# Patient Record
Sex: Female | Born: 1955 | Race: White | Hispanic: No | Smoking: Never smoker
Health system: Southern US, Community
[De-identification: ages and names within clinical notes are randomized; demographics above are authoritative.]

## PROBLEM LIST (undated history)

## (undated) DIAGNOSIS — N814 Uterovaginal prolapse, unspecified: Secondary | ICD-10-CM

## (undated) DIAGNOSIS — G4730 Sleep apnea, unspecified: Secondary | ICD-10-CM

## (undated) DIAGNOSIS — K219 Gastro-esophageal reflux disease without esophagitis: Secondary | ICD-10-CM

## (undated) DIAGNOSIS — E119 Type 2 diabetes mellitus without complications: Secondary | ICD-10-CM

## (undated) DIAGNOSIS — F411 Generalized anxiety disorder: Secondary | ICD-10-CM

## (undated) DIAGNOSIS — G5603 Carpal tunnel syndrome, bilateral upper limbs: Secondary | ICD-10-CM

## (undated) DIAGNOSIS — E785 Hyperlipidemia, unspecified: Secondary | ICD-10-CM

## (undated) DIAGNOSIS — I10 Essential (primary) hypertension: Secondary | ICD-10-CM

## (undated) DIAGNOSIS — Z8719 Personal history of other diseases of the digestive system: Secondary | ICD-10-CM

## (undated) DIAGNOSIS — M19072 Primary osteoarthritis, left ankle and foot: Secondary | ICD-10-CM

## (undated) DIAGNOSIS — D649 Anemia, unspecified: Secondary | ICD-10-CM

## (undated) HISTORY — PX: ANKLE FUSION: SHX881

## (undated) HISTORY — DX: Anemia, unspecified: D64.9

## (undated) HISTORY — DX: Morbid (severe) obesity due to excess calories: E66.01

## (undated) HISTORY — PX: FOOT SURGERY: SHX648

## (undated) HISTORY — DX: Carpal tunnel syndrome, bilateral upper limbs: G56.03

## (undated) HISTORY — DX: Primary osteoarthritis, left ankle and foot: M19.072

## (undated) HISTORY — DX: Essential (primary) hypertension: I10

## (undated) HISTORY — DX: Generalized anxiety disorder: F41.1

## (undated) HISTORY — DX: Hyperlipidemia, unspecified: E78.5

## (undated) HISTORY — PX: EYE SURGERY: SHX253

## (undated) HISTORY — DX: Uterovaginal prolapse, unspecified: N81.4

## (undated) HISTORY — DX: Gastro-esophageal reflux disease without esophagitis: K21.9

---

## 1997-03-09 ENCOUNTER — Ambulatory Visit (HOSPITAL_COMMUNITY): Admission: RE | Admit: 1997-03-09 | Discharge: 1997-03-09 | Payer: Self-pay | Admitting: Obstetrics and Gynecology

## 1998-03-03 ENCOUNTER — Ambulatory Visit (HOSPITAL_COMMUNITY): Admission: RE | Admit: 1998-03-03 | Discharge: 1998-03-03 | Payer: Self-pay | Admitting: Obstetrics and Gynecology

## 1998-03-03 ENCOUNTER — Encounter: Payer: Self-pay | Admitting: Obstetrics and Gynecology

## 1998-03-29 ENCOUNTER — Other Ambulatory Visit: Admission: RE | Admit: 1998-03-29 | Discharge: 1998-03-29 | Payer: Self-pay | Admitting: Obstetrics and Gynecology

## 1999-03-08 ENCOUNTER — Encounter: Payer: Self-pay | Admitting: Obstetrics and Gynecology

## 1999-03-08 ENCOUNTER — Ambulatory Visit (HOSPITAL_COMMUNITY): Admission: RE | Admit: 1999-03-08 | Discharge: 1999-03-08 | Payer: Self-pay | Admitting: Obstetrics and Gynecology

## 1999-05-23 ENCOUNTER — Other Ambulatory Visit: Admission: RE | Admit: 1999-05-23 | Discharge: 1999-05-23 | Payer: Self-pay | Admitting: *Deleted

## 1999-06-14 ENCOUNTER — Other Ambulatory Visit: Admission: RE | Admit: 1999-06-14 | Discharge: 1999-06-14 | Payer: Self-pay | Admitting: *Deleted

## 1999-06-15 ENCOUNTER — Other Ambulatory Visit: Admission: RE | Admit: 1999-06-15 | Discharge: 1999-06-15 | Payer: Self-pay | Admitting: *Deleted

## 1999-06-15 ENCOUNTER — Encounter (INDEPENDENT_AMBULATORY_CARE_PROVIDER_SITE_OTHER): Payer: Self-pay

## 1999-12-30 LAB — HM COLONOSCOPY: HM Colonoscopy: ABNORMAL

## 2000-01-06 ENCOUNTER — Encounter (INDEPENDENT_AMBULATORY_CARE_PROVIDER_SITE_OTHER): Payer: Self-pay | Admitting: Specialist

## 2000-01-06 ENCOUNTER — Other Ambulatory Visit: Admission: RE | Admit: 2000-01-06 | Discharge: 2000-01-06 | Payer: Self-pay | Admitting: Internal Medicine

## 2000-03-14 ENCOUNTER — Encounter: Payer: Self-pay | Admitting: Obstetrics and Gynecology

## 2000-03-14 ENCOUNTER — Ambulatory Visit (HOSPITAL_COMMUNITY): Admission: RE | Admit: 2000-03-14 | Discharge: 2000-03-14 | Payer: Self-pay | Admitting: Obstetrics and Gynecology

## 2000-05-10 ENCOUNTER — Other Ambulatory Visit: Admission: RE | Admit: 2000-05-10 | Discharge: 2000-05-10 | Payer: Self-pay | Admitting: Obstetrics and Gynecology

## 2001-04-03 ENCOUNTER — Ambulatory Visit (HOSPITAL_COMMUNITY): Admission: RE | Admit: 2001-04-03 | Discharge: 2001-04-03 | Payer: Self-pay | Admitting: Obstetrics and Gynecology

## 2001-04-03 ENCOUNTER — Encounter: Payer: Self-pay | Admitting: Obstetrics and Gynecology

## 2002-04-15 ENCOUNTER — Encounter: Payer: Self-pay | Admitting: Obstetrics and Gynecology

## 2002-04-15 ENCOUNTER — Encounter: Admission: RE | Admit: 2002-04-15 | Discharge: 2002-04-15 | Payer: Self-pay | Admitting: Obstetrics and Gynecology

## 2003-04-28 ENCOUNTER — Ambulatory Visit (HOSPITAL_COMMUNITY): Admission: RE | Admit: 2003-04-28 | Discharge: 2003-04-28 | Payer: Self-pay | Admitting: Obstetrics and Gynecology

## 2004-02-08 ENCOUNTER — Ambulatory Visit: Payer: Self-pay | Admitting: Internal Medicine

## 2004-02-12 ENCOUNTER — Ambulatory Visit: Payer: Self-pay | Admitting: Internal Medicine

## 2004-04-12 ENCOUNTER — Ambulatory Visit: Payer: Self-pay | Admitting: Internal Medicine

## 2004-05-13 ENCOUNTER — Ambulatory Visit (HOSPITAL_COMMUNITY): Admission: RE | Admit: 2004-05-13 | Discharge: 2004-05-13 | Payer: Self-pay | Admitting: Obstetrics and Gynecology

## 2005-05-05 ENCOUNTER — Ambulatory Visit: Payer: Self-pay | Admitting: Internal Medicine

## 2006-03-08 ENCOUNTER — Ambulatory Visit: Payer: Self-pay | Admitting: Internal Medicine

## 2006-03-08 LAB — CONVERTED CEMR LAB
ALT: 18 units/L (ref 0–40)
AST: 19 units/L (ref 0–37)
Albumin: 3.1 g/dL — ABNORMAL LOW (ref 3.5–5.2)
Alkaline Phosphatase: 73 units/L (ref 39–117)
BUN: 10 mg/dL (ref 6–23)
Basophils Absolute: 0 10*3/uL (ref 0.0–0.1)
Basophils Relative: 0.5 % (ref 0.0–1.0)
Bilirubin Urine: NEGATIVE
Bilirubin, Direct: 0.2 mg/dL (ref 0.0–0.3)
CO2: 28 meq/L (ref 19–32)
Calcium: 8.9 mg/dL (ref 8.4–10.5)
Chloride: 110 meq/L (ref 96–112)
Cholesterol: 148 mg/dL (ref 0–200)
Creatinine, Ser: 0.7 mg/dL (ref 0.4–1.2)
Crystals: NEGATIVE
Eosinophils Absolute: 0.1 10*3/uL (ref 0.0–0.6)
Eosinophils Relative: 2.1 % (ref 0.0–5.0)
Ferritin: 7.3 ng/mL — ABNORMAL LOW (ref 10.0–291.0)
GFR calc Af Amer: 114 mL/min
GFR calc non Af Amer: 94 mL/min
Glucose, Bld: 102 mg/dL — ABNORMAL HIGH (ref 70–99)
HCT: 34.2 % — ABNORMAL LOW (ref 36.0–46.0)
HDL: 42.2 mg/dL (ref >39.0)
Hemoglobin: 11.2 g/dL — ABNORMAL LOW (ref 12.0–15.0)
Iron: 24 ug/dL — ABNORMAL LOW (ref 42–145)
Ketones, ur: NEGATIVE mg/dL
LDL Cholesterol: 88 mg/dL (ref 0–99)
Leukocytes, UA: NEGATIVE
Lymphocytes Relative: 21.7 % (ref 12.0–46.0)
MCHC: 32.7 g/dL (ref 30.0–36.0)
MCV: 72.1 fL — ABNORMAL LOW (ref 78.0–100.0)
Monocytes Absolute: 0.5 10*3/uL (ref 0.2–0.7)
Monocytes Relative: 7.8 % (ref 3.0–11.0)
Mucus, UA: NEGATIVE
Neutro Abs: 4.9 10*3/uL (ref 1.4–7.7)
Neutrophils Relative %: 67.9 % (ref 43.0–77.0)
Nitrite: NEGATIVE
Platelets: 382 10*3/uL (ref 150–400)
Potassium: 4.3 meq/L (ref 3.5–5.1)
RBC: 4.74 M/uL (ref 3.87–5.11)
RDW: 17.3 % — ABNORMAL HIGH (ref 11.5–14.6)
Saturation Ratios: 6.5 % — ABNORMAL LOW (ref 20.0–50.0)
Sodium: 141 meq/L (ref 135–145)
Specific Gravity, Urine: 1.015 (ref 1.000–1.03)
TSH: 2.74 microintl units/mL (ref 0.35–5.50)
Total Bilirubin: 0.8 mg/dL (ref 0.3–1.2)
Total CHOL/HDL Ratio: 3.5
Total Protein: 6.2 g/dL (ref 6.0–8.3)
Transferrin: 263 mg/dL (ref >=212.0)
Triglycerides: 87 mg/dL (ref 0–149)
Urine Glucose: NEGATIVE mg/dL
Urobilinogen, UA: 0.2 (ref 0.0–1.0)
VLDL: 17 mg/dL (ref 0–40)
WBC: 7 10*3/uL (ref 4.5–10.5)
pH: 7.5 (ref 5.0–8.0)

## 2006-03-12 ENCOUNTER — Ambulatory Visit: Payer: Self-pay | Admitting: Internal Medicine

## 2006-04-04 ENCOUNTER — Ambulatory Visit: Payer: Self-pay | Admitting: Internal Medicine

## 2006-04-11 ENCOUNTER — Encounter (INDEPENDENT_AMBULATORY_CARE_PROVIDER_SITE_OTHER): Payer: Self-pay | Admitting: Specialist

## 2006-04-11 ENCOUNTER — Ambulatory Visit: Payer: Self-pay | Admitting: Internal Medicine

## 2006-11-13 DIAGNOSIS — K219 Gastro-esophageal reflux disease without esophagitis: Secondary | ICD-10-CM | POA: Insufficient documentation

## 2006-11-13 DIAGNOSIS — I10 Essential (primary) hypertension: Secondary | ICD-10-CM | POA: Insufficient documentation

## 2006-11-13 DIAGNOSIS — D509 Iron deficiency anemia, unspecified: Secondary | ICD-10-CM | POA: Insufficient documentation

## 2006-11-13 HISTORY — DX: Essential (primary) hypertension: I10

## 2007-05-01 ENCOUNTER — Telehealth: Payer: Self-pay | Admitting: Internal Medicine

## 2007-05-14 ENCOUNTER — Ambulatory Visit: Payer: Self-pay | Admitting: Internal Medicine

## 2007-05-14 DIAGNOSIS — J069 Acute upper respiratory infection, unspecified: Secondary | ICD-10-CM | POA: Insufficient documentation

## 2007-10-30 ENCOUNTER — Ambulatory Visit: Payer: Self-pay | Admitting: Internal Medicine

## 2007-10-30 DIAGNOSIS — R5383 Other fatigue: Secondary | ICD-10-CM

## 2007-10-30 DIAGNOSIS — R5381 Other malaise: Secondary | ICD-10-CM | POA: Insufficient documentation

## 2007-10-30 LAB — CONVERTED CEMR LAB
ALT: 12 units/L (ref 0–35)
AST: 14 units/L (ref 0–37)
Albumin: 3.6 g/dL (ref 3.5–5.2)
Alkaline Phosphatase: 67 units/L (ref 39–117)
BUN: 19 mg/dL (ref 6–23)
Basophils Absolute: 0 10*3/uL (ref 0.0–0.1)
Basophils Relative: 0.2 % (ref 0.0–3.0)
Bilirubin Urine: NEGATIVE
Bilirubin, Direct: 0.2 mg/dL (ref 0.0–0.3)
CO2: 29 meq/L (ref 19–32)
Calcium: 9 mg/dL (ref 8.4–10.5)
Chloride: 109 meq/L (ref 96–112)
Creatinine, Ser: 0.8 mg/dL (ref 0.4–1.2)
Crystals: NEGATIVE
Eosinophils Absolute: 0.2 10*3/uL (ref 0.0–0.7)
Eosinophils Relative: 2.2 % (ref 0.0–5.0)
GFR calc Af Amer: 97 mL/min
GFR calc non Af Amer: 80 mL/min
Glucose, Bld: 98 mg/dL (ref 70–99)
HCT: 45.3 % (ref 36.0–46.0)
Hemoglobin, Urine: NEGATIVE
Hemoglobin: 15.2 g/dL — ABNORMAL HIGH (ref 12.0–15.0)
Ketones, ur: NEGATIVE mg/dL
Lymphocytes Relative: 29.3 % (ref 12.0–46.0)
MCHC: 33.6 g/dL (ref 30.0–36.0)
MCV: 91.6 fL (ref 78.0–100.0)
Monocytes Absolute: 0.5 10*3/uL (ref 0.1–1.0)
Monocytes Relative: 7.1 % (ref 3.0–12.0)
Mucus, UA: NEGATIVE
Neutro Abs: 4.7 10*3/uL (ref 1.4–7.7)
Neutrophils Relative %: 61.2 % (ref 43.0–77.0)
Nitrite: NEGATIVE
Platelets: 291 10*3/uL (ref 150–400)
Potassium: 3.8 meq/L (ref 3.5–5.1)
RBC / HPF: NONE SEEN
RBC: 4.95 M/uL (ref 3.87–5.11)
RDW: 13.3 % (ref 11.5–14.6)
Sodium: 144 meq/L (ref 135–145)
Specific Gravity, Urine: 1.02 (ref 1.000–1.03)
TSH: 2.14 microintl units/mL (ref 0.35–5.50)
Total Bilirubin: 1.1 mg/dL (ref 0.3–1.2)
Total Protein, Urine: NEGATIVE mg/dL
Total Protein: 6.7 g/dL (ref 6.0–8.3)
Urine Glucose: NEGATIVE mg/dL
Urobilinogen, UA: 1 (ref 0.0–1.0)
WBC: 7.7 10*3/uL (ref 4.5–10.5)
pH: 6.5 (ref 5.0–8.0)

## 2007-11-04 ENCOUNTER — Telehealth (INDEPENDENT_AMBULATORY_CARE_PROVIDER_SITE_OTHER): Payer: Self-pay | Admitting: *Deleted

## 2009-07-22 ENCOUNTER — Ambulatory Visit: Payer: Self-pay | Admitting: Internal Medicine

## 2009-07-22 LAB — CONVERTED CEMR LAB
BUN: 17 mg/dL (ref 6–23)
Calcium: 8.9 mg/dL (ref 8.4–10.5)
Creatinine, Ser: 0.9 mg/dL (ref 0.4–1.2)
GFR calc non Af Amer: 68.46 mL/min (ref >60)
Glucose, Bld: 101 mg/dL — ABNORMAL HIGH (ref 70–99)

## 2010-02-24 NOTE — Assessment & Plan Note (Signed)
Summary: ROV-MED REFILL-LAST APPT W/DR JWJ:2009-STC   Vital Signs:  Patient profile:   55 year old female Height:      66 inches Weight:      348.25 pounds BMI:     56.41 O2 Sat:      97 % on Room air Temp:     98.2 degrees F oral Pulse rate:   88 / minute BP sitting:   148 / 92  (left arm) Cuff size:   large  Vitals Entered By: Zella Ball Ewing CMA Duncan Dull) (July 22, 2009 2:34 PM)  O2 Flow:  Room air  CC: ROV, Medication refills/RE   Primary Care Provider:  Oliver Barre  CC:  ROV and Medication refills/RE.  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  Wt still elevated, hard to lose.  Denies OSA symptoms.  Pt denies CP, sob, doe, wheezing, orthopnea, pnd, worsening LE edema, palps, dizziness or syncope  Pt denies new neuro symptoms such as headache, facial or extremity weakness    Out of meds for several days.  BP at home usualy < 140/90  Problems Prior to Update: 1)  Fatigue  (ICD-780.79) 2)  Uri  (ICD-465.9) 3)  Hypertension  (ICD-401.9) 4)  Anemia-iron Deficiency  (ICD-280.9) 5)  Gerd  (ICD-530.81) 6)  Morbid Obesity  (ICD-278.01)  Medications Prior to Update: 1)  Benazepril Hcl 20 Mg  Tabs (Benazepril Hcl) .... Take 1 Tablet By Mouth Once A Day 2)  Omeprazole 20 Mg Tbec (Omeprazole) .[name].. 1 By Mouth Once Daily 3)  Klor-Con M10 10 Meq Cr-Tabs (Potassium Chloride Crys Cr) .[name].. 1 By Mouth Once Daily 4)  Furosemide 40 Mg  Tabs (Furosemide) .... Once A Week As Needed 5)  Adult Aspirin Ec Low Strength 81 Mg Tbec (Aspirin) .[name].. 1 By Mouth Once Daily  Current Medications (verified): 1)  Benazepril Hcl 20 Mg  Tabs (Benazepril Hcl) .... Take 1 Tablet By Mouth Once A Day 2)  Omeprazole 20 Mg Tbec (Omeprazole) .[name].. 1 By Mouth Once Daily 3)  Klor-Con M10 10  Meq Cr-Tabs (Potassium Chloride Crys Cr) .[name].. 1 By Mouth Once Daily 4)  Furosemide 40 Mg  Tabs (Furosemide) .[name].. 1 By Mouth Once Daily As Needed 5)  Adult Aspirin Ec Low Strength 81 Mg Tbec (Aspirin) .[name].. 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   Head:  normocephalic and atraumatic.   Eyes:  vision grossly intact, pupils equal, and pupils round.   Ears:  R ear normal and L ear normal.   Nose:  no external deformity and no nasal discharge.   Mouth:  no gingival abnormalities and pharynx pink and moist.   Neck:  supple and no masses.  Lungs:  normal respiratory effort and normal breath sounds.   Heart:  normal rate and regular rhythm.   Abdomen:  soft, non-tender, and normal bowel sounds.   Msk:  no joint tenderness and no joint swelling.   Extremities:  no edema, no erythema  Neurologic:  alert & oriented X3 and strength normal in all extremities.   Skin:  color normal and no rashes.   Psych:  not depressed appearing and slightly anxious.     Impression & Recommendations:  Problem # 1:  HYPERTENSION (ICD-401.9)  Her updated medication list for this problem includes:    Benazepril Hcl 20 Mg Tabs (Benazepril hcl) .[name]... Take 1 tablet by mouth once a day    Furosemide 40 Mg Tabs (Furosemide) .[name]... 1 by mouth once  daily as needed  Orders: TLB-BMP (Basic Metabolic Panel-BMET) (80048-METABOL)  BP today: 148/92 Prior BP: 140/92 (10/30/2007)  Labs Reviewed: K+: 3.8 (10/30/2007) Creat: : 0.8 (10/30/2007)   Chol: 148 (03/08/2006)   HDL: 42.2 (03/08/2006)   LDL: 88 (03/08/2006)   TG: 87 (03/08/2006) to re-start meds, .Continue all previous medications as before this visit,  , cont to check BP at home carefully, check BMP today  Complete Medication List: 1)  Benazepril Hcl 20 Mg Tabs (Benazepril hcl) .... Take 1 tablet by mouth once a day 2)  Omeprazole 20 Mg Tbec (Omeprazole) .[name].. 1 by mouth once daily 3)  Klor-con M10 10 Meq Cr-tabs (Potassium chloride crys cr) .[name].. 1 by mouth once daily 4)  Furosemide 40 Mg Tabs (Furosemide) .[name].. 1 by mouth once daily as needed 5)  Adult Aspirin Ec Low Strength 81 Mg Tbec (Aspirin) .[name].. 1 by mouth once daily  Patient Instructions: 1)  please call for yearly mammogram  - consider [facility] on [address] 2)  Please go to the Lab in the basement for your blood and/or urine tests today  3)  Continue all previous medications as before this visit  4)  Please schedule a follow-up appointment in 1 year or sooner if needed Prescriptions: KLOR-CON M10 10 MEQ CR-TABS (POTASSIUM CHLORIDE CRYS CR) 1 by mouth once daily  #90 x 3   Entered and Authorized by:   Corwin Levins MD   Signed by:   Corwin Levins MD on 07/22/2009   Method used:   Electronically to        [id number]* (retail)       [address]       [place], [place]  [address]       Ph: [phone]       Fax: [phone]   RxID:   [id number] OMEPRAZOLE 20 MG TBEC (OMEPRAZOLE) 1 by mouth once daily  #90 x 3   Entered and Authorized by:   Corwin Levins MD   Signed by:   Corwin Levins MD on 07/22/2009   Method used:   Electronically to        [id number]* (retail)       [address]       [place], [place]  [address]       Ph: [phone]       Fax:  [phone]   RxID:   [id number] BENAZEPRIL HCL 20 MG  TABS (BENAZEPRIL HCL) Take 1 tablet by mouth once a day  #90 x 3   Entered and Authorized by:   Corwin Levins MD   Signed by:   Corwin Levins MD  on 07/22/2009   Method used:   Electronically to        [facility] [address]* (retail)       [address]       [place], [place]  [address]       Ph: [phone]       Fax: [phone]   RxID:   [id number] FUROSEMIDE 40 MG  TABS (FUROSEMIDE) 1 by mouth once daily as needed  #90 x 3   Entered and Authorized by:   Corwin Levins MD   Signed by:   Corwin Levins MD on 07/22/2009   Method used:   Electronically to        [facility] [address] (retail)       [address]       [place], [place]  [address]       Ph: [phone]       Fax: [phone]   RxID:   [id number]

## 2010-06-10 NOTE — Assessment & Plan Note (Signed)
[facility]                         GASTROENTEROLOGY OFFICE NOTE   Jodi, Keller                         MRN:          [id number]  DATE:04/04/2006                            DOB:          1955-04-06    REASON FOR CONSULTATION:  Iron deficiency anemia.   HISTORY:  This is a 55 year old white female with history of  gastroesophageal reflux disease and morbid obesity, and hypertension,  who is referred through the courtesy of Dr. Jonny Ruiz regarding iron  deficiency anemia.  The patient was evaluated in October of 2001 for  iron deficiency anemia.  At that time, hemoccult studies were negative.  Her symptoms were felt secondary to menstruation.  She did, however,  undergo colonoscopy and upper endoscopy.  Colonoscopy performed December 30, 1999, was normal with grade 1 internal hemorrhoids noted.  Upper  endoscopy revealed reflux esophagitis and a peptic stricture.  Biopsies  of the duodenum were negative for sprue.  She was placed on Nexium and  iron.  Her blood counts improved as documented in the chart, with more  normal hemoglobins in the 13 range.  However, over the past few years,  she has had mild anemia with a hemoglobin in the 11 range.  On March 08, 2006, her hemoglobin was 11.2.  MCV 72.1.  Iron studies were  consistent with iron deficiency with a ferritin level of 7.3 and an iron  saturation of 6.5%.  She is now referred.  The patient continues on  Nexium daily.  On the medication, she has no problems with heartburn or  dysphagia.  No abdominal pain.  She denies change in her bowel habits,  melena, hematochezia, or weight loss.   PAST MEDICAL HISTORY:  None.   PAST SURGICAL HISTORY:  1. Caesarean section.  2. Left foot surgery.   CURRENT MEDICATIONS:  1. Diovan.  2. Nexium.  3. Lasix.  4. Potassium.  5. Iron supplement.   ALLERGIES:  NO KNOWN DRUG ALLERGIES.   FAMILY HISTORY:  No family history of gastrointestinal  malignancy.   SOCIAL HISTORY:  The patient is divorced with 2 children.  She is  employed with [employer].  She does not smoke and occasionally uses  alcohol.   REVIEW OF SYSTEMS:  Per diagnostic evaluation form.   PHYSICAL EXAMINATION:  Well-appearing female in no acute distress.  Blood pressure is 138/78, heart rate is 72 and regular, weight is 334  pounds.  She is 5 feet 6 inches in height.  HEENT:  Sclerae anicteric.  Conjunctivae are pink.  Oral mucosa intact.  No adenopathy.  Lungs are clear.  Heart is regular.  ABDOMEN:  Obese and soft without tenderness, mass, or hernia.  Good  bowel sounds heard.   IMPRESSION:  1. Recurrent iron deficiency anemia.  Most likely due to chronic blood      loss from menstruation.  2. Gastroesophageal reflux disease.  History of erosive esophagitis      and stricture on endoscopy in 2001.  Currently asymptomatic on      Nexium.  3. Negative colonoscopy in 2001.   RECOMMENDATIONS:  1. Continue Nexium for reflux symptoms.  2. Continue iron for iron deficiency anemia.  3. Schedule colonoscopy to provide neoplasia screening in this patient      with iron deficiency.  Ashby Dawes of the procedures as well as the      risks, benefits, and alternatives have been reviewed.  She      understood and agreed to proceed.     Wilhemina Bonito. Marina Goodell, MD  Electronically Signed    JNP/MedQ  DD: 04/04/2006  DT: 04/06/2006  Job #: [id number]   cc:   Corwin Levins, MD

## 2010-08-20 ENCOUNTER — Other Ambulatory Visit: Payer: Self-pay | Admitting: Internal Medicine

## 2010-10-10 ENCOUNTER — Other Ambulatory Visit: Payer: Self-pay | Admitting: Internal Medicine

## 2010-11-17 ENCOUNTER — Other Ambulatory Visit: Payer: Self-pay | Admitting: Internal Medicine

## 2010-11-18 NOTE — Telephone Encounter (Signed)
Called the patient informed needs to schedule OV as is overdue. The patient agreed and did schedule appointment.

## 2010-12-10 ENCOUNTER — Encounter: Payer: Self-pay | Admitting: Internal Medicine

## 2010-12-10 DIAGNOSIS — Z0000 Encounter for general adult medical examination without abnormal findings: Secondary | ICD-10-CM | POA: Insufficient documentation

## 2010-12-10 DIAGNOSIS — Z1211 Encounter for screening for malignant neoplasm of colon: Secondary | ICD-10-CM | POA: Insufficient documentation

## 2010-12-16 ENCOUNTER — Encounter: Payer: Self-pay | Admitting: Internal Medicine

## 2010-12-16 ENCOUNTER — Other Ambulatory Visit (INDEPENDENT_AMBULATORY_CARE_PROVIDER_SITE_OTHER): Payer: BC Managed Care – PPO

## 2010-12-16 ENCOUNTER — Ambulatory Visit (INDEPENDENT_AMBULATORY_CARE_PROVIDER_SITE_OTHER): Payer: BC Managed Care – PPO | Admitting: Internal Medicine

## 2010-12-16 VITALS — BP 138/90 | HR 82 | Temp 98.1°F | Wt 337.2 lb

## 2010-12-16 DIAGNOSIS — Z0000 Encounter for general adult medical examination without abnormal findings: Secondary | ICD-10-CM

## 2010-12-16 DIAGNOSIS — M19079 Primary osteoarthritis, unspecified ankle and foot: Secondary | ICD-10-CM | POA: Insufficient documentation

## 2010-12-16 DIAGNOSIS — G5603 Carpal tunnel syndrome, bilateral upper limbs: Secondary | ICD-10-CM

## 2010-12-16 DIAGNOSIS — M19072 Primary osteoarthritis, left ankle and foot: Secondary | ICD-10-CM

## 2010-12-16 HISTORY — DX: Carpal tunnel syndrome, bilateral upper limbs: G56.03

## 2010-12-16 HISTORY — DX: Primary osteoarthritis, left ankle and foot: M19.072

## 2010-12-16 LAB — LIPID PANEL
HDL: 56 mg/dL (ref >39.00)
VLDL: 12.8 mg/dL (ref 0.0–40.0)

## 2010-12-16 LAB — CBC WITH DIFFERENTIAL/PLATELET
Basophils Relative: 0.4 % (ref 0.0–3.0)
Eosinophils Absolute: 0.2 10*3/uL (ref 0.0–0.7)
Eosinophils Relative: 2.5 % (ref 0.0–5.0)
HCT: 44.9 % (ref 36.0–46.0)
Lymphs Abs: 1.7 10*3/uL (ref 0.7–4.0)
MCHC: 33.1 g/dL (ref 30.0–36.0)
MCV: 92.9 fl (ref 78.0–100.0)
Monocytes Absolute: 0.5 10*3/uL (ref 0.1–1.0)
Neutrophils Relative %: 64.5 % (ref 43.0–77.0)
Platelets: 237 10*3/uL (ref 150.0–400.0)
RBC: 4.84 Mil/uL (ref 3.87–5.11)
WBC: 6.6 10*3/uL (ref 4.5–10.5)

## 2010-12-16 LAB — URINALYSIS, ROUTINE W REFLEX MICROSCOPIC
Bilirubin Urine: NEGATIVE
Ketones, ur: NEGATIVE
Total Protein, Urine: NEGATIVE
Urine Glucose: NEGATIVE
pH: 6 (ref 5.0–8.0)

## 2010-12-16 LAB — BASIC METABOLIC PANEL
BUN: 17 mg/dL (ref 6–23)
Calcium: 8.7 mg/dL (ref 8.4–10.5)
Creatinine, Ser: 0.8 mg/dL (ref 0.4–1.2)
GFR: 83.84 mL/min (ref >60.00)
Glucose, Bld: 95 mg/dL (ref 70–99)
Sodium: 143 mEq/L (ref 135–145)

## 2010-12-16 LAB — HEPATIC FUNCTION PANEL
ALT: 16 U/L (ref 0–35)
AST: 19 U/L (ref 0–37)
Bilirubin, Direct: 0.2 mg/dL (ref 0.0–0.3)
Total Bilirubin: 0.9 mg/dL (ref 0.3–1.2)

## 2010-12-16 MED ORDER — BENAZEPRIL HCL 20 MG PO TABS: 20.0000 mg | ORAL_TABLET | Freq: Every day | ORAL | Status: DC

## 2010-12-16 MED ORDER — NAPROXEN 500 MG PO TABS: 500.0000 mg | ORAL_TABLET | Freq: Two times a day (BID) | ORAL | Status: DC

## 2010-12-16 MED ORDER — FUROSEMIDE 40 MG PO TABS: 40.0000 mg | ORAL_TABLET | Freq: Every day | ORAL | Status: DC

## 2010-12-16 MED ORDER — OMEPRAZOLE 20 MG PO CPDR: 20.0000 mg | DELAYED_RELEASE_CAPSULE | Freq: Every day | ORAL | Status: DC

## 2010-12-16 MED ORDER — POTASSIUM CHLORIDE CRYS ER 10 MEQ PO TBCR: 10.0000 meq | EXTENDED_RELEASE_TABLET | Freq: Every day | ORAL | Status: DC

## 2010-12-16 NOTE — Patient Instructions (Addendum)
You had the flu shot today Please remember to followup with your GYN for the yearly pap smear and/or mammogram Take all new medications as prescribed Continue all other medications as before You will be contacted regarding the referral for: orthopedic for the left ankle Please go to LAB in the Basement for the blood and/or urine tests to be done today Please call the phone number [phone] (the PhoneTree System) for results of testing in 2-3 days;  When calling, simply dial the number, and when prompted enter the MRN number above (the Medical Record Number) and the # key, then the message should start. Please return in 1 year for your yearly visit, or sooner if needed, with Lab testing done 3-5 days before

## 2010-12-16 NOTE — Progress Notes (Signed)
Subjective:    Patient ID: Jodi Keller, female    DOB: Dec 22, 1955, 55 y.o.   MRN: [id number]  HPI  Here for wellness and f/u;  Overall doing ok;  Pt denies CP, worsening SOB, DOE, wheezing, orthopnea, PND, worsening LE edema, palpitations, dizziness or syncope.  Pt denies neurological change such as new Headache, facial or extremity weakness.  Pt denies polydipsia, polyuria, or low sugar symptoms. Pt states overall good compliance with treatment and medications, good tolerability, and trying to follow lower cholesterol diet.  Pt denies worsening depressive symptoms, suicidal ideation or panic. No fever, wt loss, night sweats, loss of appetite, or other constitutional symptoms.  Pt states good ability with ADL's, low fall risk, home safety reviewed and adequate, no significant changes in hearing or vision, and occasionally active with exercise.  Does have signficant ongoing but gradually worsening in the past yr of left ankle pain/swelling. Past Medical History  Diagnosis Date  . Hypertension   . Anemia   . GERD (gastroesophageal reflux disease)   . Morbid obesity   . Uterine prolapse   . Bilateral carpal tunnel syndrome 12/16/2010  . 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 Cesarean section   . Foot surgery     reports that she has never smoked. She does not have any smokeless tobacco history on file. She reports that she drinks alcohol. Her drug history not on file. family history includes Cancer in her father and sisters; Diabetes in her maternal grandmother; and Thyroid disease in her other. No Known Allergies Current Outpatient Prescriptions on File Prior to Visit  Medication Sig Dispense Refill  . aspirin 81 MG tablet Take 81 mg by mouth daily.         Review of Systems Review of Systems  Constitutional: Negative for diaphoresis, activity change, appetite change and unexpected weight change.  HENT: Negative for hearing loss,  ear pain, facial swelling, mouth sores and neck stiffness.   Eyes: Negative for pain, redness and visual disturbance.  Respiratory: Negative for shortness of breath and wheezing.   Cardiovascular: Negative for chest pain and palpitations.  Gastrointestinal: Negative for diarrhea, blood in stool, abdominal distention and rectal pain.  Genitourinary: Negative for hematuria, flank pain and decreased urine volume.  Musculoskeletal: Negative for myalgias and joint swelling.  Skin: Negative for color change and wound.  Neurological: Negative for syncope and numbness.  Hematological: Negative for adenopathy.  Psychiatric/Behavioral: Negative for hallucinations, self-injury, decreased concentration and agitation.      Objective:   Physical Exam BP 138/90  Pulse 82  Temp(Src) 98.1 F (36.7 C) (Oral)  Wt 337 lb 4 oz (152.976 kg)  SpO2 96% Physical Exam  VS noted Constitutional: Pt is oriented to person, place, and time. Appears well-developed and well-nourished.  HENT:  Head: Normocephalic and atraumatic.  Right Ear: External ear normal.  Left Ear: External ear normal.  Nose: Nose normal.  Mouth/Throat: Oropharynx is clear and moist.  Eyes: Conjunctivae and EOM are normal. Pupils are equal, round, and reactive to light.  Neck: Normal range of motion. Neck supple. No JVD present. No tracheal deviation present.  Cardiovascular: Normal rate, regular rhythm, normal heart sounds and intact distal pulses.   Pulmonary/Chest: Effort normal and breath sounds normal.  Abdominal: Soft. Bowel sounds are normal. There is no tenderness.  Musculoskeletal: Normal range of motion. Exhibits no edema.  Lymphadenopathy:  Has no cervical adenopathy.  Neurological: Pt is alert and oriented  to person, place, and time. Pt has normal reflexes. No cranial nerve deficit.  Skin: Skin is warm and dry. No rash noted.  Psychiatric:  Has  normal mood and affect. Behavior is normal.  Left ankle with 1+ effusion, bony  degenerative change    Assessment & Plan:

## 2010-12-16 NOTE — Assessment & Plan Note (Signed)
With chronic pain, swelling s/p prior surgury, now overall worsening, wt may be a factor;  For nsaid prn, and refer to ortho

## 2010-12-17 ENCOUNTER — Encounter: Payer: Self-pay | Admitting: Internal Medicine

## 2011-01-04 ENCOUNTER — Ambulatory Visit: Payer: Self-pay | Admitting: Internal Medicine

## 2011-02-24 ENCOUNTER — Encounter: Payer: Self-pay | Admitting: Internal Medicine

## 2011-02-24 ENCOUNTER — Ambulatory Visit (INDEPENDENT_AMBULATORY_CARE_PROVIDER_SITE_OTHER): Payer: BC Managed Care – PPO | Admitting: Internal Medicine

## 2011-02-24 VITALS — BP 102/70 | HR 80 | Temp 97.0°F | Ht 67.0 in | Wt 334.5 lb

## 2011-02-24 DIAGNOSIS — M79604 Pain in right leg: Secondary | ICD-10-CM

## 2011-02-24 DIAGNOSIS — M79609 Pain in unspecified limb: Secondary | ICD-10-CM

## 2011-02-24 DIAGNOSIS — R21 Rash and other nonspecific skin eruption: Secondary | ICD-10-CM

## 2011-02-24 DIAGNOSIS — E785 Hyperlipidemia, unspecified: Secondary | ICD-10-CM

## 2011-02-24 DIAGNOSIS — I10 Essential (primary) hypertension: Secondary | ICD-10-CM

## 2011-02-24 MED ORDER — CLOTRIMAZOLE-BETAMETHASONE 1-0.05 % EX CREA: TOPICAL_CREAM | CUTANEOUS | Status: DC

## 2011-02-24 MED ORDER — TRAMADOL HCL 50 MG PO TABS: 50.0000 mg | ORAL_TABLET | Freq: Four times a day (QID) | ORAL | Status: AC | PRN

## 2011-02-24 MED ORDER — PREDNISONE 10 MG PO TABS: ORAL_TABLET | ORAL | Status: DC

## 2011-02-24 NOTE — Patient Instructions (Signed)
Take all new medications as prescribed Continue all other medications as before Please call in 1-2 weeks if not improved for orthopedic referral Please follow lower cholesterol diet

## 2011-02-25 ENCOUNTER — Encounter: Payer: Self-pay | Admitting: Internal Medicine

## 2011-02-25 DIAGNOSIS — E785 Hyperlipidemia, unspecified: Secondary | ICD-10-CM

## 2011-02-25 HISTORY — DX: Hyperlipidemia, unspecified: E78.5

## 2011-02-25 NOTE — Assessment & Plan Note (Signed)
For lower chol diet, o/w stable overall by hx and exam, most recent data reviewed with pt, and pt to continue medical treatment as before Lab Results  Component Value Date   LDLCALC 103* 12/16/2010

## 2011-02-25 NOTE — Assessment & Plan Note (Signed)
stable overall by hx and exam, most recent data reviewed with pt, and pt to continue medical treatment as before  BP Readings from Last 3 Encounters:  02/24/11 102/70  12/16/10 138/90  07/22/09 148/92

## 2011-02-25 NOTE — Progress Notes (Signed)
Subjective:    Patient ID: Jodi Keller, female    DOB: 1956-01-04, 56 y.o.   MRN: [id number]  HPI  Here with severe pain to right post leg, ongoing now for 4 wks, mod to severe but essentially only symptomatic with trying to stand up, walking and standing, very little to none with sitting, not better with current meds,  Also c/o recurrent rash to the rlq area of the abd, ithches, not better with OTC med, not worse to anything, mild, more than several months, not better with anything.  Pt denies chest pain, increased sob or doe, wheezing, orthopnea, PND, increased LE swelling, palpitations, dizziness or syncope.  Pt denies new neurological symptoms such as new headache, or facial or extremity weakness or numbness   Pt denies polydipsia, polyuria, not really trying to follow lower chol diet recen Past Medical History  Diagnosis Date  . Hypertension   . Anemia   . GERD (gastroesophageal reflux disease)   . Morbid obesity   . Uterine prolapse   . Bilateral carpal tunnel syndrome 12/16/2010  . 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 Cesarean section   . Foot surgery     reports that she has never smoked. She does not have any smokeless tobacco history on file. She reports that she drinks alcohol. Her drug history not on file. family history includes Cancer in her father and sisters; Diabetes in her maternal grandmother; and Thyroid disease in her other. No Known Allergies Current Outpatient Prescriptions on File Prior to Visit  Medication Sig Dispense Refill  . benazepril (LOTENSIN) 20 MG tablet Take 1 tablet (20 mg total) by mouth daily.  90 tablet  3  . omeprazole (PRILOSEC) 20 MG capsule Take 1 capsule (20 mg total) by mouth daily.  90 capsule  3  . potassium chloride (K-DUR,KLOR-CON) 10 MEQ tablet Take 1 tablet (10 mEq total) by mouth daily.  90 tablet  3  . aspirin 81 MG tablet Take 81 mg by mouth daily.        . furosemide  (LASIX) 40 MG tablet Take 1 tablet (40 mg total) by mouth daily.  90 tablet  3   Review of Systems Review of Systems  Constitutional: Negative for diaphoresis and unexpected weight change.  HENT: Negative for drooling and tinnitus.   Eyes: Negative for photophobia and visual disturbance.  Respiratory: Negative for choking and stridor.   Gastrointestinal: Negative for vomiting and blood in stool.  Genitourinary: Negative for hematuria and decreased urine volume.  Musculoskeletal: Negative for gait problem.  Skin: Negative for color change and wound.  Neurological: Negative for tremors and numbness.  Psychiatric/Behavioral: Negative for decreased concentration. The patient is not hyperactive.      Objective:   Physical Exam BP 102/70  Pulse 80  Temp(Src) 97 F (36.1 C) (Oral)  Ht 5\' 7"  (1.702 m)  Wt 334 lb 8 oz (151.728 kg)  BMI 52.39 kg/m2  SpO2 98% Physical Exam  VS noted Constitutional: Pt appears well-developed and well-nourished.  HENT: Head: Normocephalic.  Right Ear: External ear normal.  Left Ear: External ear normal.  Eyes: Conjunctivae and EOM are normal. Pupils are equal, round, and reactive to light.  Neck: Normal range of motion. Neck supple.  Cardiovascular: Normal rate and regular rhythm.   Pulmonary/Chest: Effort normal and breath sounds normal.  Abd:  Soft, NT, ND, +BS, Neurological: Pt is alert. No cranial nerve deficit.  Skin: Skin is  warm. No erythema.except for eczematous type nontender rahs 2 cm area RLQ   Psychiatric: Pt behavior is normal. Thought content normal.  Right leg with marked tender to medial post ligamentous complex at the knee insertion site Right knee with FROM, NT, no effusion, RLE o/w neurovasc intact    Assessment & Plan:

## 2011-02-25 NOTE — Assessment & Plan Note (Signed)
Mild to mod, for lotrisone course,  to f/u any worsening symptoms or concerns

## 2011-02-25 NOTE — Assessment & Plan Note (Signed)
C/w tendonitis, for pain med, predpack asd, consider ortho f/u if not improved 1-2 wks

## 2011-02-27 ENCOUNTER — Telehealth: Payer: Self-pay

## 2011-02-27 NOTE — Telephone Encounter (Signed)
Ok for care as per call a nurse, but if swelling, pain or instability persist after 2-3 days she should make OV or consider ortho eval (I could refer)

## 2011-02-27 NOTE — Telephone Encounter (Signed)
[facility] Triage Call Report Triage Record Num: [id number] Operator: Freddie Breech Patient Name: Jodi Keller Call Date & Time: 02/26/2011 2:32:05PM Patient Phone: [phone] PCP: Oliver Barre Patient Gender: Female PCP Fax : [phone] Patient DOB: 1955/03/01 [facility] Name: [facility] Reason for Call: Caller: [name]/Patient; PCP: Oliver Barre; CB#: [phone]; Call Reason: Injury/Trauma; Sx Onset: 02/25/2011; Sx Notes: C/o hearing a pop, R knee pain after going up her stairs on 02/25/11. Ambulatory. Taking left over Prednisone for a R knee injury sustained on 01/23/11. She feels this is a new injury. Guideline Used:Knee injury ; Disp:See in 24 hrs; Appt Scheduled?: No Protocol(s) Used: Knee Injury Recommended Outcome per Protocol: See Provider within 24 hours Reason for Outcome: New or worsening swelling or pain for at least 24 hours that has not improved with home care Care Advice: ~ SYMPTOM / CONDITION MANAGEMENT Analgesic/Antipyretic Advice - Acetaminophen: Consider acetaminophen as directed on label or by pharmacist/provider for pain or fever PRECAUTIONS: - Use if there is no history of liver disease, alcoholism, or intake of three or more alcohol drinks per day - Only if approved by provider during pregnancy or when breastfeeding - During pregnancy, acetaminophen should not be taken more than 3 consecutive days without telling provider - Do not exceed recommended dose or frequency ~ To limit swelling and reduce pain: - Protect the knee and prevent movement by limiting weight bearing, especially going down stairs, strenuous exercise, or any activity that makes the pain worse. - Apply a cloth-covered ice pack to the extremity for no more than 20 minutes, 4 to 8 times a day. Ice helps relieve pain and swelling. - Apply an elastic bandage to the extremity to limit the swelling. Do not wrap extremity too tightly. - Elevate the extremity above the level of the heart. ~ Call  provider immediately to report: - increasing joint instability (joint gives out, feels wobbly) - return of pain or swelling after decreasing following the injury - re-injury ~ 02/26/2011 2:41:15PM Page 1 of 1 [email or web address]

## 2011-02-27 NOTE — Telephone Encounter (Signed)
Patient informed. 

## 2011-03-21 ENCOUNTER — Encounter: Payer: Self-pay | Admitting: Obstetrics and Gynecology

## 2011-03-21 ENCOUNTER — Encounter (INDEPENDENT_AMBULATORY_CARE_PROVIDER_SITE_OTHER): Payer: BC Managed Care – PPO | Admitting: Obstetrics and Gynecology

## 2011-03-21 DIAGNOSIS — N926 Irregular menstruation, unspecified: Secondary | ICD-10-CM

## 2011-03-27 ENCOUNTER — Other Ambulatory Visit: Payer: Self-pay | Admitting: Obstetrics and Gynecology

## 2011-03-30 ENCOUNTER — Encounter (HOSPITAL_COMMUNITY): Payer: Self-pay

## 2011-04-06 ENCOUNTER — Encounter (HOSPITAL_COMMUNITY)
Admission: RE | Admit: 2011-04-06 | Discharge: 2011-04-06 | Disposition: A | Discharge status: Home / Self Care | Payer: BC Managed Care – PPO | Source: Ambulatory Visit | Attending: Obstetrics and Gynecology | Admitting: Obstetrics and Gynecology

## 2011-04-06 ENCOUNTER — Other Ambulatory Visit: Payer: Self-pay

## 2011-04-06 ENCOUNTER — Encounter (HOSPITAL_COMMUNITY): Payer: Self-pay

## 2011-04-06 LAB — BASIC METABOLIC PANEL
BUN: 18 mg/dL (ref 6–23)
CO2: 26 mEq/L (ref 19–32)
Calcium: 9.5 mg/dL (ref 8.4–10.5)
GFR calc non Af Amer: 72 mL/min — ABNORMAL LOW (ref >90)
Glucose, Bld: 124 mg/dL — ABNORMAL HIGH (ref 70–99)
Sodium: 139 mEq/L (ref 135–145)

## 2011-04-06 LAB — CBC
MCH: 30.5 pg (ref 26.0–34.0)
MCHC: 32.9 g/dL (ref 30.0–36.0)
MCV: 92.8 fL (ref 78.0–100.0)
Platelets: 244 10*3/uL (ref 150–400)
RBC: 4.98 MIL/uL (ref 3.87–5.11)

## 2011-04-06 NOTE — Patient Instructions (Signed)
   Your procedure is scheduled on: Thursday March 21  Enter through the [employer] of [facility] at: 10:30 Pick up the phone at the desk and dial [phone] and inform us of your arrival.  Please call this number if you have any problems the morning of surgery: [phone]  Remember: Do not eat food after midnight: Do not drink clear liquids after: Take these medicines the morning of surgery with a SIP OF WATER:  Benzapril the night before your surgery as usual Prilosec the morning of your surgery  Do not wear jewelry, make-up, or FINGER nail polish Do not wear lotions, powders, perfumes or deodorant. Do not shave 48 hours prior to surgery. Do not bring valuables to the hospital. Contacts, dentures or bridgework may not be worn into surgery.  Patients discharged on the day of surgery will not be allowed to drive home.    Remember to use your hibiclens as instructed.Please shower with 1/2 bottle the evening before your surgery and the other 1/2 bottle the morning of surgery. Neck down avoiding private area.

## 2011-04-12 NOTE — H&P (Signed)
Jodi Keller 56 y.o. female. Who presents with heavy and irreg vaginal bleeding for once this year.  It  astedv4-5 days   She has uses 5-6 pads/ tampons every hour while menstruating.  She denies any CP or SOB.nothimg makes it better.  Nothing makes it worse.  No dysmenorrhea.  Pertinent Gynecological History: Contraception: Education given regarding options for contraception, including btl. Blood transfusions: none Sexually transmitted diseases: trichomonas Previous GYN Procedures   Last mammogram: 3years ago Last pap: normal Date: 7/12 OB History: c/s    Menstrual History: Menarche age: 56 No LMP recorded.    Past Medical History  Diagnosis Date  . Hypertension   . Anemia   . GERD (gastroesophageal reflux disease)   . Morbid obesity   . Uterine prolapse   . Bilateral carpal tunnel syndrome 12/16/2010  . 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 Hyperlipidemia 02/25/2011   Past Surgical History  Procedure Date  . Cesarean section   . Foot surgery    No current facility-administered medications for this encounter. Current outpatient prescriptions:benazepril (LOTENSIN) 20 MG tablet, Take 1 tablet (20 mg total) by mouth daily., Disp: 90 tablet, Rfl: 3;  Cholecalciferol (VITAMIN D PO), Take 1 tablet by mouth daily as needed. Often forgets to take and doesn't know strength, Disp: , Rfl: ;  furosemide (LASIX) 40 MG tablet, Take 40 mg by mouth daily. Usually takes once per week, Disp: , Rfl:  ibuprofen (ADVIL,MOTRIN) 200 MG tablet, Take 200-400 mg by mouth as needed. arthritis, Disp: , Rfl: ;  Multiple Vitamin (MULITIVITAMIN WITH MINERALS) TABS, Take 1 tablet by mouth daily., Disp: , Rfl: ;  omeprazole (PRILOSEC) 20 MG capsule, Take 20 mg by mouth daily. Takes about once per week only, Disp: , Rfl: ;  potassium chloride (K-DUR,KLOR-CON) 10 MEQ tablet, Take 10 mEq by mouth daily. Pt says she only takes about once/week., Disp: , Rfl:  traMADol (ULTRAM) 50 MG tablet, Take 50  mg by mouth daily as needed. Takes very infrequently.  Says it doesn't work.   For muscle pain, Disp: , Rfl: ;  VITAMIN E PO, Take 1 capsule by mouth daily as needed. Takes when she remembers and doesn't know strength., Disp: , Rfl:  No Known Allergies Review of Systems - General ROS: negative Respiratory ROS: no cough, shortness of breath, or wheezing Cardiovascular ROS: no chest pain or dyspnea on exertion Gastrointestinal ROS: no abdominal pain, change in bowel habits, or black or bloody stools Musculoskeletal ROS: negative Neurological ROS: no TIA or stroke symptoms   Physical Exam  There were no vitals taken for this visit. Constitutional: She appears well-developed and well-nourished.  HENT:  Head: Normocephalic.  Eyes: Pupils are equal, round, and reactive to light.  Neck: Normal range of motion. Neck supple.  Cardiovascular: Regular rhythm.   Respiratory: Effort normal and breath sounds normal.  GI: Soft.  Genitourinary  V/v WNL  Uterus normal size  No adnexal masses   Musculoskeletal: Normal range of motion.  Neurological: She is alert.  Skin: Skin is warm.  Psychiatric: She has a normal mood and affect.  No results found for this or any previous visit (from the past 72 hour(s)). [place] width5.41  Length7 OvariesWNLB 1.7cm endometrial mass  Assessment/Plan: Endometrial mass consistent with a polyp  Pt declined EMBX   Pt offered  obs vs surgery.  Pt chose surgery.  Plan D&C hysteroscopy polypectomy.  Risks are but not limited to bleeding, infection, scarring of the uterus and perforation.  Maelys Kinnick A 12/12/2010, 11:41 AM

## 2011-04-13 ENCOUNTER — Encounter (HOSPITAL_COMMUNITY): Payer: Self-pay | Admitting: Anesthesiology

## 2011-04-13 ENCOUNTER — Encounter (HOSPITAL_COMMUNITY)
Admission: RE | Disposition: A | Discharge status: Home / Self Care | Payer: Self-pay | Source: Ambulatory Visit | Attending: Obstetrics and Gynecology

## 2011-04-13 ENCOUNTER — Ambulatory Visit (HOSPITAL_COMMUNITY)
Admission: RE | Admit: 2011-04-13 | Discharge: 2011-04-13 | Disposition: A | Discharge status: Home / Self Care | Payer: BC Managed Care – PPO | Source: Ambulatory Visit | Attending: Obstetrics and Gynecology | Admitting: Obstetrics and Gynecology

## 2011-04-13 ENCOUNTER — Ambulatory Visit (HOSPITAL_COMMUNITY): Payer: BC Managed Care – PPO | Admitting: Anesthesiology

## 2011-04-13 DIAGNOSIS — N920 Excessive and frequent menstruation with regular cycle: Secondary | ICD-10-CM | POA: Insufficient documentation

## 2011-04-13 DIAGNOSIS — Z01812 Encounter for preprocedural laboratory examination: Secondary | ICD-10-CM | POA: Insufficient documentation

## 2011-04-13 DIAGNOSIS — N840 Polyp of corpus uteri: Secondary | ICD-10-CM

## 2011-04-13 DIAGNOSIS — Z01818 Encounter for other preprocedural examination: Secondary | ICD-10-CM | POA: Insufficient documentation

## 2011-04-13 LAB — PREGNANCY, URINE: Preg Test, Ur: NEGATIVE

## 2011-04-13 SURGERY — DILATATION & CURETTAGE/HYSTEROSCOPY WITH RESECTOCOPE
Anesthesia: General | Wound class: Clean Contaminated

## 2011-04-13 MED ORDER — LIDOCAINE HCL (CARDIAC) 20 MG/ML IV SOLN
INTRAVENOUS | Status: AC
  Filled 2011-04-13: qty 5

## 2011-04-13 MED ORDER — FENTANYL CITRATE 0.05 MG/ML IJ SOLN: 25.0000 ug | INTRAMUSCULAR | Status: DC | PRN

## 2011-04-13 MED ORDER — KETOROLAC TROMETHAMINE 30 MG/ML IJ SOLN: 15.0000 mg | Freq: Once | INTRAMUSCULAR | Status: DC | PRN

## 2011-04-13 MED ORDER — PROPOFOL 10 MG/ML IV EMUL
INTRAVENOUS | Status: DC | PRN
  Administered 2011-04-13: 200 mg via INTRAVENOUS

## 2011-04-13 MED ORDER — MIDAZOLAM HCL 5 MG/5ML IJ SOLN
INTRAMUSCULAR | Status: DC | PRN
  Administered 2011-04-13: 2 mg via INTRAVENOUS

## 2011-04-13 MED ORDER — MIDAZOLAM HCL 2 MG/2ML IJ SOLN
INTRAMUSCULAR | Status: AC
  Filled 2011-04-13: qty 2

## 2011-04-13 MED ORDER — PHENYLEPHRINE 40 MCG/ML (10ML) SYRINGE FOR IV PUSH (FOR BLOOD PRESSURE SUPPORT)
PREFILLED_SYRINGE | INTRAVENOUS | Status: AC
  Filled 2011-04-13: qty 5

## 2011-04-13 MED ORDER — LACTATED RINGERS IV SOLN
INTRAVENOUS | Status: DC
  Administered 2011-04-13 (×2): via INTRAVENOUS

## 2011-04-13 MED ORDER — HYDROCODONE-ACETAMINOPHEN 5-500 MG PO TABS: 1.0000 | ORAL_TABLET | Freq: Four times a day (QID) | ORAL | Status: AC | PRN

## 2011-04-13 MED ORDER — LIDOCAINE HCL (CARDIAC) 20 MG/ML IV SOLN
INTRAVENOUS | Status: DC | PRN
  Administered 2011-04-13: 80 mg via INTRAVENOUS

## 2011-04-13 MED ORDER — FENTANYL CITRATE 0.05 MG/ML IJ SOLN
INTRAMUSCULAR | Status: DC | PRN
  Administered 2011-04-13: 100 ug via INTRAVENOUS
  Administered 2011-04-13 (×2): 50 ug via INTRAVENOUS

## 2011-04-13 MED ORDER — SILVER NITRATE-POT NITRATE 75-25 % EX MISC
CUTANEOUS | Status: AC
  Filled 2011-04-13: qty 1

## 2011-04-13 MED ORDER — PROMETHAZINE HCL 25 MG/ML IJ SOLN: 6.2500 mg | INTRAMUSCULAR | Status: DC | PRN

## 2011-04-13 MED ORDER — FENTANYL CITRATE 0.05 MG/ML IJ SOLN
INTRAMUSCULAR | Status: AC
  Filled 2011-04-13: qty 5

## 2011-04-13 MED ORDER — SODIUM CHLORIDE 0.9 % IR SOLN
Status: DC | PRN
  Administered 2011-04-13: 3000 mL

## 2011-04-13 MED ORDER — MEPERIDINE HCL 25 MG/ML IJ SOLN: 6.2500 mg | INTRAMUSCULAR | Status: DC | PRN

## 2011-04-13 MED ORDER — PROPOFOL 10 MG/ML IV EMUL
INTRAVENOUS | Status: AC
  Filled 2011-04-13: qty 20

## 2011-04-13 MED ORDER — ONDANSETRON HCL 4 MG/2ML IJ SOLN
INTRAMUSCULAR | Status: AC
  Filled 2011-04-13: qty 2

## 2011-04-13 MED ORDER — ONDANSETRON HCL 4 MG/2ML IJ SOLN
INTRAMUSCULAR | Status: DC | PRN
  Administered 2011-04-13: 4 mg via INTRAVENOUS

## 2011-04-13 MED ORDER — LIDOCAINE HCL 1 % IJ SOLN
INTRAMUSCULAR | Status: DC | PRN
  Administered 2011-04-13: 20 mL

## 2011-04-13 SURGICAL SUPPLY — 17 items
ABLATOR ENDOMETRIAL BIPOLAR (ABLATOR) IMPLANT
CANISTER SUCTION 2500CC (MISCELLANEOUS) ×2 IMPLANT
CATH ROBINSON RED A/P 16FR (CATHETERS) ×2 IMPLANT
CLOTH BEACON ORANGE TIMEOUT ST (SAFETY) ×2 IMPLANT
CONTAINER PREFILL 10% NBF 60ML (FORM) ×4 IMPLANT
ELECT REM PT RETURN 9FT ADLT (ELECTROSURGICAL) ×2
ELECTRODE REM PT RTRN 9FT ADLT (ELECTROSURGICAL) ×1 IMPLANT
GLOVE BIO SURGEON STRL SZ 6.5 (GLOVE) ×2 IMPLANT
GLOVE BIOGEL PI IND STRL 7.0 (GLOVE) ×1 IMPLANT
GLOVE BIOGEL PI INDICATOR 7.0 (GLOVE) ×1
GOWN PREVENTION PLUS LG XLONG (DISPOSABLE) ×4 IMPLANT
GOWN STRL REIN XL XLG (GOWN DISPOSABLE) ×2 IMPLANT
LOOP ANGLED CUTTING 22FR (CUTTING LOOP) IMPLANT
PACK HYSTEROSCOPY LF (CUSTOM PROCEDURE TRAY) ×2 IMPLANT
SYR 20CC LL (SYRINGE) ×2 IMPLANT
TOWEL OR 17X24 6PK STRL BLUE (TOWEL DISPOSABLE) ×4 IMPLANT
WATER STERILE IRR 1000ML POUR (IV SOLUTION) ×2 IMPLANT

## 2011-04-13 NOTE — Addendum Note (Signed)
Addendum  created 04/13/11 1347 by Sandrea Hughs., MD   Modules edited:Orders, PRL Based Order Sets

## 2011-04-13 NOTE — Anesthesia Postprocedure Evaluation (Signed)
Anesthesia Post Note  Patient: Jodi Keller  Procedure(s) Performed: Procedure(s) (LRB): DILATATION & CURETTAGE/HYSTEROSCOPY WITH RESECTOCOPE (N/A)  Anesthesia type: General  Patient location: PACU  Post pain: Pain level controlled  Post assessment: Post-op Vital signs reviewed  Last Vitals:  Filed Vitals:   04/13/11 1315  BP: 129/71  Pulse: 57  Temp:   Resp: 19    Post vital signs: Reviewed  Level of consciousness: sedated  Complications: No apparent anesthesia complicationsfj

## 2011-04-13 NOTE — Discharge Instructions (Signed)
DISCHARGE INSTRUCTIONS: HYSTEROSCOPY / ENDOMETRIAL ABLATION  The following instructions have been prepared to help you care for yourself upon your return home.  Personal hygiene: [name] Use sanitary pads for vaginal drainage, not tampons. . Shower the day after your procedure. . NO tub baths, pools or Jacuzzis for 2-3 weeks. . Wipe front to back after using the bathroom.  Activity and limitations: . Do NOT drive or operate any equipment for 24 hours. The effects of anesthesia are still present and drowsiness may result. . Do NOT rest in bed all day. . Walking is encouraged. . Walk up and down stairs slowly. . You may resume your normal activity in one to two days or as indicated by your physician. Sexual activity: NO intercourse for at least 2 weeks after the procedure, or as indicated by your Doctor.  Diet: Eat a light meal as desired this evening. You may resume your usual diet tomorrow.  Return to Work: You may resume your work activities in one to two days or as indicated by Therapist, sports.  What to expect after your surgery: Expect to have vaginal bleeding/discharge for 2-3 days and spotting for up to 10 days. It is not unusual to have soreness for up to 1-2 weeks. You may have a slight burning sensation when you urinate for the first day. Mild cramps may continue for a couple of days. You may have a regular period in 2-6 weeks.  Call your doctor for any of the following: . Excessive vaginal bleeding or clotting, saturating and changing one pad every hour. . Inability to urinate 6 hours after discharge from hospital. . Pain not relieved by pain medication. . Fever of 100.4 F or greater. . Unusual vaginal discharge or odor.  Return to office _________________Call for an appointment ___________________  Patient's signature: ______________________  Nurse's signature ________________________  Post Anesthesia Care Unit [phone]   Hysteroscopy Hysteroscopy is a procedure  used for looking inside the womb (uterus). It may be done for many different reasons, including:  To evaluate abnormal bleeding, fibroid (benign, noncancerous) tumors, polyps, scar tissue (adhesions), and possibly cancer of the uterus.   To look for lumps (tumors) and other uterine growths.   To look for causes of why a woman cannot get pregnant (infertility), causes of recurrent loss of pregnancy (miscarriages), or a lost intrauterine device (IUD).   To perform a sterilization by blocking the fallopian tubes from inside the uterus.  A hysteroscopy should be done right after a menstrual period to be sure you are not pregnant. LET YOUR CAREGIVER KNOW ABOUT:   Allergies.   Medicines taken, including herbs, eyedrops, over-the-counter medicines, and creams.   Use of steroids (by mouth or creams).   Previous problems with anesthetics or numbing medicines.   History of bleeding or blood problems.   History of blood clots.   Possibility of pregnancy, if this applies.   Previous surgery.   Other health problems.  RISKS AND COMPLICATIONS   Putting a hole in the uterus.   Excessive bleeding.   Infection.   Damage to the cervix.   Injury to other organs.   Allergic reaction to medicines.   Too much fluid used in the uterus for the procedure.  BEFORE THE PROCEDURE   Do not take aspirin or blood thinners for a week before the procedure, or as directed. It can cause bleeding.   Arrive at least 60 minutes before the procedure or as directed to read and sign the necessary forms.  Arrange for someone to take you home after the procedure.   If you smoke, do not smoke for 2 weeks before the procedure.  PROCEDURE   Your caregiver may give you medicine to relax you. He or she may also give you a medicine that numbs the area around the cervix (local anesthetic) or a medicine that makes you sleep (general anesthesia).   Sometimes, a medicine is placed in the cervix the day before  the procedure. This medicine makes the cervix have a larger opening (dilate). This makes it easier for the instrument to be inserted into the uterus.   A small instrument (hysteroscope) is inserted through the vagina into the uterus. This instrument is similar to a pencil-sized telescope with a light.   During the procedure, air or a liquid is put into the uterus, which allows the surgeon to see better.   Sometimes, tissue is gently scraped from inside the uterus. These tissue samples are sent to a specialist who looks at tissue samples (pathologist). The pathologist will give a report to your caregiver. This will help your caregiver decide if further treatment is necessary. The report will also help your caregiver decide on the best treatment if the test comes back abnormal.  AFTER THE PROCEDURE   If you had a general anesthetic, you may be groggy for a couple hours after the procedure.   If you had a local anesthetic, you will be advised to rest at the surgical center or caregiver's office until you are stable and feel ready to go home.   You may have some cramping for a couple days.   You may have bleeding, which varies from light spotting for a few days to menstrual-like bleeding for up to 3 to 7 days. This is normal.   Have someone take you home.  FINDING OUT THE RESULTS OF YOUR TEST Not all test results are available during your visit. If your test results are not back during the visit, make an appointment with your caregiver to find out the results. Do not assume everything is normal if you have not heard from your caregiver or the medical facility. It is important for you to follow up on all of your test results. HOME CARE INSTRUCTIONS   Do not drive for 24 hours or as instructed.   Only take over-the-counter or prescription medicines for pain, discomfort, or fever as directed by your caregiver.   Do not take aspirin. It can cause or aggravate bleeding.   Do not drive or drink  alcohol while taking pain medicine.   You may resume your usual diet.   Do not use tampons, douche, or have sexual intercourse for 2 weeks, or as advised by your caregiver.   Rest and sleep for the first 24 to 48 hours.   Take your temperature twice a day for 4 to 5 days. Write it down. Give these temperatures to your caregiver if they are abnormal (above 98.6 F or 37.0 C).   Take medicines your caregiver has ordered as directed.   Follow your caregiver's advice regarding diet, exercise, lifting, driving, and general activities.   Take showers instead of baths for 2 weeks, or as recommended by your caregiver.   If you develop constipation:   Take a mild laxative with the advice of your caregiver.   Eat bran foods.   Drink enough water and fluids to keep your urine clear or pale yellow.   Try to have someone with you or available to  you for the first 24 to 48 hours, especially if you had a general anesthetic.   Make sure you and your family understand everything about your operation and recovery.   Follow your caregiver's advice regarding follow-up appointments and Pap smears.  SEEK MEDICAL CARE IF:   You feel dizzy or lightheaded.   You feel sick to your stomach (nauseous).   You develop abnormal vaginal discharge.   You develop a rash.   You have an abnormal reaction or allergy to your medicine.   You need stronger pain medicine.  SEEK IMMEDIATE MEDICAL CARE IF:   Bleeding is heavier than a normal menstrual period or you have blood clots.   You have an oral temperature above 102 F (38.9 C), not controlled by medicine.   You have increasing cramps or pains not relieved with medicine.   You develop belly (abdominal) pain that does not seem to be related to the same area of earlier cramping and pain.   You pass out.   You develop pain in the tops of your shoulders (shoulder strap areas).   You develop shortness of breath.  MAKE SURE YOU:   Understand these  instructions.   Will watch your condition.   Will get help right away if you are not doing well or get worse.  Document Released: 04/17/2000 Document Revised: 12/29/2010 Document Reviewed: 08/10/2008 [facility] Patient Information 2012 [place], [place].

## 2011-04-13 NOTE — Op Note (Signed)
Pre op DX: Endometrial polyps   Post Op ZO:[id number] polyps   PHYSICIAN : Trajan Grove   ASSISTANTS: none   ANESTHESIA:   General LMA and paracervical block  ESTIMATED BLOOD LOSS: minimal  LOCAL MEDICATIONS USED:  LIDOCAINE 16CC  SPECIMEN:  Source of Specimen:  endometrial curettings and polyps Deficit is 150cc normal saline  DISPOSITION OF SPECIMEN:  PATHOLOGY  COUNTS Correct:  YES    DICTATION #: The patient was taken to the operating room and prepped and draped in a normal sterile fashion. An in out catheter was used to drain the bladder.   A bivalve speculum was placed into the vagina and anterior lip of the cervix was grasped with a single-tooth tenaculum.  20 cc of 1% lidocaine was used for cervical block.  the cervix was then dilated with Shawnie Pons dilators up to 17. The hysteroscope was placed into the uterine cavity. The  entire uterus and both ostia were visualized. There were four polyps seen.  One in the lower uterine segment, one near the fundus, a small one on the left side wall and one near the lert ostium.  All were morcelated and removed with the True clear morcellator.    Hyseroscope was then removed from the uterus. A sharp curettage was then done with a curette and endometrial curettings were obtained. The endometrial curettings were sent to pathology. Again the hysteroscope was placed into the uterine cavity. Both ostia were again visualized there were no polyps or submucosal fibroids or endometrial masses seen. The tenaculum was removed from the cervix and hemostasis was noted.   PLAN OF CARE: discharge to home  PATIENT DISPOSITION:  PACU - hemodynamically stable.

## 2011-04-13 NOTE — Interval H&P Note (Signed)
History and Physical Interval Note:  04/13/2011 11:49 AM  Jodi Keller  has presented today for surgery, with the diagnosis of Endometrial Polyp  The various methods of treatment have been discussed with the patient and family. After consideration of risks, benefits and other options for treatment, the patient has consented to  Procedure(s) (LRB): DILATATION & CURETTAGE/HYSTEROSCOPY WITH RESECTOCOPE (N/A) as a surgical intervention .  The patients' history has been reviewed, patient examined, no change in status, stable for surgery.  I have reviewed the patients' chart and labs.  Questions were answered to the patient's satisfaction.     [name] A  Date of Initial H&P: 3/20  History reviewed, patient examined, no change in status, stable for surgery.

## 2011-04-13 NOTE — Transfer of Care (Signed)
Immediate Anesthesia Transfer of Care Note  Patient: Jodi Keller  Procedure(s) Performed: Procedure(s) (LRB): DILATATION & CURETTAGE/HYSTEROSCOPY WITH RESECTOCOPE (N/A)  Patient Location: PACU  Anesthesia Type: General  Level of Consciousness: awake, alert  and oriented  Airway & Oxygen Therapy: Patient Spontanous Breathing and Patient connected to nasal cannula oxygen  Post-op Assessment: Report given to PACU RN and Post -op Vital signs reviewed and stable  Post vital signs: stable  Complications: No apparent anesthesia complications

## 2011-04-13 NOTE — Anesthesia Preprocedure Evaluation (Signed)
Anesthesia Evaluation  Patient identified by MRN, date of birth, ID band Patient awake    Reviewed: Allergy & Precautions, H&P , NPO status , Patient's Chart, lab work & pertinent test results, reviewed documented beta blocker date and time   Airway Mallampati: II TM Distance: >3 FB Neck ROM: full    Dental No notable dental hx. (+) Teeth Intact   Pulmonary neg pulmonary ROS,    Pulmonary exam normal       Cardiovascular hypertension, Pt. on medications and Pt. on home beta blockers     Neuro/Psych negative psych ROS   GI/Hepatic Neg liver ROS, GERD-  Medicated and Controlled,  Endo/Other  Morbid obesity  Renal/GU negative Renal ROS  Female GU complaint     Musculoskeletal negative musculoskeletal ROS (+)   Abdominal (+) + obese,   Peds negative pediatric ROS (+)  Hematology negative hematology ROS (+)   Anesthesia Other Findings   Reproductive/Obstetrics negative OB ROS                           Anesthesia Physical Anesthesia Plan  ASA: III  Anesthesia Plan: General   Post-op Pain Management:    Induction: Intravenous  Airway Management Planned: LMA  Additional Equipment:   Intra-op Plan:   Post-operative Plan:   Informed Consent: I have reviewed the patients History and Physical, chart, labs and discussed the procedure including the risks, benefits and alternatives for the proposed anesthesia with the patient or authorized representative who has indicated his/her understanding and acceptance.     Plan Discussed with: CRNA and Surgeon  Anesthesia Plan Comments:         Anesthesia Quick Evaluation

## 2011-04-28 ENCOUNTER — Encounter: Payer: BC Managed Care – PPO | Admitting: Obstetrics and Gynecology

## 2011-05-01 ENCOUNTER — Encounter: Payer: BC Managed Care – PPO | Admitting: Obstetrics and Gynecology

## 2011-05-04 ENCOUNTER — Encounter: Payer: Self-pay | Admitting: Obstetrics and Gynecology

## 2011-05-04 ENCOUNTER — Ambulatory Visit (INDEPENDENT_AMBULATORY_CARE_PROVIDER_SITE_OTHER): Payer: BC Managed Care – PPO | Admitting: Obstetrics and Gynecology

## 2011-05-04 VITALS — BP 130/90 | HR 88 | Temp 98.4°F | Resp 16 | Ht 68.0 in | Wt 336.0 lb

## 2011-05-04 DIAGNOSIS — N840 Polyp of corpus uteri: Secondary | ICD-10-CM

## 2011-05-04 NOTE — Progress Notes (Signed)
  Assessment:    Doing well postoperatively.   No N/V.  No pain.  Normal bowel and bladder function. Physical Examination: General appearance - alert, well appearing, and in no distress Chest - clear to auscultation, no wheezes, rales or rhonchi, symmetric air entry Heart - normal rate, regular rhythm, normal S1, S2, no murmurs, rubs, clicks or gallops Abdomen - soft, nontender, nondistended, no masses or organomegaly Pelvic - normal external genitalia, vulva, vagina, cervix, uterus and adnexa Musculoskeletal - no joint tenderness, deformity or swelling Extremities - Homan's sign negative bilaterally Endometrial polyps S/P D&C polypectomy  Path was benign Plan:     1. Continue any current medications. 2. Wound care discussed. 3. Pt is to increase activities as tolerated. And continue with normal work and other activities Pt offered progesterone q month vs [place] q 12 months to evaluate endometrial lining.  The pt chose the latter 4. Follow up: July for pap

## 2011-06-09 ENCOUNTER — Telehealth: Payer: Self-pay | Admitting: Obstetrics and Gynecology

## 2011-06-09 NOTE — Telephone Encounter (Signed)
Triage/epic 

## 2011-06-09 NOTE — Telephone Encounter (Signed)
ND PT 

## 2011-06-11 ENCOUNTER — Ambulatory Visit: Payer: BC Managed Care – PPO | Admitting: Family Medicine

## 2011-06-11 VITALS — BP 116/73 | HR 64 | Temp 97.8°F | Resp 20 | Ht 67.0 in | Wt 337.0 lb

## 2011-06-11 DIAGNOSIS — J019 Acute sinusitis, unspecified: Secondary | ICD-10-CM

## 2011-06-11 DIAGNOSIS — J40 Bronchitis, not specified as acute or chronic: Secondary | ICD-10-CM

## 2011-06-11 DIAGNOSIS — J329 Chronic sinusitis, unspecified: Secondary | ICD-10-CM

## 2011-06-11 MED ORDER — PREDNISONE 20 MG PO TABS: 40.0000 mg | ORAL_TABLET | Freq: Every day | ORAL | Status: AC

## 2011-06-11 MED ORDER — AZITHROMYCIN 250 MG PO TABS: ORAL_TABLET | ORAL | Status: AC

## 2011-06-11 NOTE — Progress Notes (Signed)
This is a 56 year old woman with 10 days of sinus congestion and progressive cough. She's had extreme fatigue as well having to lie down several times during the day. The cough is made her hoarse and she has otalgia bilaterally. She has no fever.  Objective: Morbidly obese woman in no acute distress, hoarse voice  HEENT: Mild serous otitis changes otherwise patient has most nasal drainage in the posterior pharynx.  Neck: Supple no adenopathy  Chest: Bilateral expiratory wheezes without rales. Heart: Regular no murmur or gallop  Assessment sinusitis with bronchitis  Plan: Z-Pak and prednisone 40 daily x5

## 2011-06-11 NOTE — Patient Instructions (Signed)
Bronchitis  Bronchitis is the body's way of reacting to injury and/or infection (inflammation) of the bronchi. Bronchi are the air tubes that extend from the windpipe into the lungs. If the inflammation becomes severe, it may cause shortness of breath.  CAUSES   Inflammation may be caused by:   A virus.   Germs (bacteria).   Dust.   Allergens.   Pollutants and many other irritants.  The cells lining the bronchial tree are covered with tiny hairs (cilia). These constantly beat upward, away from the lungs, toward the mouth. This keeps the lungs free of pollutants. When these cells become too irritated and are unable to do their job, mucus begins to develop. This causes the characteristic cough of bronchitis. The cough clears the lungs when the cilia are unable to do their job. Without either of these protective mechanisms, the mucus would settle in the lungs. Then you would develop pneumonia.  Smoking is a common cause of bronchitis and can contribute to pneumonia. Stopping this habit is the single most important thing you can do to help yourself.  TREATMENT    Your caregiver may prescribe an antibiotic if the cough is caused by bacteria. Also, medicines that open up your airways make it easier to breathe. Your caregiver may also recommend or prescribe an expectorant. It will loosen the mucus to be coughed up. Only take over-the-counter or prescription medicines for pain, discomfort, or fever as directed by your caregiver.   Removing whatever causes the problem (smoking, for example) is critical to preventing the problem from getting worse.   Cough suppressants may be prescribed for relief of cough symptoms.   Inhaled medicines may be prescribed to help with symptoms now and to help prevent problems from returning.   For those with recurrent (chronic) bronchitis, there may be a need for steroid medicines.  SEEK IMMEDIATE MEDICAL CARE IF:    During treatment, you develop more pus-like mucus (purulent  sputum).   You have a fever.   Your baby is older than 3 months with a rectal temperature of 102 F (38.9 C) or higher.   Your baby is 3 months old or younger with a rectal temperature of 100.4 F (38 C) or higher.   You become progressively more ill.   You have increased difficulty breathing, wheezing, or shortness of breath.  It is necessary to seek immediate medical care if you are elderly or sick from any other disease.  MAKE SURE YOU:    Understand these instructions.   Will watch your condition.   Will get help right away if you are not doing well or get worse.  Document Released: 01/09/2005 Document Revised: 12/29/2010 Document Reviewed: 11/19/2007  [employer] Patient Information 2012 [employer]  Sinusitis  Sinuses are air pockets within the bones of your face. The growth of bacteria within a sinus leads to infection. The infection prevents the sinuses from draining. This infection is called sinusitis.  SYMPTOMS   There will be different areas of pain depending on which sinuses have become infected.   The maxillary sinuses often produce pain beneath the eyes.   Frontal sinusitis may cause pain in the middle of the forehead and above the eyes.  Other problems (symptoms) include:   Toothaches.   Colored, pus-like (purulent) drainage from the nose.   Swelling, warmth, and tenderness over the sinus areas may be signs of infection.  TREATMENT   Sinusitis is most often determined by an exam.X-rays may be taken. If x-rays have   been taken, make sure you obtain your results or find out how you are to obtain them. Your caregiver may give you medications (antibiotics). These are medications that will help kill the bacteria causing the infection. You may also be given a medication (decongestant) that helps to reduce sinus swelling.   HOME CARE INSTRUCTIONS    Only take over-the-counter or prescription medicines for pain, discomfort, or fever as directed by your caregiver.   Drink extra fluids. Fluids  help thin the mucus so your sinuses can drain more easily.   Applying either moist heat or ice packs to the sinus areas may help relieve discomfort.   Use saline nasal sprays to help moisten your sinuses. The sprays can be found at your local drugstore.  SEEK IMMEDIATE MEDICAL CARE IF:   You have a fever.   You have increasing pain, severe headaches, or toothache.   You have nausea, vomiting, or drowsiness.   You develop unusual swelling around the face or trouble seeing.  MAKE SURE YOU:    Understand these instructions.   Will watch your condition.   Will get help right away if you are not doing well or get worse.  Document Released: 01/09/2005 Document Revised: 12/29/2010 Document Reviewed: 08/08/2006  [employer] Patient Information 2012 [employer]

## 2011-06-12 ENCOUNTER — Telehealth: Payer: Self-pay

## 2011-06-12 NOTE — Telephone Encounter (Signed)
Spoke with pt rgd msg pt c/o vaginal itching from taking abx advised pt can try monistat otc if no relief call office for eval pt voice understanding

## 2011-06-20 ENCOUNTER — Other Ambulatory Visit: Payer: Self-pay | Admitting: Obstetrics and Gynecology

## 2011-06-20 DIAGNOSIS — Z1231 Encounter for screening mammogram for malignant neoplasm of breast: Secondary | ICD-10-CM

## 2011-07-13 ENCOUNTER — Ambulatory Visit (HOSPITAL_COMMUNITY)
Admission: RE | Admit: 2011-07-13 | Discharge: 2011-07-13 | Disposition: A | Discharge status: Home / Self Care | Payer: BC Managed Care – PPO | Source: Ambulatory Visit | Attending: Obstetrics and Gynecology | Admitting: Obstetrics and Gynecology

## 2011-07-13 DIAGNOSIS — Z1231 Encounter for screening mammogram for malignant neoplasm of breast: Secondary | ICD-10-CM

## 2011-07-21 ENCOUNTER — Ambulatory Visit (INDEPENDENT_AMBULATORY_CARE_PROVIDER_SITE_OTHER): Payer: BC Managed Care – PPO | Admitting: Internal Medicine

## 2011-07-21 ENCOUNTER — Encounter: Payer: Self-pay | Admitting: Internal Medicine

## 2011-07-21 VITALS — BP 112/72 | HR 85 | Temp 97.8°F | Ht 67.0 in | Wt 349.4 lb

## 2011-07-21 DIAGNOSIS — E119 Type 2 diabetes mellitus without complications: Secondary | ICD-10-CM | POA: Insufficient documentation

## 2011-07-21 DIAGNOSIS — R7309 Other abnormal glucose: Secondary | ICD-10-CM

## 2011-07-21 DIAGNOSIS — I10 Essential (primary) hypertension: Secondary | ICD-10-CM

## 2011-07-21 DIAGNOSIS — R609 Edema, unspecified: Secondary | ICD-10-CM

## 2011-07-21 DIAGNOSIS — R7302 Impaired glucose tolerance (oral): Secondary | ICD-10-CM | POA: Insufficient documentation

## 2011-07-21 MED ORDER — CYCLOBENZAPRINE HCL 10 MG PO TABS: 10.0000 mg | ORAL_TABLET | Freq: Three times a day (TID) | ORAL | Status: DC | PRN

## 2011-07-21 NOTE — Patient Instructions (Addendum)
Continue all other medications as before - please take the lasix and K every day OK for flexeril at night for sleep and leg cramps Please start wearing compression stockings, knee high at the "tightest" you can get without prescription - consider [facility] on [address] Please elevated legs as you are able, when sitting Please avoid extra salt, or extra fluids Please go to XRAY in the Basement for the x-ray test You will be contacted by phone if any changes need to be made immediately.  Otherwise, you will receive a letter about your results with an explanation. You will be contacted regarding the referral for: echocardiogram Please return in Nov 2013 with Lab testing done 3-5 days before

## 2011-07-22 ENCOUNTER — Encounter: Payer: Self-pay | Admitting: Internal Medicine

## 2011-07-22 NOTE — Progress Notes (Signed)
Subjective:    Patient ID: Jodi Keller, female    DOB: [birth year], 56 y.o.   MRN: [id number]  HPI  Here to f/u with c/o 1-2 wks gradually worsening LE edema, began with mild edema that seemed to go away at night, but now persist throughout the day, with some discomfort to both legs;  Admits to not taking her lasix and K every day recently as it seems to lead to cramps at night;  Pt denies chest pain, increased sob or doe, wheezing, orthopnea, PND, palpitations, dizziness or syncope.  Pt denies new neurological symptoms such as new headache, or facial or extremity weakness or numbness   Pt denies polydipsia, polyuria.   Pt denies fever, wt loss, night sweats, loss of appetite, or other constitutional symptoms Past Medical History  Diagnosis Date  . Hypertension   . Anemia   . GERD (gastroesophageal reflux disease)   . Morbid obesity   . Uterine prolapse   . Bilateral carpal tunnel syndrome 12/16/2010  . 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 Hyperlipidemia 02/25/2011   Past Surgical History  Procedure Date  . Cesarean section   . Foot surgery     reports that she has never smoked. She does not have any smokeless tobacco history on file. She reports that she drinks alcohol. She reports that she does not use illicit drugs. family history includes Cancer in her father and sisters; Diabetes in her maternal grandmother; and Thyroid disease in her other. No Known Allergies Current Outpatient Prescriptions on File Prior to Visit  Medication Sig Dispense Refill  . benazepril (LOTENSIN) 20 MG tablet Take 1 tablet (20 mg total) by mouth daily.  90 tablet  3  . furosemide (LASIX) 40 MG tablet Take 40 mg by mouth daily. Usually takes once per week      . omeprazole (PRILOSEC) 20 MG capsule Take 20 mg by mouth daily. Takes about once per week only      . potassium chloride (K-DUR,KLOR-CON) 10 MEQ tablet Take 10 mEq by mouth daily. Pt says she only takes about once/week.      .  Cholecalciferol (VITAMIN D PO) Take 1 tablet by mouth daily as needed. Often forgets to take and doesn't know strength      . HYDROcodone-acetaminophen (VICODIN) 5-500 MG per tablet       . ibuprofen (ADVIL,MOTRIN) 200 MG tablet Take 200-400 mg by mouth as needed. arthritis      . Multiple Vitamin (MULITIVITAMIN WITH MINERALS) TABS Take 1 tablet by mouth daily.      . traMADol (ULTRAM) 50 MG tablet Take 50 mg by mouth daily as needed. Takes very infrequently.  Says it doesn't work.   For muscle pain      . VITAMIN E PO Take 1 capsule by mouth daily as needed. Takes when she remembers and doesn't know strength.       Review of Systems Review of Systems  Constitutional: Negative for diaphoresis and unexpected weight change.  Eyes: Negative for photophobia and visual disturbance.  Respiratory: Negative for choking and stridor.   Gastrointestinal: Negative for vomiting and blood in stool.  Genitourinary: Negative for hematuria and decreased urine volume.  Musculoskeletal: Negative for gait problem.  Skin: Negative for color change and wound.  Neurological: Negative for tremors and numbness.  Psychiatric/Behavioral: Negative for decreased concentration. The patient is not hyperactive.      Objective:   Physical Exam BP 112/72  Pulse 85  Temp 97.8 F (  36.6 C) (Oral)  Ht 5\' 7"  (1.702 m)  Wt 349 lb 6 oz (158.475 kg)  BMI 54.72 kg/m2  SpO2 98%  LMP 01/12/2011 Physical Exam  VS noted, not ill appearing, morbid obese.  HENT: Head: Normocephalic.  Right Ear: External ear normal.  Left Ear: External ear normal.  Eyes: Conjunctivae and EOM are normal. Pupils are equal, round, and reactive to light.  Neck: Normal range of motion. Neck supple.  Cardiovascular: Normal rate and regular rhythm.   Pulmonary/Chest: Effort normal and breath sounds normal.  Abd:  Soft, NT, non-distended, + BS Neurological: Pt is alert. Not confused LE:  bilat 12+ edema to knees without ulcer, abscess,  cellulitis Skin: Skin is warm. No erythema.  Psychiatric: Pt behavior is normal. Thought content normal.     Assessment & Plan:

## 2011-07-22 NOTE — Assessment & Plan Note (Signed)
Asympt, for a1c with next labs Lab Results  Component Value Date   WBC 7.8 04/06/2011   HGB 15.2* 04/06/2011   HCT 46.2* 04/06/2011   PLT 244 04/06/2011   GLUCOSE 124* 04/06/2011   CHOL 172 12/16/2010   TRIG 64.0 12/16/2010   HDL 56.00 12/16/2010   LDLCALC 103* 12/16/2010   ALT 16 12/16/2010   AST 19 12/16/2010   NA 139 04/06/2011   K 3.6 04/06/2011   CL 105 04/06/2011   CREATININE 0.89 04/06/2011   BUN 18 04/06/2011   CO2 26 04/06/2011   TSH 2.77 12/16/2010

## 2011-07-22 NOTE — Assessment & Plan Note (Signed)
Most likely related to noncompliacne with meds and venous insuff, but cant r/o cardiac - for cxr, echo, encouraged compliance with meds, gave flexeril 10 qhs prn to help reduce risk of nighttime cramps, for compression stockings, leg elevation, avoid salt;  And ECG reviewed as per emr from mar 2013

## 2011-07-22 NOTE — Assessment & Plan Note (Signed)
stable overall by hx and exam, most recent data reviewed with pt, and pt to continue medical treatment as before BP Readings from Last 3 Encounters:  07/21/11 112/72  06/11/11 116/73  05/04/11 130/90

## 2011-07-31 ENCOUNTER — Other Ambulatory Visit: Payer: Self-pay | Admitting: Internal Medicine

## 2011-08-07 ENCOUNTER — Other Ambulatory Visit (HOSPITAL_COMMUNITY): Payer: BC Managed Care – PPO

## 2011-08-11 ENCOUNTER — Ambulatory Visit (HOSPITAL_COMMUNITY): Payer: BC Managed Care – PPO

## 2011-08-18 ENCOUNTER — Other Ambulatory Visit (HOSPITAL_COMMUNITY): Payer: BC Managed Care – PPO

## 2011-08-23 ENCOUNTER — Other Ambulatory Visit: Payer: Self-pay

## 2011-08-23 ENCOUNTER — Ambulatory Visit (HOSPITAL_COMMUNITY): Payer: BC Managed Care – PPO | Attending: Internal Medicine | Admitting: Radiology

## 2011-08-23 DIAGNOSIS — R0609 Other forms of dyspnea: Secondary | ICD-10-CM | POA: Insufficient documentation

## 2011-08-23 DIAGNOSIS — R0989 Other specified symptoms and signs involving the circulatory and respiratory systems: Secondary | ICD-10-CM | POA: Insufficient documentation

## 2011-08-23 DIAGNOSIS — E785 Hyperlipidemia, unspecified: Secondary | ICD-10-CM | POA: Insufficient documentation

## 2011-08-23 DIAGNOSIS — R609 Edema, unspecified: Secondary | ICD-10-CM

## 2011-08-23 DIAGNOSIS — I10 Essential (primary) hypertension: Secondary | ICD-10-CM | POA: Insufficient documentation

## 2011-08-23 NOTE — Progress Notes (Signed)
Echocardiogram performed.  

## 2011-08-27 ENCOUNTER — Encounter: Payer: Self-pay | Admitting: Internal Medicine

## 2011-08-31 ENCOUNTER — Other Ambulatory Visit: Payer: Self-pay | Admitting: Internal Medicine

## 2011-09-08 ENCOUNTER — Encounter: Payer: Self-pay | Admitting: Internal Medicine

## 2011-09-08 ENCOUNTER — Ambulatory Visit (INDEPENDENT_AMBULATORY_CARE_PROVIDER_SITE_OTHER)
Admission: RE | Admit: 2011-09-08 | Discharge: 2011-09-08 | Disposition: A | Discharge status: Home / Self Care | Payer: BC Managed Care – PPO | Source: Ambulatory Visit | Attending: Internal Medicine | Admitting: Internal Medicine

## 2011-09-08 DIAGNOSIS — R609 Edema, unspecified: Secondary | ICD-10-CM

## 2011-12-08 ENCOUNTER — Other Ambulatory Visit: Payer: Self-pay | Admitting: Internal Medicine

## 2011-12-20 ENCOUNTER — Encounter: Payer: Self-pay | Admitting: Internal Medicine

## 2011-12-20 ENCOUNTER — Other Ambulatory Visit (INDEPENDENT_AMBULATORY_CARE_PROVIDER_SITE_OTHER): Payer: BC Managed Care – PPO

## 2011-12-20 ENCOUNTER — Ambulatory Visit (INDEPENDENT_AMBULATORY_CARE_PROVIDER_SITE_OTHER): Payer: BC Managed Care – PPO | Admitting: Internal Medicine

## 2011-12-20 VITALS — BP 130/72 | HR 85 | Temp 97.4°F | Ht 67.0 in | Wt 363.0 lb

## 2011-12-20 DIAGNOSIS — D45 Polycythemia vera: Secondary | ICD-10-CM

## 2011-12-20 DIAGNOSIS — R7309 Other abnormal glucose: Secondary | ICD-10-CM

## 2011-12-20 DIAGNOSIS — D751 Secondary polycythemia: Secondary | ICD-10-CM | POA: Insufficient documentation

## 2011-12-20 DIAGNOSIS — Z0000 Encounter for general adult medical examination without abnormal findings: Secondary | ICD-10-CM

## 2011-12-20 DIAGNOSIS — R7302 Impaired glucose tolerance (oral): Secondary | ICD-10-CM

## 2011-12-20 DIAGNOSIS — D509 Iron deficiency anemia, unspecified: Secondary | ICD-10-CM

## 2011-12-20 DIAGNOSIS — Z23 Encounter for immunization: Secondary | ICD-10-CM

## 2011-12-20 LAB — URINALYSIS, ROUTINE W REFLEX MICROSCOPIC
Bilirubin Urine: NEGATIVE
Nitrite: NEGATIVE
Urobilinogen, UA: 0.2 (ref 0.0–1.0)

## 2011-12-20 LAB — CBC WITH DIFFERENTIAL/PLATELET
Basophils Absolute: 0 10*3/uL (ref 0.0–0.1)
Basophils Relative: 0.4 % (ref 0.0–3.0)
Eosinophils Absolute: 0.2 10*3/uL (ref 0.0–0.7)
Lymphocytes Relative: 27.1 % (ref 12.0–46.0)
MCHC: 33.1 g/dL (ref 30.0–36.0)
MCV: 91 fl (ref 78.0–100.0)
Monocytes Absolute: 0.5 10*3/uL (ref 0.1–1.0)
Neutrophils Relative %: 62.1 % (ref 43.0–77.0)
Platelets: 255 10*3/uL (ref 150.0–400.0)
RBC: 5.04 Mil/uL (ref 3.87–5.11)
RDW: 14.8 % — ABNORMAL HIGH (ref 11.5–14.6)

## 2011-12-20 LAB — IBC PANEL: Iron: 72 ug/dL (ref 42–145)

## 2011-12-20 LAB — LIPID PANEL
HDL: 56.2 mg/dL (ref >39.00)
Total CHOL/HDL Ratio: 3

## 2011-12-20 LAB — BASIC METABOLIC PANEL
CO2: 26 mEq/L (ref 19–32)
Chloride: 107 mEq/L (ref 96–112)
Glucose, Bld: 103 mg/dL — ABNORMAL HIGH (ref 70–99)
Potassium: 4.5 mEq/L (ref 3.5–5.1)
Sodium: 138 mEq/L (ref 135–145)

## 2011-12-20 LAB — HEPATIC FUNCTION PANEL
ALT: 23 U/L (ref 0–35)
AST: 23 U/L (ref 0–37)
Albumin: 3.4 g/dL — ABNORMAL LOW (ref 3.5–5.2)
Alkaline Phosphatase: 75 U/L (ref 39–117)
Bilirubin, Direct: 0.2 mg/dL (ref 0.0–0.3)
Total Protein: 6.7 g/dL (ref 6.0–8.3)

## 2011-12-20 LAB — HEMOGLOBIN A1C: Hgb A1c MFr Bld: 5.4 % (ref 4.6–6.5)

## 2011-12-20 LAB — TSH: TSH: 3.44 u[IU]/mL (ref 0.35–5.50)

## 2011-12-20 LAB — FERRITIN: Ferritin: 47.4 ng/mL (ref 10.0–291.0)

## 2011-12-20 MED ORDER — CARISOPRODOL 350 MG PO TABS: 350.0000 mg | ORAL_TABLET | Freq: Two times a day (BID) | ORAL | Status: DC | PRN

## 2011-12-20 MED ORDER — FUROSEMIDE 40 MG PO TABS: 40.0000 mg | ORAL_TABLET | Freq: Every day | ORAL | Status: DC | PRN

## 2011-12-20 MED ORDER — NAPROXEN 500 MG PO TABS: 500.0000 mg | ORAL_TABLET | Freq: Two times a day (BID) | ORAL | Status: DC | PRN

## 2011-12-20 MED ORDER — POTASSIUM CHLORIDE CRYS ER 10 MEQ PO TBCR: 10.0000 meq | EXTENDED_RELEASE_TABLET | Freq: Every day | ORAL | Status: DC

## 2011-12-20 MED ORDER — BENAZEPRIL HCL 20 MG PO TABS: 20.0000 mg | ORAL_TABLET | Freq: Every day | ORAL | Status: DC

## 2011-12-20 MED ORDER — OMEPRAZOLE 20 MG PO CPDR: 20.0000 mg | DELAYED_RELEASE_CAPSULE | Freq: Every day | ORAL | Status: DC

## 2011-12-20 NOTE — Assessment & Plan Note (Signed)

## 2011-12-20 NOTE — Assessment & Plan Note (Signed)
Ok to check iron today

## 2011-12-20 NOTE — Patient Instructions (Addendum)
You had the flu shot today Take all new medications as prescribed - the nsaid and muscle relaxer Continue all other medications as before Your refills are done today as requested Please go to LAB in the Basement for the blood and/or urine tests to be done today You will be contacted by phone if any changes need to be made immediately.  Otherwise, you will receive a letter about your results with an explanation, but please check with MyChart first. Thank you for enrolling in MyChart. Please follow the instructions below to securely access your online medical record. MyChart allows you to send messages to your doctor, view your test results, renew your prescriptions, schedule appointments, and more. To Log into MyChart, please go to [email or web address], and your Username is:  Jodi Keller will be contacted regarding the referral for: overnight oximetry to make sure about your oxygen level is ok at night You are otherwise up to date with prevention measures Please return in 6 months, or sooner if needed

## 2011-12-20 NOTE — Assessment & Plan Note (Signed)
Minor, ? Clinical significance, and only occas daytime somnolence, but given her habitus will check ONO to r/o nighttime hypoxemia

## 2011-12-20 NOTE — Assessment & Plan Note (Signed)
Asymtp, for a1c today 

## 2011-12-20 NOTE — Progress Notes (Signed)
Subjective:    Patient ID: Jodi Keller, female    DOB: 1955-09-30, 56 y.o.   MRN: [id number]  HPI  Here for wellness and f/u;  Overall doing ok;  Pt denies CP, worsening SOB, DOE, wheezing, orthopnea, PND, worsening LE edema, palpitations, dizziness or syncope.  Pt denies neurological change such as new Headache, facial or extremity weakness.  Pt denies polydipsia, polyuria, or low sugar symptoms. Pt states overall good compliance with treatment and medications, good tolerability, and trying to follow lower cholesterol diet.  Pt denies worsening depressive symptoms, suicidal ideation or panic. No fever, wt loss, night sweats, loss of appetite, or other constitutional symptoms.  Pt states good ability with ADL's, low fall risk, home safety reviewed and adequate, no significant changes in hearing or vision, and occasionally active with exercise.  Has chronic left > right ankle pain, sees [place] [facility] over the summer, may need left ankle replacement eventually.  Has ongoing nighttime cramp to legs despite the 10 mg flexeril.    Needs mult med refills today Past Medical History  Diagnosis Date  . Hypertension   . Anemia   . GERD (gastroesophageal reflux disease)   . Morbid obesity   . Uterine prolapse   . Bilateral carpal tunnel syndrome 12/16/2010  . 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 Hyperlipidemia 02/25/2011   Past Surgical History  Procedure Date  . Cesarean section   . Foot surgery     reports that she has never smoked. She does not have any smokeless tobacco history on file. She reports that she drinks alcohol. She reports that she does not use illicit drugs. family history includes Cancer in her father and sisters; Diabetes in her maternal grandmother; and Thyroid disease in her other. No Known Allergies Current Outpatient Prescriptions on File Prior to Visit  Medication Sig Dispense Refill  . Cholecalciferol (VITAMIN D PO) Take 1 tablet by mouth daily as needed. Often  forgets to take and doesn't know strength      . [DISCONTINUED] benazepril (LOTENSIN) 20 MG tablet TAKE ONE TABLET BY MOUTH ONE TIME DAILY  30 tablet  11  . [DISCONTINUED] furosemide (LASIX) 40 MG tablet Take 40 mg by mouth daily. Usually takes once per week      . [DISCONTINUED] omeprazole (PRILOSEC) 20 MG capsule TAKE ONE CAPSULE BY MOUTH ONE TIME DAILY  30 capsule  1  . [DISCONTINUED] potassium chloride (K-DUR,KLOR-CON) 10 MEQ tablet Take 10 mEq by mouth daily. Pt says she only takes about once/week.      [name] VITAMIN E PO Take 1 capsule by mouth daily as needed. Takes when she remembers and doesn't know strength.      . [DISCONTINUED] benazepril (LOTENSIN) 20 MG tablet Take 1 tablet (20 mg total) by mouth daily.  90 tablet  3   Review of Systems Review of Systems  Constitutional: Negative for diaphoresis, activity change, appetite change and unexpected weight change.  HENT: Negative for hearing loss, ear pain, facial swelling, mouth sores and neck stiffness.   Eyes: Negative for pain, redness and visual disturbance.  Respiratory: Negative for shortness of breath and wheezing.   Cardiovascular: Negative for chest pain and palpitations.  Gastrointestinal: Negative for diarrhea, blood in stool, abdominal distention and rectal pain.  Genitourinary: Negative for hematuria, flank pain and decreased urine volume.  Musculoskeletal: Negative for myalgias and joint swelling.  Skin: Negative for color change and wound.  Neurological: Negative for syncope and numbness.  Hematological: Negative for adenopathy.  Psychiatric/Behavioral: Negative for hallucinations, self-injury, decreased concentration and agitation.      Objective:   Physical Exam BP 130/72  Pulse 85  Temp 97.4 F (36.3 C) (Oral)  Ht 5\' 7"  (1.702 m)  Wt 363 lb (164.656 kg)  BMI 56.85 kg/m2  SpO2 96%  LMP 01/12/2011 Physical Exam  VS noted Constitutional: Pt is oriented to person, place, and time. Appears well-developed and  well-nourished. /morbid obese Head: Normocephalic and atraumatic.  Right Ear: External ear normal.  Left Ear: External ear normal.  Nose: Nose normal.  Mouth/Throat: Oropharynx is clear and moist.  Eyes: Conjunctivae and EOM are normal. Pupils are equal, round, and reactive to light.  Neck: Normal range of motion. Neck supple. No JVD present. No tracheal deviation present.  Cardiovascular: Normal rate, regular rhythm, normal heart sounds and intact distal pulses.   Pulmonary/Chest: Effort normal and breath sounds normal.  Abdominal: Soft. Bowel sounds are normal. There is no tenderness.  Musculoskeletal: Normal range of motion. Exhibits no edema.  Lymphadenopathy:  Has no cervical adenopathy.  Neurological: Pt is alert and oriented to person, place, and time. Pt has normal reflexes. No cranial nerve deficit.  Skin: Skin is warm and dry. No rash noted.  Psychiatric:  Has  normal mood and affect. Behavior is normal.     Assessment & Plan:

## 2011-12-23 ENCOUNTER — Other Ambulatory Visit: Payer: Self-pay | Admitting: Internal Medicine

## 2011-12-29 ENCOUNTER — Encounter: Payer: Self-pay | Admitting: Internal Medicine

## 2011-12-29 ENCOUNTER — Telehealth: Payer: Self-pay | Admitting: Internal Medicine

## 2011-12-29 NOTE — Telephone Encounter (Signed)
Per pt email:  Sorry for so slow to get back to you  The results did show that over about 8 hrs, at least 90 minutes showed lower than normal oxygen levels, which can mean sleep apnea  Can I refer you to Pulmonary as I think you may have sleep apnea or similar problem and may do better with treatment at least at night.  thanks ===View-only below this line===   ----- Message -----    From: [name]    Sent: 12/29/2011  8:32 AM EST      To: Jodi Barre, MD Subject: Visit Follow-Up Question  Hi Dr. [name], I was wondering if the results of the sleep test I did was in?  Im curious to know. Thanks, Jodi Keller

## 2012-01-10 ENCOUNTER — Encounter: Payer: Self-pay | Admitting: Internal Medicine

## 2012-04-09 ENCOUNTER — Other Ambulatory Visit: Payer: Self-pay | Admitting: Internal Medicine

## 2012-06-25 ENCOUNTER — Ambulatory Visit: Payer: BC Managed Care – PPO | Admitting: Internal Medicine

## 2012-06-26 ENCOUNTER — Encounter: Payer: Self-pay | Admitting: Internal Medicine

## 2012-06-26 ENCOUNTER — Ambulatory Visit (INDEPENDENT_AMBULATORY_CARE_PROVIDER_SITE_OTHER): Payer: No Typology Code available for payment source | Admitting: Internal Medicine

## 2012-06-26 VITALS — BP 112/82 | HR 87 | Temp 98.4°F | Ht 67.0 in | Wt 391.2 lb

## 2012-06-26 DIAGNOSIS — I10 Essential (primary) hypertension: Secondary | ICD-10-CM

## 2012-06-26 DIAGNOSIS — H6980 Other specified disorders of Eustachian tube, unspecified ear: Secondary | ICD-10-CM

## 2012-06-26 DIAGNOSIS — R42 Dizziness and giddiness: Secondary | ICD-10-CM

## 2012-06-26 DIAGNOSIS — H6982 Other specified disorders of Eustachian tube, left ear: Secondary | ICD-10-CM

## 2012-06-26 DIAGNOSIS — J309 Allergic rhinitis, unspecified: Secondary | ICD-10-CM

## 2012-06-26 MED ORDER — METHYLPREDNISOLONE ACETATE 80 MG/ML IJ SUSP
80.0000 mg | Freq: Once | INTRAMUSCULAR | Status: AC
  Administered 2012-06-26: 80 mg via INTRAMUSCULAR

## 2012-06-26 MED ORDER — CLOTRIMAZOLE-BETAMETHASONE 1-0.05 % EX CREA: TOPICAL_CREAM | CUTANEOUS | Status: DC

## 2012-06-26 MED ORDER — PREDNISONE 10 MG PO TABS: ORAL_TABLET | ORAL | Status: DC

## 2012-06-26 NOTE — Assessment & Plan Note (Signed)
Mild intermittent positional, likely related I suspect to left inner ear involvement, declines meclizine prn

## 2012-06-26 NOTE — Assessment & Plan Note (Signed)
stable overall by history and exam, recent data reviewed with pt, and pt to continue medical treatment as before,  to f/u any worsening symptoms or concerns BP Readings from Last 3 Encounters:  06/26/12 112/82  12/20/11 130/72  07/21/11 112/72

## 2012-06-26 NOTE — Assessment & Plan Note (Signed)
For mucinex otc prn,  to f/u any worsening symptoms or concerns  

## 2012-06-26 NOTE — Progress Notes (Signed)
Subjective:    Patient ID: Jodi Keller, female    DOB: Oct 08, 1955, 57 y.o.   MRN: [id number]  HPI  Here to f/u - Does have several wks ongoing nasal allergy symptoms with clearish congestion, itch and sneezing, without fever, pain, ST, cough, swelling, but had mild onset mild wheezing last PM, as well as intermittent positional vertigo/dizziness for 3 days.  Also with a severe HA onset in AM 2 days ago for most of the day, stayed in bed.  Pt denies fever, wt loss, night sweats, loss of appetite, or other constitutional symptoms  Pt denies chest pain, increased sob or doe, wheezing, orthopnea, PND, increased LE swelling, palpitations, dizziness or syncope except for the above.   Past Medical History  Diagnosis Date  . Hypertension   . Anemia   . GERD (gastroesophageal reflux disease)   . Morbid obesity   . Uterine prolapse   . Bilateral carpal tunnel syndrome 12/16/2010  . 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 Hyperlipidemia 02/25/2011   Past Surgical History  Procedure Laterality Date  . Cesarean section    . Foot surgery      reports that she has never smoked. She does not have any smokeless tobacco history on file. She reports that  drinks alcohol. She reports that she does not use illicit drugs. family history includes Cancer in her father and sisters; Diabetes in her maternal grandmother; and Thyroid disease in her other. No Known Allergies Current Outpatient Prescriptions on File Prior to Visit  Medication Sig Dispense Refill  . benazepril (LOTENSIN) 20 MG tablet Take 1 tablet (20 mg total) by mouth daily.  90 tablet  3  . Cholecalciferol (VITAMIN D PO) Take 1 tablet by mouth daily as needed. Often forgets to take and doesn't know strength      . cyclobenzaprine (FLEXERIL) 10 MG tablet TAKE ONE TABLET BY MOUTH THREE TIMES DAILY AS NEEDED FOR MUSCLE SPASM  30 tablet  4  . naproxen (NAPROSYN) 500 MG tablet Take 1 tablet (500 mg total) by mouth 2 (two) times daily as  needed.  60 tablet  5  . omeprazole (PRILOSEC) 20 MG capsule Take 1 capsule (20 mg total) by mouth daily.  90 capsule  3  . potassium chloride (K-DUR,KLOR-CON) 10 MEQ tablet Take 1 tablet (10 mEq total) by mouth daily. Pt says she only takes about once/week.  90 tablet  3  . VITAMIN E PO Take 1 capsule by mouth daily as needed. Takes when she remembers and doesn't know strength.      . carisoprodol (SOMA) 350 MG tablet Take 1 tablet (350 mg total) by mouth 2 (two) times daily as needed for muscle spasms.  60 tablet  2  . furosemide (LASIX) 40 MG tablet Take 1 tablet (40 mg total) by mouth daily as needed. Usually takes once per week  90 tablet  3   No current facility-administered medications on file prior to visit.   Review of Systems  Constitutional: Negative for unexpected weight change, or unusual diaphoresis  HENT: Negative for tinnitus.   Eyes: Negative for photophobia and visual disturbance.  Respiratory: Negative for choking and stridor.   Gastrointestinal: Negative for vomiting and blood in stool.  Genitourinary: Negative for hematuria and decreased urine volume.  Musculoskeletal: Negative for acute joint swelling Skin: Negative for color change and wound.  Neurological: Negative for tremors and numbness other than noted  Psychiatric/Behavioral: Negative for decreased concentration or  hyperactivity.  Objective:   Physical Exam BP 112/82  Pulse 87  Temp(Src) 98.4 F (36.9 C) (Oral)  Ht 5\' 7"  (1.702 m)  Wt 391 lb 4 oz (177.47 kg)  BMI 61.26 kg/m2  SpO2 97%  LMP 01/12/2011 VS noted, non toxic Constitutional: Pt appears well-developed and well-nourished.  HENT: Head: NCAT.  Right Ear: External ear normal.  Left Ear: External ear normal.  Bilat tm's with mild erythema with left severe, right mild.  Max sinus areas non tender.  Pharynx with mild erythema, no exudate Eyes: Conjunctivae and EOM are normal. Pupils are equal, round, and reactive to light.  Neck: Normal  range of motion. Neck supple.  Cardiovascular: Normal rate and regular rhythm.   Pulmonary/Chest: Effort normal and breath sounds normal.  Neurological: Pt is alert. Not confused  Skin: Skin is warm. No erythema.  Psychiatric: Pt behavior is normal. Thought content normal.     Assessment & Plan:

## 2012-06-26 NOTE — Assessment & Plan Note (Signed)
Most likely the primary issue today, for depomedrol IM, predpack asd,  to f/u any worsening symptoms or concerns

## 2012-06-26 NOTE — Patient Instructions (Signed)
You had the steroid shot today Please take all new medication as prescribed - the prednisone You can also take Mucinex (or it's generic off brand) for congestion and ear discomfort, and tylenol as needed for pain. Please also consider taking OTC Zyrtec as needed for symptoms in the future Please continue all other medications as before, and refills have been done if requested. - the cream

## 2012-07-02 ENCOUNTER — Ambulatory Visit: Payer: BC Managed Care – PPO | Admitting: Internal Medicine

## 2012-09-06 ENCOUNTER — Other Ambulatory Visit: Payer: Self-pay | Admitting: Obstetrics and Gynecology

## 2012-10-02 ENCOUNTER — Other Ambulatory Visit: Payer: Self-pay | Admitting: Obstetrics and Gynecology

## 2012-10-02 DIAGNOSIS — Z1231 Encounter for screening mammogram for malignant neoplasm of breast: Secondary | ICD-10-CM

## 2012-10-04 ENCOUNTER — Ambulatory Visit (HOSPITAL_COMMUNITY)
Admission: RE | Admit: 2012-10-04 | Discharge: 2012-10-04 | Disposition: A | Discharge status: Home / Self Care | Payer: No Typology Code available for payment source | Source: Ambulatory Visit | Attending: Obstetrics and Gynecology | Admitting: Obstetrics and Gynecology

## 2012-10-04 DIAGNOSIS — Z1231 Encounter for screening mammogram for malignant neoplasm of breast: Secondary | ICD-10-CM | POA: Insufficient documentation

## 2012-10-07 ENCOUNTER — Other Ambulatory Visit: Payer: Self-pay | Admitting: Obstetrics and Gynecology

## 2012-10-07 DIAGNOSIS — R928 Other abnormal and inconclusive findings on diagnostic imaging of breast: Secondary | ICD-10-CM

## 2012-10-23 ENCOUNTER — Ambulatory Visit
Admission: RE | Admit: 2012-10-23 | Discharge: 2012-10-23 | Disposition: A | Discharge status: Home / Self Care | Payer: No Typology Code available for payment source | Source: Ambulatory Visit | Attending: Obstetrics and Gynecology | Admitting: Obstetrics and Gynecology

## 2012-10-23 DIAGNOSIS — R928 Other abnormal and inconclusive findings on diagnostic imaging of breast: Secondary | ICD-10-CM

## 2012-10-31 ENCOUNTER — Other Ambulatory Visit: Payer: Self-pay | Admitting: Internal Medicine

## 2012-10-31 NOTE — Telephone Encounter (Signed)
Done hardcopy to [facility]  

## 2012-10-31 NOTE — Telephone Encounter (Signed)
Faxed hardcopy to [facility] [place] 

## 2012-11-28 ENCOUNTER — Other Ambulatory Visit: Payer: Self-pay

## 2012-11-29 ENCOUNTER — Other Ambulatory Visit: Payer: Self-pay | Admitting: Internal Medicine

## 2012-11-29 MED ORDER — CLOTRIMAZOLE-BETAMETHASONE 1-0.05 % EX CREA: TOPICAL_CREAM | CUTANEOUS | Status: DC

## 2012-12-26 ENCOUNTER — Other Ambulatory Visit: Payer: Self-pay | Admitting: Internal Medicine

## 2013-03-26 ENCOUNTER — Other Ambulatory Visit: Payer: Self-pay | Admitting: Internal Medicine

## 2013-05-10 ENCOUNTER — Other Ambulatory Visit: Payer: Self-pay | Admitting: Internal Medicine

## 2013-05-12 NOTE — Telephone Encounter (Signed)
Done hardcopy to [facility]  

## 2013-05-12 NOTE — Telephone Encounter (Signed)
[facility] to [facility]...Jodi Keller

## 2013-07-04 ENCOUNTER — Ambulatory Visit (INDEPENDENT_AMBULATORY_CARE_PROVIDER_SITE_OTHER): Payer: BC Managed Care – PPO | Admitting: Internal Medicine

## 2013-07-04 ENCOUNTER — Encounter: Payer: Self-pay | Admitting: Internal Medicine

## 2013-07-04 VITALS — BP 132/86 | HR 95 | Temp 98.7°F | Wt 397.0 lb

## 2013-07-04 DIAGNOSIS — H6690 Otitis media, unspecified, unspecified ear: Secondary | ICD-10-CM

## 2013-07-04 DIAGNOSIS — J309 Allergic rhinitis, unspecified: Secondary | ICD-10-CM

## 2013-07-04 DIAGNOSIS — H6692 Otitis media, unspecified, left ear: Secondary | ICD-10-CM | POA: Insufficient documentation

## 2013-07-04 DIAGNOSIS — I10 Essential (primary) hypertension: Secondary | ICD-10-CM

## 2013-07-04 DIAGNOSIS — H6980 Other specified disorders of Eustachian tube, unspecified ear: Secondary | ICD-10-CM

## 2013-07-04 MED ORDER — PREDNISONE 10 MG PO TABS: ORAL_TABLET | ORAL | Status: DC

## 2013-07-04 MED ORDER — CEFUROXIME AXETIL 250 MG PO TABS: 250.0000 mg | ORAL_TABLET | Freq: Two times a day (BID) | ORAL | Status: DC

## 2013-07-04 NOTE — Assessment & Plan Note (Signed)
Also for mucinex otc prn 

## 2013-07-04 NOTE — Patient Instructions (Addendum)
Please take all new medication as prescribed - the antibiotic, and the prednisone  You can also take Delsym OTC for cough, and/or Mucinex (or it's generic off brand) for congestion, and tylenol as needed for pain.  Please continue all other medications as before, and refills have been done if requested.  Please have the pharmacy call with any other refills you may need.  Please continue your efforts at being more active, low cholesterol diet, and weight control.

## 2013-07-04 NOTE — Assessment & Plan Note (Signed)
Pt requests low dose short course predpac - done

## 2013-07-04 NOTE — Assessment & Plan Note (Signed)
Mild to mod, for antibx course,  to f/u any worsening symptoms or concerns 

## 2013-07-04 NOTE — Progress Notes (Signed)
Subjective:    Patient ID: Jodi Keller, female    DOB: 01/20/1956, 58 y.o.   MRN: [id number]  HPI   Here with 3 days acute onset left > right pain, with low grade temp, dizziness, general HA,  general weakness and malaise, also Does have several wks ongoing nasal allergy symptoms with clearish congestion, itch and sneezing, without fever, pain, ST, cough, swelling or wheezing.   Past Medical History  Diagnosis Date  . Hypertension   . Anemia   . GERD (gastroesophageal reflux disease)   . Morbid obesity   . Uterine prolapse   . Bilateral carpal tunnel syndrome 12/16/2010  . 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 Hyperlipidemia 02/25/2011   Past Surgical History  Procedure Laterality Date  . Cesarean section    . Foot surgery      reports that she has never smoked. She does not have any smokeless tobacco history on file. She reports that she drinks alcohol. She reports that she does not use illicit drugs. family history includes Cancer in her father, sister, and sister; Diabetes in her maternal grandmother; Thyroid disease in her other. No Known Allergies Current Outpatient Prescriptions on File Prior to Visit  Medication Sig Dispense Refill  . benazepril (LOTENSIN) 20 MG tablet Take one tablet by mouth one time daily  90 tablet  1  . carisoprodol (SOMA) 350 MG tablet TAKE ONE TABLET BY MOUTH TWICE DAILY AS NEEDED FOR MUSCLE SPASMS  60 tablet  0  . Cholecalciferol (VITAMIN D PO) Take 1 tablet by mouth daily as needed. Often forgets to take and doesn't know strength      . clotrimazole-betamethasone (LOTRISONE) cream USE AS DIRECTED TWICE DAILY AS NEEDED  15 g  6  . cyclobenzaprine (FLEXERIL) 10 MG tablet TAKE ONE TABLET BY MOUTH THREE TIMES DAILY AS NEEDED FOR MUSCLE SPASM  30 tablet  4  . furosemide (LASIX) 40 MG tablet Take 1 tablet (40 mg total) by mouth daily as needed. Usually takes once per week  90 tablet  3  . naproxen (NAPROSYN) 500 MG tablet Take 1 tablet  (500 mg total) by mouth 2 (two) times daily as needed.  60 tablet  5  . omeprazole (PRILOSEC) 20 MG capsule Take one capsule by mouth one time daily  30 capsule  11  . potassium chloride (K-DUR,KLOR-CON) 10 MEQ tablet TAKE ONE TABLET BY MOUTH ONE TIME DAILY  30 tablet  2  . VITAMIN E PO Take 1 capsule by mouth daily as needed. Takes when she remembers and doesn't know strength.       No current facility-administered medications on file prior to visit.   Review of Systems  Constitutional: Negative for unusual diaphoresis or other sweats  HENT: Negative for ringing in ear Eyes: Negative for double vision or worsening visual disturbance.  Respiratory: Negative for choking and stridor.   Gastrointestinal: Negative for vomiting or other signifcant bowel change Genitourinary: Negative for hematuria or decreased urine volume.  Musculoskeletal: Negative for other MSK pain or swelling Skin: Negative for color change and worsening wound.  Neurological: Negative for tremors and numbness other than noted  Psychiatric/Behavioral: Negative for decreased concentration or agitation other than above       Objective:   Physical Exam BP 132/86  Pulse 95  Temp(Src) 98.7 F (37.1 C) (Oral)  Wt 397 lb (180.078 kg)  SpO2 98%  LMP 01/12/2011 VS noted,  Constitutional: Pt appears well-developed, well-nourished.  HENT: Head: NCAT.  Right Ear: External ear normal.  Left Ear: External ear normal.  Left tm's with severe erythema., Right tm mild erythema only, Max sinus areas mild tender.  Pharynx with mild erythema, no exudate Eyes: . Pupils are equal, round, and reactive to light. Conjunctivae and EOM are normal Neck: Normal range of motion. Neck supple.  Cardiovascular: Normal rate and regular rhythm.   Pulmonary/Chest: Effort normal and breath sounds normal.  Neurological: Pt is alert. Not confused , motor grossly intact Skin: Skin is warm. No rash Psychiatric: Pt behavior is normal. No agitation.  mild nervous, not depressed affect     Assessment & Plan:

## 2013-07-04 NOTE — Progress Notes (Signed)
Pre visit review using our clinic review tool, if applicable. No additional management support is needed unless otherwise documented below in the visit note. 

## 2013-07-04 NOTE — Assessment & Plan Note (Signed)
stable overall by history and exam, recent data reviewed with pt, and pt to continue medical treatment as before,  to f/u any worsening symptoms or concerns BP Readings from Last 3 Encounters:  07/04/13 132/86  06/26/12 112/82  12/20/11 130/72

## 2013-07-05 ENCOUNTER — Telehealth: Payer: Self-pay | Admitting: Internal Medicine

## 2013-07-05 NOTE — Telephone Encounter (Signed)
Relevant patient education assigned to patient using Emmi. ° °

## 2013-08-08 ENCOUNTER — Other Ambulatory Visit (INDEPENDENT_AMBULATORY_CARE_PROVIDER_SITE_OTHER): Payer: BC Managed Care – PPO

## 2013-08-08 ENCOUNTER — Encounter: Payer: Self-pay | Admitting: Internal Medicine

## 2013-08-08 ENCOUNTER — Ambulatory Visit (INDEPENDENT_AMBULATORY_CARE_PROVIDER_SITE_OTHER): Payer: BC Managed Care – PPO | Admitting: Internal Medicine

## 2013-08-08 VITALS — BP 130/86 | HR 96 | Temp 98.3°F | Wt >= 6400 oz

## 2013-08-08 DIAGNOSIS — E049 Nontoxic goiter, unspecified: Secondary | ICD-10-CM

## 2013-08-08 DIAGNOSIS — I10 Essential (primary) hypertension: Secondary | ICD-10-CM

## 2013-08-08 DIAGNOSIS — R609 Edema, unspecified: Secondary | ICD-10-CM

## 2013-08-08 DIAGNOSIS — H6983 Other specified disorders of Eustachian tube, bilateral: Secondary | ICD-10-CM

## 2013-08-08 DIAGNOSIS — H6980 Other specified disorders of Eustachian tube, unspecified ear: Secondary | ICD-10-CM

## 2013-08-08 LAB — BASIC METABOLIC PANEL
BUN: 17 mg/dL (ref 6–23)
CALCIUM: 8.9 mg/dL (ref 8.4–10.5)
CO2: 28 mEq/L (ref 19–32)
CREATININE: 0.9 mg/dL (ref 0.4–1.2)
Chloride: 106 mEq/L (ref 96–112)
GFR: 72.99 mL/min (ref >60.00)
Glucose, Bld: 92 mg/dL (ref 70–99)
Potassium: 4.5 mEq/L (ref 3.5–5.1)
Sodium: 140 mEq/L (ref 135–145)

## 2013-08-08 LAB — TSH: TSH: 3 u[IU]/mL (ref 0.35–4.50)

## 2013-08-08 MED ORDER — HYDROCHLOROTHIAZIDE 12.5 MG PO CAPS: 12.5000 mg | ORAL_CAPSULE | Freq: Every day | ORAL | Status: DC

## 2013-08-08 NOTE — Progress Notes (Signed)
   Subjective:    Patient ID: YEILY LINK, female    DOB: 18-Jul-1955, 58 y.o.   MRN: [id number]  HPI  The last 10 days she's had pressure in both ears which waxes and wanes in intensity. She will have a low-grade constant ache in both ears.  She had a similar problem one month ago which was treated with antibiotics with resolution. She's had associated head and chest congestion. She also describes itchy, watery eyes and sneezing.  She does have some right frontal discomfort intermittently.   Review of Systems  She denies persistent frontal sinus pain, facial pain, dental pain, sore throat, nasal purulence, otic discharge, or sputum production.  She also denies shortness of breath, fever, chills, or sweats.  She has edema but is not taking her Lasix as it causes muscle cramps. She is unsure when she last took it.     Objective:   Physical Exam  Significant or distinguishing  findings on physical exam are documented first.  Below that are other systems examined & findings.   Morbid obesity is present.  Tympanic membranes are dull without bulging or erythema.  The nasal septum is deviated to the right  A large goiter is suggested on the right.  Breath sounds are decreased without  wheezing, rhonchi, or rales.  She has 1.5+ pitting edema of the lower extremities.  Eyes: No conjunctival inflammation or scleral icterus is present.  Oral exam: Dental hygiene is good; lips and gums are healthy appearing.There is no oropharyngeal erythema or exudate noted.   Heart:  Normal rate and regular rhythm. S1 and S2 normal without gallop, murmur, click, rub or other extra sounds     Lungs:.No increased work of breathing.   Abdomen:Protubrerant; bowel sounds normal, soft and non-tender without masses, organomegaly or hernias noted.  No guarding or rebound .   Musculoskeletal: Able to lie flat and sit up without help. Negative straight leg raising bilaterally. Gait normal  Skin:Warm  & dry.  Intact without suspicious lesions or rashes ; no jaundice or tenting  Lymphatic: No lymphadenopathy is noted about the head, neck, axilla                Assessment & Plan:  #1 eustachian tube dysfunction bilateral  #2 hypertension, uncontrolled  #3 edema, noncompliance with the diuretic  #4 goiter suggested clinically  Plan: See orders and recommendations

## 2013-08-08 NOTE — Progress Notes (Signed)
Pre visit review using our clinic review tool, if applicable. No additional management support is needed unless otherwise documented below in the visit note. 

## 2013-08-08 NOTE — Patient Instructions (Signed)
Your next office appointment will be determined based upon review of your pending labs . Those instructions will be transmitted to you through My Chart  OR  by mail;whichever process is your choice to receive results & recommendations .  Plain Mucinex (NOT D) for thick secretions ;force NON dairy fluids .   Nasal cleansing in the shower as discussed with lather of mild shampoo.After 10 seconds wash off lather while  exhaling through nostrils. Make sure that all residual soap is removed to prevent irritation.  Flonase OR Nasacort AQ 1 spray in each nostril twice a day as needed. Use the "crossover" technique into opposite nostril spraying toward opposite ear @ 45 degree angle, not straight up into nostril.  Use a Neti pot daily only  as needed for significant sinus congestion; going from open side to congested side . Plain Allegra (NOT D )  160 daily , Loratidine 10 mg , OR Zyrtec 10 mg @ bedtime  as needed for itchy eyes & sneezing.  Minimal Blood Pressure Goal= AVERAGE < 140/90;  Ideal is an AVERAGE < 135/85. This AVERAGE should be calculated from @ least 5-7 BP readings taken @ different times of day on different days of week. You should not respond to isolated BP readings , but rather the AVERAGE for that week .Please bring your  blood pressure cuff to office visits to verify that it is reliable.It  can also be checked against the blood pressure device at the pharmacy. Finger or wrist cuffs are not dependable; an arm cuff is.

## 2013-10-08 ENCOUNTER — Other Ambulatory Visit: Payer: Self-pay | Admitting: Internal Medicine

## 2013-10-08 NOTE — Telephone Encounter (Signed)
Done hardcopy to [facility]  

## 2013-10-08 NOTE — Telephone Encounter (Signed)
Faxed hardcopy for Carisoprodol to [facility] in [place]>

## 2013-11-07 ENCOUNTER — Other Ambulatory Visit: Payer: Self-pay

## 2013-12-17 ENCOUNTER — Encounter: Payer: Self-pay | Admitting: Internal Medicine

## 2013-12-17 ENCOUNTER — Ambulatory Visit: Payer: BC Managed Care – PPO | Admitting: Internal Medicine

## 2013-12-17 ENCOUNTER — Ambulatory Visit (INDEPENDENT_AMBULATORY_CARE_PROVIDER_SITE_OTHER): Payer: BC Managed Care – PPO | Admitting: Internal Medicine

## 2013-12-17 VITALS — BP 140/88 | HR 80 | Temp 97.8°F | Ht 67.0 in | Wt 397.5 lb

## 2013-12-17 DIAGNOSIS — I10 Essential (primary) hypertension: Secondary | ICD-10-CM

## 2013-12-17 DIAGNOSIS — R7302 Impaired glucose tolerance (oral): Secondary | ICD-10-CM

## 2013-12-17 DIAGNOSIS — M25562 Pain in left knee: Secondary | ICD-10-CM

## 2013-12-17 MED ORDER — OXYCODONE HCL 5 MG PO TABS: 5.0000 mg | ORAL_TABLET | Freq: Four times a day (QID) | ORAL | Status: DC | PRN

## 2013-12-17 MED ORDER — PREDNISONE 10 MG PO TABS: 10.0000 mg | ORAL_TABLET | Freq: Every day | ORAL | Status: DC

## 2013-12-17 NOTE — Assessment & Plan Note (Signed)
?   Bakers cyst vs meniscal tear or both, vs other - for pain control/oxycodone limited rx, refer sport med/dr Tamala Julian for ultrasound

## 2013-12-17 NOTE — Progress Notes (Signed)
Subjective:    Patient ID: Jodi Keller, female    DOB: [birth year], 58 y.o.   MRN: [id number]  HPI  Here with c/o approx 10 days onset left knee pain and swelling without fever, overt trauma, giveaways or falls.  Saw ortho 1 wk ago at [facility], tx with predpac, felt improved initially but now pain persists even worse, had knee xray, marked arthritis per pt report as it was related to her, and states pt told by ortho she had "fluid backed up" towards the back of the knee.  Pain primarily to post knee, also lateral aspect as well. Pt denies chest pain, increased sob or doe, wheezing, orthopnea, PND, increased LE swelling, palpitations, dizziness or syncope.  Pt denies fever, wt loss, night sweats, loss of appetite, or other constitutional symptoms No hx of gout Past Medical History  Diagnosis Date  . Hypertension   . Anemia   . GERD (gastroesophageal reflux disease)   . Morbid obesity   . Uterine prolapse   . Bilateral carpal tunnel syndrome 12/16/2010  . 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 Hyperlipidemia 02/25/2011   Past Surgical History  Procedure Laterality Date  . Cesarean section    . Foot surgery      reports that she has never smoked. She does not have any smokeless tobacco history on file. She reports that she drinks alcohol. She reports that she does not use illicit drugs. family history includes Cancer in her father, sister, and sister; Diabetes in her maternal grandmother; Thyroid disease in her other. No Known Allergies Current Outpatient Prescriptions on File Prior to Visit  Medication Sig Dispense Refill  . benazepril (LOTENSIN) 20 MG tablet Take one tablet by mouth one time daily 90 tablet 1  . carisoprodol (SOMA) 350 MG tablet TAKE ONE TABLET BY MOUTH TWICE A DAY AS NEEDED FOR MUSCLE SPASMS  60 tablet 1  . Cholecalciferol (VITAMIN D PO) Take 1 tablet by mouth daily as needed. Often forgets to take and doesn't know strength    .  clotrimazole-betamethasone (LOTRISONE) cream USE AS DIRECTED TWICE DAILY AS NEEDED 15 g 6  . hydrochlorothiazide (MICROZIDE) 12.5 MG capsule Take 1 capsule (12.5 mg total) by mouth daily. 30 capsule 5  . omeprazole (PRILOSEC) 20 MG capsule Take one capsule by mouth one time daily 30 capsule 11  . potassium chloride (K-DUR,KLOR-CON) 10 MEQ tablet TAKE ONE TABLET BY MOUTH ONE TIME DAILY 30 tablet 2  . VITAMIN E PO Take 1 capsule by mouth daily as needed. Takes when she remembers and doesn't know strength.     No current facility-administered medications on file prior to visit.   Review of Systems  Constitutional: Negative for unusual diaphoresis or other sweats  HENT: Negative for ringing in ear Eyes: Negative for double vision or worsening visual disturbance.  Respiratory: Negative for choking and stridor.   Gastrointestinal: Negative for vomiting or other signifcant bowel change Genitourinary: Negative for hematuria or decreased urine volume.  Musculoskeletal: Negative for other MSK pain or swelling Skin: Negative for color change and worsening wound.  Neurological: Negative for tremors and numbness other than noted  Psychiatric/Behavioral: Negative for decreased concentration or agitation other than above       Objective:   Physical Exam BP 140/88 mmHg  Pulse 80  Temp(Src) 97.8 F (36.6 C) (Oral)  Ht 5\' 7"  (1.702 m)  Wt 397 lb 8 oz (180.305 kg)  BMI 62.24 kg/m2  SpO2 97%  LMP 01/12/2011  VS noted,  Constitutional: Pt appears well-developed, well-nourished.  HENT: Head: NCAT.  Right Ear: External ear normal.  Left Ear: External ear normal.  Eyes: . Pupils are equal, round, and reactive to light. Conjunctivae and EOM are normal Neck: Normal range of motion. Neck supple.  Cardiovascular: Normal rate and regular rhythm.   Pulmonary/Chest: Effort normal and breath sounds normal.  Neurological: Pt is alert. Not confused , motor grossly intact Skin: Skin is warm. No  rash Psychiatric: Pt behavior is normal. No agitation.  Left knee with post tenderness/swelling, tender lateral joint line, 1-2+ effusion    Assessment & Plan:

## 2013-12-17 NOTE — Assessment & Plan Note (Signed)
stable overall by history and exam, recent data reviewed with pt, and pt to continue medical treatment as before,  to f/u any worsening symptoms or concerns BP Readings from Last 3 Encounters:  12/17/13 140/88  08/08/13 130/86  07/04/13 132/86

## 2013-12-17 NOTE — Assessment & Plan Note (Signed)
asymtp with recent prednisone, for repeat predpack asd,  to f/u any worsening symptoms or concerns, to call for onset polys or cbg > 200

## 2013-12-17 NOTE — Progress Notes (Signed)
Pre visit review using our clinic review tool, if applicable. No additional management support is needed unless otherwise documented below in the visit note. 

## 2013-12-17 NOTE — Patient Instructions (Signed)
Please take all new medication as prescribed - the pain medication, and the prednisone  Please continue all other medications as before, and refills have been done if requested.  Please have the pharmacy call with any other refills you may need.  Please keep your appointments with your specialists as you may have planned  OK to make appt as you leave ASAP with Dr Smith/sport medicine for the left knee pain

## 2013-12-27 ENCOUNTER — Other Ambulatory Visit: Payer: Self-pay | Admitting: Internal Medicine

## 2013-12-30 NOTE — Telephone Encounter (Signed)
Done hardcopy to MM 

## 2014-01-02 ENCOUNTER — Ambulatory Visit: Payer: BC Managed Care – PPO | Admitting: Family Medicine

## 2014-01-06 ENCOUNTER — Other Ambulatory Visit (HOSPITAL_COMMUNITY): Payer: Self-pay | Admitting: Obstetrics and Gynecology

## 2014-01-06 DIAGNOSIS — Z1231 Encounter for screening mammogram for malignant neoplasm of breast: Secondary | ICD-10-CM

## 2014-01-15 ENCOUNTER — Ambulatory Visit (HOSPITAL_COMMUNITY)
Admission: RE | Admit: 2014-01-15 | Discharge: 2014-01-15 | Disposition: A | Discharge status: Home / Self Care | Payer: BC Managed Care – PPO | Source: Ambulatory Visit | Attending: Obstetrics and Gynecology | Admitting: Obstetrics and Gynecology

## 2014-01-15 ENCOUNTER — Ambulatory Visit: Payer: BC Managed Care – PPO | Admitting: Family Medicine

## 2014-01-15 DIAGNOSIS — Z1231 Encounter for screening mammogram for malignant neoplasm of breast: Secondary | ICD-10-CM | POA: Diagnosis present

## 2014-01-24 ENCOUNTER — Other Ambulatory Visit: Payer: Self-pay | Admitting: Internal Medicine

## 2014-01-26 ENCOUNTER — Other Ambulatory Visit: Payer: Self-pay | Admitting: Internal Medicine

## 2014-01-27 ENCOUNTER — Encounter: Payer: Self-pay | Admitting: Internal Medicine

## 2014-01-27 MED ORDER — DICLOFENAC SODIUM 75 MG PO TBEC: 75.0000 mg | DELAYED_RELEASE_TABLET | Freq: Two times a day (BID) | ORAL | Status: DC | PRN

## 2014-01-27 MED ORDER — CLOTRIMAZOLE-BETAMETHASONE 1-0.05 % EX CREA: TOPICAL_CREAM | CUTANEOUS | Status: DC

## 2014-04-15 ENCOUNTER — Other Ambulatory Visit: Payer: Self-pay | Admitting: Internal Medicine

## 2014-04-15 NOTE — Telephone Encounter (Signed)
Done hardcopy to Cherina  

## 2014-04-16 NOTE — Telephone Encounter (Signed)
Done

## 2014-04-21 ENCOUNTER — Other Ambulatory Visit: Payer: Self-pay | Admitting: Internal Medicine

## 2014-04-21 NOTE — Telephone Encounter (Signed)
cherina to inform pt - rx already done mar 23 for soma refill

## 2014-04-22 NOTE — Telephone Encounter (Signed)
Patient has requested medication too soon due to lack of communication from pharmacy. Her regular pharmacy was bought out by [facility] and she has had a hard time getting her medications ever since. I called the [facility] question and they confirmed that RX had filled and patient just needs to pick up RX.

## 2014-06-02 ENCOUNTER — Other Ambulatory Visit: Payer: Self-pay | Admitting: Internal Medicine

## 2014-07-03 ENCOUNTER — Other Ambulatory Visit: Payer: Self-pay

## 2014-07-03 MED ORDER — CLOTRIMAZOLE-BETAMETHASONE 1-0.05 % EX CREA: TOPICAL_CREAM | CUTANEOUS | Status: DC

## 2014-07-09 ENCOUNTER — Other Ambulatory Visit: Payer: Self-pay | Admitting: Internal Medicine

## 2014-07-12 ENCOUNTER — Other Ambulatory Visit: Payer: Self-pay | Admitting: Internal Medicine

## 2014-07-14 MED ORDER — CARISOPRODOL 350 MG PO TABS: ORAL_TABLET | ORAL | Status: DC

## 2014-07-14 NOTE — Telephone Encounter (Signed)
Done hardcopy to Dahlia  

## 2014-07-14 NOTE — Telephone Encounter (Signed)
Rx faxed  To pharmacy

## 2014-07-31 ENCOUNTER — Ambulatory Visit (INDEPENDENT_AMBULATORY_CARE_PROVIDER_SITE_OTHER): Payer: BLUE CROSS/BLUE SHIELD | Admitting: Internal Medicine

## 2014-07-31 ENCOUNTER — Encounter: Payer: Self-pay | Admitting: Internal Medicine

## 2014-07-31 VITALS — BP 136/90 | HR 85 | Temp 97.8°F | Ht 67.0 in | Wt 389.0 lb

## 2014-07-31 DIAGNOSIS — H65112 Acute and subacute allergic otitis media (mucoid) (sanguinous) (serous), left ear: Secondary | ICD-10-CM | POA: Diagnosis not present

## 2014-07-31 DIAGNOSIS — I10 Essential (primary) hypertension: Secondary | ICD-10-CM

## 2014-07-31 DIAGNOSIS — R42 Dizziness and giddiness: Secondary | ICD-10-CM

## 2014-07-31 MED ORDER — MECLIZINE HCL 12.5 MG PO TABS: 12.5000 mg | ORAL_TABLET | Freq: Three times a day (TID) | ORAL | Status: DC | PRN

## 2014-07-31 MED ORDER — LEVOFLOXACIN 500 MG PO TABS: 500.0000 mg | ORAL_TABLET | Freq: Every day | ORAL | Status: DC

## 2014-07-31 MED ORDER — CARISOPRODOL 350 MG PO TABS: ORAL_TABLET | ORAL | Status: DC

## 2014-07-31 MED ORDER — POTASSIUM CHLORIDE CRYS ER 10 MEQ PO TBCR: 10.0000 meq | EXTENDED_RELEASE_TABLET | Freq: Every day | ORAL | Status: DC

## 2014-07-31 MED ORDER — DICLOFENAC SODIUM 75 MG PO TBEC: 75.0000 mg | DELAYED_RELEASE_TABLET | Freq: Two times a day (BID) | ORAL | Status: DC | PRN

## 2014-07-31 NOTE — Assessment & Plan Note (Signed)
[place] for re-start meclizine prn,  to f/u any worsening symptoms or concerns

## 2014-07-31 NOTE — Assessment & Plan Note (Signed)
stable overall by history and exam, recent data reviewed with pt, and pt to continue medical treatment as before,  to f/u any worsening symptoms or concerns BP Readings from Last 3 Encounters:  07/31/14 136/90  12/17/13 140/88  08/08/13 130/86

## 2014-07-31 NOTE — Progress Notes (Signed)
Subjective:    Patient ID: Jodi Keller, female    DOB: 07-19-1955, 59 y.o.   MRN: [id number]  HPI     Here with 2-3 days acute onset fever, left ear pain, pressure, headache, general weakness and malaise, but pt denies chest pain, wheezing, increased sob or doe, orthopnea, PND, increased LE swelling, palpitations, dizziness or syncope, except for onset vertigo x 2 days as well , room spinning with head movement. Pt denies new neurological symptoms such as new headache, or facial or extremity weakness or numbness   Pt denies polydipsia, polyuria Past Medical History  Diagnosis Date  . Hypertension   . Anemia   . GERD (gastroesophageal reflux disease)   . Morbid obesity   . Uterine prolapse   . Bilateral carpal tunnel syndrome 12/16/2010  . 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 Hyperlipidemia 02/25/2011   Past Surgical History  Procedure Laterality Date  . Cesarean section    . Foot surgery      reports that she has never smoked. She does not have any smokeless tobacco history on file. She reports that she drinks alcohol. She reports that she does not use illicit drugs. family history includes Cancer in her father, sister, and sister; Diabetes in her maternal grandmother; Thyroid disease in her other. No Known Allergies Current Outpatient Prescriptions on File Prior to Visit  Medication Sig Dispense Refill  . benazepril (LOTENSIN) 20 MG tablet Take one tablet by mouth one time daily 90 tablet 1  . Cholecalciferol (VITAMIN D PO) Take 1 tablet by mouth daily as needed. Often forgets to take and doesn't know strength    . clotrimazole-betamethasone (LOTRISONE) cream Use as directed twice a day as needed 15 g 4  . hydrochlorothiazide (MICROZIDE) 12.5 MG capsule TAKE ONE CAPSULE BY MOUTH ONE TIME DAILY 30 capsule 0  . omeprazole (PRILOSEC) 20 MG capsule Take one capsule by mouth one time daily 30 capsule 5  . oxyCODONE (ROXICODONE) 5 MG immediate release tablet Take 1  tablet (5 mg total) by mouth every 6 (six) hours as needed for severe pain. (Patient not taking: Reported on 07/31/2014) 60 tablet 0  . predniSONE (DELTASONE) 10 MG tablet Take 1 tablet (10 mg total) by mouth daily. 3 tabs by mouth per day for 3 days, then 2 tabs per day for 3 days, then 1 tab per day for 3 days, then stop (Patient not taking: Reported on 07/31/2014) 18 tablet 0  . VITAMIN E PO Take 1 capsule by mouth daily as needed. Takes when she remembers and doesn't know strength.     No current facility-administered medications on file prior to visit.   Review of Systems  Constitutional: Negative for unusual diaphoresis or night sweats HENT: Negative for ringing in ear or discharge Eyes: Negative for double vision or worsening visual disturbance.  Respiratory: Negative for choking and stridor.   Gastrointestinal: Negative for vomiting or other signifcant bowel change Genitourinary: Negative for hematuria or change in urine volume.  Musculoskeletal: Negative for other MSK pain or swelling Skin: Negative for color change and worsening wound.  Neurological: Negative for tremors and numbness other than noted  Psychiatric/Behavioral: Negative for decreased concentration or agitation other than above       Objective:   Physical Exam BP 136/90 mmHg  Pulse 85  Temp(Src) 97.8 F (36.6 C) (Oral)  Ht 5\' 7"  (1.702 m)  Wt 389 lb (176.449 kg)  BMI 60.91 kg/m2  SpO2 97%  LMP 01/12/2011  VS noted,  Constitutional: Pt appears in no significant distress HENT: Head: NCAT.  Right Ear: External ear normal.  Left Ear: External ear normal.  Left0 tm's with severe erythema.  Max sinus areas non tender.  Pharynx with mild erythema, no exudate Eyes: . Pupils are equal, round, and reactive to light. Conjunctivae and EOM are normal Neck: Normal range of motion. Neck supple.  Cardiovascular: Normal rate and regular rhythm.   Pulmonary/Chest: Effort normal and breath sounds without rales or wheezing.    Neurological: Pt is alert. Not confused , motor grossly intact, gait intact, cn 2-12 intact Skin: Skin is warm. No rash, no LE edema Psychiatric: Pt behavior is normal. No agitation.     Assessment & Plan:

## 2014-07-31 NOTE — Assessment & Plan Note (Signed)
Mild to mod, for antibx course,  to f/u any worsening symptoms or concerns 

## 2014-07-31 NOTE — Patient Instructions (Signed)
Please take all new medication as prescribed - the antibiotic and meclizine  Please continue all other medications as before, and refills have been done if requested - the diclofenac and soma  Please have the pharmacy call with any other refills you may need.  Please continue your efforts at being more active, low cholesterol diet, and weight control.  Please keep your appointments with your specialists as you may have planned

## 2014-07-31 NOTE — Progress Notes (Signed)
Pre visit review using our clinic review tool, if applicable. No additional management support is needed unless otherwise documented below in the visit note. 

## 2014-08-08 ENCOUNTER — Other Ambulatory Visit: Payer: Self-pay | Admitting: Internal Medicine

## 2014-08-10 ENCOUNTER — Other Ambulatory Visit: Payer: Self-pay

## 2014-08-10 MED ORDER — HYDROCHLOROTHIAZIDE 12.5 MG PO CAPS: 12.5000 mg | ORAL_CAPSULE | Freq: Every day | ORAL | Status: DC

## 2014-09-16 ENCOUNTER — Encounter: Payer: Self-pay | Admitting: Internal Medicine

## 2014-09-16 MED ORDER — OMEPRAZOLE 20 MG PO CPDR: 20.0000 mg | DELAYED_RELEASE_CAPSULE | Freq: Every day | ORAL | Status: DC

## 2014-11-17 ENCOUNTER — Encounter: Payer: Self-pay | Admitting: Internal Medicine

## 2014-11-17 ENCOUNTER — Other Ambulatory Visit (INDEPENDENT_AMBULATORY_CARE_PROVIDER_SITE_OTHER): Payer: BLUE CROSS/BLUE SHIELD

## 2014-11-17 ENCOUNTER — Ambulatory Visit (INDEPENDENT_AMBULATORY_CARE_PROVIDER_SITE_OTHER): Payer: BLUE CROSS/BLUE SHIELD | Admitting: Internal Medicine

## 2014-11-17 VITALS — BP 160/100 | HR 69 | Temp 97.8°F | Ht 67.0 in | Wt 383.1 lb

## 2014-11-17 DIAGNOSIS — Z0189 Encounter for other specified special examinations: Secondary | ICD-10-CM

## 2014-11-17 DIAGNOSIS — L304 Erythema intertrigo: Secondary | ICD-10-CM | POA: Insufficient documentation

## 2014-11-17 DIAGNOSIS — Z23 Encounter for immunization: Secondary | ICD-10-CM

## 2014-11-17 DIAGNOSIS — R269 Unspecified abnormalities of gait and mobility: Secondary | ICD-10-CM | POA: Diagnosis not present

## 2014-11-17 DIAGNOSIS — R7302 Impaired glucose tolerance (oral): Secondary | ICD-10-CM

## 2014-11-17 DIAGNOSIS — I10 Essential (primary) hypertension: Secondary | ICD-10-CM | POA: Diagnosis not present

## 2014-11-17 DIAGNOSIS — F411 Generalized anxiety disorder: Secondary | ICD-10-CM

## 2014-11-17 DIAGNOSIS — Z0000 Encounter for general adult medical examination without abnormal findings: Secondary | ICD-10-CM

## 2014-11-17 HISTORY — DX: Generalized anxiety disorder: F41.1

## 2014-11-17 LAB — URINALYSIS, ROUTINE W REFLEX MICROSCOPIC
Bilirubin Urine: NEGATIVE
Hgb urine dipstick: NEGATIVE
KETONES UR: NEGATIVE
NITRITE: NEGATIVE
SPECIFIC GRAVITY, URINE: 1.025 (ref 1.000–1.030)
Total Protein, Urine: NEGATIVE
URINE GLUCOSE: NEGATIVE
UROBILINOGEN UA: 0.2 (ref 0.0–1.0)
pH: 6 (ref 5.0–8.0)

## 2014-11-17 LAB — BASIC METABOLIC PANEL
BUN: 18 mg/dL (ref 6–23)
CHLORIDE: 103 meq/L (ref 96–112)
CO2: 29 meq/L (ref 19–32)
CREATININE: 0.83 mg/dL (ref 0.40–1.20)
Calcium: 9.2 mg/dL (ref 8.4–10.5)
GFR: 74.69 mL/min (ref >60.00)
Glucose, Bld: 95 mg/dL (ref 70–99)
Potassium: 3.7 mEq/L (ref 3.5–5.1)
Sodium: 140 mEq/L (ref 135–145)

## 2014-11-17 LAB — LIPID PANEL
CHOL/HDL RATIO: 4
Cholesterol: 196 mg/dL (ref 0–200)
HDL: 48.3 mg/dL (ref >39.00)
LDL Cholesterol: 116 mg/dL — ABNORMAL HIGH (ref 0–99)
NonHDL: 147.65
TRIGLYCERIDES: 159 mg/dL — AB (ref 0.0–149.0)
VLDL: 31.8 mg/dL (ref 0.0–40.0)

## 2014-11-17 LAB — HEPATIC FUNCTION PANEL
ALBUMIN: 3.8 g/dL (ref 3.5–5.2)
ALT: 22 U/L (ref 0–35)
AST: 20 U/L (ref 0–37)
Alkaline Phosphatase: 82 U/L (ref 39–117)
Bilirubin, Direct: 0.2 mg/dL (ref 0.0–0.3)
TOTAL PROTEIN: 7.4 g/dL (ref 6.0–8.3)
Total Bilirubin: 0.6 mg/dL (ref 0.2–1.2)

## 2014-11-17 LAB — CBC WITH DIFFERENTIAL/PLATELET
BASOS ABS: 0 10*3/uL (ref 0.0–0.1)
Basophils Relative: 0.3 % (ref 0.0–3.0)
EOS ABS: 0.2 10*3/uL (ref 0.0–0.7)
Eosinophils Relative: 1.9 % (ref 0.0–5.0)
HEMATOCRIT: 40.2 % (ref 36.0–46.0)
HEMOGLOBIN: 13.4 g/dL (ref 12.0–15.0)
LYMPHS PCT: 19.8 % (ref 12.0–46.0)
Lymphs Abs: 1.7 10*3/uL (ref 0.7–4.0)
MCHC: 33.3 g/dL (ref 30.0–36.0)
MCV: 93 fl (ref 78.0–100.0)
Monocytes Absolute: 0.6 10*3/uL (ref 0.1–1.0)
Monocytes Relative: 7.1 % (ref 3.0–12.0)
Neutro Abs: 6.2 10*3/uL (ref 1.4–7.7)
Neutrophils Relative %: 70.9 % (ref 43.0–77.0)
PLATELETS: 297 10*3/uL (ref 150.0–400.0)
RBC: 4.32 Mil/uL (ref 3.87–5.11)
RDW: 13.6 % (ref 11.5–15.5)
WBC: 8.7 10*3/uL (ref 4.0–10.5)

## 2014-11-17 LAB — TSH: TSH: 2.13 u[IU]/mL (ref 0.35–4.50)

## 2014-11-17 LAB — HEPATITIS C ANTIBODY: HCV AB: NEGATIVE

## 2014-11-17 LAB — HEMOGLOBIN A1C: HEMOGLOBIN A1C: 5.4 % (ref 4.6–6.5)

## 2014-11-17 MED ORDER — ESCITALOPRAM OXALATE 10 MG PO TABS: 10.0000 mg | ORAL_TABLET | Freq: Every day | ORAL | Status: DC

## 2014-11-17 MED ORDER — ALPRAZOLAM 0.25 MG PO TABS: 0.2500 mg | ORAL_TABLET | Freq: Every day | ORAL | Status: DC | PRN

## 2014-11-17 MED ORDER — HYDROCHLOROTHIAZIDE 12.5 MG PO CAPS: 12.5000 mg | ORAL_CAPSULE | Freq: Every day | ORAL | Status: DC

## 2014-11-17 MED ORDER — NYSTATIN 100000 UNIT/GM EX POWD: CUTANEOUS | Status: DC

## 2014-11-17 MED ORDER — BENAZEPRIL HCL 20 MG PO TABS: 20.0000 mg | ORAL_TABLET | Freq: Every day | ORAL | Status: DC

## 2014-11-17 NOTE — Assessment & Plan Note (Signed)
Elevated today due to stress per pt, < 104/.90 per pt at home, o/w stable overall by history and exam, recent data reviewed with pt, and pt to continue medical treatment as before,  to f/u any worsening symptoms or concerns BP Readings from Last 3 Encounters:  11/17/14 160/100  07/31/14 136/90  12/17/13 140/88

## 2014-11-17 NOTE — Assessment & Plan Note (Signed)
For PT eval as above, consider neuro eval, head MRI, exam benign today

## 2014-11-17 NOTE — Progress Notes (Signed)
Pre visit review using our clinic review tool, if applicable. No additional management support is needed unless otherwise documented below in the visit note. 

## 2014-11-17 NOTE — Assessment & Plan Note (Signed)
Mild below right breast, for nystatin powder asd,  to f/u any worsening symptoms or concerns

## 2014-11-17 NOTE — Addendum Note (Signed)
Addended by: Lowella Dandy on: 11/17/2014 05:10 PM   Modules accepted: Orders

## 2014-11-17 NOTE — Assessment & Plan Note (Signed)
Worsening recently, manifested with fear of walking now without something to hold onto, exam benign, for lexapro 10 qd, xanax prn panic, refer PT for evalution, consider neurology

## 2014-11-17 NOTE — Assessment & Plan Note (Signed)
Asymtp, for contd efforts at wt loss and diet, , for f/u a1c with next labs

## 2014-11-17 NOTE — Patient Instructions (Addendum)
You had the flu shot today  Please take all new medication as prescribed - the lexapro daily, xanax as needed only, and nystatin powder for the rash  You will be contacted regarding the referral for: counseling, and physical therapy  Please continue all other medications as before, and refills have been done if requested.  Please have the pharmacy call with any other refills you may need.  Please keep your appointments with your specialists as you may have planned  We'll see you at your next appt in 2 weeks as planned, with Lab testing done 3-5 days before

## 2014-11-17 NOTE — Progress Notes (Signed)
Subjective:    Patient ID: Jodi Keller, female    DOB: 1955-07-28, 59 y.o.   MRN: [id number]  HPI  Here with feeling of "being scared to walk by myself" and worries about falling, less in more familiar settings, worse in unfamilar, several times per day, not really dizzy, and Pt denies new neurological symptoms such as new headache, or facial or extremity weakness or numbness.  Better to walk with something around to lean on or hold, such as grocery with the cart.  O/w fears falling. No actual falls.  Did have some wellbutrin after sister died 21 yrs ago for a short term. Denies worsening depressive symptoms, suicidal ideation, or panic; has ongoing anxiety.  Has not had counseling for this. Pt denies chest pain, increased sob or doe, wheezing, orthopnea, PND, increased LE swelling, palpitations, dizziness or syncope.   Past Medical History  Diagnosis Date  . Hypertension   . Anemia   . GERD (gastroesophageal reflux disease)   . Morbid obesity ([place])   . Uterine prolapse   . Bilateral carpal tunnel syndrome 12/16/2010  . 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 Hyperlipidemia 02/25/2011   Past Surgical History  Procedure Laterality Date  . Cesarean section    . Foot surgery      reports that she has never smoked. She does not have any smokeless tobacco history on file. She reports that she drinks alcohol. She reports that she does not use illicit drugs. family history includes Cancer in her father, sister, and sister; Diabetes in her maternal grandmother; Thyroid disease in her other. No Known Allergies Current Outpatient Prescriptions on File Prior to Visit  Medication Sig Dispense Refill  . carisoprodol (SOMA) 350 MG tablet TAKE ONE TABLET BY MOUTH TWICE DAILY AS NEEDED FOR MUSCLE SPASMS 60 tablet 5  . Cholecalciferol (VITAMIN D PO) Take 1 tablet by mouth daily as needed. Often forgets to take and doesn't know strength    . clotrimazole-betamethasone (LOTRISONE) cream  Use as directed twice a day as needed 15 g 4  . diclofenac (VOLTAREN) 75 MG EC tablet Take 1 tablet (75 mg total) by mouth 2 (two) times daily as needed. 60 tablet 5  . omeprazole (PRILOSEC) 20 MG capsule Take 1 capsule (20 mg total) by mouth daily. 30 capsule 5  . potassium chloride (K-DUR,KLOR-CON) 10 MEQ tablet Take 1 tablet (10 mEq total) by mouth daily. 90 tablet 1  . VITAMIN E PO Take 1 capsule by mouth daily as needed. Takes when she remembers and doesn't know strength.     No current facility-administered medications on file prior to visit.    Review of Systems  Constitutional: Negative for unusual diaphoresis or night sweats HENT: Negative for ringing in ear or discharge Eyes: Negative for double vision or worsening visual disturbance.  Respiratory: Negative for choking and stridor.   Gastrointestinal: Negative for vomiting or other signifcant bowel change Genitourinary: Negative for hematuria or change in urine volume.  Musculoskeletal: Negative for other MSK pain or swelling Skin: Negative for color change and worsening wound.  Neurological: Negative for tremors and numbness other than noted  Psychiatric/Behavioral: Negative for decreased concentration or agitation other than above       Objective:   Physical Exam BP 160/100 mmHg  Pulse 69  Temp(Src) 97.8 F (36.6 C) (Oral)  Ht 5\' 7"  (1.702 m)  Wt 383 lb 2 oz (173.784 kg)  BMI 59.99 kg/m2  SpO2 97%  LMP 01/12/2011 VS noted,  Constitutional: Pt appears in no significant distress HENT: Head: NCAT.  Right Ear: External ear normal.  Left Ear: External ear normal.  Eyes: . Pupils are equal, round, and reactive to light. Conjunctivae and EOM are normal Neck: Normal range of motion. Neck supple.  Cardiovascular: Normal rate and regular rhythm.   Pulmonary/Chest: Effort normal and breath sounds without rales or wheezing.  Abd:  Soft, NT, ND, + BS Neurological: Pt is alert. Not confused , motor grossly intact Skin: Skin  is warm. No rash, no LE edema Psychiatric: Pt behavior is normal. No agitation. 2+ nervous    Assessment & Plan:

## 2014-11-24 ENCOUNTER — Encounter: Payer: Self-pay | Admitting: Internal Medicine

## 2014-12-02 ENCOUNTER — Ambulatory Visit (INDEPENDENT_AMBULATORY_CARE_PROVIDER_SITE_OTHER): Payer: BLUE CROSS/BLUE SHIELD | Admitting: Internal Medicine

## 2014-12-02 ENCOUNTER — Encounter: Payer: Self-pay | Admitting: Internal Medicine

## 2014-12-02 VITALS — BP 146/94 | HR 104 | Temp 97.7°F | Ht 67.0 in | Wt 379.0 lb

## 2014-12-02 DIAGNOSIS — Z0000 Encounter for general adult medical examination without abnormal findings: Secondary | ICD-10-CM | POA: Diagnosis not present

## 2014-12-02 DIAGNOSIS — Z23 Encounter for immunization: Secondary | ICD-10-CM

## 2014-12-02 DIAGNOSIS — I10 Essential (primary) hypertension: Secondary | ICD-10-CM

## 2014-12-02 DIAGNOSIS — F411 Generalized anxiety disorder: Secondary | ICD-10-CM

## 2014-12-02 MED ORDER — DULOXETINE HCL 30 MG PO CPEP: 30.0000 mg | ORAL_CAPSULE | Freq: Every day | ORAL | Status: DC

## 2014-12-02 MED ORDER — DULOXETINE HCL 60 MG PO CPEP
60.0000 mg | ORAL_CAPSULE | Freq: Every day | ORAL | Status: DC
Start: 2014-12-02 — End: 2015-02-09

## 2014-12-02 NOTE — Patient Instructions (Addendum)
You had the Tdap Tetanus shot today  OK to stop the lexapro  Please take all new medication as prescribed - the cymbalta  Please continue all other medications as before, and refills have been done if requested.  Please have the pharmacy call with any other refills you may need.  Please continue your efforts at being more active, low cholesterol diet, and weight control.  You are otherwise up to date with prevention measures today.  Please keep your appointments with your specialists as you may have planned  Please call if you would want a Neurology referral for the balance issue  Please return in 1 year for your yearly visit, or sooner if needed, with Lab testing done 3-5 days before

## 2014-12-02 NOTE — Assessment & Plan Note (Signed)

## 2014-12-02 NOTE — Assessment & Plan Note (Signed)
Ok to change to cymbalta asd, f/u with counseling as referred

## 2014-12-02 NOTE — Assessment & Plan Note (Signed)
stable overall by history and exam, recent data reviewed with pt, and pt to continue medical treatment as before,  to f/u any worsening symptoms or concerns BP Readings from Last 3 Encounters:  12/02/14 146/94  11/17/14 160/100  07/31/14 136/90

## 2014-12-02 NOTE — Progress Notes (Signed)
Pre visit review using our clinic review tool, if applicable. No additional management support is needed unless otherwise documented below in the visit note. 

## 2014-12-02 NOTE — Progress Notes (Signed)
Subjective:    Patient ID: Jodi Keller, female    DOB: 1955/12/23, 59 y.o.   MRN: [id number]  HPI  Here for wellness and f/u;  Overall doing ok;  Pt denies Chest pain, worsening SOB, DOE, wheezing, orthopnea, PND, worsening LE edema, palpitations, dizziness or syncope.  Pt denies neurological change such as new headache, facial or extremity weakness.  Pt denies polydipsia, polyuria, or low sugar symptoms. Pt states overall good compliance with treatment and medications, good tolerability, and has been trying to follow appropriate diet.  Pt denies worsening depressive symptoms, suicidal ideation or panic. No fever, night sweats, wt loss, loss of appetite, or other constitutional symptoms.  Pt states good ability with ADL's, has low fall risk, home safety reviewed and adequate, no other significant changes in hearing or vision, and only occasionally active with exercise. PT starts tomorrow,  Not yet called about the counseling  Referral.  Does have some left ear aching since last night.  Past Medical History  Diagnosis Date  . Hypertension   . Anemia   . GERD (gastroesophageal reflux disease)   . Morbid obesity ([place])   . Uterine prolapse   . Bilateral carpal tunnel syndrome 12/16/2010  . 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 Hyperlipidemia 02/25/2011  . Generalized anxiety disorder 11/17/2014   Past Surgical History  Procedure Laterality Date  . Cesarean section    . Foot surgery      reports that she has never smoked. She does not have any smokeless tobacco history on file. She reports that she drinks alcohol. She reports that she does not use illicit drugs. family history includes Cancer in her father, sister, and sister; Diabetes in her maternal grandmother; Thyroid disease in her other. No Known Allergies Current Outpatient Prescriptions on File Prior to Visit  Medication Sig Dispense Refill  . ALPRAZolam (XANAX) 0.25 MG tablet Take 1 tablet (0.25 mg total) by mouth  daily as needed for anxiety. 30 tablet 2  . benazepril (LOTENSIN) 20 MG tablet Take 1 tablet (20 mg total) by mouth daily. 90 tablet 3  . carisoprodol (SOMA) 350 MG tablet TAKE ONE TABLET BY MOUTH TWICE DAILY AS NEEDED FOR MUSCLE SPASMS 60 tablet 5  . Cholecalciferol (VITAMIN D PO) Take 1 tablet by mouth daily as needed. Often forgets to take and doesn't know strength    . clotrimazole-betamethasone (LOTRISONE) cream Use as directed twice a day as needed 15 g 4  . diclofenac (VOLTAREN) 75 MG EC tablet Take 1 tablet (75 mg total) by mouth 2 (two) times daily as needed. 60 tablet 5  . escitalopram (LEXAPRO) 10 MG tablet Take 1 tablet (10 mg total) by mouth daily. 90 tablet 3  . hydrochlorothiazide (MICROZIDE) 12.5 MG capsule Take 1 capsule (12.5 mg total) by mouth daily. 90 capsule 3  . nystatin (MYCOSTATIN) powder Use as directed twice daily to affected area 30 g 2  . omeprazole (PRILOSEC) 20 MG capsule Take 1 capsule (20 mg total) by mouth daily. 30 capsule 5  . potassium chloride (K-DUR,KLOR-CON) 10 MEQ tablet Take 1 tablet (10 mEq total) by mouth daily. 90 tablet 1  . VITAMIN E PO Take 1 capsule by mouth daily as needed. Takes when she remembers and doesn't know strength.     No current facility-administered medications on file prior to visit.     Review of Systems Constitutional: Negative for increased diaphoresis, other activity, appetite or siginficant weight change other than noted HENT: Negative for  worsening hearing loss, ear pain, facial swelling, mouth sores and neck stiffness.   Eyes: Negative for other worsening pain, redness or visual disturbance.  Respiratory: Negative for shortness of breath and wheezing  Cardiovascular: Negative for chest pain and palpitations.  Gastrointestinal: Negative for diarrhea, blood in stool, abdominal distention or other pain Genitourinary: Negative for hematuria, flank pain or change in urine volume.  Musculoskeletal: Negative for myalgias or other  joint complaints.  Skin: Negative for color change and wound or drainage.  Neurological: Negative for syncope and numbness. other than noted Hematological: Negative for adenopathy. or other swelling Psychiatric/Behavioral: Negative for hallucinations, SI, self-injury, decreased concentration or other worsening agitation.      Objective:   Physical Exam BP 146/94 mmHg  Pulse 104  Temp(Src) 97.7 F (36.5 C) (Oral)  Ht 5\' 7"  (1.702 m)  Wt 379 lb (171.913 kg)  BMI 59.35 kg/m2  SpO2 97%  LMP 01/12/2011 VS noted,  Constitutional: Pt is oriented to person, place, and time. Appears well-developed and well-nourished, in no significant distress Head: Normocephalic and atraumatic.  Right Ear: External ear normal.  Left Ear: External ear normal.  Nose: Nose normal.  Mouth/Throat: Oropharynx is clear and moist.  Eyes: Conjunctivae and EOM are normal. Pupils are equal, round, and reactive to light.  Neck: Normal range of motion. Neck supple. No JVD present. No tracheal deviation present or significant neck LA or mass Cardiovascular: Normal rate, regular rhythm, normal heart sounds and intact distal pulses.   Pulmonary/Chest: Effort normal and breath sounds without rales or wheezing  Abdominal: Soft. Bowel sounds are normal. NT. No HSM  Musculoskeletal: Normal range of motion. Exhibits no edema.  Lymphadenopathy:  Has no cervical adenopathy.  Neurological: Pt is alert and oriented to person, place, and time. Pt has normal reflexes. No cranial nerve deficit. Motor grossly intact Skin: Skin is warm and dry. No rash noted.  Psychiatric:  Has normal mood and affect. Behavior is normal.      Assessment & Plan:

## 2014-12-16 ENCOUNTER — Ambulatory Visit: Payer: BLUE CROSS/BLUE SHIELD | Admitting: Licensed Clinical Social Worker

## 2015-01-24 ENCOUNTER — Other Ambulatory Visit: Payer: Self-pay | Admitting: Internal Medicine

## 2015-02-09 ENCOUNTER — Telehealth: Payer: Self-pay

## 2015-02-09 ENCOUNTER — Encounter: Payer: Self-pay | Admitting: Internal Medicine

## 2015-02-09 MED ORDER — VITAMIN D 1000 UNITS PO TABS: 1000.0000 [IU] | ORAL_TABLET | Freq: Every day | ORAL | Status: AC | PRN

## 2015-02-09 MED ORDER — OMEPRAZOLE 20 MG PO CPDR: 20.0000 mg | DELAYED_RELEASE_CAPSULE | Freq: Every day | ORAL | Status: DC

## 2015-02-09 MED ORDER — NYSTATIN 100000 UNIT/GM EX POWD: CUTANEOUS | Status: DC

## 2015-02-09 MED ORDER — ALPRAZOLAM 0.25 MG PO TABS: 0.2500 mg | ORAL_TABLET | Freq: Every day | ORAL | Status: DC | PRN

## 2015-02-09 MED ORDER — VITAMIN E 15 UNIT/0.3ML PO SOLN: 100.0000 [IU] | Freq: Every day | ORAL | Status: DC | PRN

## 2015-02-09 MED ORDER — HYDROCHLOROTHIAZIDE 12.5 MG PO CAPS: 12.5000 mg | ORAL_CAPSULE | Freq: Every day | ORAL | Status: DC

## 2015-02-09 MED ORDER — DICLOFENAC SODIUM 75 MG PO TBEC: 75.0000 mg | DELAYED_RELEASE_TABLET | Freq: Two times a day (BID) | ORAL | Status: DC | PRN

## 2015-02-09 MED ORDER — DULOXETINE HCL 60 MG PO CPEP: 60.0000 mg | ORAL_CAPSULE | Freq: Every day | ORAL | Status: DC

## 2015-02-09 MED ORDER — POTASSIUM CHLORIDE CRYS ER 10 MEQ PO TBCR: 10.0000 meq | EXTENDED_RELEASE_TABLET | Freq: Every day | ORAL | Status: DC

## 2015-02-09 MED ORDER — CARISOPRODOL 350 MG PO TABS: ORAL_TABLET | ORAL | Status: DC

## 2015-02-09 MED ORDER — CLOTRIMAZOLE-BETAMETHASONE 1-0.05 % EX CREA: TOPICAL_CREAM | CUTANEOUS | Status: DC

## 2015-02-09 MED ORDER — BENAZEPRIL HCL 20 MG PO TABS: 20.0000 mg | ORAL_TABLET | Freq: Every day | ORAL | Status: DC

## 2015-02-09 NOTE — Telephone Encounter (Signed)
Medication printed signed and faxed for soma

## 2015-02-09 NOTE — Telephone Encounter (Signed)
Done hardcopy to [facility]  - the soma  OK to refill all meds to new pharmacy - to [facility]

## 2015-02-09 NOTE — Telephone Encounter (Signed)
A user error has taken place.

## 2015-02-10 ENCOUNTER — Encounter: Payer: Self-pay | Admitting: Internal Medicine

## 2015-02-16 ENCOUNTER — Encounter: Payer: Self-pay | Admitting: Internal Medicine

## 2015-02-17 NOTE — Telephone Encounter (Signed)
The computer record indicates a rx was already done jan 17 for #60 with 5 refills  No repeat refill is needed by me at this time

## 2015-02-23 ENCOUNTER — Telehealth: Payer: Self-pay | Admitting: *Deleted

## 2015-02-23 NOTE — Telephone Encounter (Signed)
Left msg on triage stating having a hard time receiving rx for soma. Pt states office has fax several x's, but we still have not receive. Wanting to get a verbal to fill prescription. Called [facility] spoke with AJ gave md authorization from 1/17...Jodi Keller

## 2015-03-31 ENCOUNTER — Other Ambulatory Visit: Payer: Self-pay

## 2015-03-31 DIAGNOSIS — Z1231 Encounter for screening mammogram for malignant neoplasm of breast: Secondary | ICD-10-CM

## 2015-04-03 ENCOUNTER — Other Ambulatory Visit: Payer: Self-pay | Admitting: Internal Medicine

## 2015-05-10 ENCOUNTER — Ambulatory Visit
Admission: RE | Admit: 2015-05-10 | Discharge: 2015-05-10 | Disposition: A | Discharge status: Home / Self Care | Payer: PRIVATE HEALTH INSURANCE | Source: Ambulatory Visit

## 2015-05-10 DIAGNOSIS — Z1231 Encounter for screening mammogram for malignant neoplasm of breast: Secondary | ICD-10-CM

## 2015-05-29 ENCOUNTER — Other Ambulatory Visit: Payer: Self-pay | Admitting: Internal Medicine

## 2015-06-09 ENCOUNTER — Encounter: Payer: Self-pay | Admitting: Family

## 2015-06-09 ENCOUNTER — Ambulatory Visit (INDEPENDENT_AMBULATORY_CARE_PROVIDER_SITE_OTHER): Payer: PRIVATE HEALTH INSURANCE | Admitting: Family

## 2015-06-09 ENCOUNTER — Emergency Department (HOSPITAL_COMMUNITY)
Admission: EM | Admit: 2015-06-09 | Discharge: 2015-06-09 | Discharge status: Against Medical Advice | Payer: PRIVATE HEALTH INSURANCE | Attending: Emergency Medicine | Admitting: Emergency Medicine

## 2015-06-09 ENCOUNTER — Emergency Department (HOSPITAL_COMMUNITY): Payer: PRIVATE HEALTH INSURANCE

## 2015-06-09 ENCOUNTER — Encounter (HOSPITAL_COMMUNITY): Payer: Self-pay | Admitting: Emergency Medicine

## 2015-06-09 VITALS — BP 130/80 | HR 97 | Temp 97.8°F | Ht 67.0 in | Wt 373.0 lb

## 2015-06-09 DIAGNOSIS — I10 Essential (primary) hypertension: Secondary | ICD-10-CM | POA: Diagnosis not present

## 2015-06-09 DIAGNOSIS — Z7982 Long term (current) use of aspirin: Secondary | ICD-10-CM | POA: Diagnosis not present

## 2015-06-09 DIAGNOSIS — R42 Dizziness and giddiness: Secondary | ICD-10-CM

## 2015-06-09 DIAGNOSIS — E785 Hyperlipidemia, unspecified: Secondary | ICD-10-CM | POA: Insufficient documentation

## 2015-06-09 DIAGNOSIS — R072 Precordial pain: Secondary | ICD-10-CM | POA: Diagnosis not present

## 2015-06-09 DIAGNOSIS — R079 Chest pain, unspecified: Secondary | ICD-10-CM | POA: Diagnosis not present

## 2015-06-09 LAB — BASIC METABOLIC PANEL
Anion gap: 8 (ref 5–15)
BUN: 19 mg/dL (ref 6–20)
CALCIUM: 9.3 mg/dL (ref 8.9–10.3)
CO2: 27 mmol/L (ref 22–32)
Chloride: 106 mmol/L (ref 101–111)
Creatinine, Ser: 0.74 mg/dL (ref 0.44–1.00)
GFR calc Af Amer: >60 mL/min (ref >60)
GLUCOSE: 108 mg/dL — AB (ref 65–99)
POTASSIUM: 4.1 mmol/L (ref 3.5–5.1)
Sodium: 141 mmol/L (ref 135–145)

## 2015-06-09 LAB — CBC
HEMATOCRIT: 45 % (ref 36.0–46.0)
HEMOGLOBIN: 14.6 g/dL (ref 12.0–15.0)
MCH: 30.2 pg (ref 26.0–34.0)
MCHC: 32.4 g/dL (ref 30.0–36.0)
MCV: 93 fL (ref 78.0–100.0)
Platelets: 277 10*3/uL (ref 150–400)
RBC: 4.84 MIL/uL (ref 3.87–5.11)
RDW: 14.9 % (ref 11.5–15.5)
WBC: 10.2 10*3/uL (ref 4.0–10.5)

## 2015-06-09 LAB — I-STAT TROPONIN, ED: Troponin i, poc: 0 ng/mL (ref 0.00–0.08)

## 2015-06-09 MED ORDER — ASPIRIN 81 MG PO CHEW
324.0000 mg | CHEWABLE_TABLET | Freq: Once | ORAL | Status: AC
  Administered 2015-06-09: 324 mg via ORAL
  Filled 2015-06-09: qty 4

## 2015-06-09 MED ORDER — ASPIRIN 81 MG PO CHEW: 81.0000 mg | CHEWABLE_TABLET | Freq: Every day | ORAL | Status: AC

## 2015-06-09 NOTE — ED Notes (Signed)
Patient transported to X-ray 

## 2015-06-09 NOTE — ED Notes (Addendum)
Patient was alert, oriented and stable upon discharge. RN went over AVS and patient had no further questions. Instructed to call 911 or come straight back if she had anymore problems, ie. Chest pain, SOB, etc.. Pt understood.

## 2015-06-09 NOTE — Discharge Instructions (Signed)
Nonspecific Chest Pain  °Chest pain can be caused by many different conditions. There is always a chance that your pain could be related to something serious, such as a heart attack or a blood clot in your lungs. Chest pain can also be caused by conditions that are not life-threatening. If you have chest pain, it is very important to follow up with your health care provider. °CAUSES  °Chest pain can be caused by: °· Heartburn. °· Pneumonia or bronchitis. °· Anxiety or stress. °· Inflammation around your heart (pericarditis) or lung (pleuritis or pleurisy). °· A blood clot in your lung. °· A collapsed lung (pneumothorax). It can develop suddenly on its own (spontaneous pneumothorax) or from trauma to the chest. °· Shingles infection (varicella-zoster virus). °· Heart attack. °· Damage to the bones, muscles, and cartilage that make up your chest wall. This can include: °¨ Bruised bones due to injury. °¨ Strained muscles or cartilage due to frequent or repeated coughing or overwork. °¨ Fracture to one or more ribs. °¨ Sore cartilage due to inflammation (costochondritis). °RISK FACTORS  °Risk factors for chest pain may include: °· Activities that increase your risk for trauma or injury to your chest. °· Respiratory infections or conditions that cause frequent coughing. °· Medical conditions or overeating that can cause heartburn. °· Heart disease or family history of heart disease. °· Conditions or health behaviors that increase your risk of developing a blood clot. °· Having had chicken pox (varicella zoster). °SIGNS AND SYMPTOMS °Chest pain can feel like: °· Burning or tingling on the surface of your chest or deep in your chest. °· Crushing, pressure, aching, or squeezing pain. °· Dull or sharp pain that is worse when you move, cough, or take a deep breath. °· Pain that is also felt in your back, neck, shoulder, or arm, or pain that spreads to any of these areas. °Your chest pain may come and go, or it may stay  constant. °DIAGNOSIS °Lab tests or other studies may be needed to find the cause of your pain. Your health care provider may have you take a test called an ambulatory ECG (electrocardiogram). An ECG records your heartbeat patterns at the time the test is performed. You may also have other tests, such as: °· Transthoracic echocardiogram (TTE). During echocardiography, sound waves are used to create a picture of all of the heart structures and to look at how blood flows through your heart. °· Transesophageal echocardiogram (TEE). This is a more advanced imaging test that obtains images from inside your body. It allows your health care provider to see your heart in finer detail. °· Cardiac monitoring. This allows your health care provider to monitor your heart rate and rhythm in real time. °· Holter monitor. This is a portable device that records your heartbeat and can help to diagnose abnormal heartbeats. It allows your health care provider to track your heart activity for several days, if needed. °· Stress tests. These can be done through exercise or by taking medicine that makes your heart beat more quickly. °· Blood tests. °· Imaging tests. °TREATMENT  °Your treatment depends on what is causing your chest pain. Treatment may include: °· Medicines. These may include: °¨ Acid blockers for heartburn. °¨ Anti-inflammatory medicine. °¨ Pain medicine for inflammatory conditions. °¨ Antibiotic medicine, if an infection is present. °¨ Medicines to dissolve blood clots. °¨ Medicines to treat coronary artery disease. °· Supportive care for conditions that do not require medicines. This may include: °¨ Resting. °¨ Applying heat   or cold packs to injured areas. °¨ Limiting activities until pain decreases. °HOME CARE INSTRUCTIONS °· If you were prescribed an antibiotic medicine, finish it all even if you start to feel better. °· Avoid any activities that bring on chest pain. °· Do not use any tobacco products, including  cigarettes, chewing tobacco, or electronic cigarettes. If you need help quitting, ask your health care provider. °· Do not drink alcohol. °· Take medicines only as directed by your health care provider. °· Keep all follow-up visits as directed by your health care provider. This is important. This includes any further testing if your chest pain does not go away. °· If heartburn is the cause for your chest pain, you may be told to keep your head raised (elevated) while sleeping. This reduces the chance that acid will go from your stomach into your esophagus. °· Make lifestyle changes as directed by your health care provider. These may include: °¨ Getting regular exercise. Ask your health care provider to suggest some activities that are safe for you. °¨ Eating a heart-healthy diet. A registered dietitian can help you to learn healthy eating options. °¨ Maintaining a healthy weight. °¨ Managing diabetes, if necessary. °¨ Reducing stress. °SEEK MEDICAL CARE IF: °· Your chest pain does not go away after treatment. °· You have a rash with blisters on your chest. °· You have a fever. °SEEK IMMEDIATE MEDICAL CARE IF:  °· Your chest pain is worse. °· You have an increasing cough, or you cough up blood. °· You have severe abdominal pain. °· You have severe weakness. °· You faint. °· You have chills. °· You have sudden, unexplained chest discomfort. °· You have sudden, unexplained discomfort in your arms, back, neck, or jaw. °· You have shortness of breath at any time. °· You suddenly start to sweat, or your skin gets clammy. °· You feel nauseous or you vomit. °· You suddenly feel light-headed or dizzy. °· Your heart begins to beat quickly, or it feels like it is skipping beats. °These symptoms may represent a serious problem that is an emergency. Do not wait to see if the symptoms will go away. Get medical help right away. Call your local emergency services (911 in the [place] Do not drive yourself to the hospital. °  °This  information is not intended to replace advice given to you by your health care provider. Make sure you discuss any questions you have with your health care provider. °  °Document Released: 10/19/2004 Document Revised: 01/30/2014 Document Reviewed: 08/15/2013 °Elsevier Interactive Patient Education ©2016 [employer] ° °

## 2015-06-09 NOTE — ED Notes (Signed)
Pt states she while walking yesterday @ 11am she became diaphoretic then experienced substernal cramping/pressure. Pt states she became very dizzy and weak. Pt had to go home and sleep for the remainder of the day. Pt states she has felt as if she has a weight on her since that time. Pt was seen by PCP today, advised to come to ED for work up

## 2015-06-09 NOTE — ED Provider Notes (Signed)
CSN: [id number]     Arrival date & time 06/09/15  1910 History   First MD Initiated Contact with Patient 06/09/15 2007     Chief Complaint  Patient presents with  . Chest Pain     (Consider location/radiation/quality/duration/timing/severity/associated sxs/prior Treatment) HPI Comments: 60 year old female with a history of hypertension, esophageal reflux, morbid obesity, and hyperlipidemia presents to the emergency department for evaluation of chest pain. She reports that she was walking yesterday at 11 AM when she began to feel a substernal pressure. This was nonradiating and was associated with generalized weakness, diaphoresis, and some reported dizziness. No significant SOB, per patient. No fevers or syncope. She denies taking any medications for her symptoms. She did go home from work and sleep for the remainder of the day. She states that she "felt like a door was on my chest" when she was lying down. She states that she has continued to feel his pressure intermittently since this time. She was sent to the ED for evaluation by her primary care doctor. She denies ever seeing a cardiologist or having a stress test. No family history of ACS. Patient denies a personal history of tobacco use, diabetes, and DVT/PE.  Patient is a 60 y.o. female presenting with chest pain. The history is provided by the patient. No language interpreter was used.  Chest Pain Associated symptoms: diaphoresis, dizziness and weakness (generalized)   Associated symptoms: no fever, no nausea, no shortness of breath and not vomiting     Past Medical History  Diagnosis Date  . Hypertension   . Anemia   . GERD (gastroesophageal reflux disease)   . Morbid obesity ([place])   . Uterine prolapse   . Bilateral carpal tunnel syndrome 12/16/2010  . 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 Hyperlipidemia 02/25/2011  . Generalized anxiety disorder 11/17/2014   Past Surgical History  Procedure Laterality Date  .  Cesarean section    . Foot surgery     Family History  Problem Relation Age of Onset  . Cancer Father     Prostate  . Cancer Sister     Multiple Myeloma  . Diabetes Maternal Grandmother   . Cancer Sister     Breast  . Thyroid disease Other     Multiple   Social History  Substance Use Topics  . Smoking status: Never Smoker   . Smokeless tobacco: None  . Alcohol Use: Yes     Comment: rare   OB History    Gravida Para Term Preterm AB TAB SAB Ectopic Multiple Living   0         0      Review of Systems  Constitutional: Positive for diaphoresis. Negative for fever.  Respiratory: Negative for shortness of breath.   Cardiovascular: Positive for chest pain.  Gastrointestinal: Negative for nausea and vomiting.  Neurological: Positive for dizziness and weakness (generalized). Negative for syncope.  All other systems reviewed and are negative.   Allergies  Review of patient's allergies indicates no known allergies.  Home Medications   Prior to Admission medications   Medication Sig Start Date End Date Taking? Authorizing Provider  benazepril (LOTENSIN) 20 MG tablet Take 1 tablet (20 mg total) by mouth daily. 02/09/15  Yes Biagio Borg, MD  Calcium-Magnesium-Zinc [phone] MG TABS Take 1 tablet by mouth daily.   Yes Historical Provider, MD  carisoprodol (SOMA) 350 MG tablet TAKE ONE TABLET BY MOUTH TWICE DAILY AS NEEDED FOR MUSCLE SPASMS 02/09/15  Yes Jeneen Rinks  Quin Hoop, MD  clotrimazole-betamethasone (LOTRISONE) cream Use as directed twice a day as needed Patient taking differently: Use as directed twice a day as needed for anti-fungal 02/09/15  Yes Biagio Borg, MD  diclofenac (VOLTAREN) 75 MG EC tablet TAKE 1 TABLET BY MOUTH TWICE DAILY AS NEEDED Patient taking differently: TAKE 1 TABLET BY MOUTH TWICE DAILY AS NEEDED PAIN 05/31/15  Yes Biagio Borg, MD  DULoxetine (CYMBALTA) 60 MG capsule Take 1 capsule (60 mg total) by mouth daily. 02/09/15  Yes Biagio Borg, MD  hydrochlorothiazide  (MICROZIDE) 12.5 MG capsule Take 1 capsule (12.5 mg total) by mouth daily. 02/09/15  Yes Biagio Borg, MD  Multiple Vitamins-Minerals (MULTIVITAMIN & MINERAL PO) Take 1 tablet by mouth daily.   Yes Historical Provider, MD  potassium chloride (K-DUR,KLOR-CON) 10 MEQ tablet Take 1 tablet (10 mEq total) by mouth daily. 02/09/15  Yes Biagio Borg, MD  ALPRAZolam Duanne Moron) 0.25 MG tablet Take 1 tablet (0.25 mg total) by mouth daily as needed for anxiety. Patient not taking: Reported on 06/09/2015 02/09/15   Biagio Borg, MD  aspirin 81 MG chewable tablet Chew 1 tablet (81 mg total) by mouth daily. 06/09/15   Antonietta Breach, PA-C  cholecalciferol (VITAMIN D) 1000 units tablet Take 1 tablet (1,000 Units total) by mouth daily as needed. Often forgets to take and doesn't know strength Patient not taking: Reported on 06/09/2015 02/09/15   Biagio Borg, MD  nystatin (MYCOSTATIN) powder Use as directed twice daily to affected area Patient not taking: Reported on 06/09/2015 02/09/15   Biagio Borg, MD  omeprazole (PRILOSEC) 20 MG capsule Take 1 capsule (20 mg total) by mouth daily. Patient not taking: Reported on 06/09/2015 02/09/15   Biagio Borg, MD  vitamin e (AQUASOL E) 15 UNIT/0.3ML SOLN solution Take 2 mLs (100 Units total) by mouth daily as needed. Takes when she remembers and doesn't know strength. Patient not taking: Reported on 06/09/2015 02/09/15   Biagio Borg, MD   BP 126/67 mmHg  Pulse 66  Temp(Src) 98.2 F (36.8 C) (Oral)  Resp 24  Ht _0  (1.702 m)  Wt 169.192 kg  BMI 58.41 kg/m2  SpO2 98%  LMP 01/12/2011   Physical Exam  Constitutional: She is oriented to person, place, and time. She appears well-developed and well-nourished. No distress.  Nontoxic appearing  HENT:  Head: Normocephalic and atraumatic.  Eyes: Conjunctivae and EOM are normal. No scleral icterus.  Neck: Normal range of motion.  No JVD  Cardiovascular: Normal rate, regular rhythm and intact distal pulses.   Pulmonary/Chest: Effort  normal and breath sounds normal. No respiratory distress. She has no wheezes. She has no rales.  Respirations even and unlabored  Musculoskeletal: Normal range of motion.  No significant peripheral pitting edema.  Neurological: She is alert and oriented to person, place, and time. She exhibits normal muscle tone. Coordination normal.  Skin: Skin is warm and dry. No rash noted. She is not diaphoretic. No erythema. No pallor.  Psychiatric: She has a normal mood and affect. Her behavior is normal.  Nursing note and vitals reviewed.   ED Course  Procedures (including critical care time) Labs Review Labs Reviewed  BASIC METABOLIC PANEL - Abnormal; Notable for the following:    Glucose, Bld 108 (*)    All other components within normal limits  CBC  I-STAT TROPOININ, ED    Imaging Review Dg Chest 2 View  06/09/2015  CLINICAL DATA:  60 year old female with  chest pain EXAM: CHEST  2 VIEW COMPARISON:  Chest radiograph dated 09/08/2011 FINDINGS: The heart size and mediastinal contours are within normal limits. Both lungs are clear. The visualized skeletal structures are unremarkable. IMPRESSION: No active cardiopulmonary disease. Electronically Signed   By: Anner Crete M.D.   On: 06/09/2015 20:54     I have personally reviewed and evaluated these images and lab results as part of my medical decision-making.   EKG Interpretation   Date/Time:  Wednesday Jun 09 2015 19:20:26 EDT Ventricular Rate:  73 PR Interval:  134 QRS Duration: 96 QT Interval:  379 QTC Calculation: 418 R Axis:   -34 Text Interpretation:  Sinus rhythm Probable left atrial enlargement Left  axis deviation Abnormal R-wave progression, early transition Baseline  wander in lead(s) I Confirmed by Alvino Chapel  MD, Ovid Curd [phone]) on  06/09/2015 9:37:50 PM      MDM   Final diagnoses:  Chest pain, unspecified chest pain type    60 year old female presents to the emergency department for evaluation of chest pain.  Symptoms began yesterday while walking, though patient states that her symptoms have not specifically been exertional since onset. She has continued to have intermittent central chest pressure. She had some diaphoresis, generalized weakness, and dizziness which have since resolved. Her cardiac workup today is reassuring with a negative troponin and nonischemic EKG. Chest x-ray is unremarkable. She does, however, have a heart score of 5 consistent with moderate risk of acute coronary event.  I have recommended that the patient be admitted to the hospital for further workup and, likely, stress test in the morning for further evaluation of her chest pain. I explained to the patient that, even though her workup today is very good, it does not exclude significant underlying coronary artery disease which could increase her risk for a heart attack in the near future. Patient is aware that lack of close follow-up and further testing may lead to serious outcomes included, but not limited to heart attack or death. Patient verbalizes understanding and states that she would like to continue with management on an outpatient basis. She declines admission today. Patient has the capacity to make this decision. She verbalizes understanding of her risks with discharge.  Patient referred to cardiology for outpatient follow-up. Return precautions discussed and provided. Patient discharged [place] in satisfactory condition, with no unaddressed concerns.   Filed Vitals:   06/09/15 1926 06/09/15 2028 06/09/15 2041 06/09/15 2200  BP: 152/92 155/81 114/91 126/67  Pulse: 74 62 78 66  Temp: 98.2 F (36.8 C)     TempSrc: Oral     Resp: _0 Height: _1  (1.702 m)     Weight: 169.192 kg     SpO2: 95% 99% 98% 98%     Antonietta Breach, PA-C 06/10/15 0030  Davonna Belling, MD 06/10/15 [phone]

## 2015-06-09 NOTE — ED Notes (Signed)
PA at bedside.

## 2015-06-09 NOTE — Progress Notes (Signed)
Pre visit review using our clinic review tool, if applicable. No additional management support is needed unless otherwise documented below in the visit note. 

## 2015-06-09 NOTE — Patient Instructions (Signed)
Jodi Keller ED.  I will be thinking of you- wish you the best.   [name]

## 2015-06-09 NOTE — Progress Notes (Signed)
Subjective:    Patient ID: Jodi Keller, female    DOB: 17-Oct-1955, 60 y.o.   MRN: [id number]   Jodi Keller is a 60 y.o. female who presents today for an acute visit.    HPI Comments: Here for evaluation of dizziness yesterday at work. Broke out in a sweat while walking back from bathroom and became dizzy. Has felt weak and exhausted since then. Went home and slept with relief of dizziness. No longer dizzy however energy is still low. Notes other dizzy episodes in the past triggered by turning head too fast. Had had breakfast yesterday morning. Mid sternal pressure.   Denies exertional numbness or tingling radiating to left arm or jaw, palpitations,  frequent headaches, changes in vision, or shortness of breath.      Dizziness This is a new problem. The current episode started yesterday. The problem has been resolved. Associated symptoms include diaphoresis and fatigue. Pertinent negatives include no arthralgias, chest pain, chills, congestion, coughing, fever, headaches, myalgias, nausea, sore throat or vomiting. The symptoms are aggravated by walking (after walking to bathroom, turning head).   Past Medical History  Diagnosis Date  . Hypertension   . Anemia   . GERD (gastroesophageal reflux disease)   . Morbid obesity ([place])   . Uterine prolapse   . Bilateral carpal tunnel syndrome 12/16/2010  . 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 Hyperlipidemia 02/25/2011  . Generalized anxiety disorder 11/17/2014   Allergies: Review of patient's allergies indicates no known allergies. Current Outpatient Prescriptions on File Prior to Visit  Medication Sig Dispense Refill  . benazepril (LOTENSIN) 20 MG tablet Take 1 tablet (20 mg total) by mouth daily. 90 tablet 3  . carisoprodol (SOMA) 350 MG tablet TAKE ONE TABLET BY MOUTH TWICE DAILY AS NEEDED FOR MUSCLE SPASMS 60 tablet 5  . cholecalciferol (VITAMIN D) 1000 units tablet Take 1 tablet (1,000 Units total) by mouth daily  as needed. Often forgets to take and doesn't know strength 30 tablet 3  . clotrimazole-betamethasone (LOTRISONE) cream Use as directed twice a day as needed 15 g 4  . diclofenac (VOLTAREN) 75 MG EC tablet TAKE 1 TABLET BY MOUTH TWICE DAILY AS NEEDED 60 tablet 0  . DULoxetine (CYMBALTA) 60 MG capsule Take 1 capsule (60 mg total) by mouth daily. 30 capsule 11  . hydrochlorothiazide (MICROZIDE) 12.5 MG capsule Take 1 capsule (12.5 mg total) by mouth daily. 90 capsule 3  . nystatin (MYCOSTATIN) powder Use as directed twice daily to affected area 30 g 2  . omeprazole (PRILOSEC) 20 MG capsule Take 1 capsule (20 mg total) by mouth daily. 30 capsule 5  . potassium chloride (K-DUR,KLOR-CON) 10 MEQ tablet Take 1 tablet (10 mEq total) by mouth daily. 90 tablet 1  . vitamin e (AQUASOL E) 15 UNIT/0.3ML SOLN solution Take 2 mLs (100 Units total) by mouth daily as needed. Takes when she remembers and doesn't know strength. 18 mL 2  . ALPRAZolam (XANAX) 0.25 MG tablet Take 1 tablet (0.25 mg total) by mouth daily as needed for anxiety. (Patient not taking: Reported on 06/09/2015) 30 tablet 2   No current facility-administered medications on file prior to visit.    Social History  Substance Use Topics  . Smoking status: Never Smoker   . Smokeless tobacco: None  . Alcohol Use: Yes     Comment: rare    Review of Systems  Constitutional: Positive for diaphoresis and fatigue. Negative for fever, chills and unexpected weight change.  HENT: Negative for congestion, ear pain, sinus pressure and sore throat.   Eyes: Negative for visual disturbance.  Respiratory: Negative for cough, shortness of breath and wheezing.   Cardiovascular: Negative for chest pain, palpitations and leg swelling.  Gastrointestinal: Negative for nausea and vomiting.  Musculoskeletal: Negative for myalgias and arthralgias.  Neurological: Positive for dizziness. Negative for syncope and headaches.      Objective:    BP 130/80 mmHg   Pulse 97  Temp(Src) 97.8 F (36.6 C) (Oral)  Ht 5\' 7"  (1.702 m)  Wt 373 lb (169.192 kg)  BMI 58.41 kg/m2  SpO2 97%  LMP 01/12/2011   Physical Exam  Constitutional: She appears well-developed and well-nourished.  HENT:  Head: Normocephalic and atraumatic.  Right Ear: Hearing, tympanic membrane, external ear and ear canal normal. No swelling or tenderness. Tympanic membrane is not erythematous and not bulging. No middle ear effusion.  Left Ear: Tympanic membrane, external ear and ear canal normal. No swelling or tenderness. Tympanic membrane is not erythematous and not bulging.  No middle ear effusion.  Nose: Nose normal. No rhinorrhea. Right sinus exhibits no maxillary sinus tenderness and no frontal sinus tenderness. Left sinus exhibits no maxillary sinus tenderness and no frontal sinus tenderness.  Mouth/Throat: Uvula is midline, oropharynx is clear and moist and mucous membranes are normal. No posterior oropharyngeal edema or posterior oropharyngeal erythema.  Eyes: Conjunctivae, EOM and lids are normal. Pupils are equal, round, and reactive to light. Lids are everted and swept, no foreign bodies found.  Normal fundus bilaterally  Cardiovascular: Normal rate, regular rhythm, normal heart sounds and normal pulses.   Pulmonary/Chest: Effort normal and breath sounds normal. She has no wheezes. She has no rhonchi. She has no rales.  Lymphadenopathy:       Head (right side): No submental, no submandibular, no tonsillar, no preauricular, no posterior auricular and no occipital adenopathy present.       Head (left side): No submental, no submandibular, no tonsillar, no preauricular, no posterior auricular and no occipital adenopathy present.    She has no cervical adenopathy.       Right cervical: No superficial cervical, no deep cervical and no posterior cervical adenopathy present.      Left cervical: No superficial cervical, no deep cervical and no posterior cervical adenopathy present.    Neurological: She is alert. She has normal strength. No cranial nerve deficit or sensory deficit. She displays a negative Romberg sign.  Reflex Scores:      Bicep reflexes are 2+ on the right side and 2+ on the left side.      Patellar reflexes are 2+ on the right side and 2+ on the left side. Grip equal and strong bilateral upper extremities. Gait strong and steady. Able to perform  finger-to-nose without difficulty.  Dix hall pike maneuver did not elicit dizziness. No nystagmus noted. Patient denied nausea or vertigo during maneuver.    Skin: Skin is warm and dry.  Psychiatric: She has a normal mood and affect. Her speech is normal and behavior is normal. Thought content normal.  Vitals reviewed.      Assessment & Plan:   1. Dizziness and giddiness   2. Substernal chest pain Concern the patient store yesterday while she was walking that she became diaphoretic, dizzy, and describes a substernal chest pain, and fatigue. With my concern for cardiac etiology greatly elevated, I decided not to order an EKG as this would not have changed my medical advice for further, prompt evaluation.  I was unable to elicit vertigo with Dix-Hallpike maneuver. No sinus congestion.   Advised patient to get emergency room for evaluation for MI.     I am having Ms. Hogsett maintain her carisoprodol, ALPRAZolam, benazepril, cholecalciferol, clotrimazole-betamethasone, DULoxetine, hydrochlorothiazide, nystatin, omeprazole, potassium chloride, vitamin e, and diclofenac.   No orders of the defined types were placed in this encounter.     Start medications as prescribed and explained to patient on After Visit Summary ( AVS). Risks, benefits, and alternatives of the medications and treatment plan prescribed today were discussed, and patient expressed understanding.   Education regarding symptom management and diagnosis given to patient.   Follow-up:Plan follow-up as discussed or as needed if any worsening  symptoms or change in condition.   Continue to follow with Cathlean Cower, MD for routine health maintenance.   [name] and I agreed with plan.   Mable Paris, FNP

## 2015-06-11 ENCOUNTER — Telehealth: Payer: Self-pay | Admitting: Internal Medicine

## 2015-06-11 NOTE — Telephone Encounter (Signed)
[facility] for [facility] next available, any provider

## 2015-06-11 NOTE — Telephone Encounter (Signed)
Patient states she seen Mable Paris last week and was sent to [facility].  States [place] told patient to get Dr. Jenny Reichmann to refer her for a stress test.  Can patient be referred and does patient need to schedule appointment with Dr. Jenny Reichmann?

## 2015-06-11 NOTE — Telephone Encounter (Signed)
Please advise 

## 2015-06-11 NOTE — Telephone Encounter (Signed)
Got scheduled  °

## 2015-06-16 ENCOUNTER — Encounter: Payer: Self-pay | Admitting: Internal Medicine

## 2015-06-16 ENCOUNTER — Ambulatory Visit (INDEPENDENT_AMBULATORY_CARE_PROVIDER_SITE_OTHER): Payer: PRIVATE HEALTH INSURANCE | Admitting: Internal Medicine

## 2015-06-16 VITALS — BP 144/80 | HR 112 | Resp 20 | Wt 375.0 lb

## 2015-06-16 DIAGNOSIS — I10 Essential (primary) hypertension: Secondary | ICD-10-CM

## 2015-06-16 DIAGNOSIS — R079 Chest pain, unspecified: Secondary | ICD-10-CM

## 2015-06-16 DIAGNOSIS — L732 Hidradenitis suppurativa: Secondary | ICD-10-CM | POA: Diagnosis not present

## 2015-06-16 MED ORDER — DOXYCYCLINE HYCLATE 100 MG PO TABS: 100.0000 mg | ORAL_TABLET | Freq: Two times a day (BID) | ORAL | Status: DC

## 2015-06-16 NOTE — Patient Instructions (Addendum)
Please take all new medication as prescribed - the antibiotic  Please continue all other medications as before, and refills have been done if requested.  Please have the pharmacy call with any other refills you may need.  Please keep your appointments with your specialists as you may have planned  You will be contacted regarding the referral for: stress test

## 2015-06-16 NOTE — Progress Notes (Signed)
Subjective:    Patient ID: Jodi Keller, female    DOB: [birth year], 60 y.o.   MRN: [id number]  HPI  Here to f/u with c/o 3 days onset right axillary tender, swelling lump without drainage, rash, high fever, neck, shoulder or arm pain.  But does c/o feeling exhausted, occas dizzy and and 1-2 wks intermittent SSCP, dull, like a pressure like swallowing a lump, no radiation, no n/v, sob, diaphoresis, palp or syncope.  Pt denies new neurological symptoms such as new headache, or facial or extremity weakness or numbness   Pt denies polydipsia, polyuria.   Pt denies fever, wt loss, night sweats, loss of appetite, or other constitutional symptoms Past Medical History  Diagnosis Date  . Hypertension   . Anemia   . GERD (gastroesophageal reflux disease)   . Morbid obesity ([place])   . Uterine prolapse   . Bilateral carpal tunnel syndrome 12/16/2010  . 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 Hyperlipidemia 02/25/2011  . Generalized anxiety disorder 11/17/2014   Past Surgical History  Procedure Laterality Date  . Cesarean section    . Foot surgery      reports that she has never smoked. She does not have any smokeless tobacco history on file. She reports that she drinks alcohol. She reports that she does not use illicit drugs. family history includes Cancer in her father, sister, and sister; Diabetes in her maternal grandmother; Thyroid disease in her other. No Known Allergies Current Outpatient Prescriptions on File Prior to Visit  Medication Sig Dispense Refill  . ALPRAZolam (XANAX) 0.25 MG tablet Take 1 tablet (0.25 mg total) by mouth daily as needed for anxiety. 30 tablet 2  . aspirin 81 MG chewable tablet Chew 1 tablet (81 mg total) by mouth daily. 30 tablet 0  . benazepril (LOTENSIN) 20 MG tablet Take 1 tablet (20 mg total) by mouth daily. 90 tablet 3  . Calcium-Magnesium-Zinc 167-83-8 MG TABS Take 1 tablet by mouth daily.    . carisoprodol (SOMA) 350 MG tablet TAKE ONE  TABLET BY MOUTH TWICE DAILY AS NEEDED FOR MUSCLE SPASMS 60 tablet 5  . cholecalciferol (VITAMIN D) 1000 units tablet Take 1 tablet (1,000 Units total) by mouth daily as needed. Often forgets to take and doesn't know strength 30 tablet 3  . clotrimazole-betamethasone (LOTRISONE) cream Use as directed twice a day as needed (Patient taking differently: Use as directed twice a day as needed for anti-fungal) 15 g 4  . diclofenac (VOLTAREN) 75 MG EC tablet TAKE 1 TABLET BY MOUTH TWICE DAILY AS NEEDED (Patient taking differently: TAKE 1 TABLET BY MOUTH TWICE DAILY AS NEEDED PAIN) 60 tablet 0  . DULoxetine (CYMBALTA) 60 MG capsule Take 1 capsule (60 mg total) by mouth daily. 30 capsule 11  . hydrochlorothiazide (MICROZIDE) 12.5 MG capsule Take 1 capsule (12.5 mg total) by mouth daily. 90 capsule 3  . Multiple Vitamins-Minerals (MULTIVITAMIN & MINERAL PO) Take 1 tablet by mouth daily.    [name] nystatin (MYCOSTATIN) powder Use as directed twice daily to affected area 30 g 2  . omeprazole (PRILOSEC) 20 MG capsule Take 1 capsule (20 mg total) by mouth daily. 30 capsule 5  . potassium chloride (K-DUR,KLOR-CON) 10 MEQ tablet Take 1 tablet (10 mEq total) by mouth daily. 90 tablet 1  . vitamin e (AQUASOL E) 15 UNIT/0.3ML SOLN solution Take 2 mLs (100 Units total) by mouth daily as needed. Takes when she remembers and doesn't know strength. 18 mL 2  No current facility-administered medications on file prior to visit.   Review of Systems  Constitutional: Negative for unusual diaphoresis or night sweats HENT: Negative for ear swelling or discharge Eyes: Negative for worsening visual haziness  Respiratory: Negative for choking and stridor.   Gastrointestinal: Negative for distension or worsening eructation Genitourinary: Negative for retention or change in urine volume.  Musculoskeletal: Negative for other MSK pain or swelling Skin: Negative for color change and worsening wound Neurological: Negative for tremors and  numbness other than noted  Psychiatric/Behavioral: Negative for decreased concentration or agitation other than above       Objective:   Physical Exam BP 144/80 mmHg  Pulse 112  Resp 20  Wt 375 lb (170.099 kg)  SpO2 97%  LMP 01/12/2011 VS noted, not ill apeparing Constitutional: Pt appears in no apparent distress HENT: Head: NCAT.  Right Ear: External ear normal.  Left Ear: External ear normal.  Eyes: . Pupils are equal, Keller, and reactive to light. Conjunctivae and EOM are normal Neck: Normal range of motion. Neck supple No LA Right axilla with 1 -2 cm subq tender nodule with smaller < 1/2 cm one lateral, no overlying skin change or driainage.  Cardiovascular: Normal rate and regular rhythm.   Pulmonary/Chest: Effort normal and breath sounds without rales or wheezing.  Abd:  Soft, NT, ND, + BS, no inguinal LA Neurological: Pt is alert. Not confused , motor grossly intact Skin: Skin is warm. No rash, no LE edema Psychiatric: Pt behavior is normal. No agitation.     Assessment & Plan:

## 2015-06-16 NOTE — Progress Notes (Signed)
Pre visit review using our clinic review tool, if applicable. No additional management support is needed unless otherwise documented below in the visit note. 

## 2015-06-20 NOTE — Assessment & Plan Note (Signed)
Mild to mod, for antibx course,  to f/u any worsening symptoms or concerns 

## 2015-06-20 NOTE — Assessment & Plan Note (Signed)
stable overall by history and exam, recent data reviewed with pt, and pt to continue medical treatment as before,  to f/u any worsening symptoms or concerns BP Readings from Last 3 Encounters:  06/16/15 144/80  06/09/15 126/67  06/09/15 130/80

## 2015-06-20 NOTE — Assessment & Plan Note (Signed)
Atypical, declines ecg but for stress test, cont same tx,  to f/u any worsening symptoms or concerns

## 2015-06-22 ENCOUNTER — Other Ambulatory Visit: Payer: Self-pay | Admitting: Internal Medicine

## 2015-06-22 MED ORDER — DOXYCYCLINE HYCLATE 100 MG PO TABS: 100.0000 mg | ORAL_TABLET | Freq: Two times a day (BID) | ORAL | Status: DC

## 2015-06-22 NOTE — Addendum Note (Signed)
Addended by: Biagio Borg on: 06/22/2015 06:18 PM   Modules accepted: Orders

## 2015-07-01 ENCOUNTER — Other Ambulatory Visit: Payer: Self-pay | Admitting: Internal Medicine

## 2015-07-22 ENCOUNTER — Other Ambulatory Visit: Payer: Self-pay | Admitting: Internal Medicine

## 2015-07-23 ENCOUNTER — Telehealth (HOSPITAL_COMMUNITY): Payer: Self-pay

## 2015-07-23 NOTE — Telephone Encounter (Signed)
Encounter complete. 

## 2015-07-28 ENCOUNTER — Encounter (HOSPITAL_COMMUNITY): Payer: PRIVATE HEALTH INSURANCE

## 2015-07-28 ENCOUNTER — Ambulatory Visit (HOSPITAL_COMMUNITY): Payer: PRIVATE HEALTH INSURANCE

## 2015-07-29 ENCOUNTER — Inpatient Hospital Stay (HOSPITAL_COMMUNITY): Admission: RE | Admit: 2015-07-29 | Payer: PRIVATE HEALTH INSURANCE | Source: Ambulatory Visit

## 2015-07-29 ENCOUNTER — Ambulatory Visit (HOSPITAL_COMMUNITY): Payer: PRIVATE HEALTH INSURANCE

## 2015-07-30 ENCOUNTER — Ambulatory Visit (HOSPITAL_COMMUNITY): Payer: PRIVATE HEALTH INSURANCE

## 2015-08-13 ENCOUNTER — Other Ambulatory Visit: Payer: Self-pay | Admitting: *Deleted

## 2015-08-13 MED ORDER — DICLOFENAC SODIUM 75 MG PO TBEC: 75.0000 mg | DELAYED_RELEASE_TABLET | Freq: Two times a day (BID) | ORAL | Status: DC | PRN

## 2015-08-13 NOTE — Telephone Encounter (Signed)
Pharmacy left msg on triage requesting refill on pt diclofenac 75 mg. Sent electronically...Johny Chess

## 2015-09-03 ENCOUNTER — Ambulatory Visit (INDEPENDENT_AMBULATORY_CARE_PROVIDER_SITE_OTHER): Payer: PRIVATE HEALTH INSURANCE | Admitting: Internal Medicine

## 2015-09-03 ENCOUNTER — Encounter: Payer: Self-pay | Admitting: Internal Medicine

## 2015-09-03 VITALS — BP 140/80 | HR 100 | Temp 98.6°F | Resp 20 | Wt 383.0 lb

## 2015-09-03 DIAGNOSIS — M25512 Pain in left shoulder: Secondary | ICD-10-CM | POA: Diagnosis not present

## 2015-09-03 DIAGNOSIS — I10 Essential (primary) hypertension: Secondary | ICD-10-CM

## 2015-09-03 DIAGNOSIS — F411 Generalized anxiety disorder: Secondary | ICD-10-CM

## 2015-09-03 MED ORDER — ACETAMINOPHEN-CODEINE 300-30 MG PO TABS: 1.0000 | ORAL_TABLET | Freq: Four times a day (QID) | ORAL | 0 refills | Status: DC | PRN

## 2015-09-03 NOTE — Progress Notes (Signed)
Pre visit review using our clinic review tool, if applicable. No additional management support is needed unless otherwise documented below in the visit note. 

## 2015-09-03 NOTE — Patient Instructions (Signed)
Please take all new medication as prescribed - the pain medication  You will be contacted regarding the referral for: Dr Tamala Julian - Sports Medicine in this office  (or you can make an appt as you leave today)  Please continue all other medications as before, and refills have been done if requested.  Please have the pharmacy call with any other refills you may need.  Please keep your appointments with your specialists as you may have planned

## 2015-09-03 NOTE — Assessment & Plan Note (Signed)
stable overall by history and exam, recent data reviewed with pt, and pt to continue medical treatment as before,  to f/u any worsening symptoms or concerns Lab Results  Component Value Date   WBC 10.2 06/09/2015   HGB 14.6 06/09/2015   HCT 45.0 06/09/2015   PLT 277 06/09/2015   GLUCOSE 108 (H) 06/09/2015   CHOL 196 11/17/2014   TRIG 159.0 (H) 11/17/2014   HDL 48.30 11/17/2014   LDLCALC 116 (H) 11/17/2014   ALT 22 11/17/2014   AST 20 11/17/2014   NA 141 06/09/2015   K 4.1 06/09/2015   CL 106 06/09/2015   CREATININE 0.74 06/09/2015   BUN 19 06/09/2015   CO2 27 06/09/2015   TSH 2.13 11/17/2014   HGBA1C 5.4 11/17/2014

## 2015-09-03 NOTE — Assessment & Plan Note (Signed)
C/w possible rot cuff tear, for pain control and refer Sport medicine ,  to f/u any worsening symptoms or concerns

## 2015-09-03 NOTE — Assessment & Plan Note (Signed)
stable overall by history and exam, recent data reviewed with pt, and pt to continue medical treatment as before,  to f/u any worsening symptoms or concerns BP Readings from Last 3 Encounters:  09/03/15 140/80  06/16/15 (!) 144/80  06/09/15 126/67

## 2015-09-03 NOTE — Progress Notes (Signed)
Subjective:    Patient ID: Jodi Keller, female    DOB: 1955-03-20, 60 y.o.   MRN: [id number]  HPI  Here to f/u with c/o left shoulder and upper lateral arm pain x 4 days, after placing the hand on a table the suddenly went backwards wrenching the arm backards, now with constant pain, sharp, without radiation, worse to place the arm behind her, better to not do that, and much worse with severe to abduction and forward elevation and only able to do so to about 60 degrees.  Pt denies chest pain, increased sob or doe, wheezing, orthopnea, PND, increased LE swelling, palpitations, dizziness or syncope.  Pt denies new neurological symptoms such as new headache, or facial or extremity weakness or numbness   Pt denies polydipsia, polyuria  Denies worsening depressive symptoms, suicidal ideation, or panic Past Medical History:  Diagnosis Date  . Anemia   . Bilateral carpal tunnel syndrome 12/16/2010  . 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 Generalized anxiety disorder 11/17/2014  . GERD (gastroesophageal reflux disease)   . Hyperlipidemia 02/25/2011  . Hypertension   . Morbid obesity ([place])   . Uterine prolapse    Past Surgical History:  Procedure Laterality Date  . CESAREAN SECTION    . FOOT SURGERY      reports that she has never smoked. She does not have any smokeless tobacco history on file. She reports that she drinks alcohol. She reports that she does not use drugs. family history includes Cancer in her father, sister, and sister; Diabetes in her maternal grandmother; Thyroid disease in her other. No Known Allergies Current Outpatient Prescriptions on File Prior to Visit  Medication Sig Dispense Refill  . ALPRAZolam (XANAX) 0.25 MG tablet Take 1 tablet (0.25 mg total) by mouth daily as needed for anxiety. 30 tablet 2  . aspirin 81 MG chewable tablet Chew 1 tablet (81 mg total) by mouth daily. 30 tablet 0  . benazepril (LOTENSIN) 20 MG tablet Take 1 tablet (20 mg total)  by mouth daily. 90 tablet 3  . Calcium-Magnesium-Zinc 167-83-8 MG TABS Take 1 tablet by mouth daily.    . carisoprodol (SOMA) 350 MG tablet TAKE ONE TABLET BY MOUTH TWICE DAILY AS NEEDED FOR MUSCLE SPASMS 60 tablet 5  . cholecalciferol (VITAMIN D) 1000 units tablet Take 1 tablet (1,000 Units total) by mouth daily as needed. Often forgets to take and doesn't know strength 30 tablet 3  . clotrimazole-betamethasone (LOTRISONE) cream Use as directed twice a day as needed (Patient taking differently: Use as directed twice a day as needed for anti-fungal) 15 g 4  . diclofenac (VOLTAREN) 75 MG EC tablet Take 1 tablet (75 mg total) by mouth 2 (two) times daily as needed. 60 tablet 1  . doxycycline (VIBRA-TABS) 100 MG tablet Take 1 tablet (100 mg total) by mouth 2 (two) times daily. 20 tablet 0  . DULoxetine (CYMBALTA) 60 MG capsule Take 1 capsule (60 mg total) by mouth daily. 30 capsule 11  . hydrochlorothiazide (MICROZIDE) 12.5 MG capsule Take 1 capsule (12.5 mg total) by mouth daily. 90 capsule 3  . Multiple Vitamins-Minerals (MULTIVITAMIN & MINERAL PO) Take 1 tablet by mouth daily.    [name] nystatin (MYCOSTATIN) powder Use as directed twice daily to affected area 30 g 2  . omeprazole (PRILOSEC) 20 MG capsule Take 1 capsule (20 mg total) by mouth daily. 30 capsule 5  . potassium chloride (K-DUR,KLOR-CON) 10 MEQ tablet Take 1 tablet (10 mEq total)  by mouth daily. 90 tablet 1  . vitamin e (AQUASOL E) 15 UNIT/0.3ML SOLN solution Take 2 mLs (100 Units total) by mouth daily as needed. Takes when she remembers and doesn't know strength. 18 mL 2   No current facility-administered medications on file prior to visit.    Review of Systems  Constitutional: Negative for unusual diaphoresis or night sweats HENT: Negative for ear swelling or discharge Eyes: Negative for worsening visual haziness  Respiratory: Negative for choking and stridor.   Gastrointestinal: Negative for distension or worsening  eructation Genitourinary: Negative for retention or change in urine volume.  Musculoskeletal: Negative for other MSK pain or swelling Skin: Negative for color change and worsening wound Neurological: Negative for tremors and numbness other than noted  Psychiatric/Behavioral: Negative for decreased concentration or agitation other than above       Objective:   Physical Exam BP 140/80   Pulse 100   Temp 98.6 F (37 C) (Oral)   Resp 20   Wt (!) 383 lb (173.7 kg)   LMP 01/12/2011   SpO2 96%   BMI 59.99 kg/m  VS noted,  Constitutional: Pt appears in no apparent distress HENT: Head: NCAT.  Right Ear: External ear normal.  Left Ear: External ear normal.  Eyes: . Pupils are equal, round, and reactive to light. Conjunctivae and EOM are normal Neck: Normal range of motion. Neck supple.  Cardiovascular: Normal rate and regular rhythm.   Pulmonary/Chest: Effort normal and breath sounds without rales or wheezing.  Neurological: Pt is alert. Not confused , motor grossly intact Skin: Skin is warm. No rash, no LE edema Psychiatric: Pt behavior is normal. No agitation. Mild nervous  Left shoulder NT or swelling except some tender at the deltoid insertion site mid humerous, but severe subjective pain to forward elev and abduction to 60 degrees       Assessment & Plan:

## 2015-09-09 ENCOUNTER — Other Ambulatory Visit: Payer: Self-pay | Admitting: Internal Medicine

## 2015-09-11 ENCOUNTER — Other Ambulatory Visit: Payer: Self-pay | Admitting: Internal Medicine

## 2015-09-16 NOTE — Progress Notes (Signed)
[name] [address] [place], [place] [address] Phone: [phone] Subjective:    I'm seeing this patient by the request  of:  Cathlean Cower, MD   CC: Left shoulder pain  [id number]  Jodi Keller is a 60 y.o. female coming in with complaint of left shoulder pain. Patient states that she wrenched her arm backwards on following from a table. This happened approximately 2 weeks ago. Has a constant sharp pain without any radiation. Seems to go down the lateral aspect of the arm. Severe pain with abduction and elevation of the arm. States that certain movements and give her a 9 out of 10 pain. Uncomfortable at night.     Past Medical History:  Diagnosis Date  . Anemia   . Bilateral carpal tunnel syndrome 12/16/2010  . 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 Generalized anxiety disorder 11/17/2014  . GERD (gastroesophageal reflux disease)   . Hyperlipidemia 02/25/2011  . Hypertension   . Morbid obesity ([place])   . Uterine prolapse    Past Surgical History:  Procedure Laterality Date  . CESAREAN SECTION    . FOOT SURGERY     Social History   Social History  . Marital status: Divorced    Spouse name: N/A  . Number of children: 2  . Years of education: N/A   Social History Main Topics  . Smoking status: Never Smoker  . Smokeless tobacco: None  . Alcohol use Yes     Comment: rare  . Drug use: No  . Sexual activity: No   Other Topics Concern  . None   Social History Narrative  . None   No Known Allergies Family History  Problem Relation Age of Onset  . Cancer Father     Prostate  . Cancer Sister     Multiple Myeloma  . Diabetes Maternal Grandmother   . Cancer Sister     Breast  . Thyroid disease Other     Multiple    Past medical history, social, surgical and family history all reviewed in electronic medical record.  No pertanent information unless stated regarding to the chief complaint.   Review of Systems:  No headache, visual changes, nausea, vomiting, diarrhea, constipation, dizziness, abdominal pain, skin rash, fevers, chills, night sweats, weight loss, swollen lymph nodes, body aches, joint swelling, muscle aches, chest pain, shortness of breath, mood changes.   Objective  Blood pressure 128/72, pulse 96, height '5\' 7"'  (1.702 m), weight (!) 386 lb (175.1 kg), last menstrual period 01/12/2011, SpO2 97 %.  General: No apparent distress alert and oriented x3 mood and affect normal, dressed appropriately.  HEENT: Pupils equal, extraocular movements intact  Respiratory: Patient's speak in full sentences and does not appear short of breath  Cardiovascular: No lower extremity edema, non tender, no erythema  Skin: Warm dry intact with no signs of infection or rash on extremities or on axial skeleton.  Abdomen: Soft nontender  Neuro: Cranial nerves II through XII are intact, neurovascularly intact in all extremities with 2+ DTRs and 2+ pulses.  Lymph: No lymphadenopathy of posterior or anterior cervical chain or axillae bilaterally.  Gait normal with good balance and coordination.  MSK:  Non tender with full range of motion and good stability and symmetric strength and tone of  elbows, wrist, hip, knee and ankles bilaterally.   Shoulder: left Inspection reveals no abnormalities, atrophy or asymmetry. Tenderness to palpation generalized over the shoulder ROM is full in all  planes passively. Rotator cuff strength 4 out of 5 compared to contralateral side signs of impingement with positive Neer and Hawkin's tests, but negative empty can sign. Speeds and Yergason's tests normal. Positive labral pathology Normal scapular function observed. Positive painful arc and drop arm No apprehension sign Contralateral shoulder unremarkable.  MSK US performed of: left This study was ordered, performed, and interpreted by Charlann Boxer D.O.  Shoulder:   Supraspinatus:  Large tear noted with mild traction. Seems to  be full-thickness. Infraspinatus:  Appears normal on long and transverse views. Significant increase in Doppler flow Subscapularis:  Tearing noted but no true retraction. Mild healing noted. Teres Minor:  Appears normal on long and transverse views. AC joint:  Capsule undistended, no geyser sign. Glenohumeral Joint:  Appears normal without effusion. Glenoid Labrum:  Mild degenerative changes noted Biceps Tendon:  Appears normal on long and transverse views, no fraying of tendon, tendon located in intertubercular groove, no subluxation with shoulder internal or external rotation.  Impression: Large supraspinatus tear with mild retraction.      Impression and Recommendations:     This case required medical decision making of moderate complexity.      Note: This dictation was prepared with Dragon dictation along with smaller phrase technology. Any transcriptional errors that result from this process are unintentional.

## 2015-09-17 ENCOUNTER — Encounter: Payer: Self-pay | Admitting: Family Medicine

## 2015-09-17 ENCOUNTER — Ambulatory Visit (INDEPENDENT_AMBULATORY_CARE_PROVIDER_SITE_OTHER): Payer: PRIVATE HEALTH INSURANCE | Admitting: Family Medicine

## 2015-09-17 ENCOUNTER — Ambulatory Visit (INDEPENDENT_AMBULATORY_CARE_PROVIDER_SITE_OTHER)
Admission: RE | Admit: 2015-09-17 | Discharge: 2015-09-17 | Disposition: A | Discharge status: Home / Self Care | Payer: PRIVATE HEALTH INSURANCE | Source: Ambulatory Visit | Attending: Family Medicine | Admitting: Family Medicine

## 2015-09-17 ENCOUNTER — Other Ambulatory Visit: Payer: Self-pay

## 2015-09-17 VITALS — BP 128/72 | HR 96 | Ht 67.0 in | Wt 386.0 lb

## 2015-09-17 DIAGNOSIS — M25512 Pain in left shoulder: Secondary | ICD-10-CM

## 2015-09-17 DIAGNOSIS — M75102 Unspecified rotator cuff tear or rupture of left shoulder, not specified as traumatic: Secondary | ICD-10-CM | POA: Insufficient documentation

## 2015-09-17 MED ORDER — DICLOFENAC SODIUM 2 % TD SOLN: TRANSDERMAL | 3 refills | Status: DC

## 2015-09-17 NOTE — Assessment & Plan Note (Signed)
Patient does have a supraspinatus tear. We discussed icing regimen and home exercises. We discussed with Clarene Critchley do an which was to avoid. Topical anti-inflammatories given. Patient wants to avoid physical therapy at this time. X-rays pending. Follow-up in 3 weeks. If worsening symptoms MRI may be necessary secondary to the tear in changing medical management.

## 2015-09-17 NOTE — Patient Instructions (Signed)
Good to see you.  Xray downstairs Ice 20 minutes 2 times daily. Usually after activity and before bed. pennsaid pinkie amount topically 2 times daily as needed.  Exercises 3 times a week.  OK with water aerobics but keep hands within peripheral vision.  Avoid heavy lifting.  Over the counter vitamin D 2000 IU daily can aide in healing. See me again in 3 weeks and if not better we may need either physical therapy or MRI.

## 2015-09-22 ENCOUNTER — Other Ambulatory Visit: Payer: Self-pay | Admitting: Internal Medicine

## 2015-10-12 ENCOUNTER — Encounter: Payer: Self-pay | Admitting: Family Medicine

## 2015-10-12 ENCOUNTER — Ambulatory Visit (INDEPENDENT_AMBULATORY_CARE_PROVIDER_SITE_OTHER): Payer: PRIVATE HEALTH INSURANCE | Admitting: Family Medicine

## 2015-10-12 ENCOUNTER — Ambulatory Visit (INDEPENDENT_AMBULATORY_CARE_PROVIDER_SITE_OTHER)
Admission: RE | Admit: 2015-10-12 | Discharge: 2015-10-12 | Disposition: A | Discharge status: Home / Self Care | Payer: PRIVATE HEALTH INSURANCE | Source: Ambulatory Visit | Attending: Family Medicine | Admitting: Family Medicine

## 2015-10-12 VITALS — BP 118/74 | HR 88 | Wt 387.0 lb

## 2015-10-12 DIAGNOSIS — M75102 Unspecified rotator cuff tear or rupture of left shoulder, not specified as traumatic: Secondary | ICD-10-CM

## 2015-10-12 DIAGNOSIS — M542 Cervicalgia: Secondary | ICD-10-CM

## 2015-10-12 DIAGNOSIS — M25512 Pain in left shoulder: Secondary | ICD-10-CM | POA: Diagnosis not present

## 2015-10-12 NOTE — Progress Notes (Signed)
[name] [address] [place], [place] [address] Phone: [phone] Subjective:    I'm seeing this patient by the request  of:  Cathlean Cower, MD   CC: Left shoulder pain Follow-up [id number]  Jodi Keller is a 60 y.o. female coming in with complaint of left shoulder pain. Found to have a rotator cuff tear. Patient elected try conservative therapy with home exercises, topical anti-inflammatories, icing as well as vitamin D supplementation. Patient states The pain seems to be better. Continues to have weakness. Has been doing the aquatic exercise classes with some mild improvement. Patient states that overall she think she has decreased pain by 70% that unfortunate no improvement in strength or movement.   x-rays were ordered and independently visualized by me showing patient's left shoulder having mild osteoarthritic changes of the glenohumeral joint.  Past Medical History:  Diagnosis Date  . Anemia   . Bilateral carpal tunnel syndrome 12/16/2010  . 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 Generalized anxiety disorder 11/17/2014  . GERD (gastroesophageal reflux disease)   . Hyperlipidemia 02/25/2011  . Hypertension   . Morbid obesity ([place])   . Uterine prolapse    Past Surgical History:  Procedure Laterality Date  . CESAREAN SECTION    . FOOT SURGERY     Social History   Social History  . Marital status: Divorced    Spouse name: N/A  . Number of children: 2  . Years of education: N/A   Social History Main Topics  . Smoking status: Never Smoker  . Smokeless tobacco: None  . Alcohol use Yes     Comment: rare  . Drug use: No  . Sexual activity: No   Other Topics Concern  . None   Social History Narrative  . None   No Known Allergies Family History  Problem Relation Age of Onset  . Cancer Father     Prostate  . Cancer Sister     Multiple Myeloma  . Diabetes Maternal Grandmother   . Cancer Sister     Breast  .  Thyroid disease Other     Multiple    Past medical history, social, surgical and family history all reviewed in electronic medical record.  No pertanent information unless stated regarding to the chief complaint.   Review of Systems: No headache, visual changes, nausea, vomiting, diarrhea, constipation, dizziness, abdominal pain, skin rash, fevers, chills, night sweats, weight loss, swollen lymph nodes, body aches, joint swelling, muscle aches, chest pain, shortness of breath, mood changes.   Objective  Blood pressure 118/74, pulse 88, weight (!) 387 lb (175.5 kg), last menstrual period 01/12/2011, SpO2 98 %.  General: No apparent distress alert and oriented x3 mood and affect normal, dressed appropriately.  HEENT: Pupils equal, extraocular movements intact  Respiratory: Patient's speak in full sentences and does not appear short of breath  Cardiovascular: No lower extremity edema, non tender, no erythema  Skin: Warm dry intact with no signs of infection or rash on extremities or on axial skeleton.  Abdomen: Soft nontender  Neuro: Cranial nerves II through XII are intact, neurovascularly intact in all extremities with 2+ DTRs and 2+ pulses.  Lymph: No lymphadenopathy of posterior or anterior cervical chain or axillae bilaterally.  Gait normal with good balance and coordination.  MSK:  Non tender with full range of motion and good stability and symmetric strength and tone of  elbows, wrist, hip, knee and ankles bilaterally.   Shoulder: left  Inspection reveals no abnormalities, atrophy or asymmetry. Tenderness to palpation generalized over the shoulder Passively patient does have near full range of motion. Actively only has forward flexion to this 80 Rotator cuff strength 3+ to 5 which is worse than previous exam signs of impingement with positive Neer and Hawkin's tests, but negative empty can sign. Speeds and Yergason's tests normal. Positive labral pathology Normal scapular function  observed. Positive painful arc and drop arm No apprehension sign Contralateral shoulder unremarkable.       Impression and Recommendations:     This case required medical decision making of moderate complexity.      Note: This dictation was prepared with Dragon dictation along with smaller phrase technology. Any transcriptional errors that result from this process are unintentional.

## 2015-10-12 NOTE — Patient Instructions (Addendum)
ood to see you  I am concern for the shoulder We will get xray of the neck today and MRI of the shoulder.  We need to make sure it is not retracted because that could change our management and I want you to enjoy your trip.  Depending on what we find I will call you and we will either do referral to surgeon or we will consider injection and referral to physical therapy.

## 2015-10-12 NOTE — Assessment & Plan Note (Signed)
Patient previously was found to have more of a rotator cuff tear with mild retraction on ultrasound. Patient has had worsening weakness since previous exam even though patient states that the pain has improved. Patient unfortunately with the retraction that was seen previously and I would like to have an MRI of the shoulder for further evaluation. Patient if retraction seems to worsen she may not have the opportunity to actually have it surgically repaired. Patient understands this and would like to move ahead with advance imaging. We discussed continuing icing regimen and home exercises. We discussed which activities to do an which was potentially avoid. Patient will remain active. Depending on findings we'll discuss surgical intervention. Possible injection and formal physical therapy.

## 2015-11-02 ENCOUNTER — Encounter: Payer: Self-pay | Admitting: Family Medicine

## 2015-11-06 ENCOUNTER — Inpatient Hospital Stay: Admission: RE | Admit: 2015-11-06 | Payer: PRIVATE HEALTH INSURANCE | Source: Ambulatory Visit

## 2015-11-11 ENCOUNTER — Other Ambulatory Visit: Payer: Self-pay | Admitting: Internal Medicine

## 2015-11-13 ENCOUNTER — Ambulatory Visit (HOSPITAL_COMMUNITY): Admission: RE | Admit: 2015-11-13 | Payer: PRIVATE HEALTH INSURANCE | Source: Ambulatory Visit

## 2015-11-16 ENCOUNTER — Ambulatory Visit (HOSPITAL_COMMUNITY)
Admission: RE | Admit: 2015-11-16 | Discharge: 2015-11-16 | Disposition: A | Discharge status: Home / Self Care | Payer: PRIVATE HEALTH INSURANCE | Source: Ambulatory Visit | Attending: Family Medicine | Admitting: Family Medicine

## 2015-11-16 DIAGNOSIS — M659 Synovitis and tenosynovitis, unspecified: Secondary | ICD-10-CM | POA: Diagnosis not present

## 2015-11-16 DIAGNOSIS — M25512 Pain in left shoulder: Secondary | ICD-10-CM | POA: Insufficient documentation

## 2015-11-17 ENCOUNTER — Encounter: Payer: Self-pay | Admitting: Family Medicine

## 2015-11-17 DIAGNOSIS — M75102 Unspecified rotator cuff tear or rupture of left shoulder, not specified as traumatic: Secondary | ICD-10-CM

## 2016-01-03 ENCOUNTER — Other Ambulatory Visit: Payer: Self-pay | Admitting: Internal Medicine

## 2016-01-04 NOTE — Telephone Encounter (Signed)
Done hardcopy to Corinne  

## 2016-01-04 NOTE — Telephone Encounter (Signed)
faxed

## 2016-01-09 ENCOUNTER — Other Ambulatory Visit: Payer: Self-pay | Admitting: Internal Medicine

## 2016-01-15 ENCOUNTER — Other Ambulatory Visit: Payer: Self-pay | Admitting: Internal Medicine

## 2016-01-16 ENCOUNTER — Other Ambulatory Visit: Payer: Self-pay | Admitting: Internal Medicine

## 2016-01-20 LAB — HM DEXA SCAN: HM Dexa Scan: NORMAL

## 2016-01-27 ENCOUNTER — Ambulatory Visit (INDEPENDENT_AMBULATORY_CARE_PROVIDER_SITE_OTHER): Payer: PRIVATE HEALTH INSURANCE | Admitting: Internal Medicine

## 2016-01-27 ENCOUNTER — Encounter: Payer: Self-pay | Admitting: Internal Medicine

## 2016-01-27 VITALS — BP 140/80 | HR 100 | Temp 97.6°F | Resp 20 | Wt 382.0 lb

## 2016-01-27 DIAGNOSIS — R059 Cough, unspecified: Secondary | ICD-10-CM

## 2016-01-27 DIAGNOSIS — R062 Wheezing: Secondary | ICD-10-CM

## 2016-01-27 DIAGNOSIS — M5442 Lumbago with sciatica, left side: Secondary | ICD-10-CM | POA: Diagnosis not present

## 2016-01-27 DIAGNOSIS — R05 Cough: Secondary | ICD-10-CM

## 2016-01-27 DIAGNOSIS — M545 Low back pain, unspecified: Secondary | ICD-10-CM | POA: Insufficient documentation

## 2016-01-27 MED ORDER — HYDROCODONE-ACETAMINOPHEN 5-325 MG PO TABS: 1.0000 | ORAL_TABLET | Freq: Four times a day (QID) | ORAL | 0 refills | Status: DC | PRN

## 2016-01-27 MED ORDER — LEVOFLOXACIN 500 MG PO TABS: 500.0000 mg | ORAL_TABLET | Freq: Every day | ORAL | 0 refills | Status: DC

## 2016-01-27 MED ORDER — PREDNISONE 10 MG PO TABS: ORAL_TABLET | ORAL | 0 refills | Status: DC

## 2016-01-27 MED ORDER — HYDROCODONE-HOMATROPINE 5-1.5 MG/5ML PO SYRP: 5.0000 mL | ORAL_SOLUTION | Freq: Four times a day (QID) | ORAL | 0 refills | Status: AC | PRN

## 2016-01-27 NOTE — Patient Instructions (Signed)
You had the steroid shot today  Please take all new medication as prescribed - the antibiotic, cough medicine as needed, prednisone, and the pain medication  Please continue all other medications as before, and refills have been done if requested.  Please have the pharmacy call with any other refills you may need.  Please keep your appointments with your specialists as you may have planned  Please return if not improved with the left leg as you may need MRI of the lower back

## 2016-01-27 NOTE — Progress Notes (Signed)
Subjective:    Patient ID: Jodi Keller, female    DOB: 05/26/1955, 61 y.o.   MRN: [id number]  HPI  .Here with acute onset mild to mod 2-3 days ST, HA, general weakness and malaise, with prod cough greenish sputum, but Pt denies chest pain, increased sob or doe, wheezing, orthopnea, PND, increased LE swelling, palpitations, dizziness or syncope, except for onset mild wheezing and sob since last PM.  Also with c/o left LBP assoc with numb whole left leg, x 1 wk after a mistep while shopping and lurched forward straining the back, caught herself from falling but grabbing the cart,  And no bowel or bladder change, fever, wt loss,  worsening LE pain/weakness, or falls, but has had gait change due to sharp recurring left groin pain as well.  Denies urinary symptoms such as dysuria, frequency, urgency, flank pain, hematuria or n/v, fever, chills.  Denies worsening reflux, abd pain, dysphagia, n/v, bowel change or blood.  No other new history Past Medical History:  Diagnosis Date  . Anemia   . Bilateral carpal tunnel syndrome 12/16/2010  . 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 Generalized anxiety disorder 11/17/2014  . GERD (gastroesophageal reflux disease)   . Hyperlipidemia 02/25/2011  . Hypertension   . Morbid obesity ([place])   . Uterine prolapse    Past Surgical History:  Procedure Laterality Date  . CESAREAN SECTION    . FOOT SURGERY      reports that she has never smoked. She does not have any smokeless tobacco history on file. She reports that she drinks alcohol. She reports that she does not use drugs. family history includes Cancer in her father, sister, and sister; Diabetes in her maternal grandmother; Thyroid disease in her other. No Known Allergies Current Outpatient Prescriptions on File Prior to Visit  Medication Sig Dispense Refill  . Acetaminophen-Codeine (TYLENOL/CODEINE #3) 300-30 MG tablet Take 1 tablet by mouth every 6 (six) hours as needed for pain. 40  tablet 0  . ALPRAZolam (XANAX) 0.25 MG tablet Take 1 tablet (0.25 mg total) by mouth daily as needed for anxiety. 30 tablet 2  . aspirin 81 MG chewable tablet Chew 1 tablet (81 mg total) by mouth daily. 30 tablet 0  . benazepril (LOTENSIN) 20 MG tablet TAKE 1 TABLET BY MOUTH EVERY DAY 90 tablet 2  . Calcium-Magnesium-Zinc 167-83-8 MG TABS Take 1 tablet by mouth daily.    . carisoprodol (SOMA) 350 MG tablet TAKE 1 TABLET BY MOUTH TWICE A DAY AS NEEDED FOR MUSCLE SPASMS 60 tablet 0  . cholecalciferol (VITAMIN D) 1000 units tablet Take 1 tablet (1,000 Units total) by mouth daily as needed. Often forgets to take and doesn't know strength 30 tablet 3  . clotrimazole-betamethasone (LOTRISONE) cream Use as directed twice a day as needed (Patient taking differently: Use as directed twice a day as needed for anti-fungal) 15 g 4  . diclofenac (VOLTAREN) 75 MG EC tablet TAKE 1 TABLET BY MOUTH TWICE A DAY AS NEEDED 60 tablet 0  . Diclofenac Sodium (PENNSAID) 2 % SOLN Apply 1 pump twice daily. 112 g 3  . doxycycline (VIBRA-TABS) 100 MG tablet Take 1 tablet (100 mg total) by mouth 2 (two) times daily. 20 tablet 0  . DULoxetine (CYMBALTA) 60 MG capsule Take 1 capsule (60 mg total) by mouth daily. 30 capsule 11  . hydrochlorothiazide (MICROZIDE) 12.5 MG capsule Take 1 capsule (12.5 mg total) by mouth daily. 90 capsule 3  . KLOR-CON M10  10 MEQ tablet TAKE 1 TABLET BY MOUTH DAILY. 90 tablet 1  . Multiple Vitamins-Minerals (MULTIVITAMIN & MINERAL PO) Take 1 tablet by mouth daily.    [name] nystatin (MYCOSTATIN) powder Use as directed twice daily to affected area 30 g 2  . omeprazole (PRILOSEC) 20 MG capsule TAKE ONE CAPSULE BY MOUTH EVERY DAY 30 capsule 5  . vitamin e (AQUASOL E) 15 UNIT/0.3ML SOLN solution Take 2 mLs (100 Units total) by mouth daily as needed. Takes when she remembers and doesn't know strength. 18 mL 2   No current facility-administered medications on file prior to visit.    Review of Systems    Constitutional: Negative for unusual diaphoresis or night sweats HENT: Negative for ear swelling or discharge Eyes: Negative for worsening visual haziness  Respiratory: Negative for choking and stridor.   Gastrointestinal: Negative for distension or worsening eructation Genitourinary: Negative for retention or change in urine volume.  Musculoskeletal: Negative for other MSK pain or swelling Skin: Negative for color change and worsening wound Neurological: Negative for tremors and numbness other than noted  Psychiatric/Behavioral: Negative for decreased concentration or agitation other than above   All other system neg per pt    Objective:   Physical Exam BP 140/80   Pulse 100   Temp 97.6 F (36.4 C) (Oral)   Resp 20   Wt (!) 382 lb (173.3 kg)   LMP 01/12/2011   SpO2 95%   BMI 59.83 kg/m  VS noted, mild ill Constitutional: Pt appears in no apparent distress HENT: Head: NCAT.  Right Ear: External ear normal.  Left Ear: External ear normal.  Bilat tm's with mild erythema.  Max sinus areas non tender.  Pharynx with mild erythema, no exudate Eyes: . Pupils are equal, round, and reactive to light. Conjunctivae and EOM are normal Neck: Normal range of motion. Neck supple.  Cardiovascular: Normal rate and regular rhythm.   Pulmonary/Chest: Effort normal and breath sounds decreased without rales but with few scattered wheezing.  Abd:  Soft, NT, ND, + BS Left groin without swelling, rash Spine: nontender Neurological: Pt is alert. Not confused , motor 5/5 intact Skin: Skin is warm. No rash, no LE edema Psychiatric: Pt behavior is normal. No agitation.  No other exam findings    Assessment & Plan:

## 2016-01-27 NOTE — Assessment & Plan Note (Signed)
Mild to mod, c/w bronchitis vs pna, declines cxr, for antibx course, cough med prn,  to f/u any worsening symptoms or concerns 

## 2016-01-27 NOTE — Assessment & Plan Note (Signed)
Mild to mod, for depomedrol IM 80, predpac asd, to f/u any worsening symptoms or concerns 

## 2016-01-27 NOTE — Progress Notes (Signed)
Pre visit review using our clinic review tool, if applicable. No additional management support is needed unless otherwise documented below in the visit note. 

## 2016-01-27 NOTE — Assessment & Plan Note (Signed)
For pain control, and steroid tx as above, consider MRI for persistent symptoms

## 2016-01-28 ENCOUNTER — Encounter: Payer: Self-pay | Admitting: Internal Medicine

## 2016-01-28 ENCOUNTER — Telehealth: Payer: Self-pay

## 2016-01-28 MED ORDER — LEVOFLOXACIN 500 MG PO TABS: 500.0000 mg | ORAL_TABLET | Freq: Every day | ORAL | 0 refills | Status: AC

## 2016-01-28 MED ORDER — PREDNISONE 10 MG PO TABS: ORAL_TABLET | ORAL | 0 refills | Status: DC

## 2016-01-28 NOTE — Telephone Encounter (Signed)
Medications sent to correct pharmacy and [place] taken out of patients chart.

## 2016-02-16 ENCOUNTER — Other Ambulatory Visit: Payer: Self-pay | Admitting: Internal Medicine

## 2016-02-19 ENCOUNTER — Other Ambulatory Visit: Payer: Self-pay | Admitting: Internal Medicine

## 2016-02-22 NOTE — Telephone Encounter (Signed)
faxed

## 2016-02-22 NOTE — Telephone Encounter (Signed)
Done hardcopy to Corinne  

## 2016-02-24 ENCOUNTER — Other Ambulatory Visit: Payer: Self-pay | Admitting: Internal Medicine

## 2016-02-28 ENCOUNTER — Other Ambulatory Visit: Payer: Self-pay | Admitting: *Deleted

## 2016-02-29 ENCOUNTER — Ambulatory Visit (INDEPENDENT_AMBULATORY_CARE_PROVIDER_SITE_OTHER): Payer: PRIVATE HEALTH INSURANCE

## 2016-02-29 ENCOUNTER — Ambulatory Visit (INDEPENDENT_AMBULATORY_CARE_PROVIDER_SITE_OTHER): Payer: PRIVATE HEALTH INSURANCE | Admitting: Podiatry

## 2016-02-29 ENCOUNTER — Encounter: Payer: Self-pay | Admitting: Podiatry

## 2016-02-29 ENCOUNTER — Other Ambulatory Visit: Payer: Self-pay | Admitting: *Deleted

## 2016-02-29 DIAGNOSIS — M779 Enthesopathy, unspecified: Secondary | ICD-10-CM | POA: Diagnosis not present

## 2016-02-29 DIAGNOSIS — M79672 Pain in left foot: Secondary | ICD-10-CM | POA: Diagnosis not present

## 2016-02-29 DIAGNOSIS — M79671 Pain in right foot: Secondary | ICD-10-CM

## 2016-02-29 MED ORDER — MELOXICAM 15 MG PO TABS: 15.0000 mg | ORAL_TABLET | Freq: Every day | ORAL | 1 refills | Status: DC

## 2016-02-29 NOTE — Progress Notes (Signed)
   Subjective:    Patient ID: Jodi Keller, female    DOB: 03-04-1955, 61 y.o.   MRN: [id number]  HPI:: She presents today with a chief complaint of pain to the plantar lateral aspect of her left foot. 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 She currently states that the foot appears to be curving down and in which is causing pain in this area, as she points to the fifth metatarsal base of the left foot. She states that is progressively gotten worse over the past several months. She is on the same that she feels very unstable while ambulating. Patient relates no trauma to the foot.    Review of Systems  Musculoskeletal: Positive for arthralgias.  All other systems reviewed and are negative.      Objective:   Physical Exam:I have reviewed her past medical history which is significant including morbid obesity and the comorbidities that are associated with this. I have also reviewed her past surgical history her medications and her allergies. Pulses are strongly palpable bilateral. Neurologic sensorium is intact. The tendon reflexes are intact. Muscle strength is intact. Orthopedic evaluation demonstrates near complete loss of range of motion of the ankle joint and of the subtalar joint left foot. She has very little frontal plane range of motion with exception of that at the level of Lisfranc's joints. Similarly dorsiflexion and plantarflexion. When evaluating the alignment of the foot she has a flatfoot with the ankle in the entire foot itself. In slight varus position. This is resulting in pressure along the fifth metatarsal base. No open lesions or wounds are noted. She does have a reactive hyperkeratotic lesion which we can call real certain that this point along the fifth metatarsal base of the left foot.  Radiographs taken today 3 views left foot demonstrate the normal osseous architecture from Lisfranc's joints  distally. However there is severe depression of the calcaneus and of the talus.. 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 There is significant spurring surrounding the joint.        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  Plan: At this point I will refer her to [facility] medical Department of orthopedics to Dr. Jaci Standard for evaluation and treatment. The referral was made today.

## 2016-03-01 ENCOUNTER — Telehealth: Payer: Self-pay | Admitting: *Deleted

## 2016-03-01 DIAGNOSIS — M779 Enthesopathy, unspecified: Secondary | ICD-10-CM

## 2016-03-01 NOTE — Telephone Encounter (Addendum)
Dr. Milinda Pointer referred pt to Dr. Pasty Spillers - [facility] [phone], fax [phone]. Referral, clinicals and demographics faxed to [facility]. Informed pt of [name] [phone]. I told pt I would mail the x-ray copy disc to her to hand carry to her appt. Pt states understanding. Mailed 02/29/2016 x-ray copy to pt.

## 2016-03-01 NOTE — Telephone Encounter (Signed)
Yes ma'am. I will take care of it. Do I need to notify the patient they are ready when I'm done?

## 2016-03-02 ENCOUNTER — Other Ambulatory Visit: Payer: Self-pay | Admitting: Internal Medicine

## 2016-03-21 ENCOUNTER — Encounter: Payer: Self-pay | Admitting: Internal Medicine

## 2016-03-22 DIAGNOSIS — M87072 Idiopathic aseptic necrosis of left ankle: Secondary | ICD-10-CM | POA: Insufficient documentation

## 2016-03-23 ENCOUNTER — Other Ambulatory Visit: Payer: Self-pay | Admitting: Internal Medicine

## 2016-04-13 ENCOUNTER — Other Ambulatory Visit: Payer: Self-pay | Admitting: Internal Medicine

## 2016-04-13 NOTE — Telephone Encounter (Signed)
Shirron has faxed back rx ro [facility]!

## 2016-04-13 NOTE — Telephone Encounter (Signed)
Done hardcopy to Shirron  

## 2016-04-19 ENCOUNTER — Other Ambulatory Visit: Payer: Self-pay | Admitting: Internal Medicine

## 2016-04-27 ENCOUNTER — Telehealth: Payer: Self-pay | Admitting: *Deleted

## 2016-04-27 NOTE — Telephone Encounter (Signed)
Received refill request for Meloxicam. Pt was referred to [facility], should be receiving treatment there. Return fax rx denied.

## 2016-05-16 ENCOUNTER — Other Ambulatory Visit: Payer: Self-pay | Admitting: Internal Medicine

## 2016-06-13 ENCOUNTER — Other Ambulatory Visit: Payer: Self-pay | Admitting: Internal Medicine

## 2016-07-12 ENCOUNTER — Other Ambulatory Visit: Payer: Self-pay | Admitting: Internal Medicine

## 2016-07-27 ENCOUNTER — Other Ambulatory Visit: Payer: Self-pay | Admitting: Internal Medicine

## 2016-07-29 ENCOUNTER — Other Ambulatory Visit: Payer: Self-pay | Admitting: Internal Medicine

## 2016-09-04 ENCOUNTER — Other Ambulatory Visit: Payer: Self-pay | Admitting: Internal Medicine

## 2016-09-05 ENCOUNTER — Other Ambulatory Visit: Payer: Self-pay | Admitting: Internal Medicine

## 2016-09-07 ENCOUNTER — Other Ambulatory Visit: Payer: Self-pay | Admitting: Internal Medicine

## 2016-09-08 ENCOUNTER — Other Ambulatory Visit: Payer: Self-pay | Admitting: Internal Medicine

## 2016-09-26 ENCOUNTER — Other Ambulatory Visit: Payer: Self-pay | Admitting: Internal Medicine

## 2016-09-27 NOTE — Telephone Encounter (Signed)
faxed

## 2016-09-27 NOTE — Telephone Encounter (Signed)
Done hardcopy to Shirron  

## 2016-11-30 ENCOUNTER — Other Ambulatory Visit: Payer: Self-pay | Admitting: Internal Medicine

## 2016-12-11 ENCOUNTER — Other Ambulatory Visit: Payer: Self-pay | Admitting: Internal Medicine

## 2016-12-11 ENCOUNTER — Encounter: Payer: Self-pay | Admitting: Internal Medicine

## 2016-12-18 ENCOUNTER — Ambulatory Visit: Payer: PRIVATE HEALTH INSURANCE | Admitting: Family

## 2016-12-18 ENCOUNTER — Encounter: Payer: Self-pay | Admitting: Family

## 2016-12-18 VITALS — BP 130/78 | HR 75 | Temp 97.6°F | Wt 392.0 lb

## 2016-12-18 DIAGNOSIS — M5416 Radiculopathy, lumbar region: Secondary | ICD-10-CM | POA: Diagnosis not present

## 2016-12-18 DIAGNOSIS — K219 Gastro-esophageal reflux disease without esophagitis: Secondary | ICD-10-CM

## 2016-12-18 MED ORDER — PREDNISONE 10 MG (21) PO TBPK: ORAL_TABLET | ORAL | 0 refills | Status: DC

## 2016-12-18 MED ORDER — CARISOPRODOL 350 MG PO TABS: 350.0000 mg | ORAL_TABLET | Freq: Two times a day (BID) | ORAL | 0 refills | Status: DC

## 2016-12-18 MED ORDER — OMEPRAZOLE 20 MG PO CPDR: 20.0000 mg | DELAYED_RELEASE_CAPSULE | Freq: Every day | ORAL | 0 refills | Status: DC

## 2016-12-18 NOTE — Progress Notes (Signed)
Jodi Keller is a 61 y.o. female with the following history as recorded in [employer]:  Patient Active Problem List   Diagnosis Date Noted  . Cough 01/27/2016  . Wheezing 01/27/2016  . Low back pain 01/27/2016  . Left rotator cuff tear 09/17/2015  . Left shoulder pain 09/03/2015  . Hidradenitis 06/16/2015  . Chest pain 06/16/2015  . Generalized anxiety disorder 11/17/2014  . Gait disorder 11/17/2014  . Intertrigo 11/17/2014  . Left knee pain 12/17/2013  . Left otitis media 07/04/2013  . Vertigo 06/26/2012  . Allergic rhinitis, cause unspecified 06/26/2012  . Eustachian tube dysfunction 06/26/2012  . Polycythemia 12/20/2011  . Peripheral edema 07/21/2011  . Impaired glucose tolerance 07/21/2011  . Hyperlipidemia 02/25/2011  . Right leg pain 02/24/2011  . Bilateral carpal tunnel syndrome 12/16/2010  . 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 Preventative health care 12/10/2010  . FATIGUE 10/30/2007  . Morbid obesity ([place]) 11/13/2006  . ANEMIA-IRON DEFICIENCY 11/13/2006  . Essential hypertension 11/13/2006  . GERD 11/13/2006    Current Outpatient Medications  Medication Sig Dispense Refill  . aspirin 81 MG chewable tablet Chew 1 tablet (81 mg total) by mouth daily. 30 tablet 0  . benazepril (LOTENSIN) 20 MG tablet TAKE 1 TABLET BY MOUTH EVERY DAY---INSURANCE ALLOWS 30 DAYS 90 tablet 1  . Calcium-Magnesium-Zinc 167-83-8 MG TABS Take 1 tablet by mouth daily.    . carisoprodol (SOMA) 350 MG tablet Take 1 tablet (350 mg total) by mouth 2 (two) times daily for 10 days. 20 tablet 0  . cholecalciferol (VITAMIN D) 1000 units tablet Take 1 tablet (1,000 Units total) by mouth daily as needed. Often forgets to take and doesn't know strength 30 tablet 3  . clotrimazole-betamethasone (LOTRISONE) cream USE AS DIRECTED TWICE DAILY AS NEEDED 15 g 4  . diclofenac (VOLTAREN) 75 MG EC tablet TAKE 1 TABLET TWICE A DAY AS NEEDED 60 tablet 5  . Diclofenac Sodium (PENNSAID) 2 % SOLN  Apply 1 pump twice daily. 112 g 3  . DULoxetine (CYMBALTA) 60 MG capsule TAKE 1 CAPSULE (60 MG TOTAL) BY MOUTH DAILY. 90 capsule 3  . hydrochlorothiazide (MICROZIDE) 12.5 MG capsule TAKE ONE CAPSULE BY MOUTH EVERY DAY 90 capsule 3  . HYDROcodone-acetaminophen (NORCO/VICODIN) 5-325 MG tablet Take 1 tablet by mouth every 6 (six) hours as needed for moderate pain. 40 tablet 0  . KLOR-CON 10 10 MEQ tablet     . Multiple Vitamins-Minerals (MULTIVITAMIN & MINERAL PO) Take 1 tablet by mouth daily.    [name] nystatin (MYCOSTATIN) powder Use as directed twice daily to affected area 30 g 2  . omeprazole (PRILOSEC) 20 MG capsule Take 1 capsule (20 mg total) by mouth daily. Overdue for annual appt w/labs must see MD for refills 30 capsule 0  . predniSONE (STERAPRED UNI-PAK 21 TAB) 10 MG (21) TBPK tablet Please taper as directed; 21 tablet 0   No current facility-administered medications for this visit.     Allergies: Patient has no known allergies.  Past Medical History:  Diagnosis Date  . Anemia   . Bilateral carpal tunnel syndrome 12/16/2010  . 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 Generalized anxiety disorder 11/17/2014  . GERD (gastroesophageal reflux disease)   . Hyperlipidemia 02/25/2011  . Hypertension   . Morbid obesity ([place])   . Uterine prolapse     Past Surgical History:  Procedure Laterality Date  . CESAREAN SECTION    . FOOT SURGERY  Family History  Problem Relation Age of Onset  . Cancer Father        Prostate  . Cancer Sister        Multiple Myeloma  . Diabetes Maternal Grandmother   . Cancer Sister        Breast  . Thyroid disease Other        Multiple    Social History   Tobacco Use  . Smoking status: Never Smoker  . Smokeless tobacco: Never Used  Substance Use Topics  . Alcohol use: Yes    Comment: rare    Subjective:  Patient presents with concerns about sudden onset of left hip/ low back pain. She states the symptoms started suddenly  yesterday. She notes the pain is radiating into her left upper thigh. She denies any known injury or trauma. She is able to drive but notes she cannot stand for long periods of time due to the pain. The symptoms are more pronounced when she has to change positions. She has tried taking her prescriptive Diclofenac with no benefit.   She is also requesting a refill on her Prilosec for her reflux symptoms.    Objective:  Vitals:   12/18/16 1117  BP: 130/78  Pulse: 75  Temp: 97.6 F (36.4 C)  SpO2: 99%  Weight: (!) 392 lb (177.8 kg)    General: Well developed, well nourished, in no acute distress  Skin : Warm and dry.  Head: Normocephalic and atraumatic  Lungs: Respirations unlabored; clear to auscultation bilaterally without wheeze, rales, rhonchi  CVS exam: normal rate, regular rhythm, normal S1, S2, no murmurs, rubs, clicks or gallops.  Musculoskeletal: No deformities; no active joint inflammation  Extremities: No edema, cyanosis, clubbing  Vessels: Symmetric bilaterally  Neurologic: Alert and oriented; speech intact; face symmetrical; moves all extremities well; CNII-XII intact without focal deficit   Assessment:   1. Lumbar back pain with radiculopathy affecting left lower extremity   2. Gastroesophageal reflux disease, esophagitis presence not specified     Plan:  1. Suspect sciatica; discussed imaging with patient today- she defers at this time as she feels this is related to complications from a chronic foot issue; agree this is reasonable but patient agrees to imaging if symptoms persist/ recur; Rx for Prednisone Dose pak- take as directed and short-term refill given on her Soma; follow-up worse, no better.  2. GERD- Refill given on Prilosec as requested; she understands to schedule CPE with her PCP, Dr. Jenny Keller for continued refills.   No Follow-up on file.  No orders of the defined types were placed in this encounter.   Requested Prescriptions   Signed Prescriptions Disp  Refills  . predniSONE (STERAPRED UNI-PAK 21 TAB) 10 MG (21) TBPK tablet 21 tablet 0    Sig: Please taper as directed;  . omeprazole (PRILOSEC) 20 MG capsule 30 capsule 0    Sig: Take 1 capsule (20 mg total) by mouth daily. Overdue for annual appt w/labs must see MD for refills  . carisoprodol (SOMA) 350 MG tablet 20 tablet 0    Sig: Take 1 tablet (350 mg total) by mouth 2 (two) times daily for 10 days.

## 2016-12-28 ENCOUNTER — Encounter: Payer: Self-pay | Admitting: Internal Medicine

## 2016-12-28 MED ORDER — CARISOPRODOL 350 MG PO TABS
350.0000 mg | ORAL_TABLET | Freq: Two times a day (BID) | ORAL | 0 refills | Status: DC
Start: 2016-12-28 — End: 2017-01-31

## 2017-01-25 ENCOUNTER — Encounter: Payer: PRIVATE HEALTH INSURANCE | Admitting: Internal Medicine

## 2017-01-31 ENCOUNTER — Other Ambulatory Visit (INDEPENDENT_AMBULATORY_CARE_PROVIDER_SITE_OTHER): Payer: PRIVATE HEALTH INSURANCE

## 2017-01-31 ENCOUNTER — Encounter: Payer: PRIVATE HEALTH INSURANCE | Admitting: Internal Medicine

## 2017-01-31 ENCOUNTER — Ambulatory Visit (INDEPENDENT_AMBULATORY_CARE_PROVIDER_SITE_OTHER): Payer: PRIVATE HEALTH INSURANCE | Admitting: Internal Medicine

## 2017-01-31 ENCOUNTER — Encounter: Payer: Self-pay | Admitting: Internal Medicine

## 2017-01-31 VITALS — BP 126/84 | HR 91 | Temp 97.7°F | Ht 67.0 in | Wt >= 6400 oz

## 2017-01-31 DIAGNOSIS — L0390 Cellulitis, unspecified: Secondary | ICD-10-CM

## 2017-01-31 DIAGNOSIS — Z0001 Encounter for general adult medical examination with abnormal findings: Secondary | ICD-10-CM

## 2017-01-31 DIAGNOSIS — Z0000 Encounter for general adult medical examination without abnormal findings: Secondary | ICD-10-CM

## 2017-01-31 DIAGNOSIS — S46219A Strain of muscle, fascia and tendon of other parts of biceps, unspecified arm, initial encounter: Secondary | ICD-10-CM | POA: Insufficient documentation

## 2017-01-31 DIAGNOSIS — R7302 Impaired glucose tolerance (oral): Secondary | ICD-10-CM

## 2017-01-31 DIAGNOSIS — Z114 Encounter for screening for human immunodeficiency virus [HIV]: Secondary | ICD-10-CM

## 2017-01-31 DIAGNOSIS — S46211A Strain of muscle, fascia and tendon of other parts of biceps, right arm, initial encounter: Secondary | ICD-10-CM | POA: Diagnosis not present

## 2017-01-31 LAB — BASIC METABOLIC PANEL
BUN: 18 mg/dL (ref 6–23)
CO2: 28 mEq/L (ref 19–32)
CREATININE: 0.98 mg/dL (ref 0.40–1.20)
Calcium: 9.4 mg/dL (ref 8.4–10.5)
Chloride: 101 mEq/L (ref 96–112)
GFR: 61.21 mL/min (ref >60.00)
GLUCOSE: 115 mg/dL — AB (ref 70–99)
POTASSIUM: 4.1 meq/L (ref 3.5–5.1)
Sodium: 138 mEq/L (ref 135–145)

## 2017-01-31 LAB — LIPID PANEL
CHOLESTEROL: 183 mg/dL (ref 0–200)
HDL: 48.7 mg/dL (ref >39.00)
LDL Cholesterol: 106 mg/dL — ABNORMAL HIGH (ref 0–99)
NONHDL: 133.97
Total CHOL/HDL Ratio: 4
Triglycerides: 141 mg/dL (ref 0.0–149.0)
VLDL: 28.2 mg/dL (ref 0.0–40.0)

## 2017-01-31 LAB — CBC WITH DIFFERENTIAL/PLATELET
BASOS ABS: 0.1 10*3/uL (ref 0.0–0.1)
Basophils Relative: 0.7 % (ref 0.0–3.0)
EOS ABS: 0.3 10*3/uL (ref 0.0–0.7)
Eosinophils Relative: 2.8 % (ref 0.0–5.0)
HEMATOCRIT: 47.4 % — AB (ref 36.0–46.0)
HEMOGLOBIN: 15.5 g/dL — AB (ref 12.0–15.0)
LYMPHS PCT: 25.5 % (ref 12.0–46.0)
Lymphs Abs: 2.4 10*3/uL (ref 0.7–4.0)
MCHC: 32.8 g/dL (ref 30.0–36.0)
MCV: 94.3 fl (ref 78.0–100.0)
MONO ABS: 0.7 10*3/uL (ref 0.1–1.0)
Monocytes Relative: 7.7 % (ref 3.0–12.0)
Neutro Abs: 5.9 10*3/uL (ref 1.4–7.7)
Neutrophils Relative %: 63.3 % (ref 43.0–77.0)
Platelets: 333 10*3/uL (ref 150.0–400.0)
RBC: 5.03 Mil/uL (ref 3.87–5.11)
RDW: 15.2 % (ref 11.5–15.5)
WBC: 9.3 10*3/uL (ref 4.0–10.5)

## 2017-01-31 LAB — HEPATIC FUNCTION PANEL
ALK PHOS: 87 U/L (ref 39–117)
ALT: 26 U/L (ref 0–35)
AST: 22 U/L (ref 0–37)
Albumin: 4 g/dL (ref 3.5–5.2)
BILIRUBIN DIRECT: 0.2 mg/dL (ref 0.0–0.3)
BILIRUBIN TOTAL: 0.8 mg/dL (ref 0.2–1.2)
TOTAL PROTEIN: 7.3 g/dL (ref 6.0–8.3)

## 2017-01-31 LAB — TSH: TSH: 4.65 u[IU]/mL — AB (ref 0.35–4.50)

## 2017-01-31 LAB — HEMOGLOBIN A1C: HEMOGLOBIN A1C: 6.7 % — AB (ref 4.6–6.5)

## 2017-01-31 MED ORDER — OMEPRAZOLE 20 MG PO CPDR: 20.0000 mg | DELAYED_RELEASE_CAPSULE | Freq: Every day | ORAL | 0 refills | Status: DC

## 2017-01-31 MED ORDER — CARISOPRODOL 350 MG PO TABS: 350.0000 mg | ORAL_TABLET | Freq: Two times a day (BID) | ORAL | 2 refills | Status: DC

## 2017-01-31 MED ORDER — DOXYCYCLINE HYCLATE 100 MG PO TABS
100.0000 mg | ORAL_TABLET | Freq: Two times a day (BID) | ORAL | 0 refills | Status: DC
Start: 2017-01-31 — End: 2018-06-10

## 2017-01-31 NOTE — Patient Instructions (Addendum)
Please take all new medication as prescribed - the antibiotic  Please call in 1 week if clearly not improving, for general surgury referral  Please call if you are able to have the colonoscopy after contact insurance  Please continue all other medications as before, and refills have been done if requested.  Please have the pharmacy call with any other refills you may need.  Please continue your efforts at being more active, low cholesterol diet, and weight control.  You are otherwise up to date with prevention measures today.  Please keep your appointments with your specialists as you may have planned  You will be contacted regarding the referral for: orthopedic  Please go to the LAB in the Basement (turn left off the elevator) for the tests to be done today  You will be contacted by phone if any changes need to be made immediately.  Otherwise, you will receive a letter about your results with an explanation, but please check with MyChart first.  Please remember to sign up for MyChart if you have not done so, as this will be important to you in the future with finding out test results, communicating by private email, and scheduling acute appointments online when needed.  Please return in 1 year for your yearly visit, or sooner if needed, with Lab testing done 3-5 days before

## 2017-01-31 NOTE — Progress Notes (Signed)
Subjective:    Patient ID: Jodi Keller, female    DOB: 22-Dec-1955, 62 y.o.   MRN: [id number]  HPI  Here for wellness and f/u;  Overall doing ok;  Pt denies Chest pain, worsening SOB, DOE, wheezing, orthopnea, PND, worsening LE edema, palpitations, dizziness or syncope.  Pt denies neurological change such as new headache, facial or extremity weakness.  Pt denies polydipsia, polyuria, or low sugar symptoms. Pt states overall good compliance with treatment and medications, good tolerability, and has been trying to follow appropriate diet.  Pt denies worsening depressive symptoms, suicidal ideation or panic. No fever, night sweats, wt loss, loss of appetite, or other constitutional symptoms.  Pt states good ability with ADL's, has low fall risk, home safety reviewed and adequate, no other significant changes in hearing or vision, and only occasionally active with exercise.  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   Also has incidental 2 days mod to severe pain to right bicep with a pop trying to lift an object, seems to be weaker arm now, nothing else makes better or worse Also with 3 days onset incidental right medial mid breast with mild to mod macerated area and surrounding erythema, today becoming more painful and swollen, without fever, chills or drainage Past Medical History:  Diagnosis Date  . Anemia   . Bilateral carpal tunnel syndrome 12/16/2010  . 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 Generalized anxiety disorder 11/17/2014  . GERD (gastroesophageal reflux disease)   . Hyperlipidemia 02/25/2011  . Hypertension   . Morbid obesity ([place])   . Uterine prolapse    Past Surgical History:  Procedure Laterality Date  . CESAREAN SECTION    . FOOT SURGERY      reports that  has never smoked. she has never used smokeless tobacco. She reports that she drinks alcohol. She reports that she does not use drugs. family history includes Cancer in her father,  sister, and sister; Diabetes in her maternal grandmother; Thyroid disease in her other. No Known Allergies Current Outpatient Medications on File Prior to Visit  Medication Sig Dispense Refill  . aspirin 81 MG chewable tablet Chew 1 tablet (81 mg total) by mouth daily. 30 tablet 0  . benazepril (LOTENSIN) 20 MG tablet TAKE 1 TABLET BY MOUTH EVERY DAY---INSURANCE ALLOWS 30 DAYS 90 tablet 1  . Calcium-Magnesium-Zinc 167-83-8 MG TABS Take 1 tablet by mouth daily.    . cholecalciferol (VITAMIN D) 1000 units tablet Take 1 tablet (1,000 Units total) by mouth daily as needed. Often forgets to take and doesn't know strength 30 tablet 3  . clotrimazole-betamethasone (LOTRISONE) cream USE AS DIRECTED TWICE DAILY AS NEEDED 15 g 4  . diclofenac (VOLTAREN) 75 MG EC tablet TAKE 1 TABLET TWICE A DAY AS NEEDED 60 tablet 5  . Diclofenac Sodium (PENNSAID) 2 % SOLN Apply 1 pump twice daily. 112 g 3  . DULoxetine (CYMBALTA) 60 MG capsule TAKE 1 CAPSULE (60 MG TOTAL) BY MOUTH DAILY. 90 capsule 3  . hydrochlorothiazide (MICROZIDE) 12.5 MG capsule TAKE ONE CAPSULE BY MOUTH EVERY DAY 90 capsule 3  . HYDROcodone-acetaminophen (NORCO/VICODIN) 5-325 MG tablet Take 1 tablet by mouth every 6 (six) hours as needed for moderate pain. 40 tablet 0  . KLOR-CON 10 10 MEQ tablet     . Multiple Vitamins-Minerals (MULTIVITAMIN & MINERAL PO) Take 1 tablet by mouth daily.    [name] nystatin (MYCOSTATIN) powder Use as directed twice daily to affected area 30 g  2  . predniSONE (STERAPRED UNI-PAK 21 TAB) 10 MG (21) TBPK tablet Please taper as directed; 21 tablet 0   No current facility-administered medications on file prior to visit.    Review of Systems Constitutional: Negative for other unusual diaphoresis, sweats, appetite or weight changes HENT: Negative for other worsening hearing loss, ear pain, facial swelling, mouth sores or neck stiffness.   Eyes: Negative for other worsening pain, redness or other visual disturbance.    Respiratory: Negative for other stridor or swelling Cardiovascular: Negative for other palpitations or other chest pain  Gastrointestinal: Negative for worsening diarrhea or loose stools, blood in stool, distention or other pain Genitourinary: Negative for hematuria, flank pain or other change in urine volume.  Musculoskeletal: Negative for myalgias or other joint swelling.  Skin: Negative for other color change, or other wound or worsening drainage.  Neurological: Negative for other syncope or numbness. Hematological: Negative for other adenopathy or swelling Psychiatric/Behavioral: Negative for hallucinations, other worsening agitation, SI, self-injury, or new decreased concentration All other system neg per pt    Objective:   Physical Exam BP 126/84   Pulse 91   Temp 97.7 F (36.5 C) (Oral)   Ht 5\' 7"  (1.702 m)   Wt (!) 401 lb (181.9 kg)   LMP 01/12/2011   SpO2 99%   BMI 62.81 kg/m  \VS noted,  Constitutional: Pt is oriented to person, place, and time. Appears well-developed and well-nourished, in no significant distress and comfortable Head: Normocephalic and atraumatic  Eyes: Conjunctivae and EOM are normal. Pupils are equal, round, and reactive to light Right Ear: External ear normal without discharge Left Ear: External ear normal without discharge Nose: Nose without discharge or deformity Mouth/Throat: Oropharynx is without other ulcerations and moist  Neck: Normal range of motion. Neck supple. No JVD present. No tracheal deviation present or significant neck LA or mass Cardiovascular: Normal rate, regular rhythm, normal heart sounds and intact distal pulses.   Pulmonary/Chest: WOB normal and breath sounds without rales or wheezing  Abdominal: Soft. Bowel sounds are normal. NT. No HSM  Musculoskeletal: Normal range of motion. Exhibits no edema, right bicep contracted it seems Right medial mid breast with 3 cm area red, tender, swelling without fluctuance or drainage but  with central very superficial macerated area Lymphadenopathy: Has no other cervical adenopathy.  Neurological: Pt is alert and oriented to person, place, and time. Pt has normal reflexes. No cranial nerve deficit. Motor grossly intact, Gait intact Skin: Skin is warm and dry. No rash noted or new ulcerations Psychiatric:  Has normal mood and affect. Behavior is normal without agitation No other exam findings       Assessment & Plan:

## 2017-02-01 ENCOUNTER — Encounter: Payer: Self-pay | Admitting: Internal Medicine

## 2017-02-01 DIAGNOSIS — L0390 Cellulitis, unspecified: Secondary | ICD-10-CM | POA: Insufficient documentation

## 2017-02-01 LAB — URINALYSIS, ROUTINE W REFLEX MICROSCOPIC
BILIRUBIN URINE: NEGATIVE
HGB URINE DIPSTICK: NEGATIVE
LEUKOCYTES UA: NEGATIVE
Nitrite: NEGATIVE
RBC / HPF: NONE SEEN (ref 0–?)
Specific Gravity, Urine: 1.02 (ref 1.000–1.030)
Total Protein, Urine: NEGATIVE
UROBILINOGEN UA: 0.2 (ref 0.0–1.0)
Urine Glucose: NEGATIVE
WBC UA: NONE SEEN (ref 0–?)
pH: 6.5 (ref 5.0–8.0)

## 2017-02-01 LAB — HIV ANTIBODY (ROUTINE TESTING W REFLEX): HIV 1&2 Ab, 4th Generation: NONREACTIVE

## 2017-02-01 NOTE — Assessment & Plan Note (Signed)
Right medial mid breast  - Mild to mod, for antibx course,  to f/u any worsening symptoms or concerns, consider general surgury r/o breast ca if not improved 3-5 days

## 2017-02-01 NOTE — Assessment & Plan Note (Signed)

## 2017-02-01 NOTE — Assessment & Plan Note (Addendum)
Possible, for ortho referral for further eval and tx  In addition to the time spent performing CPE, I spent an additional 25 minutes face to face,in which greater than 50% of this time was spent in counseling and coordination of care for patient's acute illness as documented, including the differential dx, treatment, further evaluation and other management of bicep tear, cellulitis, and hyperglycemia

## 2017-02-01 NOTE — Assessment & Plan Note (Signed)
stable overall by history and exam, recent data reviewed with pt, and pt to continue medical treatment as before,  to f/u any worsening symptoms or concerns Lab Results  Component Value Date   HGBA1C 6.7 (H) 01/31/2017

## 2017-02-02 ENCOUNTER — Other Ambulatory Visit: Payer: Self-pay | Admitting: Internal Medicine

## 2017-02-27 ENCOUNTER — Other Ambulatory Visit: Payer: Self-pay | Admitting: Internal Medicine

## 2017-03-07 ENCOUNTER — Other Ambulatory Visit: Payer: Self-pay | Admitting: Internal Medicine

## 2017-05-31 ENCOUNTER — Other Ambulatory Visit: Payer: Self-pay | Admitting: Internal Medicine

## 2017-06-06 ENCOUNTER — Other Ambulatory Visit: Payer: Self-pay | Admitting: Internal Medicine

## 2017-07-06 ENCOUNTER — Other Ambulatory Visit: Payer: Self-pay | Admitting: Internal Medicine

## 2017-09-17 IMAGING — CR DG CHEST 2V
2 series · 2 of 2 positions shown · non-contrast
Comparison: Chest radiograph dated 09/08/2011

CLINICAL DATA: 59-year-old female with chest pain

EXAM:
CHEST  2 VIEW

[w chest pa]
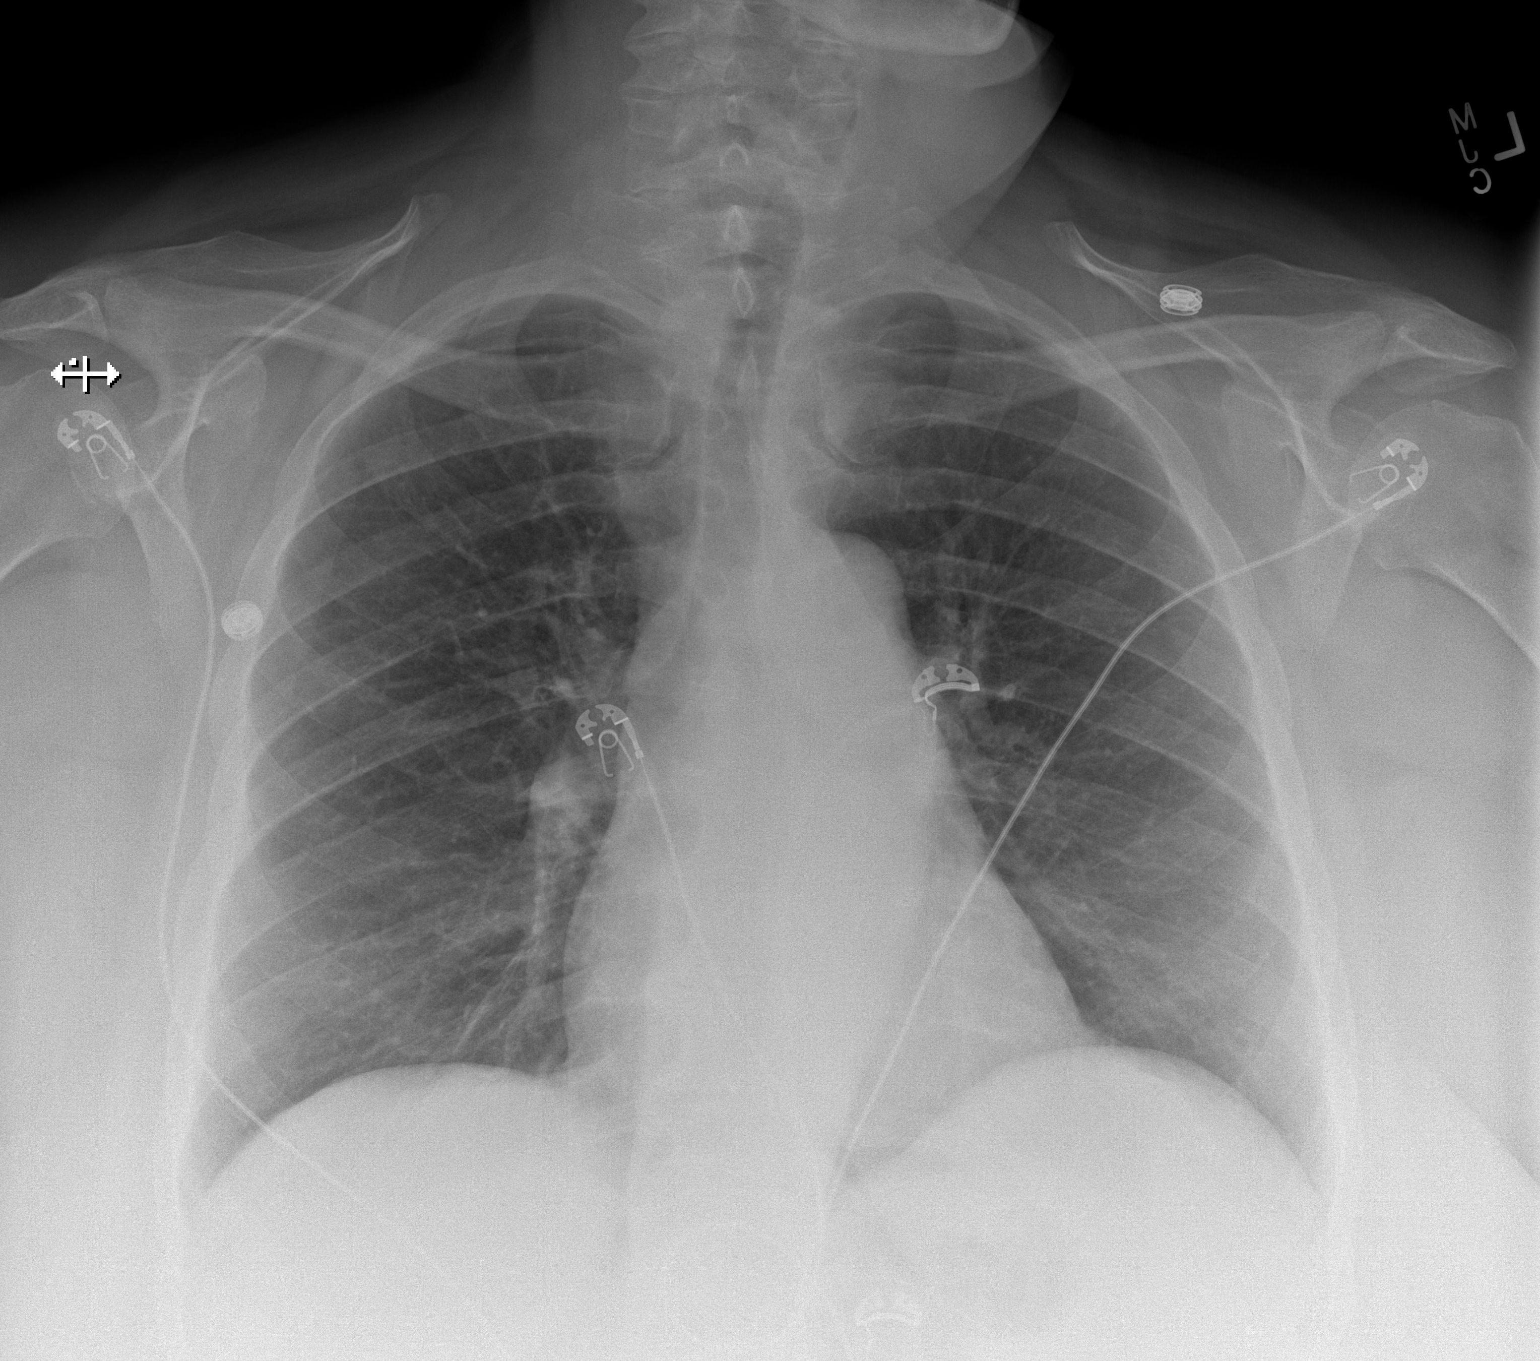

[w chest lat]
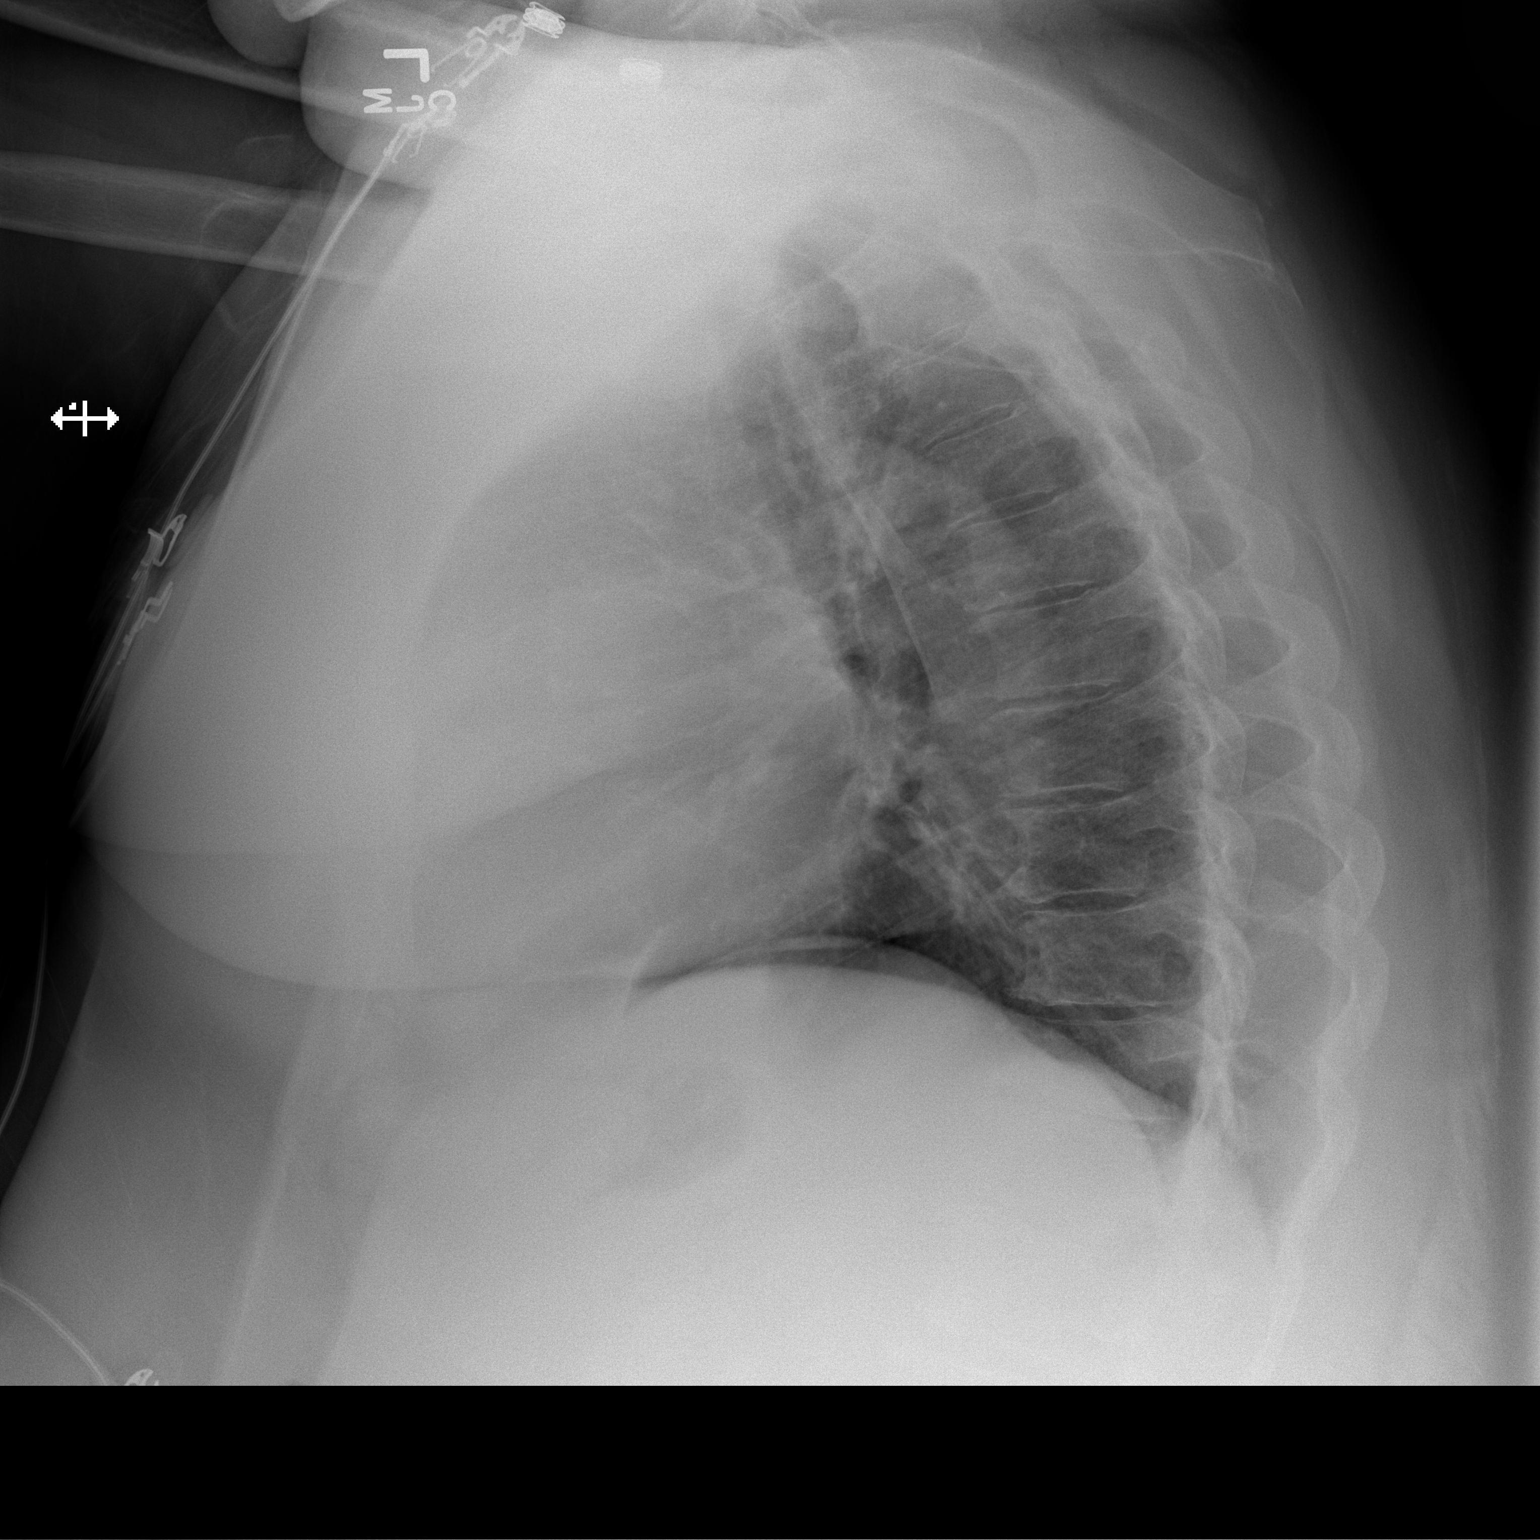

[2 of 2 positions shown; findings below may reference images not displayed]

FINDINGS: The heart size and mediastinal contours are within normal limits.
Both lungs are clear. The visualized skeletal structures are
unremarkable.
IMPRESSION: No active cardiopulmonary disease.

## 2017-11-07 ENCOUNTER — Telehealth: Payer: Self-pay | Admitting: Podiatry

## 2017-11-07 NOTE — Telephone Encounter (Signed)
I was calling to see if any of your doctors does ankle reconstructive surgery? I called last week and nobody called me back. My number is [phone]. Thank you.

## 2017-11-09 NOTE — Telephone Encounter (Signed)
See if Dr. Amalia Hailey would like to see her

## 2017-11-09 NOTE — Telephone Encounter (Signed)
I informed pt I would contact her once our Dr. Amalia Hailey had reviewed the clinicals.

## 2017-11-10 ENCOUNTER — Other Ambulatory Visit: Payer: Self-pay | Admitting: Internal Medicine

## 2017-11-12 NOTE — Telephone Encounter (Signed)
   LOV:01/31/17 NextOV:not scheduled Last Filled/Quantity: 07/01/17 60#

## 2017-11-14 NOTE — Telephone Encounter (Signed)
I informed pt Dr. Amalia Hailey had stated she had been referred to [facility] - Dr. Jaci Standard, last year, and would prefer she follow up with them. Dr. Amalia Hailey states she requires a femoral head autograft and that is beyond our scope of practice. Pt states understanding.

## 2017-11-22 ENCOUNTER — Other Ambulatory Visit: Payer: Self-pay | Admitting: Internal Medicine

## 2017-11-30 ENCOUNTER — Other Ambulatory Visit: Payer: Self-pay | Admitting: Internal Medicine

## 2018-02-22 ENCOUNTER — Other Ambulatory Visit: Payer: Self-pay | Admitting: Internal Medicine

## 2018-02-23 ENCOUNTER — Other Ambulatory Visit: Payer: Self-pay | Admitting: Internal Medicine

## 2018-03-08 ENCOUNTER — Other Ambulatory Visit: Payer: Self-pay | Admitting: Internal Medicine

## 2018-03-18 ENCOUNTER — Other Ambulatory Visit: Payer: Self-pay | Admitting: Internal Medicine

## 2018-04-01 ENCOUNTER — Other Ambulatory Visit: Payer: Self-pay | Admitting: Internal Medicine

## 2018-04-15 ENCOUNTER — Encounter: Payer: Self-pay | Admitting: Internal Medicine

## 2018-04-15 MED ORDER — BENAZEPRIL HCL 20 MG PO TABS: 20.0000 mg | ORAL_TABLET | Freq: Every day | ORAL | 0 refills | Status: DC

## 2018-04-15 MED ORDER — OMEPRAZOLE 20 MG PO CPDR: 20.0000 mg | DELAYED_RELEASE_CAPSULE | Freq: Every day | ORAL | 0 refills | Status: DC

## 2018-04-17 MED ORDER — CARISOPRODOL 350 MG PO TABS: 350.0000 mg | ORAL_TABLET | Freq: Two times a day (BID) | ORAL | 1 refills | Status: DC

## 2018-04-17 NOTE — Addendum Note (Signed)
Addended by: Biagio Borg on: 04/17/2018 03:12 PM   Modules accepted: Orders

## 2018-05-18 ENCOUNTER — Other Ambulatory Visit: Payer: Self-pay | Admitting: Internal Medicine

## 2018-06-10 ENCOUNTER — Ambulatory Visit (INDEPENDENT_AMBULATORY_CARE_PROVIDER_SITE_OTHER): Payer: BLUE CROSS/BLUE SHIELD | Admitting: Internal Medicine

## 2018-06-10 ENCOUNTER — Encounter: Payer: Self-pay | Admitting: Internal Medicine

## 2018-06-10 ENCOUNTER — Other Ambulatory Visit: Payer: Self-pay | Admitting: *Deleted

## 2018-06-10 ENCOUNTER — Other Ambulatory Visit: Payer: Self-pay | Admitting: Internal Medicine

## 2018-06-10 DIAGNOSIS — Z0001 Encounter for general adult medical examination with abnormal findings: Secondary | ICD-10-CM

## 2018-06-10 DIAGNOSIS — R7302 Impaired glucose tolerance (oral): Secondary | ICD-10-CM | POA: Diagnosis not present

## 2018-06-10 DIAGNOSIS — F411 Generalized anxiety disorder: Secondary | ICD-10-CM

## 2018-06-10 DIAGNOSIS — Z0000 Encounter for general adult medical examination without abnormal findings: Secondary | ICD-10-CM

## 2018-06-10 MED ORDER — OMEPRAZOLE 20 MG PO CPDR: 20.0000 mg | DELAYED_RELEASE_CAPSULE | Freq: Every day | ORAL | 3 refills | Status: DC

## 2018-06-10 MED ORDER — DULOXETINE HCL 60 MG PO CPEP: ORAL_CAPSULE | ORAL | 3 refills | Status: DC

## 2018-06-10 MED ORDER — DULOXETINE HCL 60 MG PO CPEP: ORAL_CAPSULE | ORAL | 0 refills | Status: DC

## 2018-06-10 MED ORDER — CARISOPRODOL 350 MG PO TABS: 350.0000 mg | ORAL_TABLET | Freq: Two times a day (BID) | ORAL | 1 refills | Status: DC

## 2018-06-10 MED ORDER — CLOTRIMAZOLE-BETAMETHASONE 1-0.05 % EX CREA: TOPICAL_CREAM | CUTANEOUS | 3 refills | Status: DC

## 2018-06-10 MED ORDER — BENAZEPRIL HCL 20 MG PO TABS: 20.0000 mg | ORAL_TABLET | Freq: Every day | ORAL | 3 refills | Status: DC

## 2018-06-10 MED ORDER — HYDROCHLOROTHIAZIDE 12.5 MG PO CAPS: ORAL_CAPSULE | ORAL | 3 refills | Status: DC

## 2018-06-10 MED ORDER — DICLOFENAC SODIUM 75 MG PO TBEC: 75.0000 mg | DELAYED_RELEASE_TABLET | Freq: Two times a day (BID) | ORAL | 1 refills | Status: DC | PRN

## 2018-06-10 NOTE — Assessment & Plan Note (Signed)
Stable, to continue duloxetine

## 2018-06-10 NOTE — Patient Instructions (Signed)

## 2018-06-10 NOTE — Assessment & Plan Note (Signed)

## 2018-06-10 NOTE — Telephone Encounter (Signed)
Staff to please assist with pt making either Virtual or In person visit (even today)

## 2018-06-10 NOTE — Progress Notes (Signed)
Patient ID: Jodi Keller, female   DOB: 1955-09-25, 63 y.o.   MRN: [id number]  Virtual Visit via Video Note  I connected with [name] on 06/10/18 at  7:00 PM EDT by a video enabled telemedicine application and verified that I am speaking with the correct person using two identifiers.  Location: Patient: at home Provider: at office   I discussed the limitations of evaluation and management by telemedicine and the availability of in person appointments. The patient expressed understanding and agreed to proceed.  History of Present Illness: Here for wellness and f/u;  Overall doing ok;  Pt denies Chest pain, worsening SOB, DOE, wheezing, orthopnea, PND, worsening LE edema, palpitations, dizziness or syncope.  Pt denies neurological change such as new headache, facial or extremity weakness.  Pt denies polydipsia, polyuria, or low sugar symptoms. Pt states overall good compliance with treatment and medications, good tolerability, and has been trying to follow appropriate diet.  Pt denies worsening depressive symptoms, suicidal ideation or panic. No fever, night sweats, wt loss, loss of appetite, or other constitutional symptoms.  Pt states good ability with ADL's, has low fall risk, home safety reviewed and adequate, no other significant changes in hearing or vision, and only occasionally active with exercise. Asks for refills, just lost job and looking for new one and would like to continue the duloxetine.  Has ongoing bilat ankle AVN and pain some improved but balanace and ambualation are difficult.  Also has new onset urinary incontinence at night occasionally, wears pads  Denies urinary symptoms such as dysuria, frequency, urgency, flank pain, hematuria or n/v, fever, chills. Past Medical History:  Diagnosis Date  . Anemia   . Bilateral carpal tunnel syndrome 12/16/2010  . 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 Generalized anxiety disorder 11/17/2014  . GERD  (gastroesophageal reflux disease)   . Hyperlipidemia 02/25/2011  . Hypertension   . Morbid obesity ([place])   . Uterine prolapse    Past Surgical History:  Procedure Laterality Date  . CESAREAN SECTION    . FOOT SURGERY      reports that she has never smoked. She has never used smokeless tobacco. She reports current alcohol use. She reports that she does not use drugs. family history includes Cancer in her father, sister, and sister; Diabetes in her maternal grandmother; Thyroid disease in an other family member. No Known Allergies Current Outpatient Medications on File Prior to Visit  Medication Sig Dispense Refill  . aspirin 81 MG chewable tablet Chew 1 tablet (81 mg total) by mouth daily. 30 tablet 0  . Calcium-Magnesium-Zinc 167-83-8 MG TABS Take 1 tablet by mouth daily.    . cholecalciferol (VITAMIN D) 1000 units tablet Take 1 tablet (1,000 Units total) by mouth daily as needed. Often forgets to take and doesn't know strength 30 tablet 3  . Diclofenac Sodium (PENNSAID) 2 % SOLN Apply 1 pump twice daily. 112 g 3  . Multiple Vitamins-Minerals (MULTIVITAMIN & MINERAL PO) Take 1 tablet by mouth daily.     No current facility-administered medications on file prior to visit.     Observations/Objective: Alert, NAD, appropriate mood and affect, resps normal, cn 2-12 intact, moves all 4s, no visible rash or swelling Lab Results  Component Value Date   WBC 9.3 01/31/2017   HGB 15.5 (H) 01/31/2017   HCT 47.4 (H) 01/31/2017   PLT 333.0 01/31/2017   GLUCOSE 115 (H) 01/31/2017   CHOL 183 01/31/2017   TRIG 141.0 01/31/2017  HDL 48.70 01/31/2017   LDLCALC 106 (H) 01/31/2017   ALT 26 01/31/2017   AST 22 01/31/2017   NA 138 01/31/2017   K 4.1 01/31/2017   CL 101 01/31/2017   CREATININE 0.98 01/31/2017   BUN 18 01/31/2017   CO2 28 01/31/2017   TSH 4.65 (H) 01/31/2017   HGBA1C 6.7 (H) 01/31/2017   Assessment and Plan: See notes  Follow Up Instructions: See notes   I discussed the  assessment and treatment plan with the patient. The patient was provided an opportunity to ask questions and all were answered. The patient agreed with the plan and demonstrated an understanding of the instructions.   The patient was advised to call back or seek an in-person evaluation if the symptoms worsen or if the condition fails to improve as anticipated.  Cathlean Cower, MD

## 2018-06-10 NOTE — Telephone Encounter (Signed)
Called pt gave MD response. Made appt for late this afternoon @ 7pm../lmb

## 2018-06-10 NOTE — Assessment & Plan Note (Signed)
stable overall by history and exam, recent data reviewed with pt, and pt to continue medical treatment as before,  to f/u any worsening symptoms or concerns  

## 2018-06-13 ENCOUNTER — Other Ambulatory Visit (INDEPENDENT_AMBULATORY_CARE_PROVIDER_SITE_OTHER): Payer: BLUE CROSS/BLUE SHIELD

## 2018-06-13 DIAGNOSIS — R7302 Impaired glucose tolerance (oral): Secondary | ICD-10-CM

## 2018-06-13 DIAGNOSIS — Z0001 Encounter for general adult medical examination with abnormal findings: Secondary | ICD-10-CM

## 2018-06-13 LAB — LIPID PANEL
Cholesterol: 192 mg/dL (ref 0–200)
HDL: 48.3 mg/dL (ref >39.00)
LDL Cholesterol: 116 mg/dL — ABNORMAL HIGH (ref 0–99)
NonHDL: 143.75
Total CHOL/HDL Ratio: 4
Triglycerides: 139 mg/dL (ref 0.0–149.0)
VLDL: 27.8 mg/dL (ref 0.0–40.0)

## 2018-06-13 LAB — HEMOGLOBIN A1C: Hgb A1c MFr Bld: 6.8 % — ABNORMAL HIGH (ref 4.6–6.5)

## 2018-06-13 LAB — BASIC METABOLIC PANEL
BUN: 16 mg/dL (ref 6–23)
CO2: 26 mEq/L (ref 19–32)
Calcium: 9.4 mg/dL (ref 8.4–10.5)
Chloride: 102 mEq/L (ref 96–112)
Creatinine, Ser: 1.04 mg/dL (ref 0.40–1.20)
GFR: 53.53 mL/min — ABNORMAL LOW (ref >60.00)
Glucose, Bld: 154 mg/dL — ABNORMAL HIGH (ref 70–99)
Potassium: 4.1 mEq/L (ref 3.5–5.1)
Sodium: 139 mEq/L (ref 135–145)

## 2018-06-13 LAB — HEPATIC FUNCTION PANEL
ALT: 37 U/L — ABNORMAL HIGH (ref 0–35)
AST: 45 U/L — ABNORMAL HIGH (ref 0–37)
Albumin: 3.7 g/dL (ref 3.5–5.2)
Alkaline Phosphatase: 93 U/L (ref 39–117)
Bilirubin, Direct: 0.2 mg/dL (ref 0.0–0.3)
Total Bilirubin: 1 mg/dL (ref 0.2–1.2)
Total Protein: 7.4 g/dL (ref 6.0–8.3)

## 2018-06-13 LAB — CBC WITH DIFFERENTIAL/PLATELET
Basophils Absolute: 0.1 10*3/uL (ref 0.0–0.1)
Basophils Relative: 0.8 % (ref 0.0–3.0)
Eosinophils Absolute: 0.3 10*3/uL (ref 0.0–0.7)
Eosinophils Relative: 3.7 % (ref 0.0–5.0)
HCT: 45.8 % (ref 36.0–46.0)
Hemoglobin: 15.6 g/dL — ABNORMAL HIGH (ref 12.0–15.0)
Lymphocytes Relative: 25.4 % (ref 12.0–46.0)
Lymphs Abs: 1.8 10*3/uL (ref 0.7–4.0)
MCHC: 34 g/dL (ref 30.0–36.0)
MCV: 92 fl (ref 78.0–100.0)
Monocytes Absolute: 0.6 10*3/uL (ref 0.1–1.0)
Monocytes Relative: 8.5 % (ref 3.0–12.0)
Neutro Abs: 4.3 10*3/uL (ref 1.4–7.7)
Neutrophils Relative %: 61.6 % (ref 43.0–77.0)
Platelets: 300 10*3/uL (ref 150.0–400.0)
RBC: 4.98 Mil/uL (ref 3.87–5.11)
RDW: 14.6 % (ref 11.5–15.5)
WBC: 7 10*3/uL (ref 4.0–10.5)

## 2018-06-13 LAB — TSH: TSH: 4.33 u[IU]/mL (ref 0.35–4.50)

## 2018-07-24 ENCOUNTER — Encounter: Payer: Self-pay | Admitting: Internal Medicine

## 2018-07-24 NOTE — Telephone Encounter (Signed)
Please contact pt to schedule for covid testing per PCP. Thanks!

## 2018-07-24 NOTE — Telephone Encounter (Signed)
Ok to refer for COVID testing, Thanks

## 2018-07-29 ENCOUNTER — Telehealth: Payer: Self-pay | Admitting: *Deleted

## 2018-07-29 DIAGNOSIS — Z20822 Contact with and (suspected) exposure to covid-19: Secondary | ICD-10-CM

## 2018-07-29 NOTE — Telephone Encounter (Signed)
Contacted pt to schedule COVID testing per Dr Jenny Reichmann, [place]; she accepted appointment at [facility] site 07/30/2018 at 1000; pt given address, location, and instructions that she and all occupants wear masks; she verbalized understanding; orders placed per protocol.

## 2018-07-30 ENCOUNTER — Other Ambulatory Visit: Payer: PRIVATE HEALTH INSURANCE

## 2018-07-30 DIAGNOSIS — Z20822 Contact with and (suspected) exposure to covid-19: Secondary | ICD-10-CM

## 2018-08-03 LAB — NOVEL CORONAVIRUS, NAA: SARS-CoV-2, NAA: NOT DETECTED

## 2018-09-04 ENCOUNTER — Ambulatory Visit (INDEPENDENT_AMBULATORY_CARE_PROVIDER_SITE_OTHER): Payer: BLUE CROSS/BLUE SHIELD | Admitting: Internal Medicine

## 2018-09-04 ENCOUNTER — Other Ambulatory Visit: Payer: Self-pay

## 2018-09-04 ENCOUNTER — Encounter: Payer: Self-pay | Admitting: Internal Medicine

## 2018-09-04 VITALS — BP 138/88 | HR 122 | Temp 98.2°F | Ht 67.0 in | Wt 383.0 lb

## 2018-09-04 DIAGNOSIS — R7302 Impaired glucose tolerance (oral): Secondary | ICD-10-CM

## 2018-09-04 DIAGNOSIS — H65112 Acute and subacute allergic otitis media (mucoid) (sanguinous) (serous), left ear: Secondary | ICD-10-CM | POA: Diagnosis not present

## 2018-09-04 DIAGNOSIS — R42 Dizziness and giddiness: Secondary | ICD-10-CM

## 2018-09-04 DIAGNOSIS — L709 Acne, unspecified: Secondary | ICD-10-CM | POA: Insufficient documentation

## 2018-09-04 DIAGNOSIS — I10 Essential (primary) hypertension: Secondary | ICD-10-CM

## 2018-09-04 DIAGNOSIS — M67431 Ganglion, right wrist: Secondary | ICD-10-CM | POA: Diagnosis not present

## 2018-09-04 MED ORDER — AZITHROMYCIN 250 MG PO TABS: ORAL_TABLET | ORAL | 1 refills | Status: DC

## 2018-09-04 MED ORDER — MECLIZINE HCL 12.5 MG PO TABS: 12.5000 mg | ORAL_TABLET | Freq: Three times a day (TID) | ORAL | 1 refills | Status: AC | PRN

## 2018-09-04 MED ORDER — ZOSTER VAC RECOMB ADJUVANTED 50 MCG/0.5ML IM SUSR: 0.5000 mL | Freq: Once | INTRAMUSCULAR | 1 refills | Status: AC

## 2018-09-04 MED ORDER — SULFACETAMIDE SODIUM (ACNE) 10 % EX LOTN: 1.0000 "application " | TOPICAL_LOTION | Freq: Every day | CUTANEOUS | 5 refills | Status: DC | PRN

## 2018-09-04 NOTE — Progress Notes (Signed)
Subjective:    Patient ID: Jodi Keller, female    DOB: 1955/05/19, 63 y.o.   MRN: [id number]  HPI  Here with c/o 3 days onset fever, left ear pain, HA, fatigue and general malaise, without sinus pain and pressrue, ST, cough or chills. Has been having 2 days or intermittent positional vertigo, asking for tx.  4 issues.  Also has a right hand/wrist growth painful and swelling, getting bigger over the past month, constantk, graduallly worsening, asking for referral.  Pt denies chest pain, increased sob or doe, wheezing, orthopnea, PND, increased LE swelling, palpitations, dizziness or syncope.  Pt denies new neurological symptoms such as new headache, or facial or extremity weakness or numbness   Pt denies polydipsia, polyuria Lost wt and did not realize it. Wt Readings from Last 3 Encounters:  09/04/18 (!) 383 lb (173.7 kg)  01/31/17 (!) 401 lb (181.9 kg)  12/18/16 (!) 392 lb (177.8 kg)   BP Readings from Last 3 Encounters:  09/04/18 138/88  01/31/17 126/84  12/18/16 130/78  Also c/o several months of worsening facial hair an acne like facial lesions, despite loss of several lbs wt. Mild but constant, getting worse Past Medical History:  Diagnosis Date  . Anemia   . Bilateral carpal tunnel syndrome 12/16/2010  . 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 Generalized anxiety disorder 11/17/2014  . GERD (gastroesophageal reflux disease)   . Hyperlipidemia 02/25/2011  . Hypertension   . Morbid obesity ([place])   . Uterine prolapse    Past Surgical History:  Procedure Laterality Date  . CESAREAN SECTION    . FOOT SURGERY      reports that she has never smoked. She has never used smokeless tobacco. She reports current alcohol use. She reports that she does not use drugs. family history includes Cancer in her father, sister, and sister; Diabetes in her maternal grandmother; Thyroid disease in an other family member. No Known Allergies Current Outpatient Medications on File  Prior to Visit  Medication Sig Dispense Refill  . aspirin 81 MG chewable tablet Chew 1 tablet (81 mg total) by mouth daily. 30 tablet 0  . benazepril (LOTENSIN) 20 MG tablet Take 1 tablet (20 mg total) by mouth daily. 90 tablet 3  . Calcium-Magnesium-Zinc 167-83-8 MG TABS Take 1 tablet by mouth daily.    . carisoprodol (SOMA) 350 MG tablet Take 1 tablet (350 mg total) by mouth 2 (two) times daily. 60 tablet 1  . cholecalciferol (VITAMIN D) 1000 units tablet Take 1 tablet (1,000 Units total) by mouth daily as needed. Often forgets to take and doesn't know strength 30 tablet 3  . clotrimazole-betamethasone (LOTRISONE) cream USE AS DIRECTED TWICE DAILY AS NEEDED 15 g 3  . diclofenac (VOLTAREN) 75 MG EC tablet Take 1 tablet (75 mg total) by mouth 2 (two) times daily as needed. 180 tablet 1  . Diclofenac Sodium (PENNSAID) 2 % SOLN Apply 1 pump twice daily. 112 g 3  . DULoxetine (CYMBALTA) 60 MG capsule TAKE 1 CAPSULE BY MOUTH EVERY DAY 90 capsule 3  . hydrochlorothiazide (MICROZIDE) 12.5 MG capsule TAKE 1 CAPSULE BY MOUTH EVERY DAY 90 capsule 3  . Multiple Vitamins-Minerals (MULTIVITAMIN & MINERAL PO) Take 1 tablet by mouth daily.    [name] omeprazole (PRILOSEC) 20 MG capsule Take 1 capsule (20 mg total) by mouth daily. 90 capsule 3   No current facility-administered medications on file prior to visit.    Review of Systems  Constitutional: Negative for  other unusual diaphoresis or sweats HENT: Negative for ear discharge or swelling Eyes: Negative for other worsening visual disturbances Respiratory: Negative for stridor or other swelling  Gastrointestinal: Negative for worsening distension or other blood Genitourinary: Negative for retention or other urinary change Musculoskeletal: Negative for other MSK pain or swelling Skin: Negative for color change or other new lesions Neurological: Negative for worsening tremors and other numbness  Psychiatric/Behavioral: Negative for worsening agitation or  other fatigue All other system neg per pt    Objective:   Physical Exam BP 138/88   Pulse (!) 122   Temp 98.2 F (36.8 C) (Oral)   Ht 5\' 7"  (1.702 m)   Wt (!) 383 lb (173.7 kg)   LMP 01/12/2011   SpO2 96%   BMI 59.99 kg/m   VS noted, mild ill Constitutional: Pt appears in NAD HENT: Head: NCAT.  Right Ear: External ear normal.  Left Ear: External ear normal.  Left TM with marked erythema and effusion Eyes: . Pupils are equal, round, and reactive to light. Conjunctivae and EOM are normal Nose: without d/c or deformity Neck: Neck supple. Gross normal ROM Cardiovascular: Normal rate and regular rhythm.   Pulmonary/Chest: Effort normal and breath sounds without rales or wheezing.  Abd:  Soft, NT, ND, + BS, no organomegaly Right dorsal wrist with soft swelling cystic mass mod tender Neurological: Pt is alert. At baseline orientation, motor grossly intact, cn 2-12 intact Skin: Skin is warm, no LE edema, facial acne like lesions noted Psychiatric: Pt behavior is normal without agitation  No other exam findings  Lab Results  Component Value Date   WBC 7.0 06/13/2018   HGB 15.6 (H) 06/13/2018   HCT 45.8 06/13/2018   PLT 300.0 06/13/2018   GLUCOSE 154 (H) 06/13/2018   CHOL 192 06/13/2018   TRIG 139.0 06/13/2018   HDL 48.30 06/13/2018   LDLCALC 116 (H) 06/13/2018   ALT 37 (H) 06/13/2018   AST 45 (H) 06/13/2018   NA 139 06/13/2018   K 4.1 06/13/2018   CL 102 06/13/2018   CREATININE 1.04 06/13/2018   BUN 16 06/13/2018   CO2 26 06/13/2018   TSH 4.33 06/13/2018   HGBA1C 6.8 (H) 06/13/2018      Assessment & Plan:

## 2018-09-04 NOTE — Assessment & Plan Note (Addendum)
Mild to mod, for antibx course,  to f/u any worsening symptoms or concerns  Note:  Total time for pt hx, exam, review of record with pt in the room, determination of diagnoses and plan for further eval and tx is > 40 min, with over 50% spent in coordination and counseling of patient including the differential dx, tx, further evaluation and other management of left otitis media, vertigo, HTn, hypergycemia, acne, and gangion cyst

## 2018-09-04 NOTE — Assessment & Plan Note (Signed)
[place] for referral hand surgury,  to f/u any worsening symptoms or concerns

## 2018-09-04 NOTE — Assessment & Plan Note (Signed)
stable overall by history and exam, recent data reviewed with pt, and pt to continue medical treatment as before,  to f/u any worsening symptoms or concerns  

## 2018-09-04 NOTE — Patient Instructions (Signed)
Please take all new medication as prescribed - the cream for acne, antibiotic for the ear, and meclizine for the dizziness  You will be contacted regarding the referral for: Hand Surgury  Please continue all other medications as before, and refills have been done if requested.  Please have the pharmacy call with any other refills you may need.  Please keep your appointments with your specialists as you may have planned

## 2018-09-04 NOTE — Assessment & Plan Note (Signed)
Mild to mod, for med tx,  to f/u any worsening symptoms or concerns

## 2018-09-04 NOTE — Assessment & Plan Note (Signed)
Positional, likely inner ear related, for meclizine prn, to f/u any worsening symptoms or concerns

## 2018-09-26 DIAGNOSIS — M7989 Other specified soft tissue disorders: Secondary | ICD-10-CM | POA: Insufficient documentation

## 2018-12-15 ENCOUNTER — Other Ambulatory Visit: Payer: Self-pay | Admitting: Internal Medicine

## 2019-04-17 ENCOUNTER — Ambulatory Visit: Payer: Self-pay | Attending: Internal Medicine

## 2019-04-17 DIAGNOSIS — Z23 Encounter for immunization: Secondary | ICD-10-CM

## 2019-04-17 NOTE — Progress Notes (Signed)
   Covid-19 Vaccination Clinic  Name:  Jodi Keller    MRN: [id number] DOB: 04/23/1955  04/17/2019  Jodi Keller was observed post Covid-19 immunization for 15 minutes without incident. She was provided with Vaccine Information Sheet and instruction to access the V-Safe system.   Jodi Keller was instructed to call 911 with any severe reactions post vaccine: [name] Difficulty breathing  . Swelling of face and throat  . A fast heartbeat  . A bad rash all over body  . Dizziness and weakness   Immunizations Administered    Name Date Dose VIS Date Route   Pfizer COVID-19 Vaccine 04/17/2019 12:17 PM 0.3 mL 01/03/2019 Intramuscular   Manufacturer: [place]   Lot: [id number]   [place]: [id number]

## 2019-04-25 ENCOUNTER — Other Ambulatory Visit: Payer: Self-pay | Admitting: Internal Medicine

## 2019-05-12 ENCOUNTER — Ambulatory Visit: Payer: Self-pay | Attending: Internal Medicine

## 2019-05-12 DIAGNOSIS — Z23 Encounter for immunization: Secondary | ICD-10-CM

## 2019-05-12 NOTE — Progress Notes (Signed)
   Covid-19 Vaccination Clinic  Name:  STOREE VETTEL    MRN: [id number] DOB: 1955/08/06  05/12/2019  Ms. Baudoin was observed post Covid-19 immunization for 15 minutes without incident. She was provided with Vaccine Information Sheet and instruction to access the V-Safe system.   Ms. Atondo was instructed to call 911 with any severe reactions post vaccine: [name] Difficulty breathing  . Swelling of face and throat  . A fast heartbeat  . A bad rash all over body  . Dizziness and weakness   Immunizations Administered    Name Date Dose VIS Date Route   Pfizer COVID-19 Vaccine 05/12/2019  4:13 PM 0.3 mL 03/19/2018 Intramuscular   Manufacturer: [place]   Lot: [id number]   [place]: [id number]

## 2019-06-11 ENCOUNTER — Ambulatory Visit (INDEPENDENT_AMBULATORY_CARE_PROVIDER_SITE_OTHER): Payer: 59 | Admitting: Internal Medicine

## 2019-06-11 ENCOUNTER — Other Ambulatory Visit: Payer: Self-pay

## 2019-06-11 VITALS — BP 140/90 | HR 86 | Temp 98.6°F | Ht 67.0 in | Wt 373.0 lb

## 2019-06-11 DIAGNOSIS — Z0000 Encounter for general adult medical examination without abnormal findings: Secondary | ICD-10-CM | POA: Diagnosis not present

## 2019-06-11 DIAGNOSIS — Z1231 Encounter for screening mammogram for malignant neoplasm of breast: Secondary | ICD-10-CM

## 2019-06-11 DIAGNOSIS — E559 Vitamin D deficiency, unspecified: Secondary | ICD-10-CM

## 2019-06-11 DIAGNOSIS — E538 Deficiency of other specified B group vitamins: Secondary | ICD-10-CM

## 2019-06-11 DIAGNOSIS — R7302 Impaired glucose tolerance (oral): Secondary | ICD-10-CM | POA: Diagnosis not present

## 2019-06-11 NOTE — Patient Instructions (Addendum)
Please remember to call for your yearly Pap smear with [facility] will be contacted regarding the referral for: colonoscopy, and mammogram  Please continue all other medications as before, and refills have been done if requested.  Please have the pharmacy call with any other refills you may need.  Please continue your efforts at being more active, low cholesterol diet, and weight control.  You are otherwise up to date with prevention measures today.  Please keep your appointments with your specialists as you may have planned  Please go to the LAB at the blood drawing area for the tests to be done tomorrow  You will be contacted by phone if any changes need to be made immediately.  Otherwise, you will receive a letter about your results with an explanation, but please check with MyChart first.  Please remember to sign up for MyChart if you have not done so, as this will be important to you in the future with finding out test results, communicating by private email, and scheduling acute appointments online when needed.  Please make an Appointment to return in 6 months, or sooner if needed

## 2019-06-11 NOTE — Progress Notes (Signed)
Subjective:    Patient ID: Jodi Keller, female    DOB: [birth year], 64 y.o.   MRN: [id number]  HPI  Here for wellness and f/u;  Overall doing ok;  Pt denies Chest pain, worsening SOB, DOE, wheezing, orthopnea, PND, worsening LE edema, palpitations, dizziness or syncope.  Pt denies neurological change such as new headache, facial or extremity weakness.  Pt denies polydipsia, polyuria, or low sugar symptoms. Pt states overall good compliance with treatment and medications, good tolerability, and has been trying to follow appropriate diet.  Pt denies worsening depressive symptoms, suicidal ideation or panic. No fever, night sweats, wt loss, loss of appetite, or other constitutional symptoms.  Pt states good ability with ADL's, has low fall risk, home safety reviewed and adequate, no other significant changes in hearing or vision, and only occasionally active with exercise.  Due for June ortho f/u for ankle replacement at [facility], still with resulting off balance and lower back pain.  Also for left cataract soon. Wt Readings from Last 3 Encounters:  06/11/19 (!) 373 lb (169.2 kg)  09/04/18 (!) 383 lb (173.7 kg)  01/31/17 (!) 401 lb (181.9 kg)   Past Medical History:  Diagnosis Date  . Anemia   . Bilateral carpal tunnel syndrome 12/16/2010  . 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 Generalized anxiety disorder 11/17/2014  . GERD (gastroesophageal reflux disease)   . Hyperlipidemia 02/25/2011  . Hypertension   . Morbid obesity ([place])   . Uterine prolapse    Past Surgical History:  Procedure Laterality Date  . CESAREAN SECTION    . FOOT SURGERY      reports that she has never smoked. She has never used smokeless tobacco. She reports current alcohol use. She reports that she does not use drugs. family history includes Cancer in her father, sister, and sister; Diabetes in her maternal grandmother; Thyroid disease in an other family member. No Known Allergies Current Outpatient  Medications on File Prior to Visit  Medication Sig Dispense Refill  . aspirin 81 MG chewable tablet Chew 1 tablet (81 mg total) by mouth daily. 30 tablet 0  . azithromycin (ZITHROMAX Z-PAK) 250 MG tablet 2 tabs by mouth day 1, then 1 per day 6 tablet 1  . benazepril (LOTENSIN) 20 MG tablet Take 1 tablet (20 mg total) by mouth daily. 90 tablet 3  . Calcium-Magnesium-Zinc 167-83-8 MG TABS Take 1 tablet by mouth daily.    . carisoprodol (SOMA) 350 MG tablet Take 1 tablet (350 mg total) by mouth 2 (two) times daily. 60 tablet 1  . cholecalciferol (VITAMIN D) 1000 units tablet Take 1 tablet (1,000 Units total) by mouth daily as needed. Often forgets to take and doesn't know strength 30 tablet 3  . clotrimazole-betamethasone (LOTRISONE) cream USE AS DIRECTED TWICE DAILY AS NEEDED 15 g 3  . diclofenac (VOLTAREN) 75 MG EC tablet TAKE 1 TABLET (75 MG TOTAL) BY MOUTH 2 (TWO) TIMES DAILY AS NEEDED. 180 tablet 1  . Diclofenac Sodium (PENNSAID) 2 % SOLN Apply 1 pump twice daily. 112 g 3  . DULoxetine (CYMBALTA) 60 MG capsule TAKE 1 CAPSULE BY MOUTH EVERY DAY 90 capsule 3  . hydrochlorothiazide (MICROZIDE) 12.5 MG capsule TAKE 1 CAPSULE BY MOUTH EVERY DAY 90 capsule 3  . meclizine (ANTIVERT) 12.5 MG tablet Take 1 tablet (12.5 mg total) by mouth 3 (three) times daily as needed for dizziness. 40 tablet 1  . meloxicam (MOBIC) 15 MG tablet Take 15 mg by mouth  daily.    . Multiple Vitamins-Minerals (MULTIVITAMIN & MINERAL PO) Take 1 tablet by mouth daily.    . Omeprazole (PRILOSEC PO) omeprazole    . omeprazole (PRILOSEC) 20 MG capsule Take 1 capsule (20 mg total) by mouth daily. 90 capsule 3  . potassium chloride (KLOR-CON 10) 10 MEQ tablet Klor-Con 10 mEq tablet,extended release    . Sulfacetamide Sodium, Acne, 10 % LOTN Apply 1 application topically daily as needed. 118 mL 5   No current facility-administered medications on file prior to visit.   Review of Systems All otherwise neg per pt    Objective:    Physical Exam BP 140/90 (BP Location: Left Arm, Patient Position: Sitting, Cuff Size: Large)   Pulse 86   Temp 98.6 F (37 C) (Oral)   Ht 5\' 7"  (1.702 m)   Wt (!) 373 lb (169.2 kg)   LMP 01/12/2011   SpO2 98%   BMI 58.42 kg/m  VS noted,  Constitutional: Pt appears in NAD HENT: Head: NCAT.  Right Ear: External ear normal.  Left Ear: External ear normal.  Eyes: . Pupils are equal, round, and reactive to light. Conjunctivae and EOM are normal Nose: without d/c or deformity Neck: Neck supple. Gross normal ROM Cardiovascular: Normal rate and regular rhythm.   Pulmonary/Chest: Effort normal and breath sounds without rales or wheezing.  Abd:  Soft, NT, ND, + BS, no organomegaly Neurological: Pt is alert. At baseline orientation, motor grossly intact Skin: Skin is warm. No rashes, other new lesions, no LE edema Psychiatric: Pt behavior is normal without agitation  All otherwise neg per pt Lab Results  Component Value Date   WBC 8.4 06/12/2019   HGB 14.5 06/12/2019   HCT 43.9 06/12/2019   PLT 299.0 06/12/2019   GLUCOSE 99 06/12/2019   CHOL 203 (H) 06/12/2019   TRIG 139.0 06/12/2019   HDL 47.80 06/12/2019   LDLCALC 127 (H) 06/12/2019   ALT 24 06/12/2019   AST 23 06/12/2019   NA 136 06/12/2019   K 4.4 06/12/2019   CL 101 06/12/2019   CREATININE 0.98 06/12/2019   BUN 22 06/12/2019   CO2 31 06/12/2019   TSH 3.41 06/12/2019   HGBA1C 6.3 06/12/2019   MICROALBUR <0.7 06/12/2019      Assessment & Plan:

## 2019-06-12 ENCOUNTER — Other Ambulatory Visit (INDEPENDENT_AMBULATORY_CARE_PROVIDER_SITE_OTHER): Payer: 59

## 2019-06-12 ENCOUNTER — Other Ambulatory Visit: Payer: Self-pay | Admitting: Internal Medicine

## 2019-06-12 DIAGNOSIS — E538 Deficiency of other specified B group vitamins: Secondary | ICD-10-CM

## 2019-06-12 DIAGNOSIS — R7302 Impaired glucose tolerance (oral): Secondary | ICD-10-CM | POA: Diagnosis not present

## 2019-06-12 DIAGNOSIS — Z0000 Encounter for general adult medical examination without abnormal findings: Secondary | ICD-10-CM | POA: Diagnosis not present

## 2019-06-12 DIAGNOSIS — E559 Vitamin D deficiency, unspecified: Secondary | ICD-10-CM

## 2019-06-12 LAB — CBC WITH DIFFERENTIAL/PLATELET
Basophils Absolute: 0 10*3/uL (ref 0.0–0.1)
Basophils Relative: 0.4 % (ref 0.0–3.0)
Eosinophils Absolute: 0.2 10*3/uL (ref 0.0–0.7)
Eosinophils Relative: 2.3 % (ref 0.0–5.0)
HCT: 43.9 % (ref 36.0–46.0)
Hemoglobin: 14.5 g/dL (ref 12.0–15.0)
Lymphocytes Relative: 21 % (ref 12.0–46.0)
Lymphs Abs: 1.8 10*3/uL (ref 0.7–4.0)
MCHC: 32.9 g/dL (ref 30.0–36.0)
MCV: 93.4 fl (ref 78.0–100.0)
Monocytes Absolute: 0.7 10*3/uL (ref 0.1–1.0)
Monocytes Relative: 8.5 % (ref 3.0–12.0)
Neutro Abs: 5.7 10*3/uL (ref 1.4–7.7)
Neutrophils Relative %: 67.8 % (ref 43.0–77.0)
Platelets: 299 10*3/uL (ref 150.0–400.0)
RBC: 4.7 Mil/uL (ref 3.87–5.11)
RDW: 14.6 % (ref 11.5–15.5)
WBC: 8.4 10*3/uL (ref 4.0–10.5)

## 2019-06-12 LAB — BASIC METABOLIC PANEL
BUN: 22 mg/dL (ref 6–23)
CO2: 31 mEq/L (ref 19–32)
Calcium: 9.8 mg/dL (ref 8.4–10.5)
Chloride: 101 mEq/L (ref 96–112)
Creatinine, Ser: 0.98 mg/dL (ref 0.40–1.20)
GFR: 57.15 mL/min — ABNORMAL LOW (ref >60.00)
Glucose, Bld: 99 mg/dL (ref 70–99)
Potassium: 4.4 mEq/L (ref 3.5–5.1)
Sodium: 136 mEq/L (ref 135–145)

## 2019-06-12 LAB — TSH: TSH: 3.41 u[IU]/mL (ref 0.35–4.50)

## 2019-06-12 LAB — HEPATIC FUNCTION PANEL
ALT: 24 U/L (ref 0–35)
AST: 23 U/L (ref 0–37)
Albumin: 3.8 g/dL (ref 3.5–5.2)
Alkaline Phosphatase: 80 U/L (ref 39–117)
Bilirubin, Direct: 0.2 mg/dL (ref 0.0–0.3)
Total Bilirubin: 0.7 mg/dL (ref 0.2–1.2)
Total Protein: 7.2 g/dL (ref 6.0–8.3)

## 2019-06-12 LAB — LIPID PANEL
Cholesterol: 203 mg/dL — ABNORMAL HIGH (ref 0–200)
HDL: 47.8 mg/dL (ref >39.00)
LDL Cholesterol: 127 mg/dL — ABNORMAL HIGH (ref 0–99)
NonHDL: 155.14
Total CHOL/HDL Ratio: 4
Triglycerides: 139 mg/dL (ref 0.0–149.0)
VLDL: 27.8 mg/dL (ref 0.0–40.0)

## 2019-06-12 LAB — VITAMIN B12: Vitamin B-12: 495 pg/mL (ref 211–911)

## 2019-06-12 LAB — MICROALBUMIN / CREATININE URINE RATIO
Creatinine,U: 91.8 mg/dL
Microalb Creat Ratio: 0.8 mg/g (ref 0.0–30.0)
Microalb, Ur: <0.7 mg/dL (ref 0.0–1.9)

## 2019-06-12 LAB — VITAMIN D 25 HYDROXY (VIT D DEFICIENCY, FRACTURES): VITD: 65.86 ng/mL (ref 30.00–100.00)

## 2019-06-12 LAB — HEMOGLOBIN A1C: Hgb A1c MFr Bld: 6.3 % (ref 4.6–6.5)

## 2019-06-12 MED ORDER — ATORVASTATIN CALCIUM 10 MG PO TABS: 10.0000 mg | ORAL_TABLET | Freq: Every day | ORAL | 3 refills | Status: DC

## 2019-06-14 ENCOUNTER — Encounter: Payer: Self-pay | Admitting: Internal Medicine

## 2019-06-14 ENCOUNTER — Other Ambulatory Visit: Payer: Self-pay | Admitting: Internal Medicine

## 2019-06-14 NOTE — Telephone Encounter (Signed)
Please refill as per office routine med refill policy (all routine meds refilled for 3 mo or monthly per pt preference up to one year from last visit, then month to month grace period for 3 mo, then further med refills will have to be denied)  

## 2019-06-14 NOTE — Assessment & Plan Note (Signed)
stable overall by history and exam, recent data reviewed with pt, and pt to continue medical treatment as before,  to f/u any worsening symptoms or concerns  

## 2019-06-14 NOTE — Assessment & Plan Note (Signed)

## 2019-06-16 ENCOUNTER — Other Ambulatory Visit: Payer: Self-pay | Admitting: Internal Medicine

## 2019-06-17 ENCOUNTER — Other Ambulatory Visit: Payer: Self-pay | Admitting: Internal Medicine

## 2019-06-19 ENCOUNTER — Encounter: Payer: Self-pay | Admitting: Internal Medicine

## 2019-07-11 ENCOUNTER — Other Ambulatory Visit: Payer: Self-pay

## 2019-07-11 ENCOUNTER — Ambulatory Visit
Admission: RE | Admit: 2019-07-11 | Discharge: 2019-07-11 | Disposition: A | Discharge status: Home / Self Care | Payer: 59 | Source: Ambulatory Visit | Attending: Internal Medicine | Admitting: Internal Medicine

## 2019-07-11 DIAGNOSIS — Z1231 Encounter for screening mammogram for malignant neoplasm of breast: Secondary | ICD-10-CM

## 2019-08-15 ENCOUNTER — Telehealth: Payer: Self-pay | Admitting: *Deleted

## 2019-08-15 NOTE — Telephone Encounter (Signed)
CAlled pt to schedule OV-per Dr Henrene Pastor   [facility]  PV 8-6 and Colon canceled today

## 2019-08-15 NOTE — Telephone Encounter (Signed)
Dr Henrene Pastor,  This pt is scheduled for a colon with you 8-20 Friday- In reviewing her chart, she has a BMI of 58.41-  Weight 373.0lb  - her last colon was in 2008 with you.  Do you want her to Have an OV prior to her colon or can she be direct at [facility]?  Please advise, and thanks for your time, Marijean Niemann

## 2019-08-15 NOTE — Telephone Encounter (Signed)
Ov with Dr Henrene Pastor 10-24-2019 at 3 pm

## 2019-08-15 NOTE — Telephone Encounter (Signed)
OV

## 2019-09-12 ENCOUNTER — Encounter: Payer: 59 | Admitting: Internal Medicine

## 2019-10-24 ENCOUNTER — Encounter: Payer: Self-pay | Admitting: Internal Medicine

## 2019-10-24 ENCOUNTER — Ambulatory Visit (INDEPENDENT_AMBULATORY_CARE_PROVIDER_SITE_OTHER): Payer: 59 | Admitting: Internal Medicine

## 2019-10-24 VITALS — BP 130/84 | HR 96 | Ht 67.0 in | Wt 380.0 lb

## 2019-10-24 DIAGNOSIS — K219 Gastro-esophageal reflux disease without esophagitis: Secondary | ICD-10-CM

## 2019-10-24 DIAGNOSIS — Z1211 Encounter for screening for malignant neoplasm of colon: Secondary | ICD-10-CM

## 2019-10-24 DIAGNOSIS — R1319 Other dysphagia: Secondary | ICD-10-CM | POA: Diagnosis not present

## 2019-10-24 MED ORDER — NA SULFATE-K SULFATE-MG SULF 17.5-3.13-1.6 GM/177ML PO SOLN: 1.0000 | Freq: Once | ORAL | 0 refills | Status: AC

## 2019-10-24 NOTE — Patient Instructions (Signed)
You have been scheduled for an endoscopy and colonoscopy. Please follow the written instructions given to you at your visit today. Please pick up your prep supplies at the pharmacy within the next 1-3 days. If you use inhalers (even only as needed), please bring them with you on the day of your procedure.  

## 2019-10-24 NOTE — Progress Notes (Signed)
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  I saw the patient in March 2008 for anemia with low iron saturation and microcytosis.  She underwent colonoscopy with intubation of the terminal ileum.  Examination was normal (diminutive hyperplastic polyp removed).  Her iron deficiency anemia was felt secondary to menstrual blood loss.  She has not had colonoscopy since but is interested.  She denies change in bowel habits or bleeding.  She does have chronic GERD for which she takes omeprazole.  This controls symptoms.  She does report intermittent solid food dysphagia approximately once per week.  There is no family history of gastrointestinal malignancy.  She is anticipating ankle surgery at [facility] in January.  Review of blood work from May 2021 shows normal comprehensive metabolic panel.  Normal CBC with hemoglobin 14.5.  He has completed her Covid vaccination series  REVIEW OF SYSTEMS:  All non-GI ROS negative except for  Past Medical History:  Diagnosis Date  . Anemia   . Bilateral carpal tunnel syndrome 12/16/2010  . 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 Generalized anxiety disorder 11/17/2014  . GERD (gastroesophageal reflux disease)   . Hyperlipidemia 02/25/2011  . Hypertension   . Morbid obesity ([place])   . Uterine prolapse     Past Surgical History:  Procedure Laterality Date  . CESAREAN SECTION    . FOOT SURGERY      Social History Jodi Keller  reports that she has never smoked. She has never used smokeless tobacco. She reports current alcohol use. She reports that she does not use drugs.  family history includes Cancer in her father, sister, and sister; Diabetes in her maternal grandmother; Thyroid disease in an other family member.  No Known Allergies     PHYSICAL EXAMINATION: Vital signs: BP 130/84   Pulse 96   Ht 5\' 7"   (1.702 m)   Wt (!) 380 lb (172.4 kg)   LMP 01/12/2011   BMI 59.52 kg/m   Constitutional: Markedly obese but otherwise generally well-appearing, no acute distress Psychiatric: alert and oriented x3, cooperative Eyes: extraocular movements intact, anicteric, conjunctiva pink Mouth: oral pharynx moist, no lesions Neck: supple no lymphadenopathy Cardiovascular: heart regular rate and rhythm, no murmur Lungs: clear to auscultation bilaterally Abdomen: soft, nontender, nondistended, no obvious ascites, no peritoneal signs, normal bowel sounds, no organomegaly Rectal: Deferred until colonoscopy Extremities: no clubbing, cyanosis, or lower extremity edema bilaterally Skin: no lesions on visible extremities. Neuro: No focal deficits. No asterixis.    ASSESSMENT:  1.  Colon cancer screening.  Baseline risk.  Normal examination 2008 2.  Chronic GERD.  Symptoms controlled PPI 3.  Intermittent solid food dysphagia.  Rule out peptic stricture 4.  Morbid obesity with BMI 60   PLAN:  1.  Colonoscopy.  We discussed her HIGH RISK nature due to her body habitus.  Thus, the procedure will need to be performed in the hospital with monitored anesthesia care.The nature of the procedure, as well as the risks, benefits, and alternatives were carefully and thoroughly reviewed with the patient. Ample time for discussion and questions allowed. The patient understood, was satisfied, and agreed to proceed. 2.  Upper endoscopy with possible esophageal dilation.  Again, the patient is high risk due to her body habitus.  This procedure will also be performed in the hospital with monitored anesthesia care.The nature of the procedure, as well  as the risks, benefits, and alternatives were carefully and thoroughly reviewed with the patient. Ample time for discussion and questions allowed. The patient understood, was satisfied, and agreed to proceed. 3.  Reflux precautions 4.  Weight loss 5.  Continue PPI

## 2019-11-03 ENCOUNTER — Encounter: Payer: Self-pay | Admitting: Internal Medicine

## 2019-12-07 ENCOUNTER — Other Ambulatory Visit: Payer: Self-pay | Admitting: Internal Medicine

## 2019-12-12 ENCOUNTER — Other Ambulatory Visit: Payer: Self-pay

## 2019-12-12 ENCOUNTER — Ambulatory Visit (INDEPENDENT_AMBULATORY_CARE_PROVIDER_SITE_OTHER): Payer: 59 | Admitting: Internal Medicine

## 2019-12-12 ENCOUNTER — Encounter: Payer: Self-pay | Admitting: Internal Medicine

## 2019-12-12 VITALS — BP 140/88 | HR 115 | Temp 98.9°F | Ht 67.0 in | Wt 386.0 lb

## 2019-12-12 DIAGNOSIS — N393 Stress incontinence (female) (male): Secondary | ICD-10-CM

## 2019-12-12 DIAGNOSIS — R7302 Impaired glucose tolerance (oral): Secondary | ICD-10-CM | POA: Diagnosis not present

## 2019-12-12 DIAGNOSIS — Z23 Encounter for immunization: Secondary | ICD-10-CM | POA: Diagnosis not present

## 2019-12-12 DIAGNOSIS — E7849 Other hyperlipidemia: Secondary | ICD-10-CM | POA: Diagnosis not present

## 2019-12-12 DIAGNOSIS — I10 Essential (primary) hypertension: Secondary | ICD-10-CM | POA: Diagnosis not present

## 2019-12-12 NOTE — Patient Instructions (Signed)
You will be contacted regarding the referral for: urology  Please continue all other medications as before, and refills have been done if requested.  Please have the pharmacy call with any other refills you may need.  Please continue your efforts at being more active, low cholesterol diet, and weight control..  Please keep your appointments with your specialists as you may have planned

## 2019-12-12 NOTE — Progress Notes (Addendum)
Subjective:    Patient ID: Jodi Keller, female    DOB: Nov 03, 1955, 64 y.o.   MRN: [id number]  HPI  Here to f/u, Denies urinary symptoms such as dysuria, frequency, urgency, flank pain, hematuria or n/v, fever, chills but has frank incontinence with pressure on the abd such as standing up or bending at times, worse x several months. also  sched for colonoscopy in 2 wks, Denies worsening reflux, abd pain, dysphagia, n/v, bowel change or blood.  Tolerating new statin well.  Pt denies chest pain, increased sob or doe, wheezing, orthopnea, PND, increased LE swelling, palpitations, dizziness or syncope.  Pt denies new neurological symptoms such as new headache, or facial or extremity weakness or numbness   Pt denies polydipsia, polyuria, but her most pressing issue is recent AVN of the right foot talus, forced to walk with walker for balance as the right foot inverts with each step and she is walking on the outside of the right foot.  Has seen ortho, with plan for rod placement soon. Past Medical History:  Diagnosis Date  . Anemia   . Bilateral carpal tunnel syndrome 12/16/2010  . 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 Generalized anxiety disorder 11/17/2014  . GERD (gastroesophageal reflux disease)   . Hyperlipidemia 02/25/2011  . Hypertension   . Morbid obesity ([place])   . Uterine prolapse    Past Surgical History:  Procedure Laterality Date  . CESAREAN SECTION    . FOOT SURGERY      reports that she has never smoked. She has never used smokeless tobacco. She reports current alcohol use. She reports that she does not use drugs. family history includes Cancer in her father, sister, and sister; Diabetes in her maternal grandmother; Thyroid disease in an other family member. No Known Allergies Current Outpatient Medications on File Prior to Visit  Medication Sig Dispense Refill  . amoxicillin (AMOXIL) 500 MG capsule Take 500 mg by mouth 3 (three) times daily.    [name] aspirin 81  MG chewable tablet Chew 1 tablet (81 mg total) by mouth daily. 30 tablet 0  . atorvastatin (LIPITOR) 10 MG tablet Take 1 tablet (10 mg total) by mouth daily. 90 tablet 3  . azithromycin (ZITHROMAX Z-PAK) 250 MG tablet 2 tabs by mouth day 1, then 1 per day 6 tablet 1  . benazepril (LOTENSIN) 20 MG tablet TAKE 1 TABLET BY MOUTH EVERY DAY 90 tablet 3  . Calcium-Magnesium-Zinc 167-83-8 MG TABS Take 1 tablet by mouth daily.    . carisoprodol (SOMA) 350 MG tablet Take 1 tablet (350 mg total) by mouth 2 (two) times daily. 60 tablet 1  . cholecalciferol (VITAMIN D) 1000 units tablet Take 1 tablet (1,000 Units total) by mouth daily as needed. Often forgets to take and doesn't know strength 30 tablet 3  . clotrimazole-betamethasone (LOTRISONE) cream USE AS DIRECTED TWICE DAILY AS NEEDED 15 g 3  . diclofenac (VOLTAREN) 75 MG EC tablet TAKE 1 TABLET BY MOUTH TWICE A DAY AS NEEDED 180 tablet 1  . Diclofenac Sodium (PENNSAID) 2 % SOLN Apply 1 pump twice daily. 112 g 3  . DULoxetine (CYMBALTA) 60 MG capsule TAKE 1 CAPSULE BY MOUTH EVERY DAY 90 capsule 3  . hydrochlorothiazide (MICROZIDE) 12.5 MG capsule TAKE 1 CAPSULE BY MOUTH EVERY DAY 90 capsule 3  . Multiple Vitamins-Minerals (MULTIVITAMIN & MINERAL PO) Take 1 tablet by mouth daily.    . Omeprazole (PRILOSEC PO) omeprazole    . omeprazole (PRILOSEC) 20  MG capsule TAKE 1 CAPSULE BY MOUTH EVERY DAY 90 capsule 3  . potassium chloride (KLOR-CON 10) 10 MEQ tablet Klor-Con 10 mEq tablet,extended release    . Sulfacetamide Sodium, Acne, 10 % LOTN Apply 1 application topically daily as needed. 118 mL 5   No current facility-administered medications on file prior to visit.   Review of Systems All otherwise neg per pt     Objective:   Physical Exam BP 140/88 (BP Location: Left Arm, Patient Position: Sitting, Cuff Size: Large)   Pulse (!) 115   Temp 98.9 F (37.2 C) (Oral)   Ht 5\' 7"  (1.702 m)   Wt (!) 386 lb (175.1 kg)   LMP 01/12/2011   SpO2 96%   BMI  60.46 kg/m  VS noted,  Constitutional: Pt appears in NAD HENT: Head: NCAT.  Right Ear: External ear normal.  Left Ear: External ear normal.  Eyes: . Pupils are equal, round, and reactive to light. Conjunctivae and EOM are normal Nose: without d/c or deformity Neck: Neck supple. Gross normal ROM Cardiovascular: Normal rate and regular rhythm.   Pulmonary/Chest: Effort normal and breath sounds without rales or wheezing.  Abd:  Soft, NT, ND, + BS, no organomegaly Neurological: Pt is alert. At baseline orientation, motor grossly intact Walks with walker, right foot and ankle inverts with each step Skin: Skin is warm. No rashes, other new lesions, no LE edema Psychiatric: Pt behavior is normal without agitation  Lab Results  Component Value Date   WBC 8.4 06/12/2019   HGB 14.5 06/12/2019   HCT 43.9 06/12/2019   PLT 299.0 06/12/2019   GLUCOSE 99 06/12/2019   CHOL 203 (H) 06/12/2019   TRIG 139.0 06/12/2019   HDL 47.80 06/12/2019   LDLCALC 127 (H) 06/12/2019   ALT 24 06/12/2019   AST 23 06/12/2019   NA 136 06/12/2019   K 4.4 06/12/2019   CL 101 06/12/2019   CREATININE 0.98 06/12/2019   BUN 22 06/12/2019   CO2 31 06/12/2019   TSH 3.41 06/12/2019   HGBA1C 6.3 06/12/2019   MICROALBUR <0.7 06/12/2019       Assessment & Plan:

## 2019-12-13 ENCOUNTER — Encounter: Payer: Self-pay | Admitting: Internal Medicine

## 2019-12-13 NOTE — Assessment & Plan Note (Signed)
stable overall by history and exam, recent data reviewed with pt, and pt to continue medical treatment as before,  to f/u any worsening symptoms or concerns  

## 2019-12-13 NOTE — Assessment & Plan Note (Signed)
Tolerating statin well, declines lab today, cont statin and low chol diet

## 2019-12-13 NOTE — Assessment & Plan Note (Addendum)
Declines ua today, for urology referral, may benefit from kegal excercises  I spent 31 minutes in preparing to see the patient by review of recent labs, imaging and procedures, obtaining and reviewing separately obtained history, communicating with the patient and family or caregiver, ordering medications, tests or procedures, and documenting clinical information in the EHR including the differential Dx, treatment, and any further evaluation and other management of stress incontinence, hyperglycemia, hld, htn

## 2019-12-17 ENCOUNTER — Encounter (HOSPITAL_COMMUNITY): Payer: Self-pay | Admitting: Internal Medicine

## 2019-12-17 ENCOUNTER — Other Ambulatory Visit: Payer: Self-pay

## 2019-12-25 ENCOUNTER — Other Ambulatory Visit (HOSPITAL_COMMUNITY)
Admission: RE | Admit: 2019-12-25 | Discharge: 2019-12-25 | Disposition: A | Discharge status: Home / Self Care | Payer: Managed Care, Other (non HMO) | Source: Ambulatory Visit | Attending: Internal Medicine | Admitting: Internal Medicine

## 2019-12-25 DIAGNOSIS — Z01812 Encounter for preprocedural laboratory examination: Secondary | ICD-10-CM | POA: Insufficient documentation

## 2019-12-25 DIAGNOSIS — Z20822 Contact with and (suspected) exposure to covid-19: Secondary | ICD-10-CM | POA: Diagnosis not present

## 2019-12-25 LAB — SARS CORONAVIRUS 2 (TAT 6-24 HRS): SARS Coronavirus 2: NEGATIVE

## 2019-12-28 NOTE — Anesthesia Preprocedure Evaluation (Addendum)
Anesthesia Evaluation  Patient identified by MRN, date of birth, ID band Patient awake    Reviewed: Allergy & Precautions, NPO status , Patient's Chart, lab work & pertinent test results  Airway Mallampati: III  TM Distance: >3 FB Neck ROM: Full    Dental no notable dental hx. (+) Teeth Intact, Dental Advisory Given   Pulmonary neg pulmonary ROS,    Pulmonary exam normal breath sounds clear to auscultation       Cardiovascular hypertension, Pt. on medications Normal cardiovascular exam Rhythm:Regular Rate:Normal     Neuro/Psych PSYCHIATRIC DISORDERS Anxiety  Neuromuscular disease    GI/Hepatic Neg liver ROS, GERD  Medicated,  Endo/Other  negative endocrine ROS  Renal/GU negative Renal ROS     Musculoskeletal  (+) Arthritis ,   Abdominal (+) + obese,   Peds  Hematology   Anesthesia Other Findings   Reproductive/Obstetrics                            Anesthesia Physical Anesthesia Plan  ASA: III  Anesthesia Plan: MAC   Post-op Pain Management:    Induction:   PONV Risk Score and Plan: Treatment may vary due to age or medical condition  Airway Management Planned: Nasal Cannula and Natural Airway  Additional Equipment: None  Intra-op Plan:   Post-operative Plan:   Informed Consent: I have reviewed the patients History and Physical, chart, labs and discussed the procedure including the risks, benefits and alternatives for the proposed anesthesia with the patient or authorized representative who has indicated his/her understanding and acceptance.     Dental advisory given  Plan Discussed with:   Anesthesia Plan Comments: (Colon Ca screening   And dysphagia)       Anesthesia Quick Evaluation

## 2019-12-29 ENCOUNTER — Telehealth: Payer: Self-pay

## 2019-12-29 ENCOUNTER — Emergency Department (HOSPITAL_COMMUNITY)
Admission: EM | Admit: 2019-12-29 | Discharge: 2019-12-29 | Disposition: A | Discharge status: Home / Self Care | Payer: Managed Care, Other (non HMO) | Source: Home / Self Care

## 2019-12-29 ENCOUNTER — Encounter (HOSPITAL_COMMUNITY): Payer: Self-pay

## 2019-12-29 ENCOUNTER — Ambulatory Visit (HOSPITAL_COMMUNITY): Payer: Managed Care, Other (non HMO) | Admitting: Anesthesiology

## 2019-12-29 ENCOUNTER — Telehealth: Payer: Self-pay | Admitting: Internal Medicine

## 2019-12-29 ENCOUNTER — Ambulatory Visit (HOSPITAL_COMMUNITY)
Admission: RE | Admit: 2019-12-29 | Discharge: 2019-12-29 | Disposition: A | Discharge status: Home / Self Care | Payer: Managed Care, Other (non HMO) | Attending: Internal Medicine | Admitting: Internal Medicine

## 2019-12-29 ENCOUNTER — Other Ambulatory Visit: Payer: Self-pay

## 2019-12-29 ENCOUNTER — Encounter (HOSPITAL_COMMUNITY)
Admission: RE | Disposition: A | Discharge status: Home / Self Care | Payer: Self-pay | Source: Home / Self Care | Attending: Internal Medicine

## 2019-12-29 ENCOUNTER — Encounter (HOSPITAL_COMMUNITY): Payer: Self-pay | Admitting: Internal Medicine

## 2019-12-29 DIAGNOSIS — Q394 Esophageal web: Secondary | ICD-10-CM | POA: Insufficient documentation

## 2019-12-29 DIAGNOSIS — K9189 Other postprocedural complications and disorders of digestive system: Secondary | ICD-10-CM | POA: Insufficient documentation

## 2019-12-29 DIAGNOSIS — Z6841 Body Mass Index (BMI) 40.0 and over, adult: Secondary | ICD-10-CM | POA: Insufficient documentation

## 2019-12-29 DIAGNOSIS — K449 Diaphragmatic hernia without obstruction or gangrene: Secondary | ICD-10-CM | POA: Diagnosis not present

## 2019-12-29 DIAGNOSIS — K222 Esophageal obstruction: Secondary | ICD-10-CM | POA: Diagnosis not present

## 2019-12-29 DIAGNOSIS — R1310 Dysphagia, unspecified: Secondary | ICD-10-CM | POA: Insufficient documentation

## 2019-12-29 DIAGNOSIS — Z1211 Encounter for screening for malignant neoplasm of colon: Secondary | ICD-10-CM | POA: Diagnosis not present

## 2019-12-29 DIAGNOSIS — Z5321 Procedure and treatment not carried out due to patient leaving prior to being seen by health care provider: Secondary | ICD-10-CM | POA: Insufficient documentation

## 2019-12-29 DIAGNOSIS — R1319 Other dysphagia: Secondary | ICD-10-CM | POA: Diagnosis not present

## 2019-12-29 DIAGNOSIS — K219 Gastro-esophageal reflux disease without esophagitis: Secondary | ICD-10-CM | POA: Insufficient documentation

## 2019-12-29 DIAGNOSIS — R1013 Epigastric pain: Secondary | ICD-10-CM

## 2019-12-29 HISTORY — PX: COLONOSCOPY WITH PROPOFOL: SHX5780

## 2019-12-29 HISTORY — PX: ESOPHAGOGASTRODUODENOSCOPY (EGD) WITH PROPOFOL: SHX5813

## 2019-12-29 HISTORY — PX: BALLOON DILATION: SHX5330

## 2019-12-29 SURGERY — COLONOSCOPY WITH PROPOFOL
Anesthesia: Monitor Anesthesia Care

## 2019-12-29 MED ORDER — PROPOFOL 500 MG/50ML IV EMUL
INTRAVENOUS | Status: AC
  Filled 2019-12-29: qty 50

## 2019-12-29 MED ORDER — LACTATED RINGERS IV SOLN: INTRAVENOUS | Status: DC | PRN

## 2019-12-29 MED ORDER — FENTANYL CITRATE (PF) 100 MCG/2ML IJ SOLN
INTRAMUSCULAR | Status: AC
  Filled 2019-12-29: qty 2

## 2019-12-29 MED ORDER — FENTANYL CITRATE (PF) 100 MCG/2ML IJ SOLN
INTRAMUSCULAR | Status: DC | PRN
  Administered 2019-12-29: 50 ug via INTRAVENOUS

## 2019-12-29 MED ORDER — LACTATED RINGERS IV SOLN: Freq: Once | INTRAVENOUS | Status: AC

## 2019-12-29 MED ORDER — PROPOFOL 1000 MG/100ML IV EMUL
INTRAVENOUS | Status: AC
  Filled 2019-12-29: qty 200

## 2019-12-29 MED ORDER — PROPOFOL 10 MG/ML IV BOLUS
INTRAVENOUS | Status: AC
  Filled 2019-12-29: qty 20

## 2019-12-29 MED ORDER — SODIUM CHLORIDE 0.9 % IV SOLN: INTRAVENOUS | Status: DC

## 2019-12-29 MED ORDER — PROPOFOL 500 MG/50ML IV EMUL
INTRAVENOUS | Status: DC | PRN
  Administered 2019-12-29: 80 mg via INTRAVENOUS
  Administered 2019-12-29: 100 ug/kg/min via INTRAVENOUS

## 2019-12-29 SURGICAL SUPPLY — 25 items

## 2019-12-29 NOTE — ED Triage Notes (Signed)
Patient  States she had an endoscopy and a colonoscopy completed today. Patient state after returning home she waited 2 hours prior to eating. Patient states she feels like she has a rock in her esophagus. Patient denies seeing any blood. Patient states she had pain when she ate prior to the endoscopy, but worse after the procedure.

## 2019-12-29 NOTE — H&P (Signed)
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  I saw the patient in March 2008 for anemia with low iron saturation and microcytosis.  She underwent colonoscopy with intubation of the terminal ileum.  Examination was normal (diminutive hyperplastic polyp removed).  Her iron deficiency anemia was felt secondary to menstrual blood loss.  She has not had colonoscopy since but is interested.  She denies change in bowel habits or bleeding.  She does have chronic GERD for which she takes omeprazole.  This controls symptoms.  She does report intermittent solid food dysphagia approximately once per week.  There is no family history of gastrointestinal malignancy.  She is anticipating ankle surgery at [facility] in January.  Review of blood work from May 2021 shows normal comprehensive metabolic panel.  Normal CBC with hemoglobin 14.5.  He has completed her Covid vaccination series  REVIEW OF SYSTEMS:  All non-GI ROS negative except for      Past Medical History:  Diagnosis Date  . Anemia   . Bilateral carpal tunnel syndrome 12/16/2010  . 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 Generalized anxiety disorder 11/17/2014  . GERD (gastroesophageal reflux disease)   . Hyperlipidemia 02/25/2011  . Hypertension   . Morbid obesity ([place])   . Uterine prolapse          Past Surgical History:  Procedure Laterality Date  . CESAREAN SECTION    . FOOT SURGERY      Social History Jodi Keller  reports that she has never smoked. She has never used smokeless tobacco. She reports current alcohol use. She reports that she does not use drugs.  family history includes Cancer in her father, sister, and sister; Diabetes in her maternal grandmother; Thyroid disease in an other family member.  No Known Allergies     PHYSICAL EXAMINATION: Vital signs: BP  130/84   Pulse 96   Ht 5\' 7"  (1.702 m)   Wt (!) 380 lb (172.4 kg)   LMP 01/12/2011   BMI 59.52 kg/m   Constitutional: Markedly obese but otherwise generally well-appearing, no acute distress Psychiatric: alert and oriented x3, cooperative Eyes: extraocular movements intact, anicteric, conjunctiva pink Mouth: oral pharynx moist, no lesions Neck: supple no lymphadenopathy Cardiovascular: heart regular rate and rhythm, no murmur Lungs: clear to auscultation bilaterally Abdomen: soft, nontender, nondistended, no obvious ascites, no peritoneal signs, normal bowel sounds, no organomegaly Rectal: Deferred until colonoscopy Extremities: no clubbing, cyanosis, or lower extremity edema bilaterally Skin: no lesions on visible extremities. Neuro: No focal deficits. No asterixis.    ASSESSMENT:  1.  Colon cancer screening.  Baseline risk.  Normal examination 2008 2.  Chronic GERD.  Symptoms controlled PPI 3.  Intermittent solid food dysphagia.  Rule out peptic stricture 4.  Morbid obesity with BMI 60   PLAN:  1.  Colonoscopy.  We discussed her HIGH RISK nature due to her body habitus.  Thus, the procedure will need to be performed in the hospital with monitored anesthesia care.The nature of the procedure, as well as the risks, benefits, and alternatives were carefully and thoroughly reviewed with the patient. Ample time for discussion and questions allowed. The patient understood, was satisfied, and agreed to proceed. 2.  Upper endoscopy with possible esophageal dilation.  Again, the patient is high risk due to her body habitus.  This procedure will also be performed in the hospital with  monitored anesthesia care.The nature of the procedure, as well as the risks, benefits, and alternatives were carefully and thoroughly reviewed with the patient. Ample time for discussion and questions allowed. The patient understood, was satisfied, and agreed to proceed. 3.  Reflux precautions 4.  Weight  loss 5.  Continue PPI  ADDENDUM: No interval problems since the above documented office visit.  No clinical changes.  She tolerated her prep well with nausea but no vomiting.  At bedside examination today was unchanged.  All questions answered to her satisfaction for colonoscopy and upper endoscopy with possible esophageal dilation.  Docia Chuck. Geri Seminole., M.D. [facility] Division of Gastroenterology

## 2019-12-29 NOTE — Telephone Encounter (Signed)
The soonest one I could get scheduled is tomorrow at [facility] at 11am. Do you want her to go to the ER? Please advise.

## 2019-12-29 NOTE — Op Note (Signed)
[facility] Patient Name: Jodi Keller Procedure Date: 12/29/2019 MRN: [id number] Attending MD: Docia Chuck. Henrene Pastor , MD Date of Birth: 17-Jan-1956 CSN: [id number] Age: 64 Admit Type: Outpatient Procedure:                Upper GI endoscopy with balloon dilation of the                            esophagus Indications:              Dysphagia (intermittent solid food), Esophageal                            reflux Providers:                Docia Chuck. Henrene Pastor, MD, Clyde Lundborg, RN, Cletis [place],                            Technician Referring MD:             Cathlean Cower, MD Medicines:                Monitored Anesthesia Care Complications:            No immediate complications. Estimated Blood Loss:     Estimated blood loss: none. Procedure:                Pre-Anesthesia Assessment:                           - Prior to the procedure, a History and Physical                            was performed, and patient medications and                            allergies were reviewed. The patient's tolerance of                            previous anesthesia was also reviewed. The risks                            and benefits of the procedure and the sedation                            options and risks were discussed with the patient.                            All questions were answered, and informed consent                            was obtained. Prior Anticoagulants: The patient has                            taken no previous anticoagulant or antiplatelet                            agents. After reviewing  the risks and benefits, the                            patient was deemed in satisfactory condition to                            undergo the procedure.                           After obtaining informed consent, the endoscope was                            passed under direct vision. Throughout the                            procedure, the patient's blood pressure, pulse, and                             oxygen saturations were monitored continuously. The                            [id number]-[id number] ([id number]) Olympus gastroscope was                            introduced through the mouth, and advanced to the                            second part of duodenum. The upper GI endoscopy was                            accomplished without difficulty. The patient                            tolerated the procedure well. 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 This was sequentially dilated with a TTS balloon       dilator at diameters of 18, 19, and 20 mm. There was a mucosal wrent       observed post dilation.      The esophagus was otherwise normal.      The stomach was normal save a small hiatal hernia.      The examined duodenum was normal.      The cardia and gastric fundus were normal on retroflexion. Impression:               1. Esophageal web and distal stricture status post                            dilation                           2. GERD                           3. Otherwise normal EGD. Moderate Sedation:  none Recommendation:           1. Post dilation diet. N.p.o. for 1 hour, then                            clear liquids for 2 hours, then soft foods till                            a.m. Resume regular diet tomorrow                           2. Continue current medications                           3. Reflux precautions                           4. Contact Dr. Henrene Pastor for any problems with                            persistent or recurrent swallowing difficulty Procedure Code(s):        --- Professional ---                           [phone], Esophagogastroduodenoscopy, flexible,                            transoral; with transendoscopic balloon dilation of                            esophagus (less than 30 mm diameter) Diagnosis Code(s):        --- Professional ---                            K22.2, Esophageal obstruction                           R13.10, Dysphagia, unspecified                           K21.9, Gastro-esophageal reflux disease without                            esophagitis CPT copyright 2019 American Medical Association. All rights reserved. The codes documented in this report are preliminary and upon coder review may  be revised to meet current compliance requirements. Docia Chuck. Henrene Pastor, MD 12/29/2019 10:21:46 AM This report has been signed electronically. Number of Addenda: 0

## 2019-12-29 NOTE — Telephone Encounter (Signed)
Santiago Glad from [facility] is requesting a consent order to be sent since the pt is having the colonoscopy at [facility] this morning.  [phone]

## 2019-12-29 NOTE — Discharge Instructions (Signed)
YOU HAD AN ENDOSCOPIC PROCEDURE TODAY: Refer to the procedure report and other information in the discharge instructions given to you for any specific questions about what was found during the examination. If this information does not answer your questions, please call [place] office at [phone] to clarify.  ° °YOU SHOULD EXPECT: Some feelings of bloating in the abdomen. Passage of more gas than usual. Walking can help get rid of the air that was put into your GI tract during the procedure and reduce the bloating. If you had a lower endoscopy (such as a colonoscopy or flexible sigmoidoscopy) you may notice spotting of blood in your stool or on the toilet paper. Some abdominal soreness may be present for a day or two, also. ° °DIET: Your first meal following the procedure should be a light meal and then it is ok to progress to your normal diet. A half-sandwich or bowl of soup is an example of a good first meal. Heavy or fried foods are harder to digest and may make you feel nauseous or bloated. Drink plenty of fluids but you should avoid alcoholic beverages for 24 hours. If you had a esophageal dilation, please see attached instructions for diet.   ° °ACTIVITY: Your care partner should take you home directly after the procedure. You should plan to take it easy, moving slowly for the rest of the day. You can resume normal activity the day after the procedure however YOU SHOULD NOT DRIVE, use power tools, machinery or perform tasks that involve climbing or major physical exertion for 24 hours (because of the sedation medicines used during the test).  ° °SYMPTOMS TO REPORT IMMEDIATELY: °A gastroenterologist can be reached at any hour. Please call [phone]  for any of the following symptoms:  °Following lower endoscopy (colonoscopy, flexible sigmoidoscopy) °Excessive amounts of blood in the stool  °Significant tenderness, worsening of abdominal pains  °Swelling of the abdomen that is new, acute  °Fever of 100° or  higher  °Following upper endoscopy (EGD, EUS, ERCP, esophageal dilation) °Vomiting of blood or coffee ground material  °New, significant abdominal pain  °New, significant chest pain or pain under the shoulder blades  °Painful or persistently difficult swallowing  °New shortness of breath  °Black, tarry-looking or red, bloody stools ° °FOLLOW UP:  °If any biopsies were taken you will be contacted by phone or by letter within the next 1-3 weeks. Call [phone]  if you have not heard about the biopsies in 3 weeks.  °Please also call with any specific questions about appointments or follow up tests. ° °

## 2019-12-29 NOTE — Op Note (Signed)
[facility] Patient Name: Jodi Keller Procedure Date: 12/29/2019 MRN: [id number] Attending MD: Docia Chuck. Henrene Pastor , MD Date of Birth: 08/09/1955 CSN: [id number] Age: 64 Admit Type: Outpatient Procedure:                Colonoscopy Indications:              Screening for colorectal malignant neoplasm Providers:                Docia Chuck. Henrene Pastor, MD, Clyde Lundborg, RN, Cletis Athens,                            Technician Referring MD:             Cathlean Cower, MD Medicines:                Monitored Anesthesia Care Complications:            No immediate complications. Estimated blood loss:                            None. Estimated Blood Loss:     Estimated blood loss: none. Procedure:                Pre-Anesthesia Assessment:                           - Prior to the procedure, a History and Physical                            was performed, and patient medications and                            allergies were reviewed. The patient's tolerance of                            previous anesthesia was also reviewed. The risks                            and benefits of the procedure and the sedation                            options and risks were discussed with the patient.                            All questions were answered, and informed consent                            was obtained. Prior Anticoagulants: The patient has                            taken no previous anticoagulant or antiplatelet                            agents. ASA Grade Assessment: II - A patient with  mild systemic disease. After reviewing the risks                            and benefits, the patient was deemed in                            satisfactory condition to undergo the procedure.                           After obtaining informed consent, the colonoscope                            was passed under direct vision. Throughout the                            procedure, the patient's  blood pressure, pulse, and                            oxygen saturations were monitored continuously. The                            CF-HQ190L ([id number] Olympus colonoscope was                            introduced through the anus and advanced to the the                            cecum, identified by appendiceal orifice and                            ileocecal valve. The terminal ileum, ileocecal                            valve, appendiceal orifice, and rectum were                            photographed. The quality of the bowel preparation                            was excellent. The colonoscopy was performed                            without difficulty. The patient tolerated the                            procedure well. The bowel preparation used was                            SUPREP via split dose instruction. Scope In: 9:30:02 AM Scope Out: 9:45:36 AM Total Procedure Duration: 0 hours 15 minutes 34 seconds  Findings:      The terminal ileum appeared normal.      The entire examined colon appeared normal on direct and retroflexion       views. Impression:               -  The examined portion of the ileum was normal.                           - The entire examined colon is normal on direct and                            retroflexion views.                           - No specimens collected. Moderate Sedation:      none Recommendation:           - Repeat colonoscopy in 10 years for screening                            purposes.                           - Patient has a contact number available for                            emergencies. The signs and symptoms of potential                            delayed complications were discussed with the                            patient. Return to normal activities tomorrow.                            Written discharge instructions were provided to the                            patient.                           - Resume previous  diet.                           - Continue present medications. Procedure Code(s):        --- Professional ---                           [phone], Colonoscopy, flexible; diagnostic, including                            collection of specimen(s) by brushing or washing,                            when performed (separate procedure) Diagnosis Code(s):        --- Professional ---                           Z12.11, Encounter for screening for malignant                            neoplasm of colon CPT copyright 2019  [employer]. All rights reserved. The codes documented in this report are preliminary and upon coder review may  be revised to meet current compliance requirements. Docia Chuck. Henrene Pastor, MD 12/29/2019 10:15:35 AM This report has been signed electronically. Number of Addenda: 0

## 2019-12-29 NOTE — Telephone Encounter (Signed)
Spoke with pt and let her know she needs to proceed to the ER to be evaluated. Pt aware and verbalized understanding.

## 2019-12-29 NOTE — Telephone Encounter (Signed)
Yes, this issue requires urgent evaluation

## 2019-12-29 NOTE — Telephone Encounter (Signed)
She had dilation today for esophageal stricture.  Post dilation inspection revealed an expected mucosal wrent but no obvious perforation.  However, since she is having significant pain, I recommend a Gastrografin swallow ASAP to rule out subtle perforation post dilation.  She should keep her self n.p.o. until that has been completed.  Thanks.

## 2019-12-29 NOTE — Anesthesia Postprocedure Evaluation (Signed)
Anesthesia Post Note  Patient: Jodi Keller  Procedure(s) Performed: COLONOSCOPY WITH PROPOFOL (N/A ) ESOPHAGOGASTRODUODENOSCOPY (EGD) WITH PROPOFOL (N/A ) BALLOON DILATION (N/A )     Patient location during evaluation: Endoscopy Anesthesia Type: MAC Level of consciousness: awake and alert Pain management: pain level controlled Vital Signs Assessment: post-procedure vital signs reviewed and stable Respiratory status: spontaneous breathing, nonlabored ventilation, respiratory function stable and patient connected to nasal cannula oxygen Cardiovascular status: blood pressure returned to baseline and stable Postop Assessment: no apparent nausea or vomiting Anesthetic complications: no   No complications documented.  Last Vitals:  Vitals:   12/29/19 0810 12/29/19 1004  BP: (!) 156/77 111/75  Pulse: 91 69  Resp: (!) 21 20  Temp: 36.7 C 36.6 C  SpO2: 100% 98%    Last Pain:  Vitals:   12/29/19 1004  TempSrc: Axillary  PainSc: [name]

## 2019-12-29 NOTE — Telephone Encounter (Signed)
Pt had ECL done at the hospital today. She states now she is home and that it hurts to swallow, not so much in her throat but between her breasts. She is also having some pressure there and states that the pain and pressure are constant, not coming and going. Please advise.

## 2019-12-29 NOTE — ED Notes (Signed)
Pt turned in labels and sts she will follow up with her PCP tom

## 2019-12-29 NOTE — Transfer of Care (Signed)
Immediate Anesthesia Transfer of Care Note  Patient: Jodi Keller  Procedure(s) Performed: Procedure(s): COLONOSCOPY WITH PROPOFOL (N/A) ESOPHAGOGASTRODUODENOSCOPY (EGD) WITH PROPOFOL (N/A) BALLOON DILATION (N/A)  Patient Location: PACU and Endoscopy Unit  Anesthesia Type:MAC  Level of Consciousness: awake, alert  and oriented  Airway & Oxygen Therapy: Patient Spontanous Breathing and Patient connected to nasal cannula oxygen  Post-op Assessment: Report given to RN and Post -op Vital signs reviewed and stable  Post vital signs: Reviewed and stable  Last Vitals:  Vitals:   12/29/19 0810  BP: (!) 156/77  Pulse: 91  Resp: (!) 21  Temp: 36.7 C  SpO2: 938%    Complications: No apparent anesthesia complications

## 2019-12-29 NOTE — Telephone Encounter (Signed)
Consent is in for pt.

## 2019-12-30 ENCOUNTER — Ambulatory Visit (HOSPITAL_COMMUNITY)
Admission: RE | Admit: 2019-12-30 | Discharge: 2019-12-30 | Disposition: A | Discharge status: Home / Self Care | Payer: Managed Care, Other (non HMO) | Source: Ambulatory Visit | Attending: Internal Medicine | Admitting: Internal Medicine

## 2019-12-30 ENCOUNTER — Telehealth: Payer: Self-pay

## 2019-12-30 DIAGNOSIS — R1013 Epigastric pain: Secondary | ICD-10-CM | POA: Diagnosis present

## 2019-12-30 MED ORDER — IOHEXOL 300 MG/ML  SOLN
50.0000 mL | Freq: Once | INTRAMUSCULAR | Status: AC | PRN
  Administered 2019-12-30: 50 mL via ORAL

## 2019-12-30 NOTE — Telephone Encounter (Signed)
-----   Message from Irene Shipper, MD sent at 12/30/2019  9:56 AM EST ----- Regarding: Follow-up Jodi Keller,Please follow-up with [name] and see how she is feeling.  It looks like she went to the emergency room but I did not see a completed note or radiology studies.  Thanks.JP

## 2019-12-30 NOTE — Telephone Encounter (Signed)
Okay.  Thanks for the follow-up

## 2019-12-30 NOTE — Telephone Encounter (Signed)
Pt states she went to the ER and there was an 8 hour wait so she did not stay. She says she may be a little better but she is going to the xray appt we scheduled at 11am today at [facility]. Let her know we will notify her with the results once we have them. Dr. Henrene Pastor notified.

## 2020-01-02 ENCOUNTER — Encounter (HOSPITAL_COMMUNITY): Payer: Self-pay | Admitting: Internal Medicine

## 2020-01-02 NOTE — Telephone Encounter (Signed)
Not sure why she is having diarrhea at this point.  I would not relate that to the procedures.  She may have a viral gastroenteritis.  If she is not having bleeding, fevers, or significant abdominal pain, okay to use Imodium several times per day and a bland diet.

## 2020-01-02 NOTE — Telephone Encounter (Signed)
Pt states since her EGD/Colon earlier this week she just has not felt good. States she has had some nausea and everything she eats goes right through her. She is having diarrhea, has taken Imodium and pepto but states this is not helping. Please advise.

## 2020-01-02 NOTE — Telephone Encounter (Signed)
Spoke with pt and she is aware. Her bottom is sore from the colon, recommended she try wet wipes and desitin.

## 2020-01-02 NOTE — Telephone Encounter (Signed)
Patient called states she is not well has diarrhea and I snot sure if that is normal seeking advise

## 2020-01-24 HISTORY — PX: ANKLE FUSION: SHX881

## 2020-03-12 ENCOUNTER — Other Ambulatory Visit: Payer: Self-pay | Admitting: Obstetrics and Gynecology

## 2020-03-12 DIAGNOSIS — D649 Anemia, unspecified: Secondary | ICD-10-CM | POA: Insufficient documentation

## 2020-03-29 DIAGNOSIS — E119 Type 2 diabetes mellitus without complications: Secondary | ICD-10-CM | POA: Insufficient documentation

## 2020-03-31 DIAGNOSIS — M87071 Idiopathic aseptic necrosis of right ankle: Secondary | ICD-10-CM | POA: Insufficient documentation

## 2020-04-05 DIAGNOSIS — Z9189 Other specified personal risk factors, not elsewhere classified: Secondary | ICD-10-CM | POA: Insufficient documentation

## 2020-04-06 ENCOUNTER — Encounter: Payer: Self-pay | Admitting: Internal Medicine

## 2020-04-21 DIAGNOSIS — R112 Nausea with vomiting, unspecified: Secondary | ICD-10-CM | POA: Insufficient documentation

## 2020-05-12 DIAGNOSIS — B009 Herpesviral infection, unspecified: Secondary | ICD-10-CM | POA: Insufficient documentation

## 2020-05-12 DIAGNOSIS — N8111 Cystocele, midline: Secondary | ICD-10-CM | POA: Insufficient documentation

## 2020-05-12 DIAGNOSIS — N814 Uterovaginal prolapse, unspecified: Secondary | ICD-10-CM | POA: Insufficient documentation

## 2020-06-12 ENCOUNTER — Other Ambulatory Visit: Payer: Self-pay | Admitting: Internal Medicine

## 2020-06-13 ENCOUNTER — Other Ambulatory Visit: Payer: Self-pay | Admitting: Internal Medicine

## 2020-06-13 NOTE — Telephone Encounter (Signed)
Please refill as per office routine med refill policy (all routine meds refilled for 3 mo or monthly per pt preference up to one year from last visit, then month to month grace period for 3 mo, then further med refills will have to be denied)  

## 2020-06-25 ENCOUNTER — Telehealth: Payer: Self-pay | Admitting: Internal Medicine

## 2020-06-25 NOTE — Telephone Encounter (Signed)
[facility] [phone]  Service being delayed until Monday for skilled nursing and physical therapy

## 2020-06-28 ENCOUNTER — Telehealth: Payer: Self-pay | Admitting: Internal Medicine

## 2020-06-28 NOTE — Telephone Encounter (Signed)
Jodi Keller w/ [facility] is requesting PT verbals for 2w4 and 1w2. Please advise    Okay to LVM: [phone]

## 2020-06-28 NOTE — Telephone Encounter (Signed)
Ok for verbals 

## 2020-06-29 NOTE — Telephone Encounter (Signed)
Verbals given to Romie Minus!

## 2020-07-12 ENCOUNTER — Encounter: Payer: Self-pay | Admitting: Internal Medicine

## 2020-07-12 ENCOUNTER — Telehealth: Payer: Self-pay | Admitting: Internal Medicine

## 2020-07-12 NOTE — Telephone Encounter (Signed)
Molly with [facility] is requesting OT  referral for the patient. She said that both the home health services that the patient has cannot offer OT. Please advise   Phone: [phone] Ext. [phone]

## 2020-07-12 NOTE — Telephone Encounter (Signed)
Ok but I need a reason to do this, as I cant see why right off from the chart  ? Maybe gait disorder after recent wound treatment

## 2020-07-13 MED ORDER — NYSTATIN 100000 UNIT/GM EX CREA: 1.0000 "application " | TOPICAL_CREAM | Freq: Two times a day (BID) | CUTANEOUS | 3 refills | Status: DC

## 2020-07-13 MED ORDER — NYSTATIN 100000 UNIT/GM EX POWD: 1.0000 "application " | Freq: Three times a day (TID) | CUTANEOUS | 3 refills | Status: DC

## 2020-07-15 NOTE — Telephone Encounter (Signed)
Have been on hold for 89mins no one have answered the phone at [facility] will try back at a later date.  [phone]

## 2020-07-22 ENCOUNTER — Encounter (HOSPITAL_BASED_OUTPATIENT_CLINIC_OR_DEPARTMENT_OTHER): Payer: Managed Care, Other (non HMO) | Attending: Internal Medicine | Admitting: Internal Medicine

## 2020-07-22 ENCOUNTER — Other Ambulatory Visit: Payer: Self-pay

## 2020-07-22 DIAGNOSIS — T8131XS Disruption of external operation (surgical) wound, not elsewhere classified, sequela: Secondary | ICD-10-CM | POA: Diagnosis present

## 2020-07-22 DIAGNOSIS — Y838 Other surgical procedures as the cause of abnormal reaction of the patient, or of later complication, without mention of misadventure at the time of the procedure: Secondary | ICD-10-CM | POA: Insufficient documentation

## 2020-07-22 DIAGNOSIS — L97518 Non-pressure chronic ulcer of other part of right foot with other specified severity: Secondary | ICD-10-CM | POA: Diagnosis not present

## 2020-07-22 DIAGNOSIS — E11622 Type 2 diabetes mellitus with other skin ulcer: Secondary | ICD-10-CM | POA: Insufficient documentation

## 2020-07-22 NOTE — Progress Notes (Signed)
Jodi Keller, Jodi Keller ([id number]) Visit Report for 07/22/2020 Chief Complaint Document Details Patient Name: Date of Service: Jodi Keller, Jodi Keller 07/22/2020 9:00 A M Medical Record Number: [id number] Patient Account Number: [id number] Date of Birth/Sex: Treating RN: Jodi [birth year] (65 y.o. Jodi Keller Primary Care Provider: Cathlean Keller Other Clinician: Referring Provider: Treating Provider/Extender: Jodi Keller in Treatment: 0 Information Obtained from: Patient Chief Complaint 07/22/2020; patient is here for review of a nonhealing surgical wound on the medial right foot Electronic Signature(s) Signed: 07/22/2020 5:19:12 PM By: Linton Ham MD Entered By: Linton Ham on 07/22/2020 10:48:52 -------------------------------------------------------------------------------- Debridement Details Patient Name: Date of Service: Jodi Friends. 07/22/2020 9:00 A M Medical Record Number: [id number] Patient Account Number: [id number] Date of Birth/Sex: Treating RN: Jodi [birth year] (65 y.o. Jodi Keller, Jodi Keller Primary Care Provider: Cathlean Keller Other Clinician: Referring Provider: Treating Provider/Extender: Jodi Keller in Treatment: 0 Debridement Performed for Assessment: Wound #1 Right,Medial Foot Performed By: Physician Ricard Dillon., MD Debridement Type: Debridement Level of Consciousness (Pre-procedure): Awake and Alert Pre-procedure Verification/Time Out Yes - 10:25 Taken: Start Time: 10:26 Pain Control: Lidocaine 4% T opical Solution T Area Debrided (L x W): otal 2.4 (cm) x 3 (cm) = 7.2 (cm) Tissue and other material debrided: Viable, Non-Viable, Slough, Subcutaneous, Skin: Dermis , Skin: Epidermis, Fibrin/Exudate, Slough Level: Skin/Subcutaneous Tissue Debridement Description: Excisional Instrument: Curette Bleeding: Minimum Hemostasis Achieved: Pressure End Time: 10:30 Procedural Pain: 0 Post Procedural Pain: 4 Response to Treatment:  Procedure was tolerated well Level of Consciousness (Post- Awake and Alert procedure): Post Debridement Measurements of Total Wound Length: (cm) 2.4 Width: (cm) 3 Depth: (cm) 0.6 Volume: (cm) 3.393 Character of Wound/Ulcer Post Debridement: Requires Further Debridement Post Procedure Diagnosis Same as Pre-procedure Electronic Signature(s) Signed: 07/22/2020 5:19:12 PM By: Linton Ham MD Signed: 07/22/2020 6:57:58 PM By: Deon Pilling Entered By: Linton Ham on 07/22/2020 10:48:26 -------------------------------------------------------------------------------- HPI Details Patient Name: Date of Service: Jodi Friends. 07/22/2020 9:00 A M Medical Record Number: [id number] Patient Account Number: [id number] Date of Birth/Sex: Treating RN: [name] (66 y.o. Jodi Keller Primary Care Provider: Cathlean Keller Other Clinician: Referring Provider: Treating Provider/Extender: Jodi Keller in Treatment: 0 History of Present Illness HPI Description: ADMISSION 07/22/2020 This is a 65 year old woman who is here accompanied by her sister-in-law. Her problem was avascular necrosis of the right talus and for a long period of time was walking with a great deal of pain on the outside of her foot. After consulting several local doctors and podiatrist who would not consider her for surgery she was referred to orthopedics at [facility]. On 03/31/2020 she underwent an arthrodesis of the right ankle and an Achilles tenotomy. She apparently had stem cell harvest and infusion as part of this. She had surgeries on the lateral and medial part of her foot the lateral wound is healed she has been left with an open area which I think was a surgical wound dehiscence on the medial ankle. She was referred here by orthopedics at [facility] for ongoing wound care. She was put in a cam boot which she is still using in Jodi. She also had some form of medicated wrap that she developed an acute allergic  response to. The only thing we could see in looking through care everywhere was no allergy to zinc. She currently is using some form of silver impregnated Mepilex border. She has home health changing this. She had an x-ray done at [facility]. This showed an unchanged appearance  of the collapsed talus similar mild talonavicular and calcaneocuboid joint osteoarthritis. Past medical history includes type 2 diabetes although the patient denies this she was told at [facility] that she was a diabetic. She is not currently on any treatment. She also has hidradenitis listed but she denies this as well stating she does not even know what this is. She has gastroesophageal reflux disease hypertension. ABI in our clinic was 1.25 on the right Electronic Signature(s) Signed: 07/22/2020 5:19:12 PM By: Linton Ham MD Entered By: Linton Ham on 07/22/2020 10:52:28 -------------------------------------------------------------------------------- Physical Exam Details Patient Name: Date of Service: Jodi Friends. 07/22/2020 9:00 A M Medical Record Number: [id number] Patient Account Number: [id number] Date of Birth/Sex: Treating RN: [name] (65 y.o. Jodi Keller Primary Care Provider: Cathlean Keller Other Clinician: Referring Provider: Treating Provider/Extender: Jodi Keller in Treatment: 0 Constitutional Sitting or standing Blood Pressure is within target range for patient.. Pulse regular and within target range for patient.[name] Respirations regular, non-labored and within target range.. Temperature is normal and within the target range for the patient.[name] Appears in no distress. Cardiovascular Pedal pulses are normal on the right both dorsalis pedis and posterior tibial. She appears to have some changes of chronic venous insufficiency with mild edema in her leg. Notes Wound exam; the wound is on the medial part of her right foot. This is 100% covered in a very fibrinous adherent surface  slough. There is an area in the middle of this that appears to probe to bone. I used a #5 curette to remove the surface material opening down into subcutaneous tissue. Post debridement there was no purulence coming out of the deeper area no surrounding and erythema. In fact surrounding the wound there is some epithelialization. No swelling Electronic Signature(s) Signed: 07/22/2020 5:19:12 PM By: Linton Ham MD Entered By: Linton Ham on 07/22/2020 10:54:01 -------------------------------------------------------------------------------- Physician Orders Details Patient Name: Date of Service: Jodi Friends. 07/22/2020 9:00 A M Medical Record Number: [id number] Patient Account Number: [id number] Date of Birth/Sex: Treating RN: Jodi Keller, Jodi Keller (65 y.o. Jodi Keller Primary Care Provider: Cathlean Keller Other Clinician: Referring Provider: Treating Provider/Extender: Jodi Keller in Treatment: 0 Verbal / Phone Orders: No Diagnosis Coding Follow-up Appointments ppointment in 1 week. - Dr. Dellia Nims Return A Bathing/ Shower/ Hygiene May shower and wash wound with soap and water. - with dressing changes. Edema Control - Lymphedema / SCD / Other Elevate legs to the level of the heart or above for 30 minutes daily and/or when sitting, a frequency of: - throughout the day. Avoid standing for long periods of time. Home Health Wound #1 Right,Medial Foot New wound care orders this week; continue Home Health for wound care. May utilize formulary equivalent dressing for wound treatment orders unless otherwise specified. - Interim Home Health to change once a week. iodoflex primary dressing with abd pad secondary with a 3 layer compression wrap. Wound Treatment Wound #1 - Foot Wound Laterality: Right, Medial Cleanser: Soap and Water (Home Health) 3 x Per Week/30 Days Discharge Instructions: May shower and wash wound with dial antibacterial soap and water prior to dressing  change. Cleanser: Wound Cleanser (Home Health) 3 x Per Week/30 Days Discharge Instructions: Cleanse the wound with wound cleanser prior to applying a clean dressing using gauze sponges, not tissue or cotton balls. Peri-Wound Care: Sween Lotion (Moisturizing lotion) (Home Health) 3 x Per Week/30 Days Discharge Instructions: Apply moisturizing lotion as directed Prim Dressing: IODOFLEX 0.9% Cadexomer Iodine Pad 4x6 cm (Home  Health) 3 x Per Week/30 Days ary Discharge Instructions: ensure to get down into wound bed and depth. Secondary Dressing: Woven Gauze Sponge, Non-Sterile 4x4 in (Home Health) 3 x Per Week/30 Days Discharge Instructions: Apply over primary dressing as directed. Secondary Dressing: ABD Pad, 8x10 (Home Health) 3 x Per Week/30 Days Discharge Instructions: Apply over primary dressing as directed. Compression Wrap: ThreePress (3 layer compression wrap) (Home Health) 3 x Per Week/30 Days Discharge Instructions: Apply three layer compression as directed. Patient Medications llergies: No Known Allergies A Notifications Medication Indication Start End lidocaine DOSE topical 5 % gel - gel topical applied prior to debridement only in clinic. Electronic Signature(s) Signed: 07/22/2020 5:19:12 PM By: Linton Ham MD Signed: 07/22/2020 6:57:58 PM By: Deon Pilling Entered By: Deon Pilling on 07/22/2020 10:31:14 Prescription 07/22/2020 -------------------------------------------------------------------------------- Golden Circle MD Patient Name: Provider: [birth year] [id number] Date of Birth: NPI#Wanda Plump [id number] Sex: DEA #: [phone] Phone #: License #: [place] Patient Address: [address] [place], [place] [address] [place], [place] [address] [phone] Allergies No Known Allergies Medication Medication: Route: Strength: Form: lidocaine topical 5%  gel Class: TOPICAL LOCAL ANESTHETICS Dose: Frequency / Time: Indication: gel topical applied prior to debridement only in clinic. Number of Refills: Number of Units: 0 Generic Substitution: Start Date: End Date: Administered at Facility: Substitution Permitted Yes Time Administered: Time Discontinued: Note to Pharmacy: Hand Signature: Date(s): Electronic Signature(s) Signed: 07/22/2020 5:19:12 PM By: Linton Ham MD Signed: 07/22/2020 6:57:58 PM By: Deon Pilling Entered By: Deon Pilling on 07/22/2020 10:31:14 -------------------------------------------------------------------------------- Problem List Details Patient Name: Date of Service: Jodi Friends. 07/22/2020 9:00 A M Medical Record Number: [id number] Patient Account Number: [id number] Date of Birth/Sex: Treating RN: [birth year] Jodi Keller (65 y.o. Jodi Keller, Tammi Klippel Primary Care Provider: Cathlean Keller Other Clinician: Referring Provider: Treating Provider/Extender: Jodi Keller in Treatment: 0 Active Problems ICD-10 Encounter Code Description Active Date MDM Diagnosis T81.31XS Disruption of external operation (surgical) wound, not elsewhere classified, 07/22/2020 No Yes sequela L97.518 Non-pressure chronic ulcer of other part of right foot with other specified 07/22/2020 No Yes severity E11.622 Type 2 diabetes mellitus with other skin ulcer 07/22/2020 No Yes Inactive Problems Resolved Problems Electronic Signature(s) Signed: 07/22/2020 5:19:12 PM By: Linton Ham MD Entered By: Linton Ham on 07/22/2020 10:48:02 -------------------------------------------------------------------------------- Progress Note Details Patient Name: Date of Service: Jodi Friends. 07/22/2020 9:00 A M Medical Record Number: [id number] Patient Account Number: [id number] Date of Birth/Sex: Treating RN: Jodi Keller[birth year] (65 y.o. Jodi Keller Primary Care Provider: Cathlean Keller Other Clinician: Referring  Provider: Treating Provider/Extender: Jodi Keller in Treatment: 0 Subjective Chief Complaint Information obtained from Patient 07/22/2020; patient is here for review of a nonhealing surgical wound on the medial right foot History of Present Illness (HPI) ADMISSION 07/22/2020 This is a 65 year old woman who is here accompanied by her sister-in-law. Her problem was avascular necrosis of the right talus and for a long period of time was walking with a great deal of pain on the outside of her foot. After consulting several local doctors and podiatrist who would not consider her for surgery she was referred to orthopedics at [facility]. On 03/31/2020 she underwent an arthrodesis of the right ankle and an Achilles tenotomy. She apparently had stem cell harvest and infusion as part of this. She had surgeries on the lateral and medial part of her foot the lateral wound is healed  she has been left with an open area which I think was a surgical wound dehiscence on the medial ankle. She was referred here by orthopedics at [facility] for ongoing wound care. She was put in a cam boot which she is still using in Jodi. She also had some form of medicated wrap that she developed an acute allergic response to. The only thing we could see in looking through care everywhere was no allergy to zinc. She currently is using some form of silver impregnated Mepilex border. She has home health changing this. She had an x-ray done at [facility]. 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 Past medical history includes type 2 diabetes although the patient denies this she was told at [facility] that she was a diabetic. She is not currently on any treatment. She also has hidradenitis listed but she denies this as well stating she does not even know what this is. She has gastroesophageal reflux disease hypertension. ABI in our clinic was 1.25 on  the right Patient History Information obtained from Patient. Allergies No Known Allergies Family History Cancer - Siblings,Father, Diabetes - Paternal Grandparents, Thyroid Problems - Siblings, No family history of Heart Disease, Hereditary Spherocytosis, Hypertension, Kidney Disease, Lung Disease, Seizures, Stroke, Tuberculosis. Social History Never smoker, Marital Status - Divorced, Alcohol Use - Rarely, Drug Use - No History, Caffeine Use - Daily - coffee. Medical History Eyes Patient has history of Cataracts - right eye removed Denies history of Glaucoma, Optic Neuritis Ear/Nose/Mouth/Throat Denies history of Chronic sinus problems/congestion, Middle ear problems Hematologic/Lymphatic Patient has history of Anemia - hx Cardiovascular Patient has history of Hypertension Endocrine Patient has history of Type II Diabetes Denies history of Type I Diabetes Genitourinary Denies history of End Stage Renal Disease Immunological Denies history of Lupus Erythematosus, Raynaudoos, Scleroderma Integumentary (Skin) Denies history of History of Burn Musculoskeletal Patient has history of Osteoarthritis Oncologic Denies history of Received Chemotherapy, Received Radiation Psychiatric Denies history of Anorexia/bulimia, Confinement Anxiety Patient is treated with Controlled Diet. Hospitalization/Surgery History - right foot subtalar fusion. - c-section. - cataracts removed. - left foot corrective surgery. - tenotomy percutaneous achilles tendon. Medical A Surgical History Notes nd Constitutional Symptoms (General Health) morbid obesity Cardiovascular hyperlipidemia Gastrointestinal GERD Musculoskeletal avascular necrosis of right talus Review of Systems (ROS) Constitutional Symptoms (General Health) Denies complaints or symptoms of Fatigue, Fever, Chills, Marked Weight Change. Eyes Complains or has symptoms of Glasses / Contacts - reading. Denies complaints or symptoms of  Dry Eyes, Vision Changes. Ear/Nose/Mouth/Throat Denies complaints or symptoms of Chronic sinus problems or rhinitis. Respiratory Complains or has symptoms of Shortness of Breath - with exertion. Denies complaints or symptoms of Chronic or frequent coughs. Cardiovascular Denies complaints or symptoms of Chest pain. Gastrointestinal Denies complaints or symptoms of Frequent diarrhea, Nausea, Vomiting. Endocrine Denies complaints or symptoms of Heat/cold intolerance. Genitourinary Denies complaints or symptoms of Frequent urination. Integumentary (Skin) Complains or has symptoms of Wounds - right foot. Musculoskeletal Denies complaints or symptoms of Muscle Pain, Muscle Weakness. Neurologic Denies complaints or symptoms of Numbness/parasthesias. Psychiatric Denies complaints or symptoms of Claustrophobia, Suicidal. Objective Constitutional Sitting or standing Blood Pressure is within target range for patient.. Pulse regular and within target range for patient.[name] Respirations regular, non-labored and within target range.. Temperature is normal and within the target range for the patient.[name] Appears in no distress. Vitals Time Taken: 9:29 AM, Height: 67 in, Source: Stated, Temperature: 98.3 F, Pulse: 105 bpm, Respiratory Rate: Keller  breaths/min, Blood Pressure: 126/75 mmHg. Cardiovascular Pedal pulses are normal on the right both dorsalis pedis and posterior tibial. She appears to have some changes of chronic venous insufficiency with mild edema in her leg. General Notes: Wound exam; the wound is on the medial part of her right foot. This is 100% covered in a very fibrinous adherent surface slough. There is an area in the middle of this that appears to probe to bone. I used a #5 curette to remove the surface material opening down into subcutaneous tissue. Post debridement there was no purulence coming out of the deeper area no surrounding and erythema. In fact surrounding the wound there is  some epithelialization. No swelling Integumentary (Hair, Skin) Wound #1 status is Open. Original cause of wound was Surgical Injury. The date acquired was: 03/31/2020. The wound is located on the Right,Medial Foot. The wound measures 2.4cm length x 3cm width x 0.6cm depth; 5.655cm^2 area and 3.393cm^3 volume. There is bone and Fat Layer (Subcutaneous Tissue) exposed. There is no tunneling or undermining noted. There is a medium amount of serosanguineous drainage noted. The wound margin is flat and intact. There is medium (34-66%) red granulation within the wound bed. There is a medium (34-66%) amount of necrotic tissue within the wound bed including Adherent Slough. Assessment Active Problems ICD-10 Disruption of external operation (surgical) wound, not elsewhere classified, sequela Non-pressure chronic ulcer of other part of right foot with other specified severity Type 2 diabetes mellitus with other skin ulcer Procedures Wound #1 Pre-procedure diagnosis of Wound #1 is an Open Surgical Wound located on the Right,Medial Foot . There was a Excisional Skin/Subcutaneous Tissue Debridement with a total area of 7.2 sq cm performed by Ricard Dillon., MD. With the following instrument(s): Curette to remove Viable and Non-Viable tissue/material. Material removed includes Subcutaneous Tissue, Slough, Skin: Dermis, Skin: Epidermis, and Fibrin/Exudate after achieving pain control using Lidocaine 4% T opical Solution. A time out was conducted at 10:25, prior to the start of the procedure. A Minimum amount of bleeding was controlled with Pressure. The procedure was tolerated well with a pain level of 0 throughout and a pain level of 4 following the procedure. Post Debridement Measurements: 2.4cm length x 3cm width x 0.6cm depth; 3.393cm^3 volume. Character of Wound/Ulcer Post Debridement requires further debridement. Post procedure Diagnosis Wound #1: Same as Pre-Procedure Pre-procedure diagnosis of  Wound #1 is an Open Surgical Wound located on the Right,Medial Foot . There was a Three Layer Compression Therapy Procedure with a pre-treatment ABI of 1.2 by Leane Call, RN. Post procedure Diagnosis Wound #1: Same as Pre-Procedure Plan Follow-up Appointments: Return Appointment in 1 week. - Dr. Dellia Nims Bathing/ Shower/ Hygiene: May shower and wash wound with soap and water. - with dressing changes. Edema Control - Lymphedema / SCD / Other: Elevate legs to the level of the heart or above for 30 minutes daily and/or when sitting, a frequency of: - throughout the day. Avoid standing for long periods of time. Home Health: Wound #1 Right,Medial Foot: New wound care orders this week; continue Home Health for wound care. May utilize formulary equivalent dressing for wound treatment orders unless otherwise specified. - Interim Home Health to change once a week. iodoflex primary dressing with abd pad secondary with a 3 layer compression wrap. The following medication(s) was prescribed: lidocaine topical 5 % gel gel topical applied prior to debridement only in clinic. was prescribed at facility WOUND #1: - Foot Wound Laterality: Right, Medial Cleanser: Soap and Water (Home Health)  3 x Per Week/30 Days Discharge Instructions: May shower and wash wound with dial antibacterial soap and water prior to dressing change. Cleanser: Wound Cleanser (Home Health) 3 x Per Week/30 Days Discharge Instructions: Cleanse the wound with wound cleanser prior to applying a clean dressing using gauze sponges, not tissue or cotton balls. Peri-Wound Care: Sween Lotion (Moisturizing lotion) (Home Health) 3 x Per Week/30 Days Discharge Instructions: Apply moisturizing lotion as directed Prim Dressing: IODOFLEX 0.9% Cadexomer Iodine Pad 4x6 cm (Home Health) 3 x Per Week/30 Days ary Discharge Instructions: ensure to get down into wound bed and depth. Secondary Dressing: Woven Gauze Sponge, Non-Sterile 4x4 in (Home  Health) 3 x Per Week/30 Days Discharge Instructions: Apply over primary dressing as directed. Secondary Dressing: ABD Pad, 8x10 (Home Health) 3 x Per Week/30 Days Discharge Instructions: Apply over primary dressing as directed. Com pression Wrap: ThreePress (3 layer compression wrap) (Home Health) 3 x Per Week/30 Days Discharge Instructions: Apply three layer compression as directed. 1. After debridement I change the primary dressing to Iodoflex which I hope to help with the debridement of the wound surface this is also antibacterial 2. Gauze and ABDs and I put her in 3 layer compression in case there is venous hypertension in this area. 3. We have home health changing this once we will see her back next week 4. I did not fully explore the central probing area I did not culture this. This is somewhat concerning as it appears to probe to bone rather than hardware. I spent 45 minutes in review of this patient's past medical history, surgical history, face-to-face evaluation and preparation of this record Electronic Signature(s) Signed: 07/22/2020 5:19:12 PM By: Linton Ham MD Entered By: Linton Ham on 07/22/2020 10:55:51 -------------------------------------------------------------------------------- HxROS Details Patient Name: Date of Service: Jodi Friends. 07/22/2020 9:00 A M Medical Record Number: [id number] Patient Account Number: [id number] Date of Birth/Sex: Treating RN: [birth year] (65 y.o. Elam Dutch Primary Care Provider: Cathlean Keller Other Clinician: Referring Provider: Treating Provider/Extender: Jodi Keller in Treatment: 0 Information Obtained From Patient Constitutional Symptoms (General Health) Complaints and Symptoms: Negative for: Fatigue; Fever; Chills; Marked Weight Change Medical History: Past Medical History Notes: morbid obesity Eyes Complaints and Symptoms: Positive for: Glasses / Contacts - reading Negative for: Dry Eyes;  Vision Changes Medical History: Positive for: Cataracts - right eye removed Negative for: Glaucoma; Optic Neuritis Ear/Nose/Mouth/Throat Complaints and Symptoms: Negative for: Chronic sinus problems or rhinitis Medical History: Negative for: Chronic sinus problems/congestion; Middle ear problems Respiratory Complaints and Symptoms: Positive for: Shortness of Breath - with exertion Negative for: Chronic or frequent coughs Cardiovascular Complaints and Symptoms: Negative for: Chest pain Medical History: Positive for: Hypertension Past Medical History Notes: hyperlipidemia Gastrointestinal Complaints and Symptoms: Negative for: Frequent diarrhea; Nausea; Vomiting Medical History: Past Medical History Notes: GERD Endocrine Complaints and Symptoms: Negative for: Heat/cold intolerance Medical History: Positive for: Type II Diabetes Negative for: Type I Diabetes Treated with: Diet Genitourinary Complaints and Symptoms: Negative for: Frequent urination Medical History: Negative for: End Stage Renal Disease Integumentary (Skin) Complaints and Symptoms: Positive for: Wounds - right foot Medical History: Negative for: History of Burn Musculoskeletal Complaints and Symptoms: Negative for: Muscle Pain; Muscle Weakness Medical History: Positive for: Osteoarthritis Past Medical History Notes: avascular necrosis of right talus Neurologic Complaints and Symptoms: Negative for: Numbness/parasthesias Psychiatric Complaints and Symptoms: Negative for: Claustrophobia; Suicidal Medical History: Negative for: Anorexia/bulimia; Confinement Anxiety Hematologic/Lymphatic Medical History: Positive for: Anemia - hx Immunological Medical History: Negative  for: Lupus Erythematosus; Raynauds; Scleroderma Oncologic Medical History: Negative for: Received Chemotherapy; Received Radiation HBO Extended History Items Eyes: Cataracts Immunizations Pneumococcal Vaccine: Received  Pneumococcal Vaccination: No Implantable Devices Yes Hospitalization / Surgery History Type of Hospitalization/Surgery right foot subtalar fusion c-section cataracts removed left foot corrective surgery tenotomy percutaneous achilles tendon Family and Social History Cancer: Yes - Siblings,Father; Diabetes: Yes - Paternal Grandparents; Heart Disease: No; Hereditary Spherocytosis: No; Hypertension: No; Kidney Disease: No; Lung Disease: No; Seizures: No; Stroke: No; Thyroid Problems: Yes - Siblings; Tuberculosis: No; Never smoker; Marital Status - Divorced; Alcohol Use: Rarely; Drug Use: No History; Caffeine Use: Daily - coffee; Financial Concerns: No; Food, Clothing or Shelter Needs: No; Support System Lacking: No; Transportation Concerns: No Electronic Signature(s) Signed: 07/22/2020 5:19:12 PM By: Linton Ham MD Signed: 07/22/2020 6:41:14 PM By: Baruch Gouty RN, BSN Entered By: Baruch Gouty on 07/22/2020 09:42:42 -------------------------------------------------------------------------------- [place] Details Patient Name: Date of Service: NETASHA, WEHRLI 07/22/2020 Medical Record Number: [id number] Patient Account Number: [id number] Date of Birth/Sex: Treating RN: [birth year], Jodi Keller (64 y.o. Jodi Keller, Jodi Keller Primary Care Provider: Cathlean Keller Other Clinician: Referring Provider: Treating Provider/Extender: Jodi Keller in Treatment: 0 Diagnosis Coding ICD-10 Codes Code Description T81.31XS Disruption of external operation (surgical) wound, not elsewhere classified, sequela L97.518 Non-pressure chronic ulcer of other part of right foot with other specified severity E11.622 Type 2 diabetes mellitus with other skin ulcer Facility Procedures CPT4 Code: 34193790 Description: 99214 - WOUND CARE VISIT-LEV 4 EST PT Modifier: Quantity: 1 CPT4 Code: 24097353 Description: 11042 - DEB SUBQ TISSUE Keller SQ CM/< ICD-10 Diagnosis Description L97.518 Non-pressure  chronic ulcer of other part of right foot with other specified seve Modifier: rity Quantity: 1 Physician Procedures : CPT4 Code Description Modifier [id number] 83419 - WC PHYS LEVEL 4 - NEW PT 25 ICD-10 Diagnosis Description T81.31XS Disruption of external operation (surgical) wound, not elsewhere classified, sequela L97.518 Non-pressure chronic ulcer of other part of  right foot with other specified severity E11.622 Type 2 diabetes mellitus with other skin ulcer Quantity: 1 : [id number] 11042 - WC PHYS SUBQ TISS Keller SQ CM ICD-10 Diagnosis Description L97.518 Non-pressure chronic ulcer of other part of right foot with other specified severity Quantity: 1 Electronic Signature(s) Signed: 07/22/2020 5:19:12 PM By: Linton Ham MD Signed: 07/22/2020 6:57:58 PM By: Deon Pilling Entered By: Deon Pilling on 07/22/2020 13:01:29

## 2020-07-22 NOTE — Progress Notes (Signed)
[name], Jodi Keller ([id number]) Visit Report for 07/22/2020 Abuse/Suicide Risk Screen Details Patient Name: Date of Service: Jodi Keller, Jodi Keller 07/22/2020 9:00 A M Medical Record Number: [id number] Patient Account Number: [id number] Date of Birth/Sex: Treating RN: Dec 08, 1955 (65 y.o. Elam Dutch Primary Care Justino Boze: Cathlean Cower Other Clinician: Referring Ladean Steinmeyer: Treating Jadarrius Maselli/Extender: Jeri Modena in Treatment: 0 Abuse/Suicide Risk Screen Items Answer ABUSE RISK SCREEN: Has anyone close to you tried to hurt or harm you recentlyo No Do you feel uncomfortable with anyone in your familyo No Has anyone forced you do things that you didnt want to doo No Electronic Signature(s) Signed: 07/22/2020 6:41:14 PM By: Baruch Gouty RN, BSN Entered By: Baruch Gouty on 07/22/2020 09:42:54 -------------------------------------------------------------------------------- Activities of Daily Living Details Patient Name: Date of Service: Jodi, Keller 07/22/2020 9:00 A M Medical Record Number: [id number] Patient Account Number: [id number] Date of Birth/Sex: Treating RN: May 20, 1955 (65 y.o. Elam Dutch Primary Care Livi Mcgann: Cathlean Cower Other Clinician: Referring Autry Droege: Treating Benjie Ricketson/Extender: Jeri Modena in Treatment: 0 Activities of Daily Living Items Answer Activities of Daily Living (Please select one for each item) Drive Automobile Not Able T Medications ake Completely Able Use T elephone Completely Able Care for Appearance Completely Able Use T oilet Completely Able Bath / Shower Completely Able Dress Self Completely Able Feed Self Completely Able Walk Need Assistance Get In / Out Bed Completely Able Housework Need Assistance Prepare Meals Completely [place] Completely Able Shop for Self Need Assistance Electronic Signature(s) Signed: 07/22/2020 6:41:14 PM By: Baruch Gouty RN, BSN Entered By:  Baruch Gouty on 07/22/2020 09:43:26 -------------------------------------------------------------------------------- Education Screening Details Patient Name: Date of Service: Jodi Keller. 07/22/2020 9:00 A M Medical Record Number: [id number] Patient Account Number: [id number] Date of Birth/Sex: Treating RN: [birth year] (65 y.o. Elam Dutch Primary Care Ether Goebel: Cathlean Cower Other Clinician: Referring Yiselle Babich: Treating Jeanell Mangan/Extender: Jeri Modena in Treatment: 0 Primary Learner Assessed: Patient Learning Preferences/Education Level/Primary Language Learning Preference: Explanation, Demonstration, Printed Material Highest Education Level: College or Above Preferred Language: English Cognitive Barrier Language Barrier: No Translator Needed: No Memory Deficit: No Emotional Barrier: No Cultural/Religious Beliefs Affecting Medical Care: No Physical Barrier Impaired Vision: Yes Glasses, reading Impaired Hearing: No Decreased Hand dexterity: No Knowledge/Comprehension Knowledge Level: High Comprehension Level: High Ability to understand written instructions: High Ability to understand verbal instructions: High Motivation Anxiety Level: Calm Cooperation: Cooperative Education Importance: Acknowledges Need Interest in Health Problems: Asks Questions Perception: Coherent Willingness to Engage in Self-Management High Activities: Readiness to Engage in Self-Management High Activities: Electronic Signature(s) Signed: 07/22/2020 6:41:14 PM By: Baruch Gouty RN, BSN Entered By: Baruch Gouty on 07/22/2020 09:43:59 -------------------------------------------------------------------------------- Fall Risk Assessment Details Patient Name: Date of Service: Jodi Keller. 07/22/2020 9:00 A M Medical Record Number: [id number] Patient Account Number: [id number] Date of Birth/Sex: Treating RN: Nov 20, 1955 (65 y.o. Elam Dutch Primary  Care Edelmiro Innocent: Cathlean Cower Other Clinician: Referring Ashan Cueva: Treating Anias Bartol/Extender: Jeri Modena in Treatment: 0 Fall Risk Assessment Items Have you had 2 or more falls in the last 12 monthso 0 No Have you had any fall that resulted in injury in the last 12 monthso 0 No FALLS RISK SCREEN History of falling - immediate or within 3 months 0 No Secondary diagnosis (Do you have 2 or more medical diagnoseso) 15 Yes Ambulatory aid None/bed rest/wheelchair/nurse 0 No Crutches/cane/walker 15 Yes Furniture 0 No Intravenous therapy Access/Saline/Heparin Lock 0 No Gait/Transferring Normal/ bed rest/  wheelchair 0 No Weak (short steps with or without shuffle, stooped but able to lift head while walking, may seek 10 Yes support from furniture) Impaired (short steps with shuffle, may have difficulty arising from chair, head down, impaired 0 No balance) Mental Status Oriented to own ability 0 Yes Electronic Signature(s) Signed: 07/22/2020 6:41:14 PM By: Baruch Gouty RN, BSN Entered By: Baruch Gouty on 07/22/2020 09:44:19 -------------------------------------------------------------------------------- Foot Assessment Details Patient Name: Date of Service: Jodi Keller. 07/22/2020 9:00 A M Medical Record Number: [id number] Patient Account Number: [id number] Date of Birth/Sex: Treating RN: 24-Jul-1955 (65 y.o. Elam Dutch Primary Care Paige Monarrez: Cathlean Cower Other Clinician: Referring Annessa Satre: Treating Itzy Adler/Extender: Jeri Modena in Treatment: 0 Foot Assessment Items Site Locations + = Sensation present, - = Sensation absent, C = Callus, U = Ulcer R = Redness, W = Warmth, M = Maceration, PU = Pre-ulcerative lesion F = Fissure, S = Swelling, D = Dryness Assessment Right: Left: Other Deformity: No No Prior Foot Ulcer: No No Prior Amputation: No No Charcot Joint: No No Ambulatory Status: Ambulatory With Help Assistance  Device: Walker GaitEnergy manager) Signed: 07/22/2020 6:41:14 PM By: Baruch Gouty RN, BSN Entered By: Baruch Gouty on 07/22/2020 09:49:46 -------------------------------------------------------------------------------- Nutrition Risk Screening Details Patient Name: Date of Service: Jodi, Keller 07/22/2020 9:00 A M Medical Record Number: [id number] Patient Account Number: [id number] Date of Birth/Sex: Treating RN: Sep 26, 1955 (65 y.o. Elam Dutch Primary Care Viki Carrera: Cathlean Cower Other Clinician: Referring Teckla Christiansen: Treating Berdene Askari/Extender: Jeri Modena in Treatment: 0 Height (in): 67 Weight (lbs): Body Mass Index (BMI): Nutrition Risk Screening Items Score Screening NUTRITION RISK SCREEN: I have an illness or condition that made me change the kind and/or amount of food I eat 0 No I eat fewer than two meals per day 0 No I eat few fruits and vegetables, or milk products 0 No I have three or more drinks of beer, liquor or wine almost every day 0 No I have tooth or mouth problems that make it hard for me to eat 0 No I don't always have enough money to buy the food I need 0 No I eat alone most of the time 1 Yes I take three or more different prescribed or over-the-counter drugs a day 1 Yes Without wanting to, I have lost or gained 10 pounds in the last six months 2 Yes I am not always physically able to shop, cook and/or feed myself 0 No Nutrition Protocols Good Risk Protocol Moderate Risk Protocol 0 Provide education on nutrition High Risk Proctocol Risk Level: Moderate Risk Score: 4 Electronic Signature(s) Signed: 07/22/2020 6:41:14 PM By: Baruch Gouty RN, BSN Entered By: Baruch Gouty on 07/22/2020 09:44:58

## 2020-07-27 NOTE — Progress Notes (Signed)
Jodi Keller, Jodi Keller ([id number]) Visit Report for 07/22/2020 Allergy List Details Patient Name: Date of Service: Jodi Keller, Jodi Keller 07/22/2020 9:00 A M Medical Record Number: [id number] Patient Account Number: [id number] Date of Birth/Sex: Treating RN: 1955-12-20 (65 y.o. Female) Baruch Gouty Primary Care Tiwanda Threats: Cathlean Cower Other Clinician: Referring Kaityln Kallstrom: Treating Suhan Paci/Extender: Jeri Modena in Treatment: 0 Allergies Active Allergies No Known Allergies Allergy Notes Electronic Signature(s) Signed: 07/22/2020 6:41:14 PM By: Baruch Gouty RN, BSN Entered By: Baruch Gouty on 07/22/2020 09:31:31 -------------------------------------------------------------------------------- [place] Information Details Patient Name: Date of Service: Jodi Friends. 07/22/2020 9:00 A M Medical Record Number: [id number] Patient Account Number: [id number] Date of Birth/Sex: Treating RN: 1956-01-05 (65 y.o. Female) Baruch Gouty Primary Care Samariya Rockhold: Cathlean Cower Other Clinician: Referring Kursten Kruk: Treating Joshoa Shawler/Extender: Jeri Modena in Treatment: 0 Visit Information Patient Arrived: Stretcher Arrival Time: 09:22 Accompanied By: sister in law Transfer Assistance: None Patient Identification Verified: Yes Secondary Verification Process Completed: Yes Patient Requires Transmission-Based Precautions: No Patient Has Alerts: No Electronic Signature(s) Signed: 07/22/2020 6:41:14 PM By: Baruch Gouty RN, BSN Entered By: Baruch Gouty on 07/22/2020 09:29:11 -------------------------------------------------------------------------------- Clinic Level of Care Assessment Details Patient Name: Date of Service: Jodi Keller, Jodi Keller 07/22/2020 9:00 A M Medical Record Number: [id number] Patient Account Number: [id number] Date of Birth/Sex: Treating RN: 1956-01-18 (66 y.o. Female) Deon Pilling Primary Care Kirti Carl: Cathlean Cower Other  Clinician: Referring Kimblery Diop: Treating Bryant Saye/Extender: Jeri Modena in Treatment: 0 Clinic Level of Care Assessment Items TOOL 1 Quantity Score X- 1 0 Use when EandM and Procedure is performed on INITIAL visit ASSESSMENTS - Nursing Assessment / Reassessment X- 1 20 General Physical Exam (combine w/ comprehensive assessment (listed just below) when performed on new pt. evals) X- 1 25 Comprehensive Assessment (HX, ROS, Risk Assessments, Wounds Hx, etc.) ASSESSMENTS - Wound and Skin Assessment / Reassessment X- 1 10 Dermatologic / Skin Assessment (not related to wound area) ASSESSMENTS - Ostomy and/or Continence Assessment and Care []  - 0 Incontinence Assessment and Management []  - 0 Ostomy Care Assessment and Management (repouching, etc.) PROCESS - Coordination of Care X - Simple Patient / Family Education for ongoing care 1 15 []  - 0 Complex (extensive) Patient / Family Education for ongoing care X- 1 10 Staff obtains Programmer, systems, Records, T Results / Process Orders est X- 1 10 Staff telephones HHA, Nursing Homes / Clarify orders / etc []  - 0 Routine Transfer to another Facility (non-emergent condition) []  - 0 Routine Hospital Admission (non-emergent condition) X- 1 15 New Admissions / Biomedical engineer / Ordering NPWT Apligraf, etc. , []  - 0 Emergency Hospital Admission (emergent condition) PROCESS - Special Needs []  - 0 Pediatric / Minor Patient Management []  - 0 Isolation Patient Management []  - 0 Hearing / Language / Visual special needs []  - 0 Assessment of Community assistance (transportation, D/C planning, etc.) []  - 0 Additional assistance / Altered mentation []  - 0 Support Surface(s) Assessment (bed, cushion, seat, etc.) INTERVENTIONS - Miscellaneous []  - 0 External ear exam []  - 0 Patient Transfer (multiple staff / Civil Service fast streamer / Similar devices) []  - 0 Simple Staple / Suture removal (25 or less) []  - 0 Complex Staple /  Suture removal (26 or more) []  - 0 Hypo/Hyperglycemic Management (do not check if billed separately) X- 1 15 Ankle / Brachial Index (ABI) - do not check if billed separately Has the patient been seen at the hospital within the last three years: Yes Total Score: 120 Level  Of Care: New/Established - Level 4 Electronic Signature(s) Signed: 07/22/2020 6:57:58 PM By: Deon Pilling Entered By: Deon Pilling on 07/22/2020 10:31:59 -------------------------------------------------------------------------------- Compression Therapy Details Patient Name: Date of Service: Jodi Keller, Jodi Keller 07/22/2020 9:00 A M Medical Record Number: [id number] Patient Account Number: [id number] Date of Birth/Sex: Treating RN: 11-25-1955 (65 y.o. Female) Deon Pilling Primary Care Arijana Narayan: Cathlean Cower Other Clinician: Referring Bristol Osentoski: Treating Jaystin Mcgarvey/Extender: Jeri Modena in Treatment: 0 Compression Therapy Performed for Wound Assessment: Wound #1 Right,Medial Foot Performed By: Clinician Leane Call, RN Compression Type: Three Layer Pre Treatment ABI: 1.2 Post Procedure Diagnosis Same as Pre-procedure Electronic Signature(s) Signed: 07/22/2020 6:57:58 PM By: Deon Pilling Entered By: Deon Pilling on 07/22/2020 10:29:30 -------------------------------------------------------------------------------- Encounter Discharge Information Details Patient Name: Date of Service: Jodi Friends. 07/22/2020 9:00 A M Medical Record Number: [id number] Patient Account Number: [id number] Date of Birth/Sex: Treating RN: 05/16/1955 (65 y.o. Female) Leane Call Primary Care Loralee Weitzman: Cathlean Cower Other Clinician: Referring Avalon Coppinger: Treating Karolee Meloni/Extender: Jeri Modena in Treatment: 0 Encounter Discharge Information Items Post Procedure Vitals Discharge Condition: Stable Temperature (F): 98.3 Ambulatory Status: Walker Pulse (bpm): 75 Discharge  Destination: Home Respiratory Rate (breaths/min): 20 Transportation: Private Auto Blood Pressure (mmHg): 106/75 Accompanied By: friend Schedule Follow-up Appointment: No Clinical Summary of Care: Electronic Signature(s) Signed: 07/27/2020 6:17:43 PM By: Leane Call Entered By: Leane Call on 07/22/2020 10:59:07 -------------------------------------------------------------------------------- Lower Extremity Assessment Details Patient Name: Date of Service: Jodi Keller, Jodi Keller 07/22/2020 9:00 A M Medical Record Number: [id number] Patient Account Number: [id number] Date of Birth/Sex: Treating RN: 04/30/1955 (65 y.o. Female) Baruch Gouty Primary Care Krisi Azua: Cathlean Cower Other Clinician: Referring Jerric Oyen: Treating Natividad Schlosser/Extender: Jeri Modena in Treatment: 0 Edema Assessment Assessed: Shirlyn Goltz: No] [Right: No] E[Left: dema] [Right: :] Calf Left: Right: Point of Measurement: From Medial Instep 38 cm Ankle Left: Right: Point of Measurement: From Medial Instep 25.3 cm Vascular Assessment Pulses: Dorsalis Pedis Palpable: [Right:Yes] Blood Pressure: Brachial: [Right:126] Dorsalis Pedis: 128 Ankle: Posterior Tibial: 158 Ankle Brachial Index: [Right:1.25] Electronic Signature(s) Signed: 07/22/2020 6:41:14 PM By: Baruch Gouty RN, BSN Entered By: Baruch Gouty on 07/22/2020 10:02:01 -------------------------------------------------------------------------------- Multi Wound Chart Details Patient Name: Date of Service: Jodi Friends. 07/22/2020 9:00 A M Medical Record Number: [id number] Patient Account Number: [id number] Date of Birth/Sex: Treating RN: 1956/01/13 (65 y.o. Female) Deon Pilling Primary Care Mikhai Bienvenue: Cathlean Cower Other Clinician: Referring Tiny Rietz: Treating Sanda Dejoy/Extender: Jeri Modena in Treatment: 0 Vital Signs Height(in): 22 Pulse(bpm): 105 Weight(lbs): Blood Pressure(mmHg): 126/75 Body  Mass Index(BMI): Temperature(F): 98.3 Respiratory Rate(breaths/min): 20 Photos: [1:No Photos Right, Medial Foot] [N/A:N/A N/A] Wound Location: [1:Surgical Injury] [N/A:N/A] Wounding Event: [1:Open Surgical Wound] [N/A:N/A] Primary Etiology: [1:Cataracts, Anemia, Hypertension,] [N/A:N/A] Comorbid History: [1:Type II Diabetes, Osteoarthritis 03/31/2020] [N/A:N/A] Date Acquired: [1:0] [N/A:N/A] Weeks of Treatment: [1:Open] [N/A:N/A] Wound Status: [1:2.4x3x0.6] [N/A:N/A] Measurements L x W x D (cm) [1:5.655] [N/A:N/A] A (cm) : rea [1:3.393] [N/A:N/A] Volume (cm) : [1:Full Thickness With Exposed Support] [N/A:N/A] Classification: [1:Structures Medium] [N/A:N/A] Exudate Amount: [1:Serosanguineous] [N/A:N/A] Exudate Type: [1:red, brown] [N/A:N/A] Exudate Color: [1:Flat and Intact] [N/A:N/A] Wound Margin: [1:Medium (34-66%)] [N/A:N/A] Granulation Amount: [1:Red] [N/A:N/A] Granulation Quality: [1:Medium (34-66%)] [N/A:N/A] Necrotic Amount: [1:Fat Layer (Subcutaneous Tissue): Yes N/A] Exposed Structures: [1:Bone: Yes Fascia: No Tendon: No Muscle: No Joint: No Small (1-33%)] [N/A:N/A] Epithelialization: [1:Debridement - Excisional] [N/A:N/A] Debridement: Pre-procedure Verification/Time Out 10:25 [N/A:N/A] Taken: [1:Lidocaine 4% Topical Solution] [N/A:N/A] Pain Control: [1:Subcutaneous, Slough] [N/A:N/A] Tissue Debrided: [1:Skin/Subcutaneous Tissue] [N/A:N/A] Level: [1:7.2] [N/A:N/A]  Debridement A (sq cm): [1:rea Curette] [N/A:N/A] Instrument: [1:Minimum] [N/A:N/A] Bleeding: [1:Pressure] [N/A:N/A] Hemostasis A chieved: [1:0] [N/A:N/A] Procedural Pain: [1:4] [N/A:N/A] Post Procedural Pain: [1:Procedure was tolerated well] [N/A:N/A] Debridement Treatment Response: [1:2.4x3x0.6] [N/A:N/A] Post Debridement Measurements L x W x D (cm) [1:3.393] [N/A:N/A] Post Debridement Volume: (cm) [1:Compression Therapy] [N/A:N/A] Procedures Performed: [1:Debridement] Treatment Notes Electronic  Signature(s) Signed: 07/22/2020 5:19:12 PM By: Linton Ham MD Signed: 07/22/2020 6:57:58 PM By: Deon Pilling Entered By: Linton Ham on 07/22/2020 10:48:11 -------------------------------------------------------------------------------- Multi-Disciplinary Care Plan Details Patient Name: Date of Service: Jodi Friends. 07/22/2020 9:00 A M Medical Record Number: [id number] Patient Account Number: [id number] Date of Birth/Sex: Treating RN: Sep 03, 1955 (65 y.o. Female) Deon Pilling Primary Care Audric Venn: Cathlean Cower Other Clinician: Referring Daimian Sudberry: Treating Michele Judy/Extender: Jeri Modena in Treatment: 0 Active Inactive Nutrition Nursing Diagnoses: Potential for alteratiion in Nutrition/Potential for imbalanced nutrition Goals: Patient/caregiver agrees to and verbalizes understanding of need to obtain nutritional consultation Date Initiated: 07/22/2020 Target Resolution Date: 07/30/2020 Goal Status: Active Interventions: Provide education on nutrition Treatment Activities: Patient referred to Primary Care Physician for further nutritional evaluation : 07/22/2020 Notes: Orientation to the Wound Care Program Nursing Diagnoses: Knowledge deficit related to the wound healing center program Goals: Patient/caregiver will verbalize understanding of the [place] Program Date Initiated: 07/22/2020 Target Resolution Date: 07/30/2020 Goal Status: Active Interventions: Provide education on orientation to the wound center Notes: Wound/Skin Impairment Nursing Diagnoses: Knowledge deficit related to ulceration/compromised skin integrity Goals: Patient/caregiver will verbalize understanding of skin care regimen Date Initiated: 07/22/2020 Target Resolution Date: 08/19/2020 Goal Status: Active Interventions: Assess patient/caregiver ability to obtain necessary supplies Assess patient/caregiver ability to perform ulcer/skin care regimen upon admission  and as needed Provide education on ulcer and skin care Treatment Activities: Skin care regimen initiated : 07/22/2020 Topical wound management initiated : 07/22/2020 Notes: Electronic Signature(s) Signed: 07/22/2020 6:57:58 PM By: Deon Pilling Entered By: Deon Pilling on 07/22/2020 10:22:33 -------------------------------------------------------------------------------- Pain Assessment Details Patient Name: Date of Service: Jodi Keller, Jodi Keller 07/22/2020 9:00 A M Medical Record Number: [id number] Patient Account Number: [id number] Date of Birth/Sex: Treating RN: [birth year] (66 y.o. Female) Baruch Gouty Primary Care Leya Paige: Cathlean Cower Other Clinician: Referring Elynore Dolinski: Treating Val Farnam/Extender: Jeri Modena in Treatment: 0 Active Problems Location of Pain Severity and Description of Pain Patient Has Paino Yes Site Locations Pain Location: Pain in Ulcers With Dressing Change: Yes Duration of the Pain. Constant / Intermittento Intermittent Rate the pain. Current Pain Level: 0 Worst Pain Level: 4 Least Pain Level: 0 Character of Pain Describe the Pain: Aching Pain Management and Medication Current Pain Management: Other: tolerable Is the Current Pain Management Adequate: Adequate How does your wound impact your activities of daily livingo Sleep: No Bathing: No Appetite: No Relationship With Others: No Bladder Continence: No Emotions: No Bowel Continence: No Work: No Toileting: No Drive: No Dressing: No Hobbies: No Electronic Signature(s) Signed: 07/22/2020 6:41:14 PM By: Baruch Gouty RN, BSN Entered By: Baruch Gouty on 07/22/2020 10:04:24 -------------------------------------------------------------------------------- Patient/Caregiver Education Details Patient Name: Date of Service: Jodi Friends 6/30/2022andnbsp9:00 A M Medical Record Number: [id number] Patient Account Number: [id number] Date of Birth/Gender: Treating  RN: 07-24-1955 (65 y.o. Female) Deon Pilling Primary Care Physician: Cathlean Cower Other Clinician: Referring Physician: Treating Physician/Extender: Jeri Modena in Treatment: 0 Education Assessment Education Provided To: Patient Education Topics Provided Welcome T [facility]: o Handouts: Welcome T [facility] [place] o Methods: Explain/Verbal, Printed Responses: Reinforcements needed  Electronic Signature(s) Signed: 07/22/2020 6:57:58 PM By: Deon Pilling Entered By: Deon Pilling on 07/22/2020 10:23:25 -------------------------------------------------------------------------------- Wound Assessment Details Patient Name: Date of Service: Jodi Keller, Jodi Keller 07/22/2020 9:00 A M Medical Record Number: [id number] Patient Account Number: [id number] Date of Birth/Sex: Treating RN: [birth year] (65 y.o. Female) Baruch Gouty Primary Care Dorathy Stallone: Cathlean Cower Other Clinician: Referring Trinetta Alemu: Treating Malinda Mayden/Extender: Jeri Modena in Treatment: 0 Wound Status Wound Number: 1 Primary Open Surgical Wound Etiology: Wound Location: Right, Medial Foot Wound Status: Open Wounding Event: Surgical Injury Comorbid Cataracts, Anemia, Hypertension, Type II Diabetes, Date Acquired: 03/31/2020 History: Osteoarthritis Weeks Of Treatment: 0 Clustered Wound: No Photos Wound Measurements Length: (cm) 2.4 Width: (cm) 3 Depth: (cm) 0.6 Area: (cm) 5.655 Volume: (cm) 3.393 % Reduction in Area: 0% % Reduction in Volume: 0% Epithelialization: Small (1-33%) Tunneling: No Undermining: No Wound Description Classification: Full Thickness With Exposed Support Structures Wound Margin: Flat and Intact Exudate Amount: Medium Exudate Type: Serosanguineous Exudate Color: red, brown Foul Odor After Cleansing: No Slough/Fibrino Yes Wound Bed Granulation Amount: Medium (34-66%) Exposed Structure Granulation Quality: Red Fascia Exposed:  No Necrotic Amount: Medium (34-66%) Fat Layer (Subcutaneous Tissue) Exposed: Yes Necrotic Quality: Adherent Slough Tendon Exposed: No Muscle Exposed: No Joint Exposed: No Bone Exposed: Yes Treatment Notes Wound #1 (Foot) Wound Laterality: Right, Medial Cleanser Soap and Water Discharge Instruction: May shower and wash wound with dial antibacterial soap and water prior to dressing change. Wound Cleanser Discharge Instruction: Cleanse the wound with wound cleanser prior to applying a clean dressing using gauze sponges, not tissue or cotton balls. Peri-Wound Care Sween Lotion (Moisturizing lotion) Discharge Instruction: Apply moisturizing lotion as directed Topical Primary Dressing IODOFLEX 0.9% Cadexomer Iodine Pad 4x6 cm Discharge Instruction: ensure to get down into wound bed and depth. Secondary Dressing Woven Gauze Sponge, Non-Sterile 4x4 in Discharge Instruction: Apply over primary dressing as directed. ABD Pad, 8x10 Discharge Instruction: Apply over primary dressing as directed. Secured With Compression Wrap ThreePress (3 layer compression wrap) Discharge Instruction: Apply three layer compression as directed. Compression Stockings Add-Ons Electronic Signature(s) Signed: 07/23/2020 10:25:49 AM By: Sandre Kitty Signed: 07/23/2020 1:24:03 PM By: Baruch Gouty RN, BSN Previous Signature: 07/22/2020 6:41:14 PM Version By: Baruch Gouty RN, BSN Entered By: Sandre Kitty on 07/23/2020 09:47:24 -------------------------------------------------------------------------------- [place] Details Patient Name: Date of Service: Jodi Keller, Jodi Keller 07/22/2020 9:00 A M Medical Record Number: [id number] Patient Account Number: [id number] Date of Birth/Sex: Treating RN: 27-Mar-1955 (65 y.o. Female) Baruch Gouty Primary Care Abbagail Scaff: Cathlean Cower Other Clinician: Referring Dillan Candela: Treating Chenita Ruda/Extender: Jeri Modena in Treatment: 0 Vital Signs Time  Taken: 09:29 Temperature (F): 98.3 Height (in): 67 Pulse (bpm): 105 Source: Stated Respiratory Rate (breaths/min): 20 Blood Pressure (mmHg): 126/75 Reference Range: 80 - 120 mg / dl Electronic Signature(s) Signed: 07/22/2020 6:41:14 PM By: Baruch Gouty RN, BSN Entered By: Baruch Gouty on 07/22/2020 09:29:49

## 2020-07-29 ENCOUNTER — Other Ambulatory Visit: Payer: Self-pay

## 2020-07-29 ENCOUNTER — Encounter (HOSPITAL_BASED_OUTPATIENT_CLINIC_OR_DEPARTMENT_OTHER): Payer: Managed Care, Other (non HMO) | Attending: Internal Medicine | Admitting: Internal Medicine

## 2020-07-29 DIAGNOSIS — E11622 Type 2 diabetes mellitus with other skin ulcer: Secondary | ICD-10-CM | POA: Insufficient documentation

## 2020-07-29 DIAGNOSIS — L97518 Non-pressure chronic ulcer of other part of right foot with other specified severity: Secondary | ICD-10-CM | POA: Diagnosis not present

## 2020-07-29 DIAGNOSIS — E11621 Type 2 diabetes mellitus with foot ulcer: Secondary | ICD-10-CM | POA: Insufficient documentation

## 2020-07-30 NOTE — Progress Notes (Signed)
Jodi Keller, Jodi Keller ([id number]) Visit Report for 07/29/2020 Arrival Information Details Patient Name: Date of Service: Jodi Keller, Jodi Keller 07/29/2020 3:15 PM Medical Record Number: [id number] Patient Account Number: [id number] Date of Birth/Sex: Treating RN: 03-May-1955 (65 y.o. Tonita Phoenix, Lauren Primary Care Shakeisha Horine: Cathlean Cower Other Clinician: Referring Ramesses Crampton: Treating Lenyx Boody/Extender: Jeri Modena in Treatment: 1 Visit Information History Since Last Visit Added or deleted any medications: No Patient Arrived: [name] Any new allergies or adverse reactions: No Arrival Time: 15:27 Had a fall or experienced change in No Accompanied By: family activities of daily living that may affect Transfer Assistance: None risk of falls: Patient Identification Verified: Yes Signs or symptoms of abuse/neglect since last visito No Secondary Verification Process Completed: Yes Hospitalized since last visit: No Patient Requires Transmission-Based Precautions: No Implantable device outside of the clinic excluding No Patient Has Alerts: No cellular tissue based products placed in the center since last visit: Has Dressing in Place as Prescribed: Yes Pain Present Now: No Electronic Signature(s) Signed: 07/30/2020 5:38:08 PM By: Rhae Hammock RN Entered By: Rhae Hammock on 07/29/2020 15:27:29 -------------------------------------------------------------------------------- Compression Therapy Details Patient Name: Date of Service: Jodi Keller 07/29/2020 3:15 PM Medical Record Number: [id number] Patient Account Number: [id number] Date of Birth/Sex: Treating RN: [birth year] (65 y.o. [name] Primary Care Raistlin Gum: Cathlean Cower Other Clinician: Referring Jidenna Figgs: Treating Cecilia Nishikawa/Extender: Jeri Modena in Treatment: 1 Compression Therapy Performed for Wound Assessment: Wound #1 Right,Medial Foot Performed By: Clinician Rhae Hammock,  RN Compression Type: Three Layer Post Procedure Diagnosis Same as Pre-procedure Electronic Signature(s) Signed: 07/29/2020 6:24:11 PM By: Deon Pilling Entered By: Deon Pilling on 07/29/2020 16:00:22 -------------------------------------------------------------------------------- Encounter Discharge Information Details Patient Name: Date of Service: Jodi Keller, Jodi Keller 07/29/2020 3:15 PM Medical Record Number: [id number] Patient Account Number: [id number] Date of Birth/Sex: Treating RN: [birth year] (65 y.o. Tonita Phoenix, Lauren Primary Care Kanchan Gal: Cathlean Cower Other Clinician: Referring Allesandra Huebsch: Treating Tina Temme/Extender: Jeri Modena in Treatment: 1 Encounter Discharge Information Items Discharge Condition: Stable Ambulatory Status: Ambulatory Discharge Destination: Home Transportation: Private Auto Accompanied By: self Schedule Follow-up Appointment: Yes Clinical Summary of Care: Patient Declined Electronic Signature(s) Signed: 07/30/2020 5:38:08 PM By: Rhae Hammock RN Entered By: Rhae Hammock on 07/29/2020 17:53:35 -------------------------------------------------------------------------------- Lower Extremity Assessment Details Patient Name: Date of Service: Jodi Keller, Jodi Keller 07/29/2020 3:15 PM Medical Record Number: [id number] Patient Account Number: [id number] Date of Birth/Sex: Treating RN: [birth year] (65 y.o. Tonita Phoenix, Lauren Primary Care Jhonny Calixto: Cathlean Cower Other Clinician: Referring Annleigh Knueppel: Treating Hassani Sliney/Extender: Jeri Modena in Treatment: 1 Edema Assessment Assessed: Shirlyn Goltz: No] Patrice Paradise: Yes] Edema: [Left: Ye] [Right: s] Calf Left: Right: Point of Measurement: From Medial Instep 38 cm Ankle Left: Right: Point of Measurement: From Medial Instep 23 cm Vascular Assessment Pulses: Dorsalis Pedis Palpable: [Right:Yes] Posterior Tibial Palpable: [Right:Yes] Electronic Signature(s) Signed:  07/30/2020 5:38:08 PM By: Rhae Hammock RN Entered By: Rhae Hammock on 07/29/2020 15:37:04 -------------------------------------------------------------------------------- Multi Wound Chart Details Patient Name: Date of Service: Jodi Keller. 07/29/2020 3:15 PM Medical Record Number: [id number] Patient Account Number: [id number] Date of Birth/Sex: Treating RN: [birth year] (65 y.o. [name] Primary Care Latrish Mogel: Cathlean Cower Other Clinician: Referring Patrici Minnis: Treating Lynia Landry/Extender: Jeri Modena in Treatment: 1 Vital Signs Height(in): 67 Pulse(bpm): 30 Weight(lbs): Blood Pressure(mmHg): 133/83 Body Mass Index(BMI): Temperature(F): 97.4 Respiratory Rate(breaths/min): 17 Photos: [1:No Photos Right, Medial Foot] [N/A:N/A N/A] Wound Location: [1:Surgical Injury] [N/A:N/A] Wounding Event: [1:Open Surgical Wound] [N/A:N/A] Primary Etiology: [1:Cataracts,  Anemia, Hypertension,] [N/A:N/A] Comorbid History: [1:Type II Diabetes, Osteoarthritis 03/31/2020] [N/A:N/A] Date Acquired: [1:1] [N/A:N/A] Weeks of Treatment: [1:Open] [N/A:N/A] Wound Status: [1:3x3.1x0.6] [N/A:N/A] Measurements L x W x D (cm) [1:7.304] [N/A:N/A] A (cm) : rea [1:4.383] [N/A:N/A] Volume (cm) : [1:-29.20%] [N/A:N/A] % Reduction in Area: [1:-29.20%] [N/A:N/A] % Reduction in Volume: [1:Full Thickness With Exposed Support N/A] Classification: [1:Structures Medium] [N/A:N/A] Exudate Amount: [1:Serosanguineous] [N/A:N/A] Exudate Type: [1:red, brown] [N/A:N/A] Exudate Color: [1:Flat and Intact] [N/A:N/A] Wound Margin: [1:Large (67-100%)] [N/A:N/A] Granulation Amount: [1:Red] [N/A:N/A] Granulation Quality: [1:Small (1-33%)] [N/A:N/A] Necrotic Amount: [1:Fat Layer (Subcutaneous Tissue): Yes N/A] Exposed Structures: [1:Bone: Yes Fascia: No Tendon: No Muscle: No Joint: No Small (1-33%)] [N/A:N/A] Epithelialization: [1:Compression Therapy] [N/A:N/A] Treatment  Notes Electronic Signature(s) Signed: 07/29/2020 6:24:11 PM By: Deon Pilling Signed: 07/29/2020 8:35:52 PM By: Linton Ham MD Entered By: Linton Ham on 07/29/2020 17:00:21 -------------------------------------------------------------------------------- Multi-Disciplinary Care Plan Details Patient Name: Date of Service: Jodi Keller, Jodi Keller 07/29/2020 3:15 PM Medical Record Number: [id number] Patient Account Number: [id number] Date of Birth/Sex: Treating RN: [birth year] (65 y.o. [name], [name] Primary Care Maeby Vankleeck: Cathlean Cower Other Clinician: Referring Adir Schicker: Treating Ranita Stjulien/Extender: Jeri Modena in Treatment: 1 Active Inactive Nutrition Nursing Diagnoses: Potential for alteratiion in Nutrition/Potential for imbalanced nutrition Goals: Patient/caregiver agrees to and verbalizes understanding of need to obtain nutritional consultation Date Initiated: 07/22/2020 Target Resolution Date: 07/30/2020 Goal Status: Active Interventions: Provide education on nutrition Treatment Activities: Patient referred to Primary Care Physician for further nutritional evaluation : 07/22/2020 Notes: Wound/Skin Impairment Nursing Diagnoses: Knowledge deficit related to ulceration/compromised skin integrity Goals: Patient/caregiver will verbalize understanding of skin care regimen Date Initiated: 07/22/2020 Target Resolution Date: 08/19/2020 Goal Status: Active Interventions: Assess patient/caregiver ability to obtain necessary supplies Assess patient/caregiver ability to perform ulcer/skin care regimen upon admission and as needed Provide education on ulcer and skin care Treatment Activities: Skin care regimen initiated : 07/22/2020 Topical wound management initiated : 07/22/2020 Notes: Electronic Signature(s) Signed: 07/29/2020 6:24:11 PM By: Deon Pilling Entered By: Deon Pilling on 07/29/2020  15:33:00 -------------------------------------------------------------------------------- Pain Assessment Details Patient Name: Date of Service: Jodi Keller, Jodi Keller 07/29/2020 3:15 PM Medical Record Number: [id number] Patient Account Number: [id number] Date of Birth/Sex: Treating RN: 1956/01/14 (65 y.o. Tonita Phoenix, Lauren Primary Care Naveena Eyman: Cathlean Cower Other Clinician: Referring Raelynn Corron: Treating Prescott Truex/Extender: Jeri Modena in Treatment: 1 Active Problems Location of Pain Severity and Description of Pain Patient Has Paino No Site Locations Pain Management and Medication Current Pain Management: Electronic Signature(s) Signed: 07/30/2020 5:38:08 PM By: Rhae Hammock RN Entered By: Rhae Hammock on 07/29/2020 15:30:39 -------------------------------------------------------------------------------- Patient/Caregiver Education Details Patient Name: Date of Service: Jodi Keller 7/7/2022andnbsp3:15 PM Medical Record Number: [id number] Patient Account Number: [id number] Date of Birth/Gender: Treating RN: April 15, 1955 (65 y.o. [name] Primary Care Physician: Cathlean Cower Other Clinician: Referring Physician: Treating Physician/Extender: Jeri Modena in Treatment: 1 Education Assessment Education Provided To: Patient Education Topics Provided Nutrition: Handouts: Nutrition Methods: Explain/Verbal Responses: Reinforcements needed Electronic Signature(s) Signed: 07/29/2020 6:24:11 PM By: Deon Pilling Entered By: Deon Pilling on 07/29/2020 15:33:15 -------------------------------------------------------------------------------- Wound Assessment Details Patient Name: Date of Service: Jodi Keller, Jodi Keller 07/29/2020 3:15 PM Medical Record Number: [id number] Patient Account Number: [id number] Date of Birth/Sex: Treating RN: 05-Jan-1956 (65 y.o. Tonita Phoenix, Lauren Primary Care Bora Bost: Cathlean Cower Other  Clinician: Referring Takina Busser: Treating Jerianne Anselmo/Extender: Jeri Modena in Treatment: 1 Wound Status Wound Number: 1 Primary Open Surgical Wound Etiology: Wound Location: Right, Medial Foot Wound Status:  Open Wounding Event: Surgical Injury Comorbid Cataracts, Anemia, Hypertension, Type II Diabetes, Date Acquired: 03/31/2020 History: Osteoarthritis Weeks Of Treatment: 1 Clustered Wound: No Photos Wound Measurements Length: (cm) 3 Width: (cm) 3.1 Depth: (cm) 0.6 Area: (cm) 7.304 Volume: (cm) 4.383 % Reduction in Area: -29.2% % Reduction in Volume: -29.2% Epithelialization: Small (1-33%) Tunneling: No Undermining: No Wound Description Classification: Full Thickness With Exposed Support Structures Wound Margin: Flat and Intact Exudate Amount: Medium Exudate Type: Serosanguineous Exudate Color: red, brown Foul Odor After Cleansing: No Slough/Fibrino Yes Wound Bed Granulation Amount: Large (67-100%) Exposed Structure Granulation Quality: Red Fascia Exposed: No Necrotic Amount: Small (1-33%) Fat Layer (Subcutaneous Tissue) Exposed: Yes Necrotic Quality: Adherent Slough Tendon Exposed: No Muscle Exposed: No Joint Exposed: No Bone Exposed: Yes Treatment Notes Wound #1 (Foot) Wound Laterality: Right, Medial Cleanser Soap and Water Discharge Instruction: May shower and wash wound with dial antibacterial soap and water prior to dressing change. Wound Cleanser Discharge Instruction: Cleanse the wound with wound cleanser prior to applying a clean dressing using gauze sponges, not tissue or cotton balls. Peri-Wound Care Triamcinolone 15 (g) Discharge Instruction: ****Apply in office only***Use triamcinolone 15 (g) as directed Sween Lotion (Moisturizing lotion) Discharge Instruction: Apply moisturizing lotion as directed Topical Primary Dressing IODOFLEX 0.9% Cadexomer Iodine Pad 4x6 cm Discharge Instruction: ensure to get down into wound bed and  depth. Secondary Dressing Woven Gauze Sponge, Non-Sterile 4x4 in Discharge Instruction: Apply over primary dressing as directed. ABD Pad, 8x10 Discharge Instruction: Apply over primary dressing as directed. Secured With Compression Wrap ThreePress (3 layer compression wrap) Discharge Instruction: Apply three layer compression as directed. Compression Stockings Add-Ons Electronic Signature(s) Signed: 07/30/2020 5:16:50 PM By: Sandre Kitty Signed: 07/30/2020 5:38:08 PM By: Rhae Hammock RN Entered By: Sandre Kitty on 07/30/2020 15:49:51 -------------------------------------------------------------------------------- Vitals Details Patient Name: Date of Service: Jodi Keller, Jodi Keller 07/29/2020 3:15 PM Medical Record Number: [id number] Patient Account Number: [id number] Date of Birth/Sex: Treating RN: Apr 22, 1955 (65 y.o. Tonita Phoenix, Lauren Primary Care Deloise Marchant: Cathlean Cower Other Clinician: Referring Lin Hackmann: Treating Lorilee Cafarella/Extender: Jeri Modena in Treatment: 1 Vital Signs Time Taken: 15:27 Temperature (F): 97.4 Height (in): 67 Pulse (bpm): 74 Respiratory Rate (breaths/min): 17 Blood Pressure (mmHg): 133/83 Reference Range: 80 - 120 mg / dl Electronic Signature(s) Signed: 07/30/2020 5:38:08 PM By: Rhae Hammock RN Entered By: Rhae Hammock on 07/29/2020 15:30:34

## 2020-08-03 NOTE — Progress Notes (Signed)
EILISH, MCDANIEL ([id number]) Visit Report for 07/29/2020 HPI Details Patient Name: Date of Service: Jodi Keller, Jodi Keller 07/29/2020 3:15 PM Medical Record Number: [id number] Patient Account Number: [id number] Date of Birth/Sex: Treating RN: [birth year] (65 y.o. Jodi Keller Primary Care Provider: Cathlean Cower Other Clinician: Referring Provider: Treating Provider/Extender: Jeri Modena in Treatment: 1 History of Present Illness HPI Description: ADMISSION 07/22/2020 This is a 65 year old woman who is here accompanied by her sister-in-law. Her problem was avascular necrosis of the right talus and for a long period of time was walking with a great deal of pain on the outside of her foot. After consulting several local doctors and podiatrist who would not consider her for surgery she was referred to orthopedics at [facility]. On 03/31/2020 she underwent an arthrodesis of the right ankle and an Achilles tenotomy. She apparently had stem cell harvest and infusion as part of this. She had surgeries on the lateral and medial part of her foot the lateral wound is healed she has been left with an open area which I think was a surgical wound dehiscence on the medial ankle. She was referred here by orthopedics at [facility] for ongoing wound care. She was put in a cam boot which she is still using in April. She also had some form of medicated wrap that she developed an acute allergic response to. The only thing we could see in looking through care everywhere was no allergy to zinc. She currently is using some form of silver impregnated Mepilex border. She has home health changing this. She had an x-ray done at [facility]. 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 Past medical history includes type 2 diabetes although the patient denies this she was told at [facility] that she was a diabetic. She is not currently on any treatment.  She also has hidradenitis listed but she denies this as well stating she does not even know what this is. She has gastroesophageal reflux disease hypertension. ABI in our clinic was 1.25 on the right 07/29/2020; patient came in today complaining about the amount of pain that was caused by the debridement last time. Fortunately the surface of her wound looks a lot better and no debridement was required. We have been using Iodoflex as a primary dressing to help with antibacterial effect and ongoing debridement. She still has the same probing tunnel to either bone, hardware or calcification. The hole is very tiny. Most of the rest of the surface of her wound looks fairly good Electronic Signature(s) Signed: 07/29/2020 8:35:52 PM By: Linton Ham MD Entered By: Linton Ham on 07/29/2020 17:01:31 -------------------------------------------------------------------------------- Physical Exam Details Patient Name: Date of Service: Jodi Keller, Jodi Keller 07/29/2020 3:15 PM Medical Record Number: [id number] Patient Account Number: [id number] Date of Birth/Sex: Treating RN: 12-20-1955 (65 y.o. Jodi Keller Primary Care Provider: Cathlean Cower Other Clinician: Referring Provider: Treating Provider/Extender: Jeri Modena in Treatment: 1 Constitutional Sitting or standing Blood Pressure is within target range for patient.. Pulse regular and within target range for patient.[name] Respirations regular, non-labored and within target range.. Temperature is normal and within the target range for the patient.[name] Appears in no distress. Notes Wound exam; the wound is on the medial part of her right foot. Much better surface than last week at which time this was 100% covered in a very fibrinous adherent surface. Most of the most of this today looks like reasonably healthy granulation there is still  a gritty texture to this but I did not feel pressed to debride this. In the center of this there is  still a small tunnel that goes to bone or hardware but there is no purulent drainage no infection around the wound Electronic Signature(s) Signed: 07/29/2020 8:35:52 PM By: Linton Ham MD Entered By: Linton Ham on 07/29/2020 17:02:34 -------------------------------------------------------------------------------- Physician Orders Details Patient Name: Date of Service: Jodi Keller, Jodi Keller 07/29/2020 3:15 PM Medical Record Number: [id number] Patient Account Number: [id number] Date of Birth/Sex: Treating RN: 07-13-1955 (65 y.o. Jodi Keller Primary Care Provider: Cathlean Cower Other Clinician: Referring Provider: Treating Provider/Extender: Jeri Modena in Treatment: 1 Verbal / Phone Orders: No Diagnosis Coding ICD-10 Coding Code Description T81.31XS Disruption of external operation (surgical) wound, not elsewhere classified, sequela L97.518 Non-pressure chronic ulcer of other part of right foot with other specified severity E11.622 Type 2 diabetes mellitus with other skin ulcer Follow-up Appointments ppointment in 1 week. - Dr. Dellia Nims Return A Cellular or Tissue Based Products Cellular or Tissue Based Product Type: - wound center to see if insurance will approve Epifix. Bathing/ Shower/ Hygiene May shower and wash wound with soap and water. - with dressing changes. Edema Control - Lymphedema / SCD / Other Elevate legs to the level of the heart or above for 30 minutes daily and/or when sitting, a frequency of: - throughout the day. Avoid standing for long periods of time. Home Health Wound #1 Right,Medial Foot New wound care orders this week; continue Home Health for wound care. May utilize formulary equivalent dressing for wound treatment orders unless otherwise specified. - Interim Home Health to change once a week. iodoflex primary dressing with abd pad secondary with a 3 layer compression wrap. Wound Treatment Wound #1 - Foot Wound Laterality: Right,  Medial Cleanser: Soap and Water (Home Health) 3 x Per Week/30 Days Discharge Instructions: May shower and wash wound with dial antibacterial soap and water prior to dressing change. Cleanser: Wound Cleanser (Home Health) 3 x Per Week/30 Days Discharge Instructions: Cleanse the wound with wound cleanser prior to applying a clean dressing using gauze sponges, not tissue or cotton balls. Peri-Wound Care: Triamcinolone 15 (g) 3 x Per Week/30 Days Discharge Instructions: ****Apply in office only***Use triamcinolone 15 (g) as directed Peri-Wound Care: Sween Lotion (Moisturizing lotion) (Home Health) 3 x Per Week/30 Days Discharge Instructions: Apply moisturizing lotion as directed Prim Dressing: IODOFLEX 0.9% Cadexomer Iodine Pad 4x6 cm (Home Health) 3 x Per Week/30 Days ary Discharge Instructions: ensure to get down into wound bed and depth. Secondary Dressing: Woven Gauze Sponge, Non-Sterile 4x4 in (Home Health) 3 x Per Week/30 Days Discharge Instructions: Apply over primary dressing as directed. Secondary Dressing: ABD Pad, 8x10 (Home Health) 3 x Per Week/30 Days Discharge Instructions: Apply over primary dressing as directed. Compression Wrap: ThreePress (3 layer compression wrap) (Home Health) 3 x Per Week/30 Days Discharge Instructions: Apply three layer compression as directed. Electronic Signature(s) Signed: 07/29/2020 6:24:11 PM By: Deon Pilling Signed: 07/29/2020 8:35:52 PM By: Linton Ham MD Entered By: Deon Pilling on 07/29/2020 16:04:11 -------------------------------------------------------------------------------- Problem List Details Patient Name: Date of Service: Jodi Keller, Jodi Keller 07/29/2020 3:15 PM Medical Record Number: [id number] Patient Account Number: [id number] Date of Birth/Sex: Treating RN: 1955-05-14 (65 y.o. Jodi Keller Primary Care Provider: Cathlean Cower Other Clinician: Referring Provider: Treating Provider/Extender: Jeri Modena in  Treatment: 1 Active Problems ICD-10 Encounter Code Description Active Date MDM Diagnosis T81.31XS Disruption of external operation (surgical) wound, not  elsewhere classified, 07/22/2020 No Yes sequela L97.518 Non-pressure chronic ulcer of other part of right foot with other specified 07/22/2020 No Yes severity E11.622 Type 2 diabetes mellitus with other skin ulcer 07/22/2020 No Yes Inactive Problems Resolved Problems Electronic Signature(s) Signed: 07/29/2020 8:35:52 PM By: Linton Ham MD Entered By: Linton Ham on 07/29/2020 16:59:55 -------------------------------------------------------------------------------- Progress Note Details Patient Name: Date of Service: Jodi Friends. 07/29/2020 3:15 PM Medical Record Number: [id number] Patient Account Number: [id number] Date of Birth/Sex: Treating RN: [birth year] (65 y.o. Jodi Keller Primary Care Provider: Cathlean Cower Other Clinician: Referring Provider: Treating Provider/Extender: Jeri Modena in Treatment: 1 Subjective History of Present Illness (HPI) ADMISSION 07/22/2020 This is a 65 year old woman who is here accompanied by her sister-in-law. Her problem was avascular necrosis of the right talus and for a long period of time was walking with a great deal of pain on the outside of her foot. After consulting several local doctors and podiatrist who would not consider her for surgery she was referred to orthopedics at [facility]. On 03/31/2020 she underwent an arthrodesis of the right ankle and an Achilles tenotomy. She apparently had stem cell harvest and infusion as part of this. She had surgeries on the lateral and medial part of her foot the lateral wound is healed she has been left with an open area which I think was a surgical wound dehiscence on the medial ankle. She was referred here by orthopedics at [facility] for ongoing wound care. She was put in a cam boot which she is still using in April. She also had  some form of medicated wrap that she developed an acute allergic response to. The only thing we could see in looking through care everywhere was no allergy to zinc. She currently is using some form of silver impregnated Mepilex border. She has home health changing this. She had an x-ray done at [facility]. 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 Past medical history includes type 2 diabetes although the patient denies this she was told at [facility] that she was a diabetic. She is not currently on any treatment. She also has hidradenitis listed but she denies this as well stating she does not even know what this is. She has gastroesophageal reflux disease hypertension. ABI in our clinic was 1.25 on the right 07/29/2020; patient came in today complaining about the amount of pain that was caused by the debridement last time. Fortunately the surface of her wound looks a lot better and no debridement was required. We have been using Iodoflex as a primary dressing to help with antibacterial effect and ongoing debridement. She still has the same probing tunnel to either bone, hardware or calcification. The hole is very tiny. Most of the rest of the surface of her wound looks fairly good Objective Constitutional Sitting or standing Blood Pressure is within target range for patient.. Pulse regular and within target range for patient.[name] Respirations regular, non-labored and within target range.. Temperature is normal and within the target range for the patient.[name] Appears in no distress. Vitals Time Taken: 3:27 PM, Height: 67 in, Temperature: 97.4 F, Pulse: 74 bpm, Respiratory Rate: 17 breaths/min, Blood Pressure: 133/83 mmHg. General Notes: Wound exam; the wound is on the medial part of her right foot. Much better surface than last week at which time this was 100% covered in a very fibrinous adherent surface. Most of the most of this  today looks like reasonably healthy  granulation there is still a gritty texture to this but I did not feel pressed to debride this. In the center of this there is still a small tunnel that goes to bone or hardware but there is no purulent drainage no infection around the wound Integumentary (Hair, Skin) Wound #1 status is Open. Original cause of wound was Surgical Injury. The date acquired was: 03/31/2020. The wound has been in treatment 1 weeks. The wound is located on the Right,Medial Foot. The wound measures 3cm length x 3.1cm width x 0.6cm depth; 7.304cm^2 area and 4.383cm^3 volume. There is bone and Fat Layer (Subcutaneous Tissue) exposed. There is no tunneling or undermining noted. There is a medium amount of serosanguineous drainage noted. The wound margin is flat and intact. There is large (67-100%) red granulation within the wound bed. There is a small (1-33%) amount of necrotic tissue within the wound bed including Adherent Slough. Assessment Active Problems ICD-10 Disruption of external operation (surgical) wound, not elsewhere classified, sequela Non-pressure chronic ulcer of other part of right foot with other specified severity Type 2 diabetes mellitus with other skin ulcer Procedures Wound #1 Pre-procedure diagnosis of Wound #1 is an Open Surgical Wound located on the Right,Medial Foot . There was a Three Layer Compression Therapy Procedure by Rhae Hammock, RN. Post procedure Diagnosis Wound #1: Same as Pre-Procedure Plan Follow-up Appointments: Return Appointment in 1 week. - Dr. Dellia Nims Cellular or Tissue Based Products: Cellular or Tissue Based Product Type: - wound center to see if insurance will approve Epifix. Bathing/ Shower/ Hygiene: May shower and wash wound with soap and water. - with dressing changes. Edema Control - Lymphedema / SCD / Other: Elevate legs to the level of the heart or above for 30 minutes daily and/or when sitting, a frequency of: - throughout  the day. Avoid standing for long periods of time. Home Health: Wound #1 Right,Medial Foot: New wound care orders this week; continue Home Health for wound care. May utilize formulary equivalent dressing for wound treatment orders unless otherwise specified. - Interim Home Health to change once a week. iodoflex primary dressing with abd pad secondary with a 3 layer compression wrap. WOUND #1: - Foot Wound Laterality: Right, Medial Cleanser: Soap and Water (Home Health) 3 x Per Week/30 Days Discharge Instructions: May shower and wash wound with dial antibacterial soap and water prior to dressing change. Cleanser: Wound Cleanser (Home Health) 3 x Per Week/30 Days Discharge Instructions: Cleanse the wound with wound cleanser prior to applying a clean dressing using gauze sponges, not tissue or cotton balls. Peri-Wound Care: Triamcinolone 15 (g) 3 x Per Week/30 Days Discharge Instructions: ****Apply in office only***Use triamcinolone 15 (g) as directed Peri-Wound Care: Sween Lotion (Moisturizing lotion) (Home Health) 3 x Per Week/30 Days Discharge Instructions: Apply moisturizing lotion as directed Prim Dressing: IODOFLEX 0.9% Cadexomer Iodine Pad 4x6 cm (Home Health) 3 x Per Week/30 Days ary Discharge Instructions: ensure to get down into wound bed and depth. Secondary Dressing: Woven Gauze Sponge, Non-Sterile 4x4 in (Home Health) 3 x Per Week/30 Days Discharge Instructions: Apply over primary dressing as directed. Secondary Dressing: ABD Pad, 8x10 (Home Health) 3 x Per Week/30 Days Discharge Instructions: Apply over primary dressing as directed. Com pression Wrap: ThreePress (3 layer compression wrap) (Home Health) 3 x Per Week/30 Days Discharge Instructions: Apply three layer compression as directed. 1. I continued with the Iodoflex under 3 layer compression 2. Possibly changed to collagen next week 3. Epi fix through her Musician) Signed: 07/29/2020  8:35:52 PM By:  Linton Ham MD Entered By: Linton Ham on 07/29/2020 17:05:24 -------------------------------------------------------------------------------- SuperBill Details Patient Name: Date of Service: Jodi Keller, Jodi Keller 07/29/2020 Medical Record Number: [id number] Patient Account Number: [id number] Date of Birth/Sex: Treating RN: 08-07-1955 (65 y.o. [name], [name] Primary Care Provider: Cathlean Cower Other Clinician: Referring Provider: Treating Provider/Extender: Jeri Modena in Treatment: 1 Diagnosis Coding ICD-10 Codes Code Description T81.31XS Disruption of external operation (surgical) wound, not elsewhere classified, sequela L97.518 Non-pressure chronic ulcer of other part of right foot with other specified severity E11.622 Type 2 diabetes mellitus with other skin ulcer Facility Procedures CPT4 Code: [id number] Description: (Facility Use Only) [place] LWR RT LEG Modifier: Quantity: 1 Physician Procedures : CPT4 Code Description Modifier [id number] - WC PHYS LEVEL 3 - EST PT ICD-10 Diagnosis Description T81.31XS Disruption of external operation (surgical) wound, not elsewhere classified, sequela L97.518 Non-pressure chronic ulcer of other part of right  foot with other specified severity E11.622 Type 2 diabetes mellitus with other skin ulcer Quantity: 1 Electronic Signature(s) Signed: 07/29/2020 8:35:52 PM By: Linton Ham MD Signed: 08/03/2020 5:43:47 PM By: Levan Hurst RN, BSN Entered By: Levan Hurst on 07/29/2020 18:22:36

## 2020-08-05 ENCOUNTER — Other Ambulatory Visit: Payer: Self-pay

## 2020-08-05 ENCOUNTER — Encounter (HOSPITAL_BASED_OUTPATIENT_CLINIC_OR_DEPARTMENT_OTHER): Payer: Managed Care, Other (non HMO) | Admitting: Internal Medicine

## 2020-08-05 DIAGNOSIS — E11621 Type 2 diabetes mellitus with foot ulcer: Secondary | ICD-10-CM | POA: Diagnosis not present

## 2020-08-05 NOTE — Progress Notes (Signed)
IYARI, HAGNER ([id number]) Visit Report for 08/05/2020 HPI Details Patient Name: Date of Service: RONEISHA, STERN 08/05/2020 3:30 PM Medical Record Number: [id number] Patient Account Number: [id number] Date of Birth/Sex: Treating RN: Jan 06, 1956 (65 y.o. [name] Primary Care Provider: Cathlean Cower Other Clinician: Referring Provider: Treating Provider/Extender: Jeri Modena in Treatment: 2 History of Present Illness HPI Description: ADMISSION 07/22/2020 This is a 65 year old woman who is here accompanied by her sister-in-law. Her problem was avascular necrosis of the right talus and for a long period of time was walking with a great deal of pain on the outside of her foot. After consulting several local doctors and podiatrist who would not consider her for surgery she was referred to orthopedics at [facility]. On 03/31/2020 she underwent an arthrodesis of the right ankle and an Achilles tenotomy. She apparently had stem cell harvest and infusion as part of this. She had surgeries on the lateral and medial part of her foot the lateral wound is healed she has been left with an open area which I think was a surgical wound dehiscence on the medial ankle. She was referred here by orthopedics at [facility] for ongoing wound care. She was put in a cam boot which she is still using in April. She also had some form of medicated wrap that she developed an acute allergic response to. The only thing we could see in looking through care everywhere was no allergy to zinc. She currently is using some form of silver impregnated Mepilex border. She has home health changing this. She had an x-ray done at [facility]. 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 Past medical history includes type 2 diabetes although the patient denies this she was told at [facility] that she was a diabetic. She is not currently on any treatment.  She also has hidradenitis listed but she denies this as well stating she does not even know what this is. She has gastroesophageal reflux disease hypertension. ABI in our clinic was 1.25 on the right 07/29/2020; patient came in today complaining about the amount of pain that was caused by the debridement last time. Fortunately the surface of her wound looks a lot better and no debridement was required. We have been using Iodoflex as a primary dressing to help with antibacterial effect and ongoing debridement. She still has the same probing tunnel to either bone, hardware or calcification. The hole is very tiny. Most of the rest of the surface of her wound looks fairly good 7/14; patient comes in with a wound a lot smaller. We have been using Iodoflex. There is no open area in the middle that I was able to identify. We still have not heard about epi fix Electronic Signature(s) Signed: 08/05/2020 4:30:53 PM By: Linton Ham MD Entered By: Linton Ham on 08/05/2020 16:26:04 -------------------------------------------------------------------------------- Physical Exam Details Patient Name: Date of Service: JASIYA, MARKIE 08/05/2020 3:30 PM Medical Record Number: [id number] Patient Account Number: [id number] Date of Birth/Sex: Treating RN: 03-05-1955 (65 y.o. [name] Primary Care Provider: Cathlean Cower Other Clinician: Referring Provider: Treating Provider/Extender: Jeri Modena in Treatment: 2 Constitutional Sitting or standing Blood Pressure is within target range for patient.. Pulse regular and within target range for patient.[name] Respirations regular, non-labored and within target range.. Temperature is normal and within the target range for the patient.[name] Appears in no distress. Notes Wound exam; the wound is on the medial part of  her right foot. Much better surface and smaller. I did not identify a probing area in the middle like last time. Granulation looks  very healthy. No evidence of surrounding infection no palpable hardware Electronic Signature(s) Signed: 08/05/2020 4:30:53 PM By: Linton Ham MD Entered By: Linton Ham on 08/05/2020 16:26:52 -------------------------------------------------------------------------------- Physician Orders Details Patient Name: Date of Service: JAENA, BROCATO 08/05/2020 3:30 PM Medical Record Number: [id number] Patient Account Number: [id number] Date of Birth/Sex: Treating RN: 1955-07-01 (65 y.o. [name] Primary Care Provider: Cathlean Cower Other Clinician: Referring Provider: Treating Provider/Extender: Jeri Modena in Treatment: 2 Verbal / Phone Orders: No Diagnosis Coding ICD-10 Coding Code Description T81.31XS Disruption of external operation (surgical) wound, not elsewhere classified, sequela L97.518 Non-pressure chronic ulcer of other part of right foot with other specified severity E11.622 Type 2 diabetes mellitus with other skin ulcer Follow-up Appointments ppointment in 1 week. - Dr. Dellia Nims Return A Cellular or Tissue Based Products Cellular or Tissue Based Product Type: - wound center to see if insurance will approve Epifix- still pending. Bathing/ Shower/ Hygiene May shower and wash wound with soap and water. - with dressing changes. Edema Control - Lymphedema / SCD / Other Elevate legs to the level of the heart or above for 30 minutes daily and/or when sitting, a frequency of: - throughout the day. Avoid standing for long periods of time. Home Health Wound #1 Right,Medial Foot No change in wound care orders this week; continue Home Health for wound care. May utilize formulary equivalent dressing for wound treatment orders unless otherwise specified. - Interim Home Health to change once a week. iodoflex primary dressing with abd pad secondary with a 3 layer compression wrap. Wound Treatment Wound #1 - Foot Wound Laterality: Right,  Medial Cleanser: Soap and Water (Home Health) 3 x Per Week/30 Days Discharge Instructions: May shower and wash wound with dial antibacterial soap and water prior to dressing change. Cleanser: Wound Cleanser (Home Health) 3 x Per Week/30 Days Discharge Instructions: Cleanse the wound with wound cleanser prior to applying a clean dressing using gauze sponges, not tissue or cotton balls. Peri-Wound Care: Triamcinolone 15 (g) 3 x Per Week/30 Days Discharge Instructions: ****Apply in office only***Use triamcinolone 15 (g) as directed Peri-Wound Care: Sween Lotion (Moisturizing lotion) (Home Health) 3 x Per Week/30 Days Discharge Instructions: Apply moisturizing lotion as directed Prim Dressing: IODOFLEX 0.9% Cadexomer Iodine Pad 4x6 cm (Home Health) 3 x Per Week/30 Days ary Discharge Instructions: ensure to get down into wound bed and depth. Secondary Dressing: Woven Gauze Sponge, Non-Sterile 4x4 in (Home Health) 3 x Per Week/30 Days Discharge Instructions: Apply over primary dressing as directed. Secondary Dressing: ABD Pad, 8x10 (Home Health) 3 x Per Week/30 Days Discharge Instructions: Apply over primary dressing as directed. Compression Wrap: ThreePress (3 layer compression wrap) (Home Health) 3 x Per Week/30 Days Discharge Instructions: Apply three layer compression as directed. Electronic Signature(s) Signed: 08/05/2020 4:30:53 PM By: Linton Ham MD Signed: 08/05/2020 6:14:19 PM By: Deon Pilling Entered By: Deon Pilling on 08/05/2020 16:21:11 -------------------------------------------------------------------------------- Problem List Details Patient Name: Date of Service: VALLORY, OETKEN 08/05/2020 3:30 PM Medical Record Number: [id number] Patient Account Number: [id number] Date of Birth/Sex: Treating RN: 01-08-1956 (65 y.o. [name] Primary Care Provider: Cathlean Cower Other Clinician: Referring Provider: Treating Provider/Extender: Jeri Modena in  Treatment: 2 Active Problems ICD-10 Encounter Code Description Active Date MDM Diagnosis T81.31XS Disruption of external operation (surgical) wound, not elsewhere classified, 07/22/2020 No Yes  sequela L97.518 Non-pressure chronic ulcer of other part of right foot with other specified 07/22/2020 No Yes severity E11.622 Type 2 diabetes mellitus with other skin ulcer 07/22/2020 No Yes Inactive Problems Resolved Problems Electronic Signature(s) Signed: 08/05/2020 4:30:53 PM By: Linton Ham MD Entered By: Linton Ham on 08/05/2020 16:25:14 -------------------------------------------------------------------------------- Progress Note Details Patient Name: Date of Service: [name] 08/05/2020 3:30 PM Medical Record Number: [id number] Patient Account Number: [id number] Date of Birth/Sex: Treating RN: [birth year] (65 y.o. [name] Primary Care Provider: Cathlean Cower Other Clinician: Referring Provider: Treating Provider/Extender: Jeri Modena in Treatment: 2 Subjective History of Present Illness (HPI) ADMISSION 07/22/2020 This is a 65 year old woman who is here accompanied by her sister-in-law. Her problem was avascular necrosis of the right talus and for a long period of time was walking with a great deal of pain on the outside of her foot. After consulting several local doctors and podiatrist who would not consider her for surgery she was referred to orthopedics at [facility]. On 03/31/2020 she underwent an arthrodesis of the right ankle and an Achilles tenotomy. She apparently had stem cell harvest and infusion as part of this. She had surgeries on the lateral and medial part of her foot the lateral wound is healed she has been left with an open area which I think was a surgical wound dehiscence on the medial ankle. She was referred here by orthopedics at [facility] for ongoing wound care. She was put in a cam boot which she is still using in April. She also  had some form of medicated wrap that she developed an acute allergic response to. The only thing we could see in looking through care everywhere was no allergy to zinc. She currently is using some form of silver impregnated Mepilex border. She has home health changing this. She had an x-ray done at [facility]. 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 Past medical history includes type 2 diabetes although the patient denies this she was told at [facility] that she was a diabetic. She is not currently on any treatment. She also has hidradenitis listed but she denies this as well stating she does not even know what this is. She has gastroesophageal reflux disease hypertension. ABI in our clinic was 1.25 on the right 07/29/2020; patient came in today complaining about the amount of pain that was caused by the debridement last time. Fortunately the surface of her wound looks a lot better and no debridement was required. We have been using Iodoflex as a primary dressing to help with antibacterial effect and ongoing debridement. She still has the same probing tunnel to either bone, hardware or calcification. The hole is very tiny. Most of the rest of the surface of her wound looks fairly good 7/14; patient comes in with a wound a lot smaller. We have been using Iodoflex. There is no open area in the middle that I was able to identify. We still have not heard about epi fix Objective Constitutional Sitting or standing Blood Pressure is within target range for patient.. Pulse regular and within target range for patient.[name] Respirations regular, non-labored and within target range.. Temperature is normal and within the target range for the patient.[name] Appears in no distress. Vitals Time Taken: 4:10 PM, Height: 67 in, Temperature: 99.0 F, Pulse: 101 bpm, Respiratory Rate: 20 breaths/min, Blood Pressure: 133/84 mmHg. General Notes: Wound  exam; the wound is on the medial part of her  right foot. Much better surface and smaller. I did not identify a probing area in the middle like last time. Granulation looks very healthy. No evidence of surrounding infection no palpable hardware Integumentary (Hair, Skin) Wound #1 status is Open. Original cause of wound was Surgical Injury. The date acquired was: 03/31/2020. The wound has been in treatment 2 weeks. The wound is located on the Right,Medial Foot. The wound measures 1.3cm length x 3cm width x 0.2cm depth; 3.063cm^2 area and 0.613cm^3 volume. There is Fat Layer (Subcutaneous Tissue) exposed. There is no tunneling or undermining noted. There is a medium amount of serosanguineous drainage noted. The wound margin is flat and intact. There is large (67-100%) red granulation within the wound bed. There is a small (1-33%) amount of necrotic tissue within the wound bed including Adherent Slough. Assessment Active Problems ICD-10 Disruption of external operation (surgical) wound, not elsewhere classified, sequela Non-pressure chronic ulcer of other part of right foot with other specified severity Type 2 diabetes mellitus with other skin ulcer Procedures Wound #1 Pre-procedure diagnosis of Wound #1 is an Open Surgical Wound located on the Right,Medial Foot . There was a Three Layer Compression Therapy Procedure by Deon Pilling, RN. Post procedure Diagnosis Wound #1: Same as Pre-Procedure Plan Follow-up Appointments: Return Appointment in 1 week. - Dr. Dellia Nims Cellular or Tissue Based Products: Cellular or Tissue Based Product Type: - wound center to see if insurance will approve Epifix- still pending. Bathing/ Shower/ Hygiene: May shower and wash wound with soap and water. - with dressing changes. Edema Control - Lymphedema / SCD / Other: Elevate legs to the level of the heart or above for 30 minutes daily and/or when sitting, a frequency of: - throughout the day. Avoid standing for long  periods of time. Home Health: Wound #1 Right,Medial Foot: No change in wound care orders this week; continue Home Health for wound care. May utilize formulary equivalent dressing for wound treatment orders unless otherwise specified. - Interim Home Health to change once a week. iodoflex primary dressing with abd pad secondary with a 3 layer compression wrap. WOUND #1: - Foot Wound Laterality: Right, Medial Cleanser: Soap and Water (Home Health) 3 x Per Week/30 Days Discharge Instructions: May shower and wash wound with dial antibacterial soap and water prior to dressing change. Cleanser: Wound Cleanser (Home Health) 3 x Per Week/30 Days Discharge Instructions: Cleanse the wound with wound cleanser prior to applying a clean dressing using gauze sponges, not tissue or cotton balls. Peri-Wound Care: Triamcinolone 15 (g) 3 x Per Week/30 Days Discharge Instructions: ****Apply in office only***Use triamcinolone 15 (g) as directed Peri-Wound Care: Sween Lotion (Moisturizing lotion) (Home Health) 3 x Per Week/30 Days Discharge Instructions: Apply moisturizing lotion as directed Prim Dressing: IODOFLEX 0.9% Cadexomer Iodine Pad 4x6 cm (Home Health) 3 x Per Week/30 Days ary Discharge Instructions: ensure to get down into wound bed and depth. Secondary Dressing: Woven Gauze Sponge, Non-Sterile 4x4 in (Home Health) 3 x Per Week/30 Days Discharge Instructions: Apply over primary dressing as directed. Secondary Dressing: ABD Pad, 8x10 (Home Health) 3 x Per Week/30 Days Discharge Instructions: Apply over primary dressing as directed. Com pression Wrap: ThreePress (3 layer compression wrap) (Home Health) 3 x Per Week/30 Days Discharge Instructions: Apply three layer compression as directed. 1. Wound looks a lot better and measures smaller. 2. No evidence of infection 3. I did not aggressively probe the surface of this wound this week however I will need to do that next week. She had an  area in the middle of  this wound last time however I did not see this this time. 4. We have epi fix through her insurance however we have not heard an answer to this yet but if things continue to improve at this rate we may not require this 5. Continue with Iodoflex for now Electronic Signature(s) Signed: 08/05/2020 4:30:53 PM By: Linton Ham MD Entered By: Linton Ham on 08/05/2020 16:28:01 -------------------------------------------------------------------------------- SuperBill Details Patient Name: Date of Service: [name] 08/05/2020 Medical Record Number: [id number] Patient Account Number: [id number] Date of Birth/Sex: Treating RN: Jun 03, 1955 (65 y.o. [name], Tammi Klippel Primary Care Provider: Cathlean Cower Other Clinician: Referring Provider: Treating Provider/Extender: Jeri Modena in Treatment: 2 Diagnosis Coding ICD-10 Codes Code Description T81.31XS Disruption of external operation (surgical) wound, not elsewhere classified, sequela L97.518 Non-pressure chronic ulcer of other part of right foot with other specified severity E11.622 Type 2 diabetes mellitus with other skin ulcer Facility Procedures CPT4 Code: [id number] Description: (Facility Use Only) [facility] Modifier: Quantity: 1 Physician Procedures Electronic Signature(s) Signed: 08/05/2020 4:30:53 PM By: Linton Ham MD Entered By: Linton Ham on 08/05/2020 48:25:00

## 2020-08-05 NOTE — Progress Notes (Addendum)
DEBRAH, GRANDERSON ([id number]) Visit Report for 08/05/2020 Arrival Information Details Patient Name: Date of Service: HARLY, PIPKINS 08/05/2020 3:30 PM Medical Record Number: [id number] Patient Account Number: [id number] Date of Birth/Sex: Treating RN: 1955-03-18 (65 y.o. Nancy Fetter Primary Care Morris Longenecker: Cathlean Cower Other Clinician: Referring Timmya Blazier: Treating Biff Rutigliano/Extender: Jeri Modena in Treatment: 2 Visit Information History Since Last Visit Added or deleted any medications: No Patient Arrived: Ambulatory Any new allergies or adverse reactions: No Arrival Time: 16:10 Had a fall or experienced change in No Accompanied By: family member activities of daily living that may affect Transfer Assistance: None risk of falls: Patient Identification Verified: Yes Signs or symptoms of abuse/neglect since last visito No Secondary Verification Process Completed: Yes Hospitalized since last visit: No Patient Requires Transmission-Based Precautions: No Implantable device outside of the clinic excluding No Patient Has Alerts: No cellular tissue based products placed in the center since last visit: Has Dressing in Place as Prescribed: Yes Pain Present Now: No Electronic Signature(s) Signed: 08/05/2020 6:01:10 PM By: Levan Hurst RN, BSN Entered By: Levan Hurst on 08/05/2020 16:10:52 -------------------------------------------------------------------------------- Compression Therapy Details Patient Name: Date of Service: [name] 08/05/2020 3:30 PM Medical Record Number: [id number] Patient Account Number: [id number] Date of Birth/Sex: Treating RN: 12/11/1955 (65 y.o. [name] Primary Care Masin Shatto: Cathlean Cower Other Clinician: Referring Emmaly Leech: Treating Nolyn Eilert/Extender: Jeri Modena in Treatment: 2 Compression Therapy Performed for Wound Assessment: Wound #1 Right,Medial Foot Performed By: Clinician Deon Pilling, RN Compression Type: Three Layer Post Procedure Diagnosis Same as Pre-procedure Electronic Signature(s) Signed: 08/05/2020 6:14:19 PM By: Deon Pilling Entered By: Deon Pilling on 08/05/2020 16:19:43 -------------------------------------------------------------------------------- Encounter Discharge Information Details Patient Name: Date of Service: [name] 08/05/2020 3:30 PM Medical Record Number: [id number] Patient Account Number: [id number] Date of Birth/Sex: Treating RN: 1955-10-07 (65 y.o. [name] Primary Care Kiahna Banghart: Cathlean Cower Other Clinician: Referring Paula Zietz: Treating Darsh Vandevoort/Extender: Jeri Modena in Treatment: 2 Encounter Discharge Information Items Discharge Condition: Stable Ambulatory Status: Walker Discharge Destination: Home Transportation: Private Auto Accompanied By: sister Schedule Follow-up Appointment: Yes Clinical Summary of Care: Electronic Signature(s) Signed: 08/05/2020 6:14:19 PM By: Deon Pilling Entered By: Deon Pilling on 08/05/2020 18:09:12 -------------------------------------------------------------------------------- Lower Extremity Assessment Details Patient Name: Date of Service: ZYNASIA, BURKLOW 08/05/2020 3:30 PM Medical Record Number: [id number] Patient Account Number: [id number] Date of Birth/Sex: Treating RN: October 19, 1955 (65 y.o. Nancy Fetter Primary Care Izella Ybanez: Cathlean Cower Other Clinician: Referring Sol Odor: Treating Henritta Mutz/Extender: Jeri Modena in Treatment: 2 Edema Assessment Assessed: Shirlyn Goltz: No] Patrice Paradise: No] Edema: [Left: Ye] [Right: s] Calf Left: Right: Point of Measurement: From Medial Instep 38 cm Ankle Left: Right: Point of Measurement: From Medial Instep 23.5 cm Vascular Assessment Pulses: Dorsalis Pedis Palpable: [Right:Yes] Electronic Signature(s) Signed: 08/05/2020 6:01:10 PM By: Levan Hurst RN, BSN Entered By: Levan Hurst on 08/05/2020 16:11:37 -------------------------------------------------------------------------------- Multi Wound Chart Details Patient Name: Date of Service: [name] 08/05/2020 3:30 PM Medical Record Number: [id number] Patient Account Number: [id number] Date of Birth/Sex: Treating RN: 1956-01-09 (65 y.o. [name] Primary Care Terez Montee: Cathlean Cower Other Clinician: Referring Shariff Lasky: Treating Teigen Bellin/Extender: Jeri Modena in Treatment: 2 Vital Signs Height(in): 67 Pulse(bpm): 101 Weight(lbs): Blood Pressure(mmHg): 133/84 Body Mass Index(BMI): Temperature(F): 99.0 Respiratory Rate(breaths/min): 20 Photos: [1:No Photos Right, Medial Foot] [N/A:N/A N/A] Wound Location: [1:Surgical Injury] [N/A:N/A] Wounding Event: [1:Open Surgical Wound] [N/A:N/A] Primary Etiology: [1:Cataracts, Anemia, Hypertension,] [N/A:N/A] Comorbid  History: [1:Type II Diabetes, Osteoarthritis 03/31/2020] [N/A:N/A] Date Acquired: [1:2] [N/A:N/A] Weeks of Treatment: [1:Open] [N/A:N/A] Wound Status: [1:1.3x3x0.2] [N/A:N/A] Measurements L x W x D (cm) [1:3.063] [N/A:N/A] A (cm) : rea [1:0.613] [N/A:N/A] Volume (cm) : [1:45.80%] [N/A:N/A] % Reduction in Area: [1:81.90%] [N/A:N/A] % Reduction in Volume: [1:Full Thickness With Exposed Support N/A] Classification: [1:Structures Medium] [N/A:N/A] Exudate Amount: [1:Serosanguineous] [N/A:N/A] Exudate Type: [1:red, brown] [N/A:N/A] Exudate Color: [1:Flat and Intact] [N/A:N/A] Wound Margin: [1:Large (67-100%)] [N/A:N/A] Granulation Amount: [1:Red] [N/A:N/A] Granulation Quality: [1:Small (1-33%)] [N/A:N/A] Necrotic Amount: [1:Fat Layer (Subcutaneous Tissue): Yes N/A] Exposed Structures: [1:Fascia: No Tendon: No Muscle: No Joint: No Bone: No Small (1-33%)] [N/A:N/A] Epithelialization: [1:Compression Therapy] [N/A:N/A] Treatment Notes Electronic Signature(s) Signed: 08/05/2020 4:30:53 PM By: Linton Ham  MD Signed: 08/05/2020 6:14:19 PM By: Deon Pilling Entered By: Linton Ham on 08/05/2020 16:25:24 -------------------------------------------------------------------------------- Multi-Disciplinary Care Plan Details Patient Name: Date of Service: [name] 08/05/2020 3:30 PM Medical Record Number: [id number] Patient Account Number: [id number] Date of Birth/Sex: Treating RN: 08-11-1955 (65 y.o. [name], [name] Primary Care Orvis Stann: Cathlean Cower Other Clinician: Referring Elick Aguilera: Treating Kathi Dohn/Extender: Jeri Modena in Treatment: 2 Active Inactive Nutrition Nursing Diagnoses: Potential for alteratiion in Nutrition/Potential for imbalanced nutrition Goals: Patient/caregiver agrees to and verbalizes understanding of need to obtain nutritional consultation Date Initiated: 07/22/2020 Target Resolution Date: 08/20/2020 Goal Status: Active Interventions: Provide education on nutrition Treatment Activities: Education provided on Nutrition : 07/29/2020 Patient referred to Primary Care Physician for further nutritional evaluation : 07/22/2020 Notes: Wound/Skin Impairment Nursing Diagnoses: Knowledge deficit related to ulceration/compromised skin integrity Goals: Patient/caregiver will verbalize understanding of skin care regimen Date Initiated: 07/22/2020 Target Resolution Date: 08/19/2020 Goal Status: Active Interventions: Assess patient/caregiver ability to obtain necessary supplies Assess patient/caregiver ability to perform ulcer/skin care regimen upon admission and as needed Provide education on ulcer and skin care Treatment Activities: Skin care regimen initiated : 07/22/2020 Topical wound management initiated : 07/22/2020 Notes: Electronic Signature(s) Signed: 08/05/2020 6:14:19 PM By: Deon Pilling Entered By: Deon Pilling on 08/05/2020 15:43:40 -------------------------------------------------------------------------------- Pain Assessment  Details Patient Name: Date of Service: SHEREECE, WELLBORN 08/05/2020 3:30 PM Medical Record Number: [id number] Patient Account Number: [id number] Date of Birth/Sex: Treating RN: 1955-08-15 (65 y.o. Nancy Fetter Primary Care Jentzen Minasyan: Cathlean Cower Other Clinician: Referring Naeemah Jasmer: Treating Darilyn Storbeck/Extender: Jeri Modena in Treatment: 2 Active Problems Location of Pain Severity and Description of Pain Patient Has Paino No Site Locations Pain Management and Medication Current Pain Management: Electronic Signature(s) Signed: 08/05/2020 6:01:10 PM By: Levan Hurst RN, BSN Entered By: Levan Hurst on 08/05/2020 16:11:23 -------------------------------------------------------------------------------- Patient/Caregiver Education Details Patient Name: Date of Service: [name] 7/14/2022andnbsp3:30 PM Medical Record Number: [id number] Patient Account Number: [id number] Date of Birth/Gender: Treating RN: [birth year] (65 y.o. [name] Primary Care Physician: Cathlean Cower Other Clinician: Referring Physician: Treating Physician/Extender: Jeri Modena in Treatment: 2 Education Assessment Education Provided To: Patient Education Topics Provided Wound/Skin Impairment: Handouts: Skin Care Do's and Dont's Methods: Explain/Verbal Responses: Reinforcements needed Electronic Signature(s) Signed: 08/05/2020 6:14:19 PM By: Deon Pilling Entered By: Deon Pilling on 08/05/2020 15:43:55 -------------------------------------------------------------------------------- Wound Assessment Details Patient Name: Date of Service: CIJI, BOSTON 08/05/2020 3:30 PM Medical Record Number: [id number] Patient Account Number: [id number] Date of Birth/Sex: Treating RN: 1955/02/23 (65 y.o. Nancy Fetter Primary Care Kryslyn Helbig: Cathlean Cower Other Clinician: Referring Laroy Mustard: Treating Viktoria Gruetzmacher/Extender: Jeri Modena in Treatment: 2 Wound Status Wound Number: 1 Primary Open Surgical Wound  Etiology: Wound Location: Right, Medial Foot Wound Status: Open Wounding Event: Surgical Injury Comorbid Cataracts, Anemia, Hypertension, Type II Diabetes, Date Acquired: 03/31/2020 History: Osteoarthritis Weeks Of Treatment: 2 Clustered Wound: No Photos Wound Measurements Length: (cm) 1.3 Width: (cm) 3 Depth: (cm) 0.2 Area: (cm) 3.063 Volume: (cm) 0.613 % Reduction in Area: 45.8% % Reduction in Volume: 81.9% Epithelialization: Small (1-33%) Tunneling: No Undermining: No Wound Description Classification: Full Thickness With Exposed Support Structures Wound Margin: Flat and Intact Exudate Amount: Medium Exudate Type: Serosanguineous Exudate Color: red, brown Foul Odor After Cleansing: No Slough/Fibrino Yes Wound Bed Granulation Amount: Large (67-100%) Exposed Structure Granulation Quality: Red Fascia Exposed: No Necrotic Amount: Small (1-33%) Fat Layer (Subcutaneous Tissue) Exposed: Yes Necrotic Quality: Adherent Slough Tendon Exposed: No Muscle Exposed: No Joint Exposed: No Bone Exposed: No Treatment Notes Wound #1 (Foot) Wound Laterality: Right, Medial Cleanser Soap and Water Discharge Instruction: May shower and wash wound with dial antibacterial soap and water prior to dressing change. Wound Cleanser Discharge Instruction: Cleanse the wound with wound cleanser prior to applying a clean dressing using gauze sponges, not tissue or cotton balls. Peri-Wound Care Triamcinolone 15 (g) Discharge Instruction: ****Apply in office only***Use triamcinolone 15 (g) as directed Sween Lotion (Moisturizing lotion) Discharge Instruction: Apply moisturizing lotion as directed Topical Primary Dressing IODOFLEX 0.9% Cadexomer Iodine Pad 4x6 cm Discharge Instruction: ensure to get down into wound bed and depth. Secondary Dressing Woven Gauze Sponge, Non-Sterile 4x4 in Discharge  Instruction: Apply over primary dressing as directed. ABD Pad, 8x10 Discharge Instruction: Apply over primary dressing as directed. Secured With Compression Wrap ThreePress (3 layer compression wrap) Discharge Instruction: Apply three layer compression as directed. Compression Stockings Add-Ons Electronic Signature(s) Signed: 08/09/2020 3:49:49 PM By: Sandre Kitty Signed: 08/09/2020 5:04:52 PM By: Levan Hurst RN, BSN Previous Signature: 08/05/2020 6:01:10 PM Version By: Levan Hurst RN, BSN Entered By: Sandre Kitty on 08/06/2020 11:57:12 -------------------------------------------------------------------------------- Vitals Details Patient Name: Date of Service: MADILYN, CEPHAS 08/05/2020 3:30 PM Medical Record Number: [id number] Patient Account Number: [id number] Date of Birth/Sex: Treating RN: [birth year] (65 y.o. Nancy Fetter Primary Care Eriq Hufford: Cathlean Cower Other Clinician: Referring Naziah Portee: Treating Jaishaun Mcnab/Extender: Jeri Modena in Treatment: 2 Vital Signs Time Taken: 16:10 Temperature (F): 99.0 Height (in): 67 Pulse (bpm): 101 Respiratory Rate (breaths/min): 20 Blood Pressure (mmHg): 133/84 Reference Range: 80 - 120 mg / dl Electronic Signature(s) Signed: 08/05/2020 6:01:10 PM By: Levan Hurst RN, BSN Entered By: Levan Hurst on 08/05/2020 16:11:17

## 2020-08-12 ENCOUNTER — Other Ambulatory Visit: Payer: Self-pay

## 2020-08-12 ENCOUNTER — Encounter (HOSPITAL_BASED_OUTPATIENT_CLINIC_OR_DEPARTMENT_OTHER): Payer: Managed Care, Other (non HMO) | Admitting: Internal Medicine

## 2020-08-12 DIAGNOSIS — E11621 Type 2 diabetes mellitus with foot ulcer: Secondary | ICD-10-CM | POA: Diagnosis not present

## 2020-08-12 NOTE — Progress Notes (Signed)
AMBERROSE, FRIEBEL ([id number]) Visit Report for 08/12/2020 HPI Details Patient Name: Date of Service: YISEL, MEGILL 08/12/2020 10:15 A M Medical Record Number: [id number] Patient Account Number: [id number] Date of Birth/Sex: Treating RN: [birth year] (65 y.o. [name] Primary Care Provider: Cathlean Cower Other Clinician: Referring Provider: Treating Provider/Extender: Jeri Modena in Treatment: 3 History of Present Illness HPI Description: ADMISSION 07/22/2020 This is a 65 year old woman who is here accompanied by her sister-in-law. Her problem was avascular necrosis of the right talus and for a long period of time was walking with a great deal of pain on the outside of her foot. After consulting several local doctors and podiatrist who would not consider her for surgery she was referred to orthopedics at [facility]. On 03/31/2020 she underwent an arthrodesis of the right ankle and an Achilles tenotomy. She apparently had stem cell harvest and infusion as part of this. She had surgeries on the lateral and medial part of her foot the lateral wound is healed she has been left with an open area which I think was a surgical wound dehiscence on the medial ankle. She was referred here by orthopedics at [facility] for ongoing wound care. She was put in a cam boot which she is still using in April. She also had some form of medicated wrap that she developed an acute allergic response to. The only thing we could see in looking through care everywhere was no allergy to zinc. She currently is using some form of silver impregnated Mepilex border. She has home health changing this. She had an x-ray done at [facility]. 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 Past medical history includes type 2 diabetes although the patient denies this she was told at [facility] that she was a diabetic. She is not currently on  any treatment. She also has hidradenitis listed but she denies this as well stating she does not even know what this is. She has gastroesophageal reflux disease hypertension. ABI in our clinic was 1.25 on the right 07/29/2020; patient came in today complaining about the amount of pain that was caused by the debridement last time. Fortunately the surface of her wound looks a lot better and no debridement was required. We have been using Iodoflex as a primary dressing to help with antibacterial effect and ongoing debridement. She still has the same probing tunnel to either bone, hardware or calcification. The hole is very tiny. Most of the rest of the surface of her wound looks fairly good 7/14; patient comes in with a wound a lot smaller. We have been using Iodoflex. There is no open area in the middle that I was able to identify. We still have not heard about epi fix 7/21; we continue to make nice progress here. I do not think the epiffix will be necessary Electronic Signature(s) Signed: 08/12/2020 5:24:41 PM By: Linton Ham MD Entered By: Linton Ham on 08/12/2020 12:46:28 -------------------------------------------------------------------------------- Physical Exam Details Patient Name: Date of Service: JULIANY, DAUGHETY 08/12/2020 10:15 A M Medical Record Number: [id number] Patient Account Number: [id number] Date of Birth/Sex: Treating RN: [birth year] (65 y.o. [name] Primary Care Provider: Cathlean Cower Other Clinician: Referring Provider: Treating Provider/Extender: Jeri Modena in Treatment: 3 Constitutional Sitting or standing Blood Pressure is within target range for patient.. Pulse regular and within target range for patient.[name] Respirations regular, non-labored and within target range.. Temperature is normal and within the target  range for the patient.. Notes Wound exam; the areas on the medial part of her right foot. Very healthy looking surface.  Nice improvements in wound dimensions I carefully probed the surface of the wound looking for the small tunnel I could not identify that this was still present. There is no purulence nothing required culturing no debridement Electronic Signature(s) Signed: 08/12/2020 5:24:41 PM By: Linton Ham MD Entered By: Linton Ham on 08/12/2020 12:47:20 -------------------------------------------------------------------------------- Physician Orders Details Patient Name: Date of Service: ZOEE, HEENEY 08/12/2020 10:15 A M Medical Record Number: [id number] Patient Account Number: [id number] Date of Birth/Sex: Treating RN: 13-Feb-1955 (65 y.o. [name] Primary Care Provider: Cathlean Cower Other Clinician: Referring Provider: Treating Provider/Extender: Jeri Modena in Treatment: 3 Verbal / Phone Orders: No Diagnosis Coding ICD-10 Coding Code Description T81.31XS Disruption of external operation (surgical) wound, not elsewhere classified, sequela L97.518 Non-pressure chronic ulcer of other part of right foot with other specified severity E11.622 Type 2 diabetes mellitus with other skin ulcer Follow-up Appointments Return appointment in 1 month. - Dr. Dellia Nims 09/09/2020 Please call home health to update wound care orders for weekly changes x3 weeks. Call Wound center if home health is no longer coming out we need to know. Please call if any concerns wound center will see if able to get you in to see a provider. Bathing/ Shower/ Hygiene May shower and wash wound with soap and water. - with dressing changes. Edema Control - Lymphedema / SCD / Other Elevate legs to the level of the heart or above for 30 minutes daily and/or when sitting, a frequency of: - throughout the day. Avoid standing for long periods of time. Home Health Wound #1 Right,Medial Foot New wound care orders this week; continue Home Health for wound care. May utilize formulary equivalent dressing  for wound treatment orders unless otherwise specified. - Interim Home Health to change once a week. change to Silver Collagen primary dressing with abd pad secondary with a 3 layer compression wrap. Wound Treatment Wound #1 - Foot Wound Laterality: Right, Medial Cleanser: Soap and Water (Home Health) 1 x Per Week/30 Days Discharge Instructions: May shower and wash wound with dial antibacterial soap and water prior to dressing change. Cleanser: Wound Cleanser (Home Health) 1 x Per Week/30 Days Discharge Instructions: Cleanse the wound with wound cleanser prior to applying a clean dressing using gauze sponges, not tissue or cotton balls. Peri-Wound Care: Triamcinolone 15 (g) 1 x Per Week/30 Days Discharge Instructions: ****Apply in office only***Use triamcinolone 15 (g) as directed Peri-Wound Care: Sween Lotion (Moisturizing lotion) (Home Health) 1 x Per Week/30 Days Discharge Instructions: Apply moisturizing lotion as directed Prim Dressing: Promogran Prisma Matrix, 4.34 (sq in) (silver collagen) 1 x Per Week/30 Days ary Discharge Instructions: Moisten collagen with saline or hydrogel Secondary Dressing: Woven Gauze Sponge, Non-Sterile 4x4 in (Home Health) 1 x Per Week/30 Days Discharge Instructions: Apply over primary dressing as directed. Secondary Dressing: ABD Pad, 8x10 (Home Health) 1 x Per Week/30 Days Discharge Instructions: Apply over primary dressing as directed. Compression Wrap: ThreePress (3 layer compression wrap) (Home Health) 1 x Per Week/30 Days Discharge Instructions: Apply three layer compression as directed. Electronic Signature(s) Signed: 08/12/2020 5:24:41 PM By: Linton Ham MD Signed: 08/12/2020 6:31:47 PM By: Deon Pilling Entered By: Deon Pilling on 08/12/2020 11:54:37 -------------------------------------------------------------------------------- Problem List Details Patient Name: Date of Service: LATRELLE, FUSTON 08/12/2020 10:15 A M Medical Record Number:  [id number] Patient Account Number: [id number] Date of Birth/Sex: Treating RN:  1955-04-07 (65 y.o. [name], Tammi Klippel Primary Care Provider: Cathlean Cower Other Clinician: Referring Provider: Treating Provider/Extender: Jeri Modena in Treatment: 3 Active Problems ICD-10 Encounter Code Description Active Date MDM Diagnosis T81.31XS Disruption of external operation (surgical) wound, not elsewhere classified, 07/22/2020 No Yes sequela L97.518 Non-pressure chronic ulcer of other part of right foot with other specified 07/22/2020 No Yes severity E11.622 Type 2 diabetes mellitus with other skin ulcer 07/22/2020 No Yes Inactive Problems Resolved Problems Electronic Signature(s) Signed: 08/12/2020 5:24:41 PM By: Linton Ham MD Entered By: Linton Ham on 08/12/2020 12:42:25 -------------------------------------------------------------------------------- Progress Note Details Patient Name: Date of Service: [name] 08/12/2020 10:15 A M Medical Record Number: [id number] Patient Account Number: [id number] Date of Birth/Sex: Treating RN: [birth year] (65 y.o. [name] Primary Care Provider: Cathlean Cower Other Clinician: Referring Provider: Treating Provider/Extender: Jeri Modena in Treatment: 3 Subjective History of Present Illness (HPI) ADMISSION 07/22/2020 This is a 65 year old woman who is here accompanied by her sister-in-law. Her problem was avascular necrosis of the right talus and for a long period of time was walking with a great deal of pain on the outside of her foot. After consulting several local doctors and podiatrist who would not consider her for surgery she was referred to orthopedics at [facility]. On 03/31/2020 she underwent an arthrodesis of the right ankle and an Achilles tenotomy. She apparently had stem cell harvest and infusion as part of this. She had surgeries on the lateral and medial part of her foot the lateral  wound is healed she has been left with an open area which I think was a surgical wound dehiscence on the medial ankle. She was referred here by orthopedics at [facility] for ongoing wound care. She was put in a cam boot which she is still using in April. She also had some form of medicated wrap that she developed an acute allergic response to. The only thing we could see in looking through care everywhere was no allergy to zinc. She currently is using some form of silver impregnated Mepilex border. She has home health changing this. She had an x-ray done at [facility]. 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 Past medical history includes type 2 diabetes although the patient denies this she was told at [facility] that she was a diabetic. She is not currently on any treatment. She also has hidradenitis listed but she denies this as well stating she does not even know what this is. She has gastroesophageal reflux disease hypertension. ABI in our clinic was 1.25 on the right 07/29/2020; patient came in today complaining about the amount of pain that was caused by the debridement last time. Fortunately the surface of her wound looks a lot better and no debridement was required. We have been using Iodoflex as a primary dressing to help with antibacterial effect and ongoing debridement. She still has the same probing tunnel to either bone, hardware or calcification. The hole is very tiny. Most of the rest of the surface of her wound looks fairly good 7/14; patient comes in with a wound a lot smaller. We have been using Iodoflex. There is no open area in the middle that I was able to identify. We still have not heard about epi fix 7/21; we continue to make nice progress here. I do not think the epiffix will be necessary Objective Constitutional Sitting or standing Blood Pressure is within target range for patient.[name]  Pulse regular and within  target range for patient.[name] Respirations regular, non-labored and within target range.. Temperature is normal and within the target range for the patient.. Vitals Time Taken: 11:14 AM, Height: 67 in, Source: Stated, Temperature: 98.1 F, Pulse: 85 bpm, Respiratory Rate: 20 breaths/min, Blood Pressure: 115/63 mmHg. General Notes: Wound exam; the areas on the medial part of her right foot. Very healthy looking surface. Nice improvements in wound dimensions I carefully probed the surface of the wound looking for the small tunnel I could not identify that this was still present. There is no purulence nothing required culturing no debridement Integumentary (Hair, Skin) Wound #1 status is Open. Original cause of wound was Surgical Injury. The date acquired was: 03/31/2020. The wound has been in treatment 3 weeks. The wound is located on the Right,Medial Foot. The wound measures 0.8cm length x 0.7cm width x 0.2cm depth; 0.44cm^2 area and 0.088cm^3 volume. There is Fat Layer (Subcutaneous Tissue) exposed. There is no tunneling or undermining noted. There is a small amount of serosanguineous drainage noted. The wound margin is flat and intact. There is large (67-100%) red granulation within the wound bed. There is a small (1-33%) amount of necrotic tissue within the wound bed including Adherent Slough. Assessment Active Problems ICD-10 Disruption of external operation (surgical) wound, not elsewhere classified, sequela Non-pressure chronic ulcer of other part of right foot with other specified severity Type 2 diabetes mellitus with other skin ulcer Procedures Wound #1 Pre-procedure diagnosis of Wound #1 is an Open Surgical Wound located on the Right,Medial Foot . There was a Three Layer Compression Therapy Procedure by Levan Hurst, RN. Post procedure Diagnosis Wound #1: Same as Pre-Procedure Plan Follow-up Appointments: Return appointment in 1 month. - Dr. Dellia Nims 09/09/2020 Please call home health  to update wound care orders for weekly changes x3 weeks. Call Wound center if home health is no longer coming out we need to know. Please call if any concerns wound center will see if able to get you in to see a provider. Bathing/ Shower/ Hygiene: May shower and wash wound with soap and water. - with dressing changes. Edema Control - Lymphedema / SCD / Other: Elevate legs to the level of the heart or above for 30 minutes daily and/or when sitting, a frequency of: - throughout the day. Avoid standing for long periods of time. Home Health: Wound #1 Right,Medial Foot: New wound care orders this week; continue Home Health for wound care. May utilize formulary equivalent dressing for wound treatment orders unless otherwise specified. - Interim Home Health to change once a week. change to Silver Collagen primary dressing with abd pad secondary with a 3 layer compression wrap. WOUND #1: - Foot Wound Laterality: Right, Medial Cleanser: Soap and Water (Home Health) 1 x Per Week/30 Days Discharge Instructions: May shower and wash wound with dial antibacterial soap and water prior to dressing change. Cleanser: Wound Cleanser (Home Health) 1 x Per Week/30 Days Discharge Instructions: Cleanse the wound with wound cleanser prior to applying a clean dressing using gauze sponges, not tissue or cotton balls. Peri-Wound Care: Triamcinolone 15 (g) 1 x Per Week/30 Days Discharge Instructions: ****Apply in office only***Use triamcinolone 15 (g) as directed Peri-Wound Care: Sween Lotion (Moisturizing lotion) (Home Health) 1 x Per Week/30 Days Discharge Instructions: Apply moisturizing lotion as directed Prim Dressing: Promogran Prisma Matrix, 4.34 (sq in) (silver collagen) 1 x Per Week/30 Days ary Discharge Instructions: Moisten collagen with saline or hydrogel Secondary Dressing: Woven Gauze Sponge, Non-Sterile 4x4 in (Home  Health) 1 x Per Week/30 Days Discharge Instructions: Apply over primary dressing as  directed. Secondary Dressing: ABD Pad, 8x10 (Home Health) 1 x Per Week/30 Days Discharge Instructions: Apply over primary dressing as directed. Com pression Wrap: ThreePress (3 layer compression wrap) (Home Health) 1 x Per Week/30 Days Discharge Instructions: Apply three layer compression as directed. 1. I change the dressing to silver collagen from Iodoflex. 2. There is no deep undermining area here. This is obviously closed over 3. I did not feel there was a need for epi ffix and we can cancel this request which was already becoming an Advertising account planner) Signed: 08/12/2020 5:24:41 PM By: Linton Ham MD Entered By: Linton Ham on 08/12/2020 12:49:09 -------------------------------------------------------------------------------- SuperBill Details Patient Name: Date of Service: [name] 08/12/2020 Medical Record Number: [id number] Patient Account Number: [id number] Date of Birth/Sex: Treating RN: Jun 11, 1955 (65 y.o. [name], Tammi Klippel Primary Care Provider: Cathlean Cower Other Clinician: Referring Provider: Treating Provider/Extender: Jeri Modena in Treatment: 3 Diagnosis Coding ICD-10 Codes Code Description T81.31XS Disruption of external operation (surgical) wound, not elsewhere classified, sequela L97.518 Non-pressure chronic ulcer of other part of right foot with other specified severity E11.622 Type 2 diabetes mellitus with other skin ulcer Facility Procedures CPT4 Code: [id number] Description: (Facility Use Only) [place] [facility] LEG Modifier: Quantity: 1 Physician Procedures Electronic Signature(s) Signed: 08/12/2020 5:24:41 PM By: Linton Ham MD Signed: 08/12/2020 6:31:47 PM By: Deon Pilling Entered By: Deon Pilling on 08/12/2020 17:13:13

## 2020-08-16 NOTE — Progress Notes (Signed)
AIKO, CROMWELL ([id number]) Visit Report for 08/12/2020 Arrival Information Details Patient Name: Date of Service: NIKYLA, PRIEM 08/12/2020 10:15 A M Medical Record Number: [id number] Patient Account Number: [id number] Date of Birth/Sex: Treating RN: 1955/02/02 (65 y.o. Elam Dutch Primary Care Drucella Karbowski: Cathlean Cower Other Clinician: Referring Zakariya Knickerbocker: Treating Awa Bachicha/Extender: Jeri Modena in Treatment: 3 Visit Information History Since Last Visit Added or deleted any medications: No Patient Arrived: [name] Any new allergies or adverse reactions: No Arrival Time: 11:08 Had a fall or experienced change in No Accompanied By: sister in law activities of daily living that may affect Transfer Assistance: None risk of falls: Patient Identification Verified: Yes Signs or symptoms of abuse/neglect since No Secondary Verification Process Completed: Yes last visito Patient Requires Transmission-Based Precautions: No Hospitalized since last visit: No Patient Has Alerts: No Implantable device outside of the clinic No excluding cellular tissue based products placed in the center since last visit: Has Dressing in Place as Prescribed: Yes Has Compression in Place as Prescribed: Yes Has Footwear/Offloading in Place as Yes Prescribed: Right: Surgical Shoe with Pressure Relief Insole Pain Present Now: No Electronic Signature(s) Signed: 08/12/2020 6:07:52 PM By: Baruch Gouty RN, BSN Entered By: Baruch Gouty on 08/12/2020 11:14:01 -------------------------------------------------------------------------------- Compression Therapy Details Patient Name: Date of Service: [name] 08/12/2020 10:15 A M Medical Record Number: [id number] Patient Account Number: [id number] Date of Birth/Sex: Treating RN: [birth year] (65 y.o. [name] Primary Care Comer Devins: Cathlean Cower Other Clinician: Referring Jaleeah Slight: Treating Percell Lamboy/Extender: Jeri Modena in Treatment: 3 Compression Therapy Performed for Wound Assessment: Wound #1 Right,Medial Foot Performed By: Clinician Levan Hurst, RN Compression Type: Three Layer Post Procedure Diagnosis Same as Pre-procedure Electronic Signature(s) Signed: 08/12/2020 6:31:47 PM By: Deon Pilling Entered By: Deon Pilling on 08/12/2020 11:47:00 -------------------------------------------------------------------------------- Encounter Discharge Information Details Patient Name: Date of Service: NEELAH, LEVEE 08/12/2020 10:15 A M Medical Record Number: [id number] Patient Account Number: [id number] Date of Birth/Sex: Treating RN: 12-Apr-1955 (65 y.o. Nancy Fetter Primary Care Kathryne Ramella: Cathlean Cower Other Clinician: Referring Amory Zbikowski: Treating Dajana Gehrig/Extender: Jeri Modena in Treatment: 3 Encounter Discharge Information Items Discharge Condition: Stable Ambulatory Status: Walker Discharge Destination: Home Transportation: Private Auto Schedule Follow-up Appointment: Yes Clinical Summary of Care: Patient Declined Electronic Signature(s) Signed: 08/16/2020 4:44:07 PM By: Levan Hurst RN, BSN Entered By: Levan Hurst on 08/12/2020 13:30:48 -------------------------------------------------------------------------------- Lower Extremity Assessment Details Patient Name: Date of Service: CONCEPTION, MACNEILL 08/12/2020 10:15 A M Medical Record Number: [id number] Patient Account Number: [id number] Date of Birth/Sex: Treating RN: 1955-02-28 (65 y.o. Elam Dutch Primary Care Taytum Wheller: Cathlean Cower Other Clinician: Referring Vannak Montenegro: Treating Juan Olthoff/Extender: Jeri Modena in Treatment: 3 Edema Assessment Assessed: Shirlyn Goltz: No] [Right: No] Edema: [Left: Ye] [Right: s] Calf Left: Right: Point of Measurement: From Medial Instep 38 cm Ankle Left: Right: Point of Measurement: From Medial Instep 22.5 cm Vascular  Assessment Pulses: Dorsalis Pedis Palpable: [Right:Yes] Electronic Signature(s) Signed: 08/12/2020 6:07:52 PM By: Baruch Gouty RN, BSN Entered By: Baruch Gouty on 08/12/2020 11:22:45 -------------------------------------------------------------------------------- Multi-Disciplinary Care Plan Details Patient Name: Date of Service: [name] 08/12/2020 10:15 A M Medical Record Number: [id number] Patient Account Number: [id number] Date of Birth/Sex: Treating RN: [birth year] (65 y.o. [name] Primary Care Keesha Pellum: Cathlean Cower Other Clinician: Referring Hubert Raatz: Treating Leigha Olberding/Extender: Jeri Modena in Treatment: 3 Active Inactive Nutrition Nursing Diagnoses: Potential for alteratiion in Nutrition/Potential for imbalanced nutrition Goals: Patient/caregiver agrees  to and verbalizes understanding of need to obtain nutritional consultation Date Initiated: 07/22/2020 T arget Resolution Date: 09/16/2020 Goal Status: Active Interventions: Provide education on nutrition Treatment Activities: Education provided on Nutrition : 07/29/2020 Patient referred to Primary Care Physician for further nutritional evaluation : 07/22/2020 Notes: Wound/Skin Impairment Nursing Diagnoses: Knowledge deficit related to ulceration/compromised skin integrity Goals: Patient/caregiver will verbalize understanding of skin care regimen Date Initiated: 07/22/2020 Target Resolution Date: 09/17/2020 Goal Status: Active Interventions: Assess patient/caregiver ability to obtain necessary supplies Assess patient/caregiver ability to perform ulcer/skin care regimen upon admission and as needed Provide education on ulcer and skin care Treatment Activities: Skin care regimen initiated : 07/22/2020 Topical wound management initiated : 07/22/2020 Notes: Electronic Signature(s) Signed: 08/12/2020 6:31:47 PM By: Deon Pilling Entered By: Deon Pilling on 08/12/2020  11:14:30 -------------------------------------------------------------------------------- Pain Assessment Details Patient Name: Date of Service: ADYN, ASKINS 08/12/2020 10:15 A M Medical Record Number: [id number] Patient Account Number: [id number] Date of Birth/Sex: Treating RN: [birth year] (65 y.o. Elam Dutch Primary Care Adewale Pucillo: Cathlean Cower Other Clinician: Referring Jasimine Simms: Treating Levon Penning/Extender: Jeri Modena in Treatment: 3 Active Problems Location of Pain Severity and Description of Pain Patient Has Paino No Site Locations Rate the pain. Current Pain Level: 0 Pain Management and Medication Current Pain Management: Electronic Signature(s) Signed: 08/12/2020 6:07:52 PM By: Baruch Gouty RN, BSN Entered By: Baruch Gouty on 08/12/2020 11:15:56 -------------------------------------------------------------------------------- Patient/Caregiver Education Details Patient Name: Date of Service: [name] 7/21/2022andnbsp10:15 A M Medical Record Number: [id number] Patient Account Number: [id number] Date of Birth/Gender: Treating RN: 1955-09-06 (65 y.o. [name] Primary Care Physician: Cathlean Cower Other Clinician: Referring Physician: Treating Physician/Extender: Jeri Modena in Treatment: 3 Education Assessment Education Provided To: Patient Education Topics Provided Wound/Skin Impairment: Handouts: Skin Care Do's and Dont's Methods: Explain/Verbal Responses: Reinforcements needed Electronic Signature(s) Signed: 08/12/2020 6:31:47 PM By: Deon Pilling Entered By: Deon Pilling on 08/12/2020 11:14:41 -------------------------------------------------------------------------------- Wound Assessment Details Patient Name: Date of Service: SARIBEL, DEWAR 08/12/2020 10:15 A M Medical Record Number: [id number] Patient Account Number: [id number] Date of Birth/Sex: Treating RN: 1955-11-17 (65  y.o. Elam Dutch Primary Care Cherylynn Liszewski: Cathlean Cower Other Clinician: Referring Gerrald Basu: Treating Yisroel Mullendore/Extender: Jeri Modena in Treatment: 3 Wound Status Wound Number: 1 Primary Open Surgical Wound Etiology: Wound Location: Right, Medial Foot Wound Status: Open Wounding Event: Surgical Injury Comorbid Cataracts, Anemia, Hypertension, Type II Diabetes, Date Acquired: 03/31/2020 History: Osteoarthritis Weeks Of Treatment: 3 Clustered Wound: No Photos Photo Uploaded By: Donavan Burnet on 08/13/2020 08:42:30 Wound Measurements Length: (cm) 0.8 Width: (cm) 0.7 Depth: (cm) 0.2 Area: (cm) 0.44 Volume: (cm) 0.088 % Reduction in Area: 92.2% % Reduction in Volume: 97.4% Epithelialization: Small (1-33%) Tunneling: No Undermining: No Wound Description Classification: Full Thickness With Exposed Support Structures Wound Margin: Flat and Intact Exudate Amount: Small Exudate Type: Serosanguineous Exudate Color: red, brown Foul Odor After Cleansing: No Slough/Fibrino Yes Wound Bed Granulation Amount: Large (67-100%) Exposed Structure Granulation Quality: Red Fascia Exposed: No Necrotic Amount: Small (1-33%) Fat Layer (Subcutaneous Tissue) Exposed: Yes Necrotic Quality: Adherent Slough Tendon Exposed: No Muscle Exposed: No Joint Exposed: No Bone Exposed: No Treatment Notes Wound #1 (Foot) Wound Laterality: Right, Medial Cleanser Soap and Water Discharge Instruction: May shower and wash wound with dial antibacterial soap and water prior to dressing change. Wound Cleanser Discharge Instruction: Cleanse the wound with wound cleanser prior to applying a clean dressing using gauze sponges, not tissue or cotton balls. Peri-Wound Care Triamcinolone  15 (g) Discharge Instruction: ****Apply in office only***Use triamcinolone 15 (g) as directed Sween Lotion (Moisturizing lotion) Discharge Instruction: Apply moisturizing lotion as  directed Topical Primary Dressing Promogran Prisma Matrix, 4.34 (sq in) (silver collagen) Discharge Instruction: Moisten collagen with saline or hydrogel Secondary Dressing Woven Gauze Sponge, Non-Sterile 4x4 in Discharge Instruction: Apply over primary dressing as directed. ABD Pad, 8x10 Discharge Instruction: Apply over primary dressing as directed. Secured With Compression Wrap ThreePress (3 layer compression wrap) Discharge Instruction: Apply three layer compression as directed. Compression Stockings Add-Ons Electronic Signature(s) Signed: 08/12/2020 6:07:52 PM By: Baruch Gouty RN, BSN Entered By: Baruch Gouty on 08/12/2020 11:26:25 -------------------------------------------------------------------------------- Vitals Details Patient Name: Date of Service: [name] 08/12/2020 10:15 A M Medical Record Number: [id number] Patient Account Number: [id number] Date of Birth/Sex: Treating RN: [birth year] (65 y.o. Elam Dutch Primary Care Nazareth Kirk: Cathlean Cower Other Clinician: Referring Alee Katen: Treating Lakasha Mcfall/Extender: Jeri Modena in Treatment: 3 Vital Signs Time Taken: 11:14 Temperature (F): 98.1 Height (in): 67 Pulse (bpm): 85 Source: Stated Respiratory Rate (breaths/min): 20 Blood Pressure (mmHg): 115/63 Reference Range: 80 - 120 mg / dl Electronic Signature(s) Signed: 08/12/2020 6:07:52 PM By: Baruch Gouty RN, BSN Entered By: Baruch Gouty on 08/12/2020 11:15:48

## 2020-09-09 ENCOUNTER — Other Ambulatory Visit: Payer: Self-pay

## 2020-09-09 ENCOUNTER — Encounter (HOSPITAL_BASED_OUTPATIENT_CLINIC_OR_DEPARTMENT_OTHER): Payer: Managed Care, Other (non HMO) | Attending: Internal Medicine | Admitting: Internal Medicine

## 2020-09-09 DIAGNOSIS — I872 Venous insufficiency (chronic) (peripheral): Secondary | ICD-10-CM | POA: Insufficient documentation

## 2020-09-09 DIAGNOSIS — I10 Essential (primary) hypertension: Secondary | ICD-10-CM | POA: Diagnosis not present

## 2020-09-09 DIAGNOSIS — L97518 Non-pressure chronic ulcer of other part of right foot with other specified severity: Secondary | ICD-10-CM | POA: Insufficient documentation

## 2020-09-09 DIAGNOSIS — E11621 Type 2 diabetes mellitus with foot ulcer: Secondary | ICD-10-CM | POA: Insufficient documentation

## 2020-09-09 DIAGNOSIS — X58XXXS Exposure to other specified factors, sequela: Secondary | ICD-10-CM | POA: Insufficient documentation

## 2020-09-09 DIAGNOSIS — T8131XS Disruption of external operation (surgical) wound, not elsewhere classified, sequela: Secondary | ICD-10-CM | POA: Diagnosis present

## 2020-09-09 NOTE — Progress Notes (Signed)
PEARLA, HOLTSCLAW ([id number]) Visit Report for 09/09/2020 HPI Details Patient Name: Date of Service: Jodi Keller, Jodi Keller 09/09/2020 3:00 PM Medical Record Number: [id number] Patient Account Number: [id number] Date of Birth/Sex: Treating RN: 20-May-1955 (65 y.o. Jodi Keller Primary Care Provider: Cathlean Cower Other Clinician: Referring Provider: Treating Provider/Extender: Jeri Modena in Treatment: 7 History of Present Illness HPI Description: ADMISSION 07/22/2020 This is a 65 year old woman who is here accompanied by her sister-in-law. Her problem was avascular necrosis of the right talus and for a long period of time was walking with a great deal of pain on the outside of her foot. After consulting several local doctors and podiatrist who would not consider her for surgery she was referred to orthopedics at [facility] On 03/31/2020 she underwent an arthrodesis of the right ankle and an Achilles tenotomy. She apparently had stem cell harvest and infusion as part of this. She had surgeries on the lateral and medial part of her foot the lateral wound is healed she has been left with an open area which I think was a surgical wound dehiscence on the medial ankle. She was referred here by orthopedics at [facility] for ongoing wound care. She was put in a cam boot which she is still using in April. She also had some form of medicated wrap that she developed an acute allergic response to. The only thing we could see in looking through care everywhere was no allergy to zinc. She currently is using some form of silver impregnated Mepilex border. She has home health changing this. She had an x-ray done at [facility]. 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 Past medical history includes type 2 diabetes although the patient denies this she was told at [facility] that she was a diabetic. She is not currently on any treatment.  She also has hidradenitis listed but she denies this as well stating she does not even know what this is. She has gastroesophageal reflux disease hypertension. ABI in our clinic was 1.25 on the right 07/29/2020; patient came in today complaining about the amount of pain that was caused by the debridement last time. Fortunately the surface of her wound looks a lot better and no debridement was required. We have been using Iodoflex as a primary dressing to help with antibacterial effect and ongoing debridement. She still has the same probing tunnel to either bone, hardware or calcification. The hole is very tiny. Most of the rest of the surface of her wound looks fairly good 7/14; patient comes in with a wound a lot smaller. We have been using Iodoflex. There is no open area in the middle that I was able to identify. We still have not heard about epi fix 7/21; we continue to make nice progress here. I do not think the epiffix will be necessary 8/18; I thought the wound might be closed today very small open area. Apparently she went back to [facility] yesterday they told her that she did not need to be wrapped, no dressing, good go back to her ordinary shoes etc. She has some degree of chronic venous insufficiency and the wound is not closed Electronic Signature(s) Signed: 09/09/2020 4:52:40 PM By: Linton Ham MD Entered By: Linton Ham on 09/09/2020 16:17:34 -------------------------------------------------------------------------------- Physical Exam Details Patient Name: Date of Service: Jodi Keller. 09/09/2020 3:00 PM Medical Record Number: [id number] Patient Account Number: [id number] Date of Birth/Sex: Treating RN: [birth year] (65 y.o. F) [place], [place]  Primary Care Provider: Cathlean Cower Other Clinician: Referring Provider: Treating Provider/Extender: Jeri Modena in Treatment: 7 Constitutional Patient is hypertensive.. Pulse regular and within target range for  patient.[name] Respirations regular, non-labored and within target range.. Temperature is normal and within the target range for the patient.[name] Appears in no distress. Notes Wound exam; the area on the medial part of her right foot. Small wound healthy looking. This is come down quite a bit since I last saw this 3 weeks ago. There is no evidence of surrounding infection. She still has edema in her leg although this does not look too bad. No edema around her ankle is seen Electronic Signature(s) Signed: 09/09/2020 4:52:40 PM By: Linton Ham MD Entered By: Linton Ham on 09/09/2020 16:18:29 -------------------------------------------------------------------------------- Physician Orders Details Patient Name: Date of Service: Jodi Keller. 09/09/2020 3:00 PM Medical Record Number: [id number] Patient Account Number: [id number] Date of Birth/Sex: Treating RN: 1955-01-31 (65 y.o. Jodi Keller Primary Care Provider: Cathlean Cower Other Clinician: Referring Provider: Treating Provider/Extender: Jeri Modena in Treatment: 7 Verbal / Phone Orders: No Diagnosis Coding ICD-10 Coding Code Description T81.31XS Disruption of external operation (surgical) wound, not elsewhere classified, sequela L97.518 Non-pressure chronic ulcer of other part of right foot with other specified severity E11.622 Type 2 diabetes mellitus with other skin ulcer Follow-up Appointments ppointment in 2 weeks. - Thursday Dr. Dellia Nims Return A Bathing/ Shower/ Hygiene May shower and wash wound with soap and water. - with dressing changes. Edema Control - Lymphedema / SCD / Other Elevate legs to the level of the heart or above for 30 minutes daily and/or when sitting, a frequency of: - throughout the day. Avoid standing for long periods of time. Exercise regularly Moisturize legs daily. - lotion both legs every night before bed. Compression stocking or Garment 20-30 mm/Hg pressure to: - Purchase  and apply in the morning and remove at night. Off-Loading Other: - may wear walking shoes, no Crocs or shoes that rub the area. Wound Treatment Wound #1 - Foot Wound Laterality: Right, Medial Cleanser: Soap and Water (Home Health) 3 x Per Week/30 Days Discharge Instructions: May shower and wash wound with dial antibacterial soap and water prior to dressing change. Prim Dressing: Promogran Prisma Matrix, 4.34 (sq in) (silver collagen) 3 x Per Week/30 Days ary Discharge Instructions: Moisten collagen with saline or hydrogel Secondary Dressing: Zetuvit Plus Silicone Border Dressing 4x4 (in/in) 3 x Per Week/30 Days Discharge Instructions: Apply silicone border over primary dressing as directed. Electronic Signature(s) Signed: 09/09/2020 4:52:40 PM By: Linton Ham MD Signed: 09/09/2020 6:11:30 PM By: Deon Pilling Entered By: Deon Pilling on 09/09/2020 16:12:02 -------------------------------------------------------------------------------- Problem List Details Patient Name: Date of Service: ZIQI, REIMER 09/09/2020 3:00 PM Medical Record Number: [id number] Patient Account Number: [id number] Date of Birth/Sex: Treating RN: 02-19-1955 (66 y.o. [name], Tammi Klippel Primary Care Provider: Cathlean Cower Other Clinician: Referring Provider: Treating Provider/Extender: Jeri Modena in Treatment: 7 Active Problems ICD-10 Encounter Code Description Active Date MDM Diagnosis T81.31XS Disruption of external operation (surgical) wound, not elsewhere classified, 07/22/2020 No Yes sequela L97.518 Non-pressure chronic ulcer of other part of right foot with other specified 07/22/2020 No Yes severity E11.622 Type 2 diabetes mellitus with other skin ulcer 07/22/2020 No Yes Inactive Problems Resolved Problems Electronic Signature(s) Signed: 09/09/2020 4:52:40 PM By: Linton Ham MD Entered By: Linton Ham on 09/09/2020  16:16:17 -------------------------------------------------------------------------------- Progress Note Details Patient Name: Date of Service: Lottie Rater P. 09/09/2020 3:00 PM  Medical Record Number: [id number] Patient Account Number: [id number] Date of Birth/Sex: Treating RN: 09/12/1955 (65 y.o. Jodi Keller Primary Care Provider: Cathlean Cower Other Clinician: Referring Provider: Treating Provider/Extender: Jeri Modena in Treatment: 7 Subjective History of Present Illness (HPI) ADMISSION 07/22/2020 This is a 65 year old woman who is here accompanied by her sister-in-law. Her problem was avascular necrosis of the right talus and for a long period of time was walking with a great deal of pain on the outside of her foot. After consulting several local doctors and podiatrist who would not consider her for surgery she was referred to orthopedics at [facility]. On 03/31/2020 she underwent an arthrodesis of the right ankle and an Achilles tenotomy. She apparently had stem cell harvest and infusion as part of this. She had surgeries on the lateral and medial part of her foot the lateral wound is healed she has been left with an open area which I think was a surgical wound dehiscence on the medial ankle. She was referred here by orthopedics at [facility] for ongoing wound care. She was put in a cam boot which she is still using in April. She also had some form of medicated wrap that she developed an acute allergic response to. The only thing we could see in looking through care everywhere was no allergy to zinc. She currently is using some form of silver impregnated Mepilex border. She has home health changing this. She had an x-ray done at [facility]. 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 Past medical history includes type 2 diabetes although the patient denies this she was told at [facility] that she was a  diabetic. She is not currently on any treatment. She also has hidradenitis listed but she denies this as well stating she does not even know what this is. She has gastroesophageal reflux disease hypertension. ABI in our clinic was 1.25 on the right 07/29/2020; patient came in today complaining about the amount of pain that was caused by the debridement last time. Fortunately the surface of her wound looks a lot better and no debridement was required. We have been using Iodoflex as a primary dressing to help with antibacterial effect and ongoing debridement. She still has the same probing tunnel to either bone, hardware or calcification. The hole is very tiny. Most of the rest of the surface of her wound looks fairly good 7/14; patient comes in with a wound a lot smaller. We have been using Iodoflex. There is no open area in the middle that I was able to identify. We still have not heard about epi fix 7/21; we continue to make nice progress here. I do not think the epiffix will be necessary 8/18; I thought the wound might be closed today very small open area. Apparently she went back to [facility] yesterday they told her that she did not need to be wrapped, no dressing, good go back to her ordinary shoes etc. She has some degree of chronic venous insufficiency and the wound is not closed Objective Constitutional Patient is hypertensive.. Pulse regular and within target range for patient.[name] Respirations regular, non-labored and within target range.. Temperature is normal and within the target range for the patient.[name] Appears in no distress. Vitals Time Taken: 3:43 PM, Height: 67 in, Temperature: 98.4 F, Pulse: 81 bpm, Respiratory Rate: 20 breaths/min, Blood Pressure: 152/82 mmHg. General Notes: Wound exam; the area on the medial part of her right foot. Small wound healthy  looking. This is come down quite a bit since I last saw this 3 weeks ago. There is no evidence of surrounding infection. She still has  edema in her leg although this does not look too bad. No edema around her ankle is seen Integumentary (Hair, Skin) Wound #1 status is Open. Original cause of wound was Surgical Injury. The date acquired was: 03/31/2020. The wound has been in treatment 7 weeks. The wound is located on the Right,Medial Foot. The wound measures 0.1cm length x 0.1cm width x 0.1cm depth; 0.008cm^2 area and 0.001cm^3 volume. There is Fat Layer (Subcutaneous Tissue) exposed. There is no tunneling or undermining noted. There is a small amount of serosanguineous drainage noted. The wound margin is flat and intact. There is large (67-100%) red granulation within the wound bed. There is no necrotic tissue within the wound bed. Assessment Active Problems ICD-10 Disruption of external operation (surgical) wound, not elsewhere classified, sequela Non-pressure chronic ulcer of other part of right foot with other specified severity Type 2 diabetes mellitus with other skin ulcer Plan Follow-up Appointments: Return Appointment in 2 weeks. - Thursday Dr. Dellia Nims Bathing/ Shower/ Hygiene: May shower and wash wound with soap and water. - with dressing changes. Edema Control - Lymphedema / SCD / Other: Elevate legs to the level of the heart or above for 30 minutes daily and/or when sitting, a frequency of: - throughout the day. Avoid standing for long periods of time. Exercise regularly Moisturize legs daily. - lotion both legs every night before bed. Compression stocking or Garment 20-30 mm/Hg pressure to: - Purchase and apply in the morning and remove at night. Off-Loading: Other: - may wear walking shoes, no Crocs or shoes that rub the area. WOUND #1: - Foot Wound Laterality: Right, Medial Cleanser: Soap and Water (Home Health) 3 x Per Week/30 Days Discharge Instructions: May shower and wash wound with dial antibacterial soap and water prior to dressing change. Prim Dressing: Promogran Prisma Matrix, 4.34 (sq in) (silver  collagen) 3 x Per Week/30 Days ary Discharge Instructions: Moisten collagen with saline or hydrogel Secondary Dressing: Zetuvit Plus Silicone Border Dressing 4x4 (in/in) 3 x Per Week/30 Days Discharge Instructions: Apply silicone border over primary dressing as directed. 1. I still wanted to continue with the silver collagen. I think not dressing this is a Cavaliere. Foam cover 2. I told the patient that if she develops increasing edema with activity in her legs this could adversely affect this wound and she is going to need to call us. 3. I think she would benefit from 20/30 compression stockings Electronic Signature(s) Signed: 09/09/2020 4:52:40 PM By: Linton Ham MD Entered By: Linton Ham on 09/09/2020 16:20:32 -------------------------------------------------------------------------------- SuperBill Details Patient Name: Date of Service: Jodi Keller. 09/09/2020 Medical Record Number: [id number] Patient Account Number: [id number] Date of Birth/Sex: Treating RN: April 17, 1955 (65 y.o. [name], Tammi Klippel Primary Care Provider: Cathlean Cower Other Clinician: Referring Provider: Treating Provider/Extender: Jeri Modena in Treatment: 7 Diagnosis Coding ICD-10 Codes Code Description T81.31XS Disruption of external operation (surgical) wound, not elsewhere classified, sequela L97.518 Non-pressure chronic ulcer of other part of right foot with other specified severity E11.622 Type 2 diabetes mellitus with other skin ulcer Facility Procedures CPT4 Code: [id number] Description: 99213 - WOUND CARE VISIT-LEV 3 EST PT Modifier: Quantity: 1 Physician Procedures : CPT4 Code Description Modifier [id number] - WC PHYS LEVEL 3 - EST PT ICD-10 Diagnosis Description T81.31XS Disruption of external operation (surgical) wound, not elsewhere classified, sequela L97.518 Non-pressure chronic  ulcer of other part of right  foot with other specified severity E11.622 Type 2  diabetes mellitus with other skin ulcer Quantity: 1 Electronic Signature(s) Signed: 09/09/2020 4:52:40 PM By: Linton Ham MD Entered By: Linton Ham on 09/09/2020 16:20:53

## 2020-09-09 NOTE — Progress Notes (Addendum)
ROYALTI, DEWATERS ([id number]) Visit Report for 09/09/2020 Arrival Information Details Patient Name: Date of Service: Jodi Keller, Jodi Keller 09/09/2020 3:00 PM Medical Record Number: [id number] Patient Account Number: [id number] Date of Birth/Sex: Treating RN: [birth year] (65 y.o. Jodi Keller, Jodi Keller Primary Care Jodi Keller: Jodi Keller Other Clinician: Referring Jodi Keller: Treating Rheana Casebolt/Extender: Jodi Keller in Treatment: 7 Visit Information History Since Last Visit Added or deleted any medications: No Patient Arrived: Ambulatory Any new allergies or adverse reactions: No Arrival Time: 15:41 Had a fall or experienced change in No Accompanied By: daughter activities of daily living that may affect Transfer Assistance: None risk of falls: Patient Identification Verified: Yes Signs or symptoms of abuse/neglect since last visito No Secondary Verification Process Completed: Yes Hospitalized since last visit: No Patient Requires Transmission-Based Precautions: No Implantable device outside of the clinic excluding No Patient Has Alerts: No cellular tissue based products placed in the center since last visit: Has Dressing in Place as Prescribed: Yes Pain Present Now: No Notes Per patient seen ortho at [facility], said no longer needs compression, can wear shoes, and can wash area. MD made aware. Electronic Signature(s) Signed: 09/09/2020 6:11:30 PM By: Jodi Keller Entered By: Jodi Keller on 09/09/2020 15:59:14 -------------------------------------------------------------------------------- Clinic Level of Care Assessment Details Patient Name: Date of Service: Jodi Keller, Jodi Keller 09/09/2020 3:00 PM Medical Record Number: [id number] Patient Account Number: [id number] Date of Birth/Sex: Treating RN: 05/30/1955 (65 y.o. Jodi Keller, Jodi Keller Primary Care Jodi Keller: Jodi Keller Other Clinician: Referring Jodi Keller: Treating Jodi Keller/Extender: Jodi Keller in  Treatment: 7 Clinic Level of Care Assessment Items TOOL 4 Quantity Score X- 1 0 Use when only an EandM is performed on FOLLOW-UP visit ASSESSMENTS - Nursing Assessment / Reassessment X- 1 10 Reassessment of Co-morbidities (includes updates in patient status) X- 1 5 Reassessment of Adherence to Treatment Plan ASSESSMENTS - Wound and Skin A ssessment / Reassessment '[]'$  - 0 Simple Wound Assessment / Reassessment - one wound X- 1 5 Complex Wound Assessment / Reassessment - multiple wounds X- 1 10 Dermatologic / Skin Assessment (not related to wound area) ASSESSMENTS - Focused Assessment X- 1 5 Circumferential Edema Measurements - multi extremities X- 1 10 Nutritional Assessment / Counseling / Intervention '[]'$  - 0 Lower Extremity Assessment (monofilament, tuning fork, pulses) '[]'$  - 0 Peripheral Arterial Disease Assessment (using hand held doppler) ASSESSMENTS - Ostomy and/or Continence Assessment and Care '[]'$  - 0 Incontinence Assessment and Management '[]'$  - 0 Ostomy Care Assessment and Management (repouching, etc.) PROCESS - Coordination of Care '[]'$  - 0 Simple Patient / Family Education for ongoing care X- 1 20 Complex (extensive) Patient / Family Education for ongoing care '[]'$  - 0 Staff obtains Programmer, systems, Records, T Results / Process Orders est '[]'$  - 0 Staff telephones HHA, Nursing Homes / Clarify orders / etc '[]'$  - 0 Routine Transfer to another Facility (non-emergent condition) '[]'$  - 0 Routine Hospital Admission (non-emergent condition) '[]'$  - 0 New Admissions / Biomedical engineer / Ordering NPWT Apligraf, etc. , '[]'$  - 0 Emergency Hospital Admission (emergent condition) '[]'$  - 0 Simple Discharge Coordination X- 1 15 Complex (extensive) Discharge Coordination PROCESS - Special Needs '[]'$  - 0 Pediatric / Minor Patient Management '[]'$  - 0 Isolation Patient Management '[]'$  - 0 Hearing / Language / Visual special needs '[]'$  - 0 Assessment of Community assistance (transportation,  D/C planning, etc.) '[]'$  - 0 Additional assistance / Altered mentation '[]'$  - 0 Support Surface(s) Assessment (bed, cushion, seat, etc.) INTERVENTIONS - Wound Cleansing / Measurement '[]'$  -  0 Simple Wound Cleansing - one wound X- 1 5 Complex Wound Cleansing - multiple wounds X- 1 5 Wound Imaging (photographs - any number of wounds) '[]'$  - 0 Wound Tracing (instead of photographs) '[]'$  - 0 Simple Wound Measurement - one wound X- 1 5 Complex Wound Measurement - multiple wounds INTERVENTIONS - Wound Dressings X - Small Wound Dressing one or multiple wounds 1 10 '[]'$  - 0 Medium Wound Dressing one or multiple wounds '[]'$  - 0 Large Wound Dressing one or multiple wounds '[]'$  - 0 Application of Medications - topical '[]'$  - 0 Application of Medications - injection INTERVENTIONS - Miscellaneous '[]'$  - 0 External ear exam '[]'$  - 0 Specimen Collection (cultures, biopsies, blood, body fluids, etc.) '[]'$  - 0 Specimen(s) / Culture(s) sent or taken to Lab for analysis '[]'$  - 0 Patient Transfer (multiple staff / Civil Service fast streamer / Similar devices) '[]'$  - 0 Simple Staple / Suture removal (25 or less) '[]'$  - 0 Complex Staple / Suture removal (26 or more) '[]'$  - 0 Hypo / Hyperglycemic Management (close monitor of Blood Glucose) '[]'$  - 0 Ankle / Brachial Index (ABI) - do not check if billed separately X- 1 5 Vital Signs Has the patient been seen at the hospital within the last three years: Yes Total Score: 110 Level Of Care: New/Established - Level 3 Electronic Signature(s) Signed: 09/09/2020 6:11:30 PM By: Jodi Keller Entered By: Jodi Keller on 09/09/2020 16:10:56 -------------------------------------------------------------------------------- Encounter Discharge Information Details Patient Name: Date of Service: Jodi Friends. 09/09/2020 3:00 PM Medical Record Number: [id number] Patient Account Number: [id number] Date of Birth/Sex: Treating RN: 1955-09-20 (65 y.o. Jodi Keller Primary Care Alejo Beamer: Jodi Keller Other Clinician: Referring Jodi Keller: Treating Jodi Keller/Extender: Jodi Keller in Treatment: 7 Encounter Discharge Information Items Discharge Condition: Stable Ambulatory Status: Wheelchair Discharge Destination: Home Transportation: Private Auto Accompanied By: daughter Schedule Follow-up Appointment: Yes Clinical Summary of Care: Electronic Signature(s) Signed: 09/09/2020 6:11:30 PM By: Jodi Keller Entered By: Jodi Keller on 09/09/2020 17:35:15 -------------------------------------------------------------------------------- Lower Extremity Assessment Details Patient Name: Date of Service: Jodi Keller, Jodi Keller 09/09/2020 3:00 PM Medical Record Number: [id number] Patient Account Number: [id number] Date of Birth/Sex: Treating RN: Aug 23, 1955 (65 y.o. Jodi Keller Primary Care Letisia Schwalb: Jodi Keller Other Clinician: Referring Nalayah Hitt: Treating Shadell Brenn/Extender: Jodi Keller in Treatment: 7 Edema Assessment Assessed: Shirlyn Goltz: No] Patrice Paradise: Yes] Edema: [Left: Ye] [Right: s] Calf Left: Right: Point of Measurement: From Medial Instep 37 cm Ankle Left: Right: Point of Measurement: From Medial Instep 22 cm Knee To Floor Left: Right: From Medial Instep 42 cm Vascular Assessment Pulses: Dorsalis Pedis Palpable: [Right:Yes] Electronic Signature(s) Signed: 09/09/2020 6:11:30 PM By: Jodi Keller Entered By: Jodi Keller on 09/09/2020 16:11:35 -------------------------------------------------------------------------------- Multi Wound Chart Details Patient Name: Date of Service: Jodi Friends. 09/09/2020 3:00 PM Medical Record Number: [id number] Patient Account Number: [id number] Date of Birth/Sex: Treating RN: 07/30/1955 (65 y.o. Jodi Keller Primary Care Everardo Voris: Jodi Keller Other Clinician: Referring Charlissa Petros: Treating Trendon Zaring/Extender: Jodi Keller in Treatment: 7 Vital Signs Height(in):  74 Pulse(bpm): 14 Weight(lbs): Blood Pressure(mmHg): 152/82 Body Mass Index(BMI): Temperature(F): 98.4 Respiratory Rate(breaths/min): 20 Photos: [N/A:N/A] Right, Medial Foot N/A N/A Wound Location: Surgical Injury N/A N/A Wounding Event: Open Surgical Wound N/A N/A Primary Etiology: Cataracts, Anemia, Hypertension, N/A N/A Comorbid History: Type II Diabetes, Osteoarthritis 03/31/2020 N/A N/A Date Acquired: 7 N/A N/A Weeks of Treatment: Open N/A N/A Wound Status: 0.1x0.1x0.1 N/A N/A Measurements L x W x D (cm) 0.008 N/A  N/A A (cm) : rea 0.001 N/A N/A Volume (cm) : 99.90% N/A N/A % Reduction in Area: 100.00% N/A N/A % Reduction in Volume: Full Thickness With Exposed Support N/A N/A Classification: Structures Small N/A N/A Exudate Amount: Serosanguineous N/A N/A Exudate Type: red, brown N/A N/A Exudate Color: Flat and Intact N/A N/A Wound Margin: Large (67-100%) N/A N/A Granulation Amount: Red N/A N/A Granulation Quality: None Present (0%) N/A N/A Necrotic Amount: Fat Layer (Subcutaneous Tissue): Yes N/A N/A Exposed Structures: Fascia: No Tendon: No Muscle: No Joint: No Bone: No Small (1-33%) N/A N/A Epithelialization: Treatment Notes Electronic Signature(s) Signed: 09/09/2020 4:52:40 PM By: Linton Ham MD Signed: 09/09/2020 6:11:30 PM By: Jodi Keller Entered By: Linton Ham on 09/09/2020 16:16:24 -------------------------------------------------------------------------------- Multi-Disciplinary Care Plan Details Patient Name: Date of Service: Jodi Friends. 09/09/2020 3:00 PM Medical Record Number: [id number] Patient Account Number: [id number] Date of Birth/Sex: Treating RN: 07/25/1955 (65 y.o. Jodi Keller Primary Care Kayliee Atienza: Jodi Keller Other Clinician: Referring Tykerria Mccubbins: Treating Karyss Frese/Extender: Jodi Keller in Treatment: 7 Active Inactive Electronic Signature(s) Signed: 10/21/2020 5:43:51 PM  By: Jodi Pilling RN, BSN Previous Signature: 09/09/2020 6:11:30 PM Version By: Jodi Keller Entered By: Jodi Keller on 09/23/2020 15:35:28 -------------------------------------------------------------------------------- Pain Assessment Details Patient Name: Date of Service: Jodi Keller, Jodi Keller 09/09/2020 3:00 PM Medical Record Number: [id number] Patient Account Number: [id number] Date of Birth/Sex: Treating RN: 11/07/1955 (65 y.o. Jodi Keller Primary Care Nikoloz Huy: Jodi Keller Other Clinician: Referring Kamiryn Bezanson: Treating Shafiq Larch/Extender: Jodi Keller in Treatment: 7 Active Problems Location of Pain Severity and Description of Pain Patient Has Paino No Site Locations Pain Management and Medication Current Pain Management: Electronic Signature(s) Signed: 09/09/2020 6:11:30 PM By: Jodi Keller Entered By: Jodi Keller on 09/09/2020 15:43:41 -------------------------------------------------------------------------------- Patient/Caregiver Education Details Patient Name: Date of Service: Jodi Friends 8/18/2022andnbsp3:00 PM Medical Record Number: [id number] Patient Account Number: [id number] Date of Birth/Gender: Treating RN: 1955-07-21 (65 y.o. Jodi Keller Primary Care Physician: Jodi Keller Other Clinician: Referring Physician: Treating Physician/Extender: Jodi Keller in Treatment: 7 Education Assessment Education Provided To: Patient Education Topics Provided Nutrition: Handouts: Nutrition Methods: Explain/Verbal Responses: Reinforcements needed Electronic Signature(s) Signed: 09/09/2020 6:11:30 PM By: Jodi Keller Entered By: Jodi Keller on 09/09/2020 15:59:52 -------------------------------------------------------------------------------- Wound Assessment Details Patient Name: Date of Service: Jodi Keller, Jodi Keller 09/09/2020 3:00 PM Medical Record Number: [id number] Patient Account Number:  [id number] Date of Birth/Sex: Treating RN: Apr 23, 1955 (65 y.o. Jodi Keller, Jodi Keller Primary Care Zi Sek: Jodi Keller Other Clinician: Referring Square Jowett: Treating Lillee Mooneyhan/Extender: Jodi Keller in Treatment: 7 Wound Status Wound Number: 1 Primary Open Surgical Wound Etiology: Wound Location: Right, Medial Foot Wound Status: Open Wounding Event: Surgical Injury Comorbid Cataracts, Anemia, Hypertension, Type II Diabetes, Date Acquired: 03/31/2020 History: Osteoarthritis Weeks Of Treatment: 7 Clustered Wound: No Photos Wound Measurements Length: (cm) 0.1 Width: (cm) 0.1 Depth: (cm) 0.1 Area: (cm) 0.008 Volume: (cm) 0.001 % Reduction in Area: 99.9% % Reduction in Volume: 100% Epithelialization: Small (1-33%) Tunneling: No Undermining: No Wound Description Classification: Full Thickness With Exposed Support Structures Wound Margin: Flat and Intact Exudate Amount: Small Exudate Type: Serosanguineous Exudate Color: red, brown Foul Odor After Cleansing: No Slough/Fibrino No Wound Bed Granulation Amount: Large (67-100%) Exposed Structure Granulation Quality: Red Fascia Exposed: No Necrotic Amount: None Present (0%) Fat Layer (Subcutaneous Tissue) Exposed: Yes Tendon Exposed: No Muscle Exposed: No Joint Exposed: No Bone Exposed: No Electronic Signature(s) Signed: 09/09/2020 6:11:30 PM By: Jodi Keller Entered By: Rolin Barry  Bobbi on 09/09/2020 15:58:14 -------------------------------------------------------------------------------- Vitals Details Patient Name: Date of Service: Jodi Keller, Jodi Keller 09/09/2020 3:00 PM Medical Record Number: [id number] Patient Account Number: [id number] Date of Birth/Sex: Treating RN: Apr 08, 1955 (65 y.o. Jodi Keller, Tammi Klippel Primary Care Azar South: Jodi Keller Other Clinician: Referring Kristine Tiley: Treating Amberle Lyter/Extender: Jodi Keller in Treatment: 7 Vital Signs Time Taken: 15:43 Temperature (F):  98.4 Height (in): 67 Pulse (bpm): 81 Respiratory Rate (breaths/min): 20 Blood Pressure (mmHg): 152/82 Reference Range: 80 - 120 mg / dl Electronic Signature(s) Signed: 09/09/2020 6:11:30 PM By: Jodi Keller Entered By: Jodi Keller on 09/09/2020 15:43:30

## 2020-09-23 ENCOUNTER — Other Ambulatory Visit: Payer: Self-pay

## 2020-09-23 ENCOUNTER — Encounter (HOSPITAL_BASED_OUTPATIENT_CLINIC_OR_DEPARTMENT_OTHER): Payer: Managed Care, Other (non HMO) | Attending: Internal Medicine | Admitting: Internal Medicine

## 2020-09-23 DIAGNOSIS — I890 Lymphedema, not elsewhere classified: Secondary | ICD-10-CM | POA: Insufficient documentation

## 2020-09-23 DIAGNOSIS — E11621 Type 2 diabetes mellitus with foot ulcer: Secondary | ICD-10-CM | POA: Insufficient documentation

## 2020-09-23 DIAGNOSIS — I872 Venous insufficiency (chronic) (peripheral): Secondary | ICD-10-CM | POA: Insufficient documentation

## 2020-09-23 DIAGNOSIS — L97518 Non-pressure chronic ulcer of other part of right foot with other specified severity: Secondary | ICD-10-CM | POA: Insufficient documentation

## 2020-09-23 DIAGNOSIS — E1151 Type 2 diabetes mellitus with diabetic peripheral angiopathy without gangrene: Secondary | ICD-10-CM | POA: Diagnosis not present

## 2020-09-23 DIAGNOSIS — E11622 Type 2 diabetes mellitus with other skin ulcer: Secondary | ICD-10-CM | POA: Diagnosis present

## 2020-09-23 NOTE — Progress Notes (Signed)
Jodi Keller, Jodi Keller ([id number]) Visit Report for 09/23/2020 HPI Details Patient Name: Date of Service: Jodi Keller, Jodi Keller 09/23/2020 2:30 PM Medical Record Number: [id number] Patient Account Number: [id number] Date of Birth/Sex: Treating RN: December 14, 1955 (65 y.o. Jodi Keller Primary Care Provider: Cathlean Keller Other Clinician: Referring Provider: Treating Provider/Extender: Jeri Modena in Treatment: 9 History of Present Illness HPI Description: ADMISSION 07/22/2020 This is a 65 year old woman who is here accompanied by her sister-in-law. Her problem was avascular necrosis of the right talus and for a long period of time was walking with a great deal of pain on the outside of her foot. After consulting several local doctors and podiatrist who would not consider her for surgery she was referred to orthopedics at [facility]. On 03/31/2020 she underwent an arthrodesis of the right ankle and an Achilles tenotomy. She apparently had stem cell harvest and infusion as part of this. She had surgeries on the lateral and medial part of her foot the lateral wound is healed she has been left with an open area which I think was a surgical wound dehiscence on the medial ankle. She was referred here by orthopedics at [facility] for ongoing wound care. She was put in a cam boot which she is still using in April. She also had some form of medicated wrap that she developed an acute allergic response to. The only thing we could see in looking through care everywhere was no allergy to zinc. She currently is using some form of silver impregnated Mepilex border. She has home health changing this. She had an x-ray done at [facility]. 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 Past medical history includes type 2 diabetes although the patient denies this she was told at [facility] that she was a diabetic. She is not currently on any treatment.  She also has hidradenitis listed but she denies this as well stating she does not even know what this is. She has gastroesophageal reflux disease hypertension. ABI in our clinic was 1.25 on the right 07/29/2020; patient came in today complaining about the amount of pain that was caused by the debridement last time. Fortunately the surface of her wound looks a lot better and no debridement was required. We have been using Iodoflex as a primary dressing to help with antibacterial effect and ongoing debridement. She still has the same probing tunnel to either bone, hardware or calcification. The hole is very tiny. Most of the rest of the surface of her wound looks fairly good 7/14; patient comes in with a wound a lot smaller. We have been using Iodoflex. There is no open area in the middle that I was able to identify. We still have not heard about epi fix 7/21; we continue to make nice progress here. I do not think the epiffix will be necessary 8/18; I thought the wound might be closed today very small open area. Apparently she went back to [facility] yesterday they told her that she did not need to be wrapped, no dressing, good go back to her ordinary shoes etc. She has some degree of chronic venous insufficiency and the wound is not closed 9/1; the patient's area is closed this is an extensive surgical wound on the right medial ankle. Electronic Signature(s) Signed: 09/23/2020 5:31:22 PM By: Linton Ham MD Entered By: Linton Ham on 09/23/2020 15:40:51 -------------------------------------------------------------------------------- Physical Exam Details Patient Name: Date of Service: Jodi Keller 09/23/2020 2:30 PM Medical Record  Number: [id number] Patient Account Number: [id number] Date of Birth/Sex: Treating RN: [birth year] (65 y.o. Jodi Keller Primary Care Provider: Cathlean Keller Other Clinician: Referring Provider: Treating Provider/Extender: Jeri Modena in  Treatment: 9 Constitutional Sitting or standing Blood Pressure is within target range for patient.. Pulse regular and within target range for patient.[name] Respirations regular, non-labored and within target range.. Temperature is normal and within the target range for the patient.[name] Appears in no distress. Notes Wound exam; medial part of her right foot and ankle. Everything is closed here surgical wound dehiscence is completely closed no evidence of surrounding infection. I think she has some degree of chronic venous insufficiency in her leg. More fibrous and adherent skin distally towards the ankle where the wounded area was Electronic Signature(s) Signed: 09/23/2020 5:31:22 PM By: Linton Ham MD Entered By: Linton Ham on 09/23/2020 15:41:58 -------------------------------------------------------------------------------- Physician Orders Details Patient Name: Date of Service: Jodi Keller, Jodi Keller 09/23/2020 2:30 PM Medical Record Number: [id number] Patient Account Number: [id number] Date of Birth/Sex: Treating RN: 1955/11/30 (65 y.o. [name], [name] Primary Care Provider: Cathlean Keller Other Clinician: Referring Provider: Treating Provider/Extender: Jeri Modena in Treatment: 9 Verbal / Phone Orders: No Diagnosis Coding ICD-10 Coding Code Description T81.31XS Disruption of external operation (surgical) wound, not elsewhere classified, sequela L97.518 Non-pressure chronic ulcer of other part of right foot with other specified severity E11.622 Type 2 diabetes mellitus with other skin ulcer Discharge From [facility] Services Discharge from [facility] - Call if any future wound care needs. Pad area x1 month for protection. Wear compression stockings daily FOR LIFE. Edema Control - Lymphedema / SCD / Other Elevate legs to the level of the heart or above for 30 minutes daily and/or when sitting, a frequency of: - throughout the day. Avoid standing for long periods  of time. Exercise regularly Moisturize legs daily. - lotion both legs every night before bed. Compression stocking or Garment 20-30 mm/Hg pressure to: - Apply in the morning and remove at night. WEAR FOR FOR LIFE. Off-Loading Other: - may wear walking shoes, no Crocs or shoes that rub the area. Electronic Signature(s) Signed: 09/23/2020 5:31:22 PM By: Linton Ham MD Signed: 09/23/2020 5:36:38 PM By: Deon Pilling Entered By: Deon Pilling on 09/23/2020 15:31:54 -------------------------------------------------------------------------------- Problem List Details Patient Name: Date of Service: Jodi Keller, Jodi Keller 09/23/2020 2:30 PM Medical Record Number: [id number] Patient Account Number: [id number] Date of Birth/Sex: Treating RN: 30-Jan-1955 (64 y.o. [name], Tammi Klippel Primary Care Provider: Cathlean Keller Other Clinician: Referring Provider: Treating Provider/Extender: Jeri Modena in Treatment: 9 Active Problems ICD-10 Encounter Code Description Active Date MDM Diagnosis T81.31XS Disruption of external operation (surgical) wound, not elsewhere classified, 07/22/2020 No Yes sequela L97.518 Non-pressure chronic ulcer of other part of right foot with other specified 07/22/2020 No Yes severity E11.622 Type 2 diabetes mellitus with other skin ulcer 07/22/2020 No Yes Inactive Problems Resolved Problems Electronic Signature(s) Signed: 09/23/2020 5:31:22 PM By: Linton Ham MD Entered By: Linton Ham on 09/23/2020 15:40:03 -------------------------------------------------------------------------------- Progress Note Details Patient Name: Date of Service: Jodi Keller. 09/23/2020 2:30 PM Medical Record Number: [id number] Patient Account Number: [id number] Date of Birth/Sex: Treating RN: 07-17-1955 (65 y.o. Jodi Keller Primary Care Provider: Cathlean Keller Other Clinician: Referring Provider: Treating Provider/Extender: Jeri Modena in  Treatment: 9 Subjective History of Present Illness (HPI) ADMISSION 07/22/2020 This is a 65 year old woman who is here accompanied by her sister-in-law. Her problem was avascular necrosis of the  right talus and for a long period of time was walking with a great deal of pain on the outside of her foot. After consulting several local doctors and podiatrist who would not consider her for surgery she was referred to orthopedics at [facility]. On 03/31/2020 she underwent an arthrodesis of the right ankle and an Achilles tenotomy. She apparently had stem cell harvest and infusion as part of this. She had surgeries on the lateral and medial part of her foot the lateral wound is healed she has been left with an open area which I think was a surgical wound dehiscence on the medial ankle. She was referred here by orthopedics at [facility] for ongoing wound care. She was put in a cam boot which she is still using in April. She also had some form of medicated wrap that she developed an acute allergic response to. The only thing we could see in looking through care everywhere was no allergy to zinc. She currently is using some form of silver impregnated Mepilex border. She has home health changing this. She had an x-ray done at [facility]. 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 Past medical history includes type 2 diabetes although the patient denies this she was told at [facility] that she was a diabetic. She is not currently on any treatment. She also has hidradenitis listed but she denies this as well stating she does not even know what this is. She has gastroesophageal reflux disease hypertension. ABI in our clinic was 1.25 on the right 07/29/2020; patient came in today complaining about the amount of pain that was caused by the debridement last time. Fortunately the surface of her wound looks a lot better and no debridement was required. We have  been using Iodoflex as a primary dressing to help with antibacterial effect and ongoing debridement. She still has the same probing tunnel to either bone, hardware or calcification. The hole is very tiny. Most of the rest of the surface of her wound looks fairly good 7/14; patient comes in with a wound a lot smaller. We have been using Iodoflex. There is no open area in the middle that I was able to identify. We still have not heard about epi fix 7/21; we continue to make nice progress here. I do not think the epiffix will be necessary 8/18; I thought the wound might be closed today very small open area. Apparently she went back to [facility] yesterday they told her that she did not need to be wrapped, no dressing, good go back to her ordinary shoes etc. She has some degree of chronic venous insufficiency and the wound is not closed 9/1; the patient's area is closed this is an extensive surgical wound on the right medial ankle. Objective Constitutional Sitting or standing Blood Pressure is within target range for patient.. Pulse regular and within target range for patient.[name] Respirations regular, non-labored and within target range.. Temperature is normal and within the target range for the patient.[name] Appears in no distress. Vitals Time Taken: 3:10 PM, Height: 67 in, Temperature: 97.7 F, Pulse: 66 bpm, Respiratory Rate: 20 breaths/min, Blood Pressure: 125/79 mmHg. General Notes: Wound exam; medial part of her right foot and ankle. Everything is closed here surgical wound dehiscence is completely closed no evidence of surrounding infection. I think she has some degree of chronic venous insufficiency in her leg. More fibrous and adherent skin distally towards the ankle where the wounded area was Integumentary (Hair, Skin)  Wound #1 status is Healed - Epithelialized. Original cause of wound was Surgical Injury. The date acquired was: 03/31/2020. The wound has been in treatment 9 weeks. The wound is located on  the Right,Medial Foot. The wound measures 0cm length x 0cm width x 0cm depth; 0cm^2 area and 0cm^3 volume. There is Fat Layer (Subcutaneous Tissue) exposed. There is no tunneling or undermining noted. There is a none present amount of drainage noted. The wound margin is flat and intact. There is no granulation within the wound bed. There is no necrotic tissue within the wound bed. Assessment Active Problems ICD-10 Disruption of external operation (surgical) wound, not elsewhere classified, sequela Non-pressure chronic ulcer of other part of right foot with other specified severity Type 2 diabetes mellitus with other skin ulcer Plan Discharge From [facility] Services: Discharge from [place] - Call if any future wound care needs. Pad area x1 month for protection. Wear compression stockings daily FOR LIFE. Edema Control - Lymphedema / SCD / Other: Elevate legs to the level of the heart or above for 30 minutes daily and/or when sitting, a frequency of: - throughout the day. Avoid standing for long periods of time. Exercise regularly Moisturize legs daily. - lotion both legs every night before bed. Compression stocking or Garment 20-30 mm/Hg pressure to: - Apply in the morning and remove at night. WEAR FOR FOR LIFE. Off-Loading: Other: - may wear walking shoes, no Crocs or shoes that rub the area. 1. The wound is closed which was a surgical wound on the right medial ankle 2. I have advised her to keep this covered with a thick Band-Aid over a foam border at least for another month 3. We have ordered her 20/30 below-knee stockings from [employer] she thinks they are at home and her mailbox even as we speak. I told her that I think there is some degree of venous insufficiency here if there is edema developing in this area is likely to reopen 4. Otherwise she can be discharged from the clinic Electronic Signature(s) Signed: 09/23/2020 5:31:22 PM By: Linton Ham MD Entered By: Linton Ham on 09/23/2020 15:43:06 -------------------------------------------------------------------------------- SuperBill Details Patient Name: Date of Service: Jodi Keller 09/23/2020 Medical Record Number: [id number] Patient Account Number: [id number] Date of Birth/Sex: Treating RN: 28-Jul-1955 (65 y.o. Jodi Keller Primary Care Provider: Cathlean Keller Other Clinician: Referring Provider: Treating Provider/Extender: Jeri Modena in Treatment: 9 Diagnosis Coding ICD-10 Codes Code Description T81.31XS Disruption of external operation (surgical) wound, not elsewhere classified, sequela L97.518 Non-pressure chronic ulcer of other part of right foot with other specified severity E11.622 Type 2 diabetes mellitus with other skin ulcer Facility Procedures CPT4 Code: [id number] Description: [id number] - WOUND CARE VISIT-LEV 3 EST PT Modifier: Quantity: 1 Physician Procedures : CPT4 Code Description Modifier [id number] - WC PHYS LEVEL 3 - EST PT ICD-10 Diagnosis Description T81.31XS Disruption of external operation (surgical) wound, not elsewhere classified, sequela L97.518 Non-pressure chronic ulcer of other part of right  foot with other specified severity E11.622 Type 2 diabetes mellitus with other skin ulcer Quantity: 1 Electronic Signature(s) Signed: 09/23/2020 5:31:22 PM By: Linton Ham MD Entered By: Linton Ham on 09/23/2020 15:43:24

## 2020-09-23 NOTE — Progress Notes (Signed)
TENASHA, EGLE ([id number]) Visit Report for 09/23/2020 Arrival Information Details Patient Name: Date of Service: Jodi Keller, Jodi Keller 09/23/2020 2:30 PM Medical Record Number: [id number] Patient Account Number: [id number] Date of Birth/Sex: Treating RN: Oct 31, 1955 (65 y.o. Jodi Keller, Jodi Keller Primary Care Jodi Keller: Cathlean Cower Other Clinician: Referring Jodi Keller: Treating Jodi Keller/Extender: Jeri Modena in Treatment: 9 Visit Information History Since Last Visit Added or deleted any medications: No Patient Arrived: Jodi Keller Any new allergies or adverse reactions: No Arrival Time: 15:13 Had a fall or experienced change in No Accompanied By: granddaughter activities of daily living that may affect Transfer Assistance: None risk of falls: Patient Identification Verified: Yes Signs or symptoms of abuse/neglect since last visito No Secondary Verification Process Completed: Yes Hospitalized since last visit: No Patient Requires Transmission-Based Precautions: No Implantable device outside of the clinic excluding No Patient Has Alerts: No cellular tissue based products placed in the center since last visit: Has Dressing in Place as Prescribed: Yes Pain Present Now: No Notes per patient compression stockings in mail to her house today. Electronic Signature(s) Signed: 09/23/2020 5:36:38 PM By: Jodi Keller Entered By: Jodi Keller on 09/23/2020 15:14:07 -------------------------------------------------------------------------------- Clinic Level of Care Assessment Details Patient Name: Date of Service: Jodi Keller, Jodi Keller 09/23/2020 2:30 PM Medical Record Number: [id number] Patient Account Number: [id number] Date of Birth/Sex: Treating RN: 1955-05-31 (65 y.o. Jodi Keller, Jodi Keller Primary Care Jodi Keller: Cathlean Cower Other Clinician: Referring Jodi Keller: Treating Jodi Keller/Extender: Jeri Modena in Treatment: 9 Clinic Level of Care Assessment Items TOOL 4  Quantity Score X- 1 0 Use when only an EandM is performed on FOLLOW-UP visit ASSESSMENTS - Nursing Assessment / Reassessment X- 1 10 Reassessment of Co-morbidities (includes updates in patient status) X- 1 5 Reassessment of Adherence to Treatment Plan ASSESSMENTS - Wound and Skin A ssessment / Reassessment X - Simple Wound Assessment / Reassessment - one wound 1 5 '[]'$  - 0 Complex Wound Assessment / Reassessment - multiple wounds X- 1 10 Dermatologic / Skin Assessment (not related to wound area) ASSESSMENTS - Focused Assessment X- 1 5 Circumferential Edema Measurements - multi extremities X- 1 10 Nutritional Assessment / Counseling / Intervention '[]'$  - 0 Lower Extremity Assessment (monofilament, tuning fork, pulses) '[]'$  - 0 Peripheral Arterial Disease Assessment (using hand held doppler) ASSESSMENTS - Ostomy and/or Continence Assessment and Care '[]'$  - 0 Incontinence Assessment and Management '[]'$  - 0 Ostomy Care Assessment and Management (repouching, etc.) PROCESS - Coordination of Care X - Simple Patient / Family Education for ongoing care 1 15 '[]'$  - 0 Complex (extensive) Patient / Family Education for ongoing care X- 1 10 Staff obtains Programmer, systems, Records, T Results / Process Orders est '[]'$  - 0 Staff telephones HHA, Nursing Homes / Clarify orders / etc '[]'$  - 0 Routine Transfer to another Facility (non-emergent condition) '[]'$  - 0 Routine Hospital Admission (non-emergent condition) '[]'$  - 0 New Admissions / Biomedical engineer / Ordering NPWT Apligraf, etc. , '[]'$  - 0 Emergency Hospital Admission (emergent condition) X- 1 10 Simple Discharge Coordination '[]'$  - 0 Complex (extensive) Discharge Coordination PROCESS - Special Needs '[]'$  - 0 Pediatric / Minor Patient Management '[]'$  - 0 Isolation Patient Management '[]'$  - 0 Hearing / Language / Visual special needs '[]'$  - 0 Assessment of Community assistance (transportation, D/C planning, etc.) '[]'$  - 0 Additional assistance / Altered  mentation '[]'$  - 0 Support Surface(s) Assessment (bed, cushion, seat, etc.) INTERVENTIONS - Wound Cleansing / Measurement X - Simple Wound Cleansing - one wound 1 5 '[]'$  -  0 Complex Wound Cleansing - multiple wounds X- 1 5 Wound Imaging (photographs - any number of wounds) '[]'$  - 0 Wound Tracing (instead of photographs) X- 1 5 Simple Wound Measurement - one wound '[]'$  - 0 Complex Wound Measurement - multiple wounds INTERVENTIONS - Wound Dressings X - Small Wound Dressing one or multiple wounds 1 10 '[]'$  - 0 Medium Wound Dressing one or multiple wounds '[]'$  - 0 Large Wound Dressing one or multiple wounds '[]'$  - 0 Application of Medications - topical '[]'$  - 0 Application of Medications - injection INTERVENTIONS - Miscellaneous '[]'$  - 0 External ear exam '[]'$  - 0 Specimen Collection (cultures, biopsies, blood, body fluids, etc.) '[]'$  - 0 Specimen(s) / Culture(s) sent or taken to Lab for analysis '[]'$  - 0 Patient Transfer (multiple staff / Civil Service fast streamer / Similar devices) '[]'$  - 0 Simple Staple / Suture removal (25 or less) '[]'$  - 0 Complex Staple / Suture removal (26 or more) '[]'$  - 0 Hypo / Hyperglycemic Management (close monitor of Blood Glucose) '[]'$  - 0 Ankle / Brachial Index (ABI) - do not check if billed separately X- 1 5 Vital Signs Has the patient been seen at the hospital within the last three years: Yes Total Score: 110 Level Of Care: New/Established - Level 3 Electronic Signature(s) Signed: 09/23/2020 5:36:38 PM By: Jodi Keller Entered By: Jodi Keller on 09/23/2020 15:32:32 -------------------------------------------------------------------------------- Encounter Discharge Information Details Patient Name: Date of Service: Jodi Friends. 09/23/2020 2:30 PM Medical Record Number: [id number] Patient Account Number: [id number] Date of Birth/Sex: Treating RN: Nov 11, 1955 (65 y.o. Jodi Keller Primary Care Kelyse Pask: Cathlean Cower Other Clinician: Referring Jodi Keller: Treating  Jodi Keller/Extender: Jeri Modena in Treatment: 9 Encounter Discharge Information Items Discharge Condition: Stable Ambulatory Status: Walker Discharge Destination: Home Transportation: Private Auto Accompanied By: granddaughter Schedule Follow-up Appointment: No Clinical Summary of Care: Electronic Signature(s) Signed: 09/23/2020 5:36:38 PM By: Jodi Keller Entered By: Jodi Keller on 09/23/2020 15:33:26 -------------------------------------------------------------------------------- Lower Extremity Assessment Details Patient Name: Date of Service: Jodi Keller, Jodi Keller 09/23/2020 2:30 PM Medical Record Number: [id number] Patient Account Number: [id number] Date of Birth/Sex: Treating RN: 04-25-1955 (65 y.o. Jodi Keller, Jodi Keller Primary Care Ayan Heffington: Cathlean Cower Other Clinician: Referring Shelisa Fern: Treating Sun Kihn/Extender: Jeri Modena in Treatment: 9 Edema Assessment Assessed: Shirlyn Goltz: No] Patrice Paradise: Yes] Edema: [Left: N] [Right: o] Calf Left: Right: Point of Measurement: From Medial Instep 36 cm Ankle Left: Right: Point of Measurement: From Medial Instep 22 cm Vascular Assessment Pulses: Dorsalis Pedis Palpable: [Right:Yes] Electronic Signature(s) Signed: 09/23/2020 5:36:38 PM By: Jodi Keller Entered By: Jodi Keller on 09/23/2020 15:16:43 -------------------------------------------------------------------------------- Multi Wound Chart Details Patient Name: Date of Service: Jodi Friends. 09/23/2020 2:30 PM Medical Record Number: [id number] Patient Account Number: [id number] Date of Birth/Sex: Treating RN: 06-Jul-1955 (65 y.o. Jodi Keller Primary Care Tanielle Emigh: Cathlean Cower Other Clinician: Referring Massimiliano Rohleder: Treating Zabella Wease/Extender: Jeri Modena in Treatment: 9 Vital Signs Height(in): 27 Pulse(bpm): 64 Weight(lbs): Blood Pressure(mmHg): 125/79 Body Mass Index(BMI): Temperature(F):  97.7 Respiratory Rate(breaths/min): 20 Photos: [N/A:N/A] Right, Medial Foot N/A N/A Wound Location: Surgical Injury N/A N/A Wounding Event: Open Surgical Wound N/A N/A Primary Etiology: Cataracts, Anemia, Hypertension, N/A N/A Comorbid History: Type II Diabetes, Osteoarthritis 03/31/2020 N/A N/A Date Acquired: 9 N/A N/A Weeks of Treatment: Healed - Epithelialized N/A N/A Wound Status: 0x0x0 N/A N/A Measurements L x W x D (cm) 0 N/A N/A A (cm) : rea 0 N/A N/A Volume (cm) : 100.00% N/A N/A % Reduction in  Area: 100.00% N/A N/A % Reduction in Volume: Full Thickness With Exposed Support N/A N/A Classification: Structures None Present N/A N/A Exudate Amount: Flat and Intact N/A N/A Wound Margin: None Present (0%) N/A N/A Granulation Amount: None Present (0%) N/A N/A Necrotic Amount: Fat Layer (Subcutaneous Tissue): Yes N/A N/A Exposed Structures: Fascia: No Tendon: No Muscle: No Joint: No Bone: No Large (67-100%) N/A N/A Epithelialization: Treatment Notes Electronic Signature(s) Signed: 09/23/2020 5:31:22 PM By: Linton Ham MD Signed: 09/23/2020 5:36:38 PM By: Jodi Keller Entered By: Linton Ham on 09/23/2020 15:40:10 -------------------------------------------------------------------------------- Pain Assessment Details Patient Name: Date of Service: Jodi Friends. 09/23/2020 2:30 PM Medical Record Number: [id number] Patient Account Number: [id number] Date of Birth/Sex: Treating RN: 06-29-1955 (65 y.o. Jodi Keller Primary Care Leaf K[employer]: Cathlean Cower Other Clinician: Referring Margaurite Salido: Treating Rolanda Campa/Extender: Jeri Modena in Treatment: 9 Active Problems Location of Pain Severity and Description of Pain Patient Has Paino No Site Locations Rate the pain. Current Pain Level: 0 Pain Management and Medication Current Pain Management: Medication: No Cold Application: No Rest: No Massage: No Activity: No T.E.N.S.:  No Heat Application: No Leg drop or elevation: No Is the Current Pain Management Adequate: Adequate How does your wound impact your activities of daily livingo Sleep: No Bathing: No Appetite: No Relationship With Others: No Bladder Continence: No Emotions: No Bowel Continence: No Work: No Toileting: No Drive: No Dressing: No Hobbies: No Electronic Signature(s) Signed: 09/23/2020 5:36:38 PM By: Jodi Keller Entered By: Jodi Keller on 09/23/2020 15:15:24 -------------------------------------------------------------------------------- Patient/Caregiver Education Details Patient Name: Date of Service: Jodi Friends 9/1/2022andnbsp2:30 PM Medical Record Number: [id number] Patient Account Number: [id number] Date of Birth/Gender: Treating RN: 1955-07-01 (65 y.o. Jodi Keller Primary Care Physician: Cathlean Cower Other Clinician: Referring Physician: Treating Physician/Extender: Jeri Modena in Treatment: 9 Education Assessment Education Provided To: Patient Education Topics Provided Wound/Skin Impairment: Handouts: Skin Care Do's and Dont's Methods: Explain/Verbal Responses: Reinforcements needed Electronic Signature(s) Signed: 09/23/2020 5:36:38 PM By: Jodi Keller Entered By: Jodi Keller on 09/23/2020 15:19:36 -------------------------------------------------------------------------------- Wound Assessment Details Patient Name: Date of Service: Jodi Keller, Jodi Keller 09/23/2020 2:30 PM Medical Record Number: [id number] Patient Account Number: [id number] Date of Birth/Sex: Treating RN: [birth year] (65 y.o. Jodi Keller, Jodi Keller Primary Care Sashay Felling: Cathlean Cower Other Clinician: Referring Euclide Granito: Treating Gearl Kimbrough/Extender: Jeri Modena in Treatment: 9 Wound Status Wound Number: 1 Primary Open Surgical Wound Etiology: Wound Location: Right, Medial Foot Wound Status: Healed - Epithelialized Wounding Event: Surgical  Injury Comorbid Cataracts, Anemia, Hypertension, Type II Diabetes, Date Acquired: 03/31/2020 History: Osteoarthritis Weeks Of Treatment: 9 Clustered Wound: No Photos Wound Measurements Length: (cm) 0 Width: (cm) 0 Depth: (cm) 0 Area: (cm) Volume: (cm) % Reduction in Area: 100% % Reduction in Volume: 100% Epithelialization: Large (67-100%) 0 Tunneling: No 0 Undermining: No Wound Description Classification: Full Thickness With Exposed Support Structures Wound Margin: Flat and Intact Exudate Amount: None Present Foul Odor After Cleansing: No Slough/Fibrino No Wound Bed Granulation Amount: None Present (0%) Exposed Structure Necrotic Amount: None Present (0%) Fascia Exposed: No Fat Layer (Subcutaneous Tissue) Exposed: Yes Tendon Exposed: No Muscle Exposed: No Joint Exposed: No Bone Exposed: No Electronic Signature(s) Signed: 09/23/2020 5:36:38 PM By: Jodi Keller Entered By: Jodi Keller on 09/23/2020 15:30:45 -------------------------------------------------------------------------------- Vitals Details Patient Name: Date of Service: Jodi Friends. 09/23/2020 2:30 PM Medical Record Number: [id number] Patient Account Number: [id number] Date of Birth/Sex: Treating RN: [birth year] (65 y.o. Jodi Keller Primary Care Mylena Sedberry: Cathlean Cower  Other Clinician: Referring Mozetta Murfin: Treating Dewarren Ledbetter/Extender: Jeri Modena in Treatment: 9 Vital Signs Time Taken: 15:10 Temperature (F): 97.7 Height (in): 67 Pulse (bpm): 66 Respiratory Rate (breaths/min): 20 Blood Pressure (mmHg): 125/79 Reference Range: 80 - 120 mg / dl Electronic Signature(s) Signed: 09/23/2020 5:36:38 PM By: Jodi Keller Entered By: Jodi Keller on 09/23/2020 15:15:01

## 2020-11-02 ENCOUNTER — Other Ambulatory Visit: Payer: Self-pay | Admitting: Obstetrics and Gynecology

## 2020-12-03 ENCOUNTER — Ambulatory Visit: Payer: Managed Care, Other (non HMO) | Admitting: Internal Medicine

## 2020-12-13 ENCOUNTER — Other Ambulatory Visit: Payer: Self-pay

## 2020-12-13 ENCOUNTER — Ambulatory Visit (INDEPENDENT_AMBULATORY_CARE_PROVIDER_SITE_OTHER): Payer: Managed Care, Other (non HMO) | Admitting: Internal Medicine

## 2020-12-13 VITALS — BP 132/82 | HR 89 | Ht 67.0 in | Wt 304.0 lb

## 2020-12-13 DIAGNOSIS — R7302 Impaired glucose tolerance (oral): Secondary | ICD-10-CM | POA: Diagnosis not present

## 2020-12-13 DIAGNOSIS — E538 Deficiency of other specified B group vitamins: Secondary | ICD-10-CM

## 2020-12-13 DIAGNOSIS — N1831 Chronic kidney disease, stage 3a: Secondary | ICD-10-CM

## 2020-12-13 DIAGNOSIS — E559 Vitamin D deficiency, unspecified: Secondary | ICD-10-CM | POA: Diagnosis not present

## 2020-12-13 DIAGNOSIS — I10 Essential (primary) hypertension: Secondary | ICD-10-CM

## 2020-12-13 DIAGNOSIS — E7800 Pure hypercholesterolemia, unspecified: Secondary | ICD-10-CM | POA: Diagnosis not present

## 2020-12-13 NOTE — Progress Notes (Signed)
Patient ID: Jodi Keller, female   DOB: 1955/02/27, 65 y.o.   MRN: [id number]        Chief Complaint: follow up HTN, HLD and hyperglycemia, ckd       HPI:  Jodi Keller is a 65 y.o. F here overall doing ok, has had extensive complicated recent hx - Lost wt with right LE surgury and 3 mo prolonged rehab, now able to walk with walker today.  Pt denies chest pain, increased sob or doe, wheezing, orthopnea, PND, increased LE swelling, palpitations, dizziness or syncope.   Pt denies polydipsia, polyuria, or new focal neuro s/s.   Pt denies fever, wt loss, night sweats, loss of appetite, or other constitutional symptoms  No other new complaint today.  Does not want to consider statin today   . 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   Generalized anxiety disorder 11/17/2014   GERD (gastroesophageal reflux disease)    Hyperlipidemia 02/25/2011   Hypertension    Morbid obesity ([place])    Uterine prolapse    Past Surgical History:  Procedure Laterality Date   BALLOON DILATION N/A 12/29/2019   Procedure: BALLOON DILATION;  Surgeon: Irene Shipper, MD;  Location: [facility];  Service: Endoscopy;  Laterality: N/A;   CESAREAN SECTION     COLONOSCOPY WITH PROPOFOL N/A 12/29/2019   Procedure: COLONOSCOPY WITH PROPOFOL;  Surgeon: Irene Shipper, MD;  Location: [facility];  Service: Endoscopy;  Laterality: N/A;   ESOPHAGOGASTRODUODENOSCOPY (EGD) WITH PROPOFOL N/A 12/29/2019   Procedure: ESOPHAGOGASTRODUODENOSCOPY (EGD) WITH PROPOFOL;  Surgeon: Irene Shipper, MD;  Location: [facility];  Service: Endoscopy;  Laterality: N/A;   EYE SURGERY     cataract surgery  per left eye    FOOT SURGERY      reports that she has never smoked. She has never used smokeless tobacco. She reports current alcohol use. She reports that she does not use drugs. family history includes Cancer in her father, sister, and sister; Diabetes in her maternal grandmother; Thyroid disease in an other family member. No Known Allergies Current Outpatient Medications on File Prior to Visit  Medication Sig Dispense Refill   Ascorbic Acid (VITAMIN C PO) Take 1 tablet by mouth at bedtime.     aspirin 81 MG chewable tablet Chew 1 tablet (81 mg total) by mouth daily. (Patient taking differently: Chew 81 mg by mouth at bedtime.) 30 tablet 0   atorvastatin (LIPITOR) 10 MG tablet TAKE 1 TABLET BY MOUTH EVERY DAY 90 tablet 1   benazepril (LOTENSIN) 20 MG tablet TAKE 1 TABLET BY MOUTH EVERY DAY 90 tablet 1   carisoprodol (SOMA) 350 MG tablet Take 1 tablet (350 mg total) by mouth 2 (two) times daily. 60 tablet 1   cholecalciferol (VITAMIN D) 1000 units tablet Take 1 tablet (1,000 Units total) by mouth daily as needed. Often forgets to take and doesn't know strength 30 tablet 3   clotrimazole-betamethasone (LOTRISONE) cream USE AS DIRECTED TWICE DAILY AS NEEDED (Patient taking differently: Apply 1 application topically daily as needed (Rash).) 15 g 3   hydrochlorothiazide (  MICROZIDE) 12.5 MG capsule Take 1 capsule (12.5 mg total) by mouth at bedtime. 90 capsule 2   nystatin cream (MYCOSTATIN) Apply 1 application topically 2 (two) times daily. 30 g 3   Omeprazole (PRILOSEC PO) Take 20 mg by mouth at bedtime.      Potassium 99 MG TABS Take 99 mg by mouth at bedtime.     azithromycin (ZITHROMAX Z-PAK) 250 MG tablet 2 tabs by mouth day 1, then 1 per day (Patient not taking: Reported on 12/13/2020) 6 tablet 1   b complex vitamins capsule Take 1 capsule by mouth at bedtime. (Patient not taking: Reported on 12/13/2020)     CALCIUM PO Take 1 tablet by mouth at bedtime. (Patient not taking: Reported on  12/13/2020)     Diclofenac Sodium (PENNSAID) 2 % SOLN Apply 1 pump twice daily. (Patient not taking: Reported on 12/13/2020) 112 g 3   Multiple Vitamins-Minerals (MULTIVITAMIN & MINERAL PO) Take 1 tablet by mouth at bedtime.      nystatin powder Apply 1 application topically 3 (three) times daily. (Patient not taking: Reported on 12/13/2020) 60 g 3   omeprazole (PRILOSEC) 20 MG capsule TAKE 1 CAPSULE BY MOUTH EVERY DAY (Patient not taking: Reported on 12/13/2020) 90 capsule 1   Sulfacetamide Sodium, Acne, 10 % LOTN Apply 1 application topically daily as needed. (Patient not taking: Reported on 12/22/2019) 118 mL 5   No current facility-administered medications on file prior to visit.        ROS:  All others reviewed and negative.  Objective        PE:  BP 132/82   Pulse 89   Ht 5\' 7"  (1.702 m)   Wt (!) 304 lb (137.9 kg)   LMP 01/12/2011   SpO2 99%   BMI 47.61 kg/m                 Constitutional: Pt appears in NAD               HENT: Head: NCAT.                Right Ear: External ear normal.                 Left Ear: External ear normal.                Eyes: . Pupils are equal, round, and reactive to light. Conjunctivae and EOM are normal               Nose: without d/c or deformity               Neck: Neck supple. Gross normal ROM               Cardiovascular: Normal rate and regular rhythm.                 Pulmonary/Chest: Effort normal and breath sounds without rales or wheezing.                Abd:  Soft, NT, ND, + BS, no organomegaly               Neurological: Pt is alert. At baseline orientation, motor grossly intact               Skin: Skin is warm. No rashes, no other new lesions, LE edema - none               Psychiatric: Pt behavior is normal without agitation  Micro: none  Cardiac tracings I have personally interpreted today:  none  Pertinent Radiological findings (summarize): none   Lab Results  Component Value Date   WBC 9.2 12/13/2020   HGB 14.1 12/13/2020    HCT 43.2 12/13/2020   PLT 346.0 12/13/2020   GLUCOSE 97 12/13/2020   CHOL 132 12/13/2020   TRIG 86.0 12/13/2020   HDL 41.70 12/13/2020   LDLCALC 73 12/13/2020   ALT 12 12/13/2020   AST 19 12/13/2020   NA 139 12/13/2020   K 4.3 12/13/2020   CL 103 12/13/2020   CREATININE 1.18 12/13/2020   BUN 21 12/13/2020   CO2 26 12/13/2020   TSH 2.96 12/13/2020   HGBA1C 6.0 12/13/2020   MICROALBUR <0.7 06/12/2019   Assessment/Plan:  Jodi Keller is a 65 y.o. White or Caucasian [1] female with  has a past medical history of Anemia, Bilateral carpal tunnel syndrome (12/16/2010), Degenerative joint disease of ankle, left (12/16/2010), Generalized anxiety disorder (11/17/2014), GERD (gastroesophageal reflux disease), Hyperlipidemia (02/25/2011), Hypertension, Morbid obesity ([place]), and Uterine prolapse.  Essential hypertension BP Readings from Last 3 Encounters:  12/13/20 132/82  12/29/19 (!) 171/94  12/29/19 118/65   Stable, pt to continue medical treatment lotensin   Hyperlipidemia Lab Results  Component Value Date   LDLCALC 73 12/13/2020   Stable, pt to continue current statin lipitor 10   Impaired glucose tolerance Lab Results  Component Value Date   HGBA1C 6.0 12/13/2020   Stable, pt to continue current medical treatment  - diet   CKD (chronic kidney disease) stage 3, GFR 30-59 ml/min (HCC) Lab Results  Component Value Date   CREATININE 1.18 12/13/2020   Stable overall, cont to avoid nephrotoxins  Followup: Return in about 6 months (around 06/12/2021).  Cathlean Cower, MD 12/14/2020 9:44 PM [facility]

## 2020-12-13 NOTE — Patient Instructions (Signed)

## 2020-12-14 ENCOUNTER — Encounter: Payer: Self-pay | Admitting: Internal Medicine

## 2020-12-14 DIAGNOSIS — N183 Chronic kidney disease, stage 3 unspecified: Secondary | ICD-10-CM | POA: Insufficient documentation

## 2020-12-14 LAB — CBC WITH DIFFERENTIAL/PLATELET
Basophils Absolute: 0.1 10*3/uL (ref 0.0–0.1)
Basophils Relative: 1 % (ref 0.0–3.0)
Eosinophils Absolute: 0.2 10*3/uL (ref 0.0–0.7)
Eosinophils Relative: 1.7 % (ref 0.0–5.0)
HCT: 43.2 % (ref 36.0–46.0)
Hemoglobin: 14.1 g/dL (ref 12.0–15.0)
Lymphocytes Relative: 16.6 % (ref 12.0–46.0)
Lymphs Abs: 1.5 10*3/uL (ref 0.7–4.0)
MCHC: 32.6 g/dL (ref 30.0–36.0)
MCV: 88.6 fl (ref 78.0–100.0)
Monocytes Absolute: 0.7 10*3/uL (ref 0.1–1.0)
Monocytes Relative: 7.8 % (ref 3.0–12.0)
Neutro Abs: 6.7 10*3/uL (ref 1.4–7.7)
Neutrophils Relative %: 72.9 % (ref 43.0–77.0)
Platelets: 346 10*3/uL (ref 150.0–400.0)
RBC: 4.88 Mil/uL (ref 3.87–5.11)
RDW: 16 % — ABNORMAL HIGH (ref 11.5–15.5)
WBC: 9.2 10*3/uL (ref 4.0–10.5)

## 2020-12-14 LAB — URINALYSIS, ROUTINE W REFLEX MICROSCOPIC
Bilirubin Urine: NEGATIVE
Nitrite: NEGATIVE
RBC / HPF: NONE SEEN (ref 0–?)
Specific Gravity, Urine: 1.025 (ref 1.000–1.030)
Total Protein, Urine: NEGATIVE
Urine Glucose: NEGATIVE
Urobilinogen, UA: 0.2 (ref 0.0–1.0)
pH: 6 (ref 5.0–8.0)

## 2020-12-14 LAB — LIPID PANEL
Cholesterol: 132 mg/dL (ref 0–200)
HDL: 41.7 mg/dL (ref >39.00)
LDL Cholesterol: 73 mg/dL (ref 0–99)
NonHDL: 90.67
Total CHOL/HDL Ratio: 3
Triglycerides: 86 mg/dL (ref 0.0–149.0)
VLDL: 17.2 mg/dL (ref 0.0–40.0)

## 2020-12-14 LAB — TSH: TSH: 2.96 u[IU]/mL (ref 0.35–5.50)

## 2020-12-14 LAB — HEPATIC FUNCTION PANEL
ALT: 12 U/L (ref 0–35)
AST: 19 U/L (ref 0–37)
Albumin: 3.8 g/dL (ref 3.5–5.2)
Alkaline Phosphatase: 83 U/L (ref 39–117)
Bilirubin, Direct: 0.2 mg/dL (ref 0.0–0.3)
Total Bilirubin: 0.8 mg/dL (ref 0.2–1.2)
Total Protein: 7.4 g/dL (ref 6.0–8.3)

## 2020-12-14 LAB — BASIC METABOLIC PANEL
BUN: 21 mg/dL (ref 6–23)
CO2: 26 mEq/L (ref 19–32)
Calcium: 9.8 mg/dL (ref 8.4–10.5)
Chloride: 103 mEq/L (ref 96–112)
Creatinine, Ser: 1.18 mg/dL (ref 0.40–1.20)
GFR: 48.49 mL/min — ABNORMAL LOW (ref >60.00)
Glucose, Bld: 97 mg/dL (ref 70–99)
Potassium: 4.3 mEq/L (ref 3.5–5.1)
Sodium: 139 mEq/L (ref 135–145)

## 2020-12-14 LAB — VITAMIN B12: Vitamin B-12: 535 pg/mL (ref 211–911)

## 2020-12-14 LAB — HEMOGLOBIN A1C: Hgb A1c MFr Bld: 6 % (ref 4.6–6.5)

## 2020-12-14 LAB — VITAMIN D 25 HYDROXY (VIT D DEFICIENCY, FRACTURES): VITD: 68.2 ng/mL (ref 30.00–100.00)

## 2020-12-14 NOTE — Assessment & Plan Note (Signed)
Lab Results  Component Value Date   LDLCALC 73 12/13/2020   Stable, pt to continue current statin lipitor 10

## 2020-12-14 NOTE — Assessment & Plan Note (Signed)
Lab Results  Component Value Date   HGBA1C 6.0 12/13/2020   Stable, pt to continue current medical treatment  - diet

## 2020-12-14 NOTE — Assessment & Plan Note (Signed)
Lab Results  Component Value Date   CREATININE 1.18 12/13/2020   Stable overall, cont to avoid nephrotoxins

## 2020-12-14 NOTE — Assessment & Plan Note (Signed)
BP Readings from Last 3 Encounters:  12/13/20 132/82  12/29/19 (!) 171/94  12/29/19 118/65   Stable, pt to continue medical treatment lotensin

## 2020-12-18 ENCOUNTER — Other Ambulatory Visit: Payer: Self-pay | Admitting: Internal Medicine

## 2020-12-18 NOTE — Telephone Encounter (Signed)
Please refill as per office routine med refill policy (all routine meds to be refilled for 3 mo or monthly (per pt preference) up to one year from last visit, then month to month grace period for 3 mo, then further med refills will have to be denied) ? ?

## 2020-12-21 DIAGNOSIS — M25532 Pain in left wrist: Secondary | ICD-10-CM | POA: Insufficient documentation

## 2020-12-22 ENCOUNTER — Ambulatory Visit: Payer: Managed Care, Other (non HMO) | Admitting: Internal Medicine

## 2020-12-23 DIAGNOSIS — M7542 Impingement syndrome of left shoulder: Secondary | ICD-10-CM | POA: Insufficient documentation

## 2021-03-04 ENCOUNTER — Ambulatory Visit: Payer: Managed Care, Other (non HMO) | Admitting: Physical Therapy

## 2021-03-16 ENCOUNTER — Other Ambulatory Visit: Payer: Self-pay

## 2021-03-16 ENCOUNTER — Ambulatory Visit: Payer: Managed Care, Other (non HMO) | Attending: Surgical | Admitting: Physical Therapy

## 2021-03-16 DIAGNOSIS — M6281 Muscle weakness (generalized): Secondary | ICD-10-CM | POA: Diagnosis present

## 2021-03-16 DIAGNOSIS — R2689 Other abnormalities of gait and mobility: Secondary | ICD-10-CM | POA: Diagnosis not present

## 2021-03-16 DIAGNOSIS — M25571 Pain in right ankle and joints of right foot: Secondary | ICD-10-CM | POA: Diagnosis present

## 2021-03-16 DIAGNOSIS — M25572 Pain in left ankle and joints of left foot: Secondary | ICD-10-CM | POA: Insufficient documentation

## 2021-03-16 NOTE — Patient Instructions (Signed)
Access Code: [email or web address] URL: [email or web address] Date: 03/16/2021 Prepared by: Hilda Blades  Exercises Active Straight Leg Raise with Quad Set - 1-2 x daily - 2 sets - 10 reps Bridge - 1-2 x daily - 2 sets - 10 reps Sidelying Hip Abduction - 1-2 x daily - 2 sets - 10 reps Seated Ankle Plantar Flexion with Resistance Loop - 1-2 x daily - 2 sets - 20 reps Seated Ankle Eversion with Resistance - 1-2 x daily - 2 sets - 20 reps

## 2021-03-16 NOTE — Therapy (Signed)
OUTPATIENT PHYSICAL THERAPY EVALUATION   Patient Name: Jodi Keller MRN: [id number] DOB:1955-06-30, 66 y.o., female Today's Date: 03/17/2021   PT End of Session - 03/17/21 0813     Visit Number 1    Number of Visits 16    Date for PT Re-Evaluation 05/11/21    Authorization Type [employer] / MCR    PT Start Time 1615    PT Stop Time 1700    PT Time Calculation (min) 45 min    Activity Tolerance Patient tolerated treatment well    Behavior During Therapy [facility] for tasks assessed/performed             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   Generalized anxiety disorder 11/17/2014   GERD (gastroesophageal reflux disease)    Hyperlipidemia 02/25/2011   Hypertension    Morbid obesity ([place])    Uterine prolapse    Past Surgical History:  Procedure Laterality Date   BALLOON DILATION N/A 12/29/2019   Procedure: BALLOON DILATION;  Surgeon: Irene Shipper, MD;  Location: [facility];  Service: Endoscopy;  Laterality: N/A;   CESAREAN SECTION     COLONOSCOPY WITH PROPOFOL N/A 12/29/2019   Procedure: COLONOSCOPY WITH PROPOFOL;  Surgeon: Irene Shipper, MD;  Location: [facility];  Service: Endoscopy;  Laterality: N/A;   ESOPHAGOGASTRODUODENOSCOPY (EGD) WITH PROPOFOL N/A 12/29/2019   Procedure: ESOPHAGOGASTRODUODENOSCOPY (EGD) WITH PROPOFOL;  Surgeon: Irene Shipper, MD;  Location: [facility];  Service: Endoscopy;  Laterality: N/A;   EYE SURGERY     cataract surgery per left eye    FOOT SURGERY     Patient Active Problem List   Diagnosis Date Noted   CKD (chronic kidney disease) stage 3, GFR 30-59 ml/min (HCC) 12/14/2020   Esophageal dysphagia    Esophageal stricture    Stress incontinence 12/12/2019   Ganglion cyst of dorsum of right wrist 09/04/2018   Acne 09/04/2018   Cellulitis 02/01/2017   Biceps muscle tear 01/31/2017   Cough 01/27/2016   Wheezing 01/27/2016   Low  back pain 01/27/2016   Left rotator cuff tear 09/17/2015   Left shoulder pain 09/03/2015   Hidradenitis 06/16/2015   Chest pain 06/16/2015   Generalized anxiety disorder 11/17/2014   Gait disorder 11/17/2014   Intertrigo 11/17/2014   Left knee pain 12/17/2013   Left otitis media 07/04/2013   Vertigo 06/26/2012   Allergic rhinitis, cause unspecified 06/26/2012   Eustachian tube dysfunction 06/26/2012   Polycythemia 12/20/2011   Peripheral edema 07/21/2011   Impaired glucose tolerance 07/21/2011   Hyperlipidemia 02/25/2011   Right leg pain 02/24/2011   Bilateral carpal tunnel syndrome 12/16/2010   Degenerative joint disease of ankle, 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   Colon cancer screening 12/10/2010   FATIGUE 10/30/2007   Morbid obesity ([place]) 11/13/2006   ANEMIA-IRON DEFICIENCY 11/13/2006   Essential hypertension 11/13/2006   GERD 11/13/2006    PCP: Biagio Borg, MD  REFERRING PROVIDER: Fayette Pho, PA  REFERRING DIAG: Primary osteoarthritis, right ankle and foot  THERAPY DIAG:  Other abnormalities of gait and mobility  Pain in right ankle and joints of right foot  Pain in left ankle and joints of left foot  Muscle weakness (generalized)  ONSET DATE: 03/31/2020 (DOS for right ankle fusion)  SUBJECTIVE:  SUBJECTIVE STATEMENT: Patient reports her balance is affected due to previous right ankle fusion and 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 Currently her  right ankle is "cool" but the left ankle is crooked and she wears a brace on it for support. She walks using a rollator and she has issues walking down incline. Patient also reports because she is walking different due to the left ankle, she will have some pain on the left side and lower back. She reports she is able to stand for 15-20 minutes if she remains moving.  PERTINENT HISTORY: Right ankle fusion 2022  PAIN:  Are you having pain? Yes NPRS scale: 0/10 (2-3/10 while walking) Pain location: Ankle Pain orientation: Bilateral  (left > right) PAIN TYPE: Chronic Pain description: Intermittent, sore Aggravating factors: Walking Relieving factors: Rest  PRECAUTIONS: Fall  WEIGHT BEARING RESTRICTIONS No  FALLS:  Has patient fallen in last 6 months? 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, sitting majority of time  PLOF: Independent  PATIENT GOALS: Improve balance to avoid falls, walk without rollator, be able to get into pool for water aerobics   OBJECTIVE:  DIAGNOSTIC FINDINGS: N/A  PATIENT SURVEYS:  FOTO 44% functional status (balance)  COGNITION: Overall cognitive status: Within functional limits for tasks assessed     SENSATION: Light touch: Patient reports bilateral ankle/foot sensation deficit  MUSCLE LENGTH: Not assessed  POSTURE: Patient with rounded shoulders, increased lumbar lordosis with forward trunk lean, knee valgus  PALPATION: Not assessed  LE AROM/PROM: Patient demonstrates gross deficits of bilateral ankle motion, not formally assessed this visit  LE MMT:  Unable to formally assess ankle strength, she does generally demonstrate gross weakness and poor control of ankle/foot  MMT Right 03/16/2021 Left 03/16/2021  Hip flexion 4 4  Hip abduction 4 4  Knee flexion 5 5  Knee extension 4+ 4+   FUNCTIONAL TESTS:  5 times sit to stand: 17 seconds (patient required to use BUE support on armrests to stand) Timed up and go (TUG): 17 seconds (patient using rollator) 2 minute walk test: 260 feet (patient using rollator)  GAIT: Distance walked: 260 Assistive device utilized: Rollator Level of assistance: Modified independence Comments: patient tends to ambulate on lateral aspect of foot, heavy reliance of rollator with forward trunk lean, trendelenburg, minimal heel strike or toe off   TODAY'S TREATMENT: SLR x 10 each Bridge (partial) x 10 Sidelying hip  abduction x 10 each Seated ankle PF with yellow x 20 each Seated ankle eversion with yellow x 20 each  PATIENT EDUCATION:  Education details: Exam findings, POC, HEP Person educated: Patient Education method: Explanation, Demonstration, Tactile cues, Verbal cues, and Handouts Education comprehension: verbalized understanding, returned demonstration, verbal cues required, tactile cues required, and needs further education  HOME EXERCISE PROGRAM: Access Code: [email or web address]   ASSESSMENT: CLINICAL IMPRESSION: Patient is a 66 y.o. female who was seen today for physical therapy evaluation and treatment for bilateral ankle pain, balance and gait impairment, and increased fall risk.    OBJECTIVE IMPAIRMENTS Abnormal gait, decreased balance, decreased endurance, difficulty walking, decreased ROM, decreased strength, impaired sensation, improper body mechanics, postural dysfunction, and pain.   ACTIVITY LIMITATIONS cleaning, community activity, meal prep, occupation, laundry, yard work, and shopping.   PERSONAL FACTORS Fitness, Past/current experiences, Time since onset of injury/illness/exacerbation, and 3+ comorbidities: BMI, previous surgical history, HTN, history of anxiety  are also affecting patient's functional outcome.    REHAB POTENTIAL: Good  CLINICAL DECISION MAKING: Evolving/moderate complexity  EVALUATION COMPLEXITY: Moderate   GOALS: Goals reviewed with patient? Yes  SHORT TERM GOALS:  STG Name Target Date Goal status  1 Patient will be I with initial HEP in order to progress with therapy. Baseline: HEP provided at evaluation 04/13/2021 INITIAL  2 PT will review FOTO with patient by 3rd visit in order to understand expected progress and outcome with therapy. Baseline: FOTO assessed at evaluation 04/13/2021 INITIAL  3 Patient will be able to perform a sit to stand without requiring BUE for assist from armrests to indicate progression with LE strength Baseline: patient requires  BUE pushing on armrest in order to stand 04/13/2021 INITIAL   LONG TERM GOALS:   LTG Name Target Date Goal status  1 Patient will be I with final HEP to maintain progress from PT. Baseline: HEP provided at evaluation 05/11/2021 INITIAL  2 Patient will report >/= 50% confidence with walking up/down a ramp via FOTO assessment in order to indicate improve walking and functional ability Baseline: 10% confidence 05/11/2021 INITIAL  3 Patient will demonstrate 5xSTS in </= 12 seconds to indicate improved strength and reduction of fall risk Baseline: 17 seconds using BUE support on armrests 05/11/2021 INITIAL  4 Patient will perform TUG in </= 13 seconds to indicate improved mobility and reduction of fall risk Baseline: 17 seconds using rollator 05/11/2021 INITIAL  5 Patient will exhibit ability to ambulate >/= 300 ft in 2MWT in order to improve community access Baseline: 260 ft using rollator 05/11/2021 INITIAL    PLAN: PT FREQUENCY: 2x/week  PT DURATION: 8 weeks  PLANNED INTERVENTIONS: Therapeutic exercises, Therapeutic activity, Neuro Muscular re-education, Balance training, Gait training, Patient/Family education, Joint mobilization, Stair training, Aquatic Therapy, Dry Needling, and Manual therapy  PLAN FOR NEXT SESSION: Review HEP and progress PRN, focus on generalized LE strengthening and balance training   Hilda Blades, PT, DPT, LAT, ATC 03/17/21  8:46 AM Phone: [phone] Fax: [phone]

## 2021-03-17 ENCOUNTER — Other Ambulatory Visit: Payer: Self-pay

## 2021-03-17 ENCOUNTER — Encounter: Payer: Self-pay | Admitting: Physical Therapy

## 2021-03-22 ENCOUNTER — Other Ambulatory Visit: Payer: Self-pay | Admitting: Internal Medicine

## 2021-03-22 NOTE — Telephone Encounter (Signed)
Please refill as per office routine med refill policy (all routine meds to be refilled for 3 mo or monthly (per pt preference) up to one year from last visit, then month to month grace period for 3 mo, then further med refills will have to be denied) ? ?

## 2021-03-23 ENCOUNTER — Ambulatory Visit: Payer: Managed Care, Other (non HMO) | Attending: Surgical

## 2021-03-23 ENCOUNTER — Other Ambulatory Visit: Payer: Self-pay

## 2021-03-23 DIAGNOSIS — M6281 Muscle weakness (generalized): Secondary | ICD-10-CM | POA: Diagnosis present

## 2021-03-23 DIAGNOSIS — M25572 Pain in left ankle and joints of left foot: Secondary | ICD-10-CM | POA: Insufficient documentation

## 2021-03-23 DIAGNOSIS — M25571 Pain in right ankle and joints of right foot: Secondary | ICD-10-CM | POA: Diagnosis present

## 2021-03-23 DIAGNOSIS — R2689 Other abnormalities of gait and mobility: Secondary | ICD-10-CM | POA: Insufficient documentation

## 2021-03-23 NOTE — Therapy (Signed)
OUTPATIENT PHYSICAL THERAPY TREATMENT NOTE   Patient Name: Jodi Keller MRN: [id number] DOB:04/19/1955, 66 y.o., female Today's Date: 03/23/2021  PCP: Biagio Borg, MD REFERRING PROVIDER:  Fayette Pho, PA   PT End of Session - 03/23/21 1619     Visit Number 2    Number of Visits 16    Date for PT Re-Evaluation 05/11/21    Authorization Type [employer] / MCR    PT Start Time 1617    PT Stop Time 1700    PT Time Calculation (min) 43 min    Activity Tolerance Patient tolerated treatment well    Behavior During Therapy [facility] for tasks assessed/performed             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   Generalized anxiety disorder 11/17/2014   GERD (gastroesophageal reflux disease)    Hyperlipidemia 02/25/2011   Hypertension    Morbid obesity ([place])    Uterine prolapse    Past Surgical History:  Procedure Laterality Date   BALLOON DILATION N/A 12/29/2019   Procedure: BALLOON DILATION;  Surgeon: Irene Shipper, MD;  Location: [facility];  Service: Endoscopy;  Laterality: N/A;   CESAREAN SECTION     COLONOSCOPY WITH PROPOFOL N/A 12/29/2019   Procedure: COLONOSCOPY WITH PROPOFOL;  Surgeon: Irene Shipper, MD;  Location: [facility];  Service: Endoscopy;  Laterality: N/A;   ESOPHAGOGASTRODUODENOSCOPY (EGD) WITH PROPOFOL N/A 12/29/2019   Procedure: ESOPHAGOGASTRODUODENOSCOPY (EGD) WITH PROPOFOL;  Surgeon: Irene Shipper, MD;  Location: [facility];  Service: Endoscopy;  Laterality: N/A;   EYE SURGERY     cataract surgery per left eye    FOOT SURGERY     Patient Active Problem List   Diagnosis Date Noted   CKD (chronic kidney disease) stage 3, GFR 30-59 ml/min (HCC) 12/14/2020   Esophageal dysphagia    Esophageal stricture    Stress incontinence 12/12/2019   Ganglion cyst of dorsum of right wrist 09/04/2018   Acne 09/04/2018   Cellulitis 02/01/2017   Biceps muscle  tear 01/31/2017   Cough 01/27/2016   Wheezing 01/27/2016   Low back pain 01/27/2016   Left rotator cuff tear 09/17/2015   Left shoulder pain 09/03/2015   Hidradenitis 06/16/2015   Chest pain 06/16/2015   Generalized anxiety disorder 11/17/2014   Gait disorder 11/17/2014   Intertrigo 11/17/2014   Left knee pain 12/17/2013   Left otitis media 07/04/2013   Vertigo 06/26/2012   Allergic rhinitis, cause unspecified 06/26/2012   Eustachian tube dysfunction 06/26/2012   Polycythemia 12/20/2011   Peripheral edema 07/21/2011   Impaired glucose tolerance 07/21/2011   Hyperlipidemia 02/25/2011   Right leg pain 02/24/2011   Bilateral carpal tunnel syndrome 12/16/2010   Degenerative joint disease of ankle, 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   Colon cancer screening 12/10/2010   FATIGUE 10/30/2007   Morbid obesity ([place]) 11/13/2006   ANEMIA-IRON DEFICIENCY 11/13/2006   Essential hypertension 11/13/2006   GERD 11/13/2006    REFERRING DIAG: Primary osteoarthritis, right ankle and foot  THERAPY DIAG:  Other abnormalities of gait and mobility  Pain in right ankle and joints of right foot  Pain in left ankle and joints of left foot  Muscle weakness (generalized)  PERTINENT HISTORY: Right ankle fusion 2022  PRECAUTIONS: Fall  ONSET DATE: 03/31/2020 (DOS for right ankle fusion)  SUBJECTIVE: It hurts the most when I get up and down.  PAIN:  Are you having pain? No (currently) NPRS scale: 0/10 (3/10 when going from sitting to standing) Pain location: Ankle Pain orientation: Bilateral (left > right) PAIN TYPE: Chronic Pain description: Intermittent, sore Aggravating factors: Walking Relieving factors: Rest    OBJECTIVE:  DIAGNOSTIC FINDINGS: N/A   PATIENT SURVEYS:  FOTO 44% functional status (balance)   COGNITION: Overall cognitive status: Within functional limits for tasks assessed                        SENSATION: Light touch: Patient reports bilateral ankle/foot sensation deficit    MUSCLE LENGTH: Not assessed   POSTURE: Patient with rounded shoulders, increased lumbar lordosis with forward trunk lean, knee valgus   PALPATION: Not assessed   LE AROM/PROM: Patient demonstrates gross deficits of bilateral ankle motion, not formally assessed this visit   LE MMT:   Unable to formally assess ankle strength, she does generally demonstrate gross weakness and poor control of ankle/foot   MMT Right 03/16/2021 Left 03/16/2021  Hip flexion 4 4  Hip abduction 4 4  Knee flexion 5 5  Knee extension 4+ 4+    FUNCTIONAL TESTS:  5 times sit to stand: 17 seconds (patient required to use BUE support on armrests to stand) Timed up and go (TUG): 17 seconds (patient using rollator) 2 minute walk test: 260 feet (patient using rollator)   GAIT: Distance walked: 260 Assistive device utilized: Rollator Level of assistance: Modified independence Comments: patient tends to ambulate on lateral aspect of foot, heavy reliance of rollator with forward trunk lean, trendelenburg, minimal heel strike or toe off     TODAY'S TREATMENT: [facility] Adult PT Treatment:                                                DATE: 03/23/2021 Therapeutic Exercise: LAQ 2 x 10 BIL Seated hamstring curl RTB x 10 BIL SLR x 10 each Bridge (partial) 2 x 10 Hooklying clamshell RTB 2 x 10 Hooklying hip adduction ball squeeze 5" hold 2 x 5 Seated ankle PF with yellow x 20 each Neuromuscular re-ed: @FM  machine Romberg stance eyes open 2 x 30" Semi tandem stance BIL x 30" Therapeutic Activity: Gait x 150' to pts car   03/16/2021: SLR x 10 each Bridge (partial) x 10 Sidelying hip abduction x 10 each Seated ankle PF with yellow x 20 each Seated ankle eversion with yellow x 20 each   PATIENT EDUCATION:  Education details: Exam findings, POC, HEP Person educated: Patient Education method: Explanation, Demonstration, Tactile cues, Verbal cues, and Handouts Education comprehension: verbalized understanding,  returned demonstration, verbal cues required, tactile cues required, and needs further education   HOME EXERCISE PROGRAM: Access Code: [email or web address]     ASSESSMENT: CLINICAL IMPRESSION: Patient presents to PT with no current pain and only mild pain in her lower extremities when moving from sitting to standing. She demonstrates good participation throughout session and is motivated to work on her strength and balance. She exhibits fear avoidance behavior with balance training due to lack of trust in herself and in her L ankle. Continue to focus sessions on bilateral lower extremity strengthening and balance training.     OBJECTIVE IMPAIRMENTS Abnormal gait, decreased balance, decreased endurance, difficulty walking, decreased ROM, decreased strength, impaired sensation, improper body mechanics, postural dysfunction, and pain.    ACTIVITY LIMITATIONS cleaning,  community activity, meal prep, occupation, Medical sales representative, yard work, and shopping.    PERSONAL FACTORS Fitness, Past/current experiences, Time since onset of injury/illness/exacerbation, and 3+ comorbidities: BMI, previous surgical history, HTN, history of anxiety  are also affecting patient's functional outcome.      REHAB POTENTIAL: Good   CLINICAL DECISION MAKING: Evolving/moderate complexity   EVALUATION COMPLEXITY: Moderate     GOALS: Goals reviewed with patient? Yes   SHORT TERM GOALS:   STG Name Target Date Goal status  1 Patient will be I with initial HEP in order to progress with therapy. Baseline: HEP provided at evaluation 04/13/2021 INITIAL  2 PT will review FOTO with patient by 3rd visit in order to understand expected progress and outcome with therapy. Baseline: FOTO assessed at evaluation 04/13/2021 INITIAL  3 Patient will be able to perform a sit to stand without requiring BUE for assist from armrests to indicate progression with LE strength Baseline: patient requires BUE pushing on armrest in order to stand 04/13/2021  INITIAL    LONG TERM GOALS:    LTG Name Target Date Goal status  1 Patient will be I with final HEP to maintain progress from PT. Baseline: HEP provided at evaluation 05/11/2021 INITIAL  2 Patient will report >/= 50% confidence with walking up/down a ramp via FOTO assessment in order to indicate improve walking and functional ability Baseline: 10% confidence 05/11/2021 INITIAL  3 Patient will demonstrate 5xSTS in </= 12 seconds to indicate improved strength and reduction of fall risk Baseline: 17 seconds using BUE support on armrests 05/11/2021 INITIAL  4 Patient will perform TUG in </= 13 seconds to indicate improved mobility and reduction of fall risk Baseline: 17 seconds using rollator 05/11/2021 INITIAL  5 Patient will exhibit ability to ambulate >/= 300 ft in 2MWT in order to improve community access Baseline: 260 ft using rollator 05/11/2021 INITIAL      PLAN: PT FREQUENCY: 2x/week   PT DURATION: 8 weeks   PLANNED INTERVENTIONS: Therapeutic exercises, Therapeutic activity, Neuro Muscular re-education, Balance training, Gait training, Patient/Family education, Joint mobilization, Stair training, Aquatic Therapy, Dry Needling, and Manual therapy   PLAN FOR NEXT SESSION:  Review HEP and progress PRN, focus on generalized LE strengthening and balance training, pt requesting high/low table next session    Evelene Croon, PTA 03/23/2021, 5:08 PM

## 2021-03-28 NOTE — Therapy (Signed)
OUTPATIENT PHYSICAL THERAPY TREATMENT NOTE   Patient Name: Jodi Keller MRN: [id number] DOB:12-Nov-1955, 66 y.o., female Today's Date: 03/29/2021  PCP: Biagio Borg, MD REFERRING PROVIDER:  Fayette Pho, [place]   PT End of Session - 03/29/21 1604     Visit Number 3    Number of Visits 16    Date for PT Re-Evaluation 05/11/21    Authorization Type [employer] / MCR    PT Start Time 1610    PT Stop Time 8185    PT Time Calculation (min) 45 min    Activity Tolerance Patient tolerated treatment well    Behavior During Therapy [facility] for tasks assessed/performed              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   Generalized anxiety disorder 11/17/2014   GERD (gastroesophageal reflux disease)    Hyperlipidemia 02/25/2011   Hypertension    Morbid obesity ([place])    Uterine prolapse    Past Surgical History:  Procedure Laterality Date   BALLOON DILATION N/A 12/29/2019   Procedure: BALLOON DILATION;  Surgeon: Irene Shipper, MD;  Location: [facility];  Service: Endoscopy;  Laterality: N/A;   CESAREAN SECTION     COLONOSCOPY WITH PROPOFOL N/A 12/29/2019   Procedure: COLONOSCOPY WITH PROPOFOL;  Surgeon: Irene Shipper, MD;  Location: [facility];  Service: Endoscopy;  Laterality: N/A;   ESOPHAGOGASTRODUODENOSCOPY (EGD) WITH PROPOFOL N/A 12/29/2019   Procedure: ESOPHAGOGASTRODUODENOSCOPY (EGD) WITH PROPOFOL;  Surgeon: Irene Shipper, MD;  Location: [facility];  Service: Endoscopy;  Laterality: N/A;   EYE SURGERY     cataract surgery per left eye    FOOT SURGERY     Patient Active Problem List   Diagnosis Date Noted   CKD (chronic kidney disease) stage 3, GFR 30-59 ml/min (HCC) 12/14/2020   Esophageal dysphagia    Esophageal stricture    Stress incontinence 12/12/2019   Ganglion cyst of dorsum of right wrist 09/04/2018   Acne 09/04/2018   Cellulitis 02/01/2017   Biceps  muscle tear 01/31/2017   Cough 01/27/2016   Wheezing 01/27/2016   Low back pain 01/27/2016   Left rotator cuff tear 09/17/2015   Left shoulder pain 09/03/2015   Hidradenitis 06/16/2015   Chest pain 06/16/2015   Generalized anxiety disorder 11/17/2014   Gait disorder 11/17/2014   Intertrigo 11/17/2014   Left knee pain 12/17/2013   Left otitis media 07/04/2013   Vertigo 06/26/2012   Allergic rhinitis, cause unspecified 06/26/2012   Eustachian tube dysfunction 06/26/2012   Polycythemia 12/20/2011   Peripheral edema 07/21/2011   Impaired glucose tolerance 07/21/2011   Hyperlipidemia 02/25/2011   Right leg pain 02/24/2011   Bilateral carpal tunnel syndrome 12/16/2010   Degenerative joint disease of ankle, 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   Colon cancer screening 12/10/2010   FATIGUE 10/30/2007   Morbid obesity ([place]) 11/13/2006   ANEMIA-IRON DEFICIENCY 11/13/2006   Essential hypertension 11/13/2006   GERD 11/13/2006    REFERRING DIAG: Primary osteoarthritis, right ankle and foot  THERAPY DIAG:  Other abnormalities of gait and mobility  Pain in right ankle and joints of right foot  Pain in left ankle and joints of left foot  Muscle weakness (generalized)  PERTINENT HISTORY: Right ankle fusion 2022  PRECAUTIONS: Fall  ONSET DATE: 03/31/2020 (DOS for right ankle fusion)  SUBJECTIVE: It hurts the most when I get up and down.  PAIN:  Are you having pain? No (currently) NPRS scale: 0/10 (3/10 when going from sitting to standing) Pain location: Ankle Pain orientation: Bilateral (left > right) PAIN TYPE: Chronic Pain description: Intermittent, sore Aggravating factors: Walking Relieving factors: Rest  PATIENT GOALS: Improve balance to avoid falls, walk without rollator, be able to get into pool for water aerobics   OBJECTIVE:  PATIENT SURVEYS:  FOTO 44% functional status (balance)   POSTURE: Patient with rounded shoulders, increased lumbar lordosis with forward trunk lean, knee  valgus   LE AROM/PROM: Patient demonstrates gross deficits of bilateral ankle motion, not formally assessed this visit   LE MMT:   Unable to formally assess ankle strength, she does generally demonstrate gross weakness and poor control of ankle/foot   MMT Right 03/16/2021 Left 03/16/2021  Hip flexion 4 4  Hip abduction 4 4  Knee flexion 5 5  Knee extension 4+ 4+    FUNCTIONAL TESTS:  5 times sit to stand: 17 seconds (patient required to use BUE support on armrests to stand) Timed up and go (TUG): 17 seconds (patient using rollator) 2 minute walk test: 260 feet (patient using rollator)   GAIT: Distance walked: 260 Assistive device utilized: Rollator Level of assistance: Modified independence Comments: patient tends to ambulate on lateral aspect of foot, heavy reliance of rollator with forward trunk lean, trendelenburg, minimal heel strike or toe off     TODAY'S TREATMENT: [facility] Adult PT Treatment:                                                DATE: 03/29/2021 Therapeutic Exercise: NuStep L5 x 5 min with UE/LE while taking subjective LAQ with 3# 2 x 15 each Seated row with blue 2 x 15 Sidelying hip abduction 2 x 10 Sit to stand with hands on thighs 2 x 10 - patient initially hesitant and required cueing for nose over toes to transition weight forward  Neuromuscular re-ed: (performed at [facility]) Romberg stance on Airex 2 x 60 sec Alternating march on Airex with fingertip support 2 x 20 Modified 1/2 tandem stance 3 x 30 sec each   [facility] Adult PT Treatment:                                                DATE: 03/23/2021 Therapeutic Exercise: LAQ 2 x 10 BIL Seated hamstring curl RTB x 10 BIL SLR x 10 each Bridge (partial) 2 x 10 Hooklying clamshell RTB 2 x 10 Hooklying hip adduction ball squeeze 5" hold 2 x 5 Seated ankle PF with yellow x 20 each Neuromuscular re-ed: @ [facility] machine Romberg stance eyes open 2 x 30" Semi tandem stance BIL x 30" Therapeutic Activity: Gait x 150' to  pts car  03/16/2021 (evaluation): SLR x 10 each Bridge (partial) x 10 Sidelying hip abduction x 10 each Seated ankle PF with yellow x 20 each Seated ankle eversion with yellow x 20 each   PATIENT EDUCATION:  Education details: HEP Person educated: Patient Education method: Consulting civil engineer, Demonstration, Corporate treasurer cues, Verbal cues Education comprehension: verbalized understanding, returned demonstration, verbal cues required, tactile cues required, and needs further education   HOME EXERCISE PROGRAM: Access Code: [email or web address]     ASSESSMENT: CLINICAL IMPRESSION: Patient tolerated therapy  well with no adverse effects. Therapy focused on progression of LE strength and balance training. Patient able to perform sit to stands with hands on thighs this visit from slightly elevated surface and progress to romberg stance on foam surface. She does continued to express fear with exercise and frequent fingertip support for balance. She was cued for posture and forward gaze with balance to avoid flexed posture. No changes to HEP this visit. Patient would benefit from continued skilled PT to progress her strength and balance in order to improve walking and maximize functional ability.    OBJECTIVE IMPAIRMENTS Abnormal gait, decreased balance, decreased endurance, difficulty walking, decreased ROM, decreased strength, impaired sensation, improper body mechanics, postural dysfunction, and pain.    ACTIVITY LIMITATIONS cleaning, community activity, meal prep, occupation, laundry, yard work, and shopping.    PERSONAL FACTORS Fitness, Past/current experiences, Time since onset of injury/illness/exacerbation, and 3+ comorbidities: BMI, previous surgical history, HTN, history of anxiety  are also affecting patient's functional outcome.      GOALS: Goals reviewed with patient? Yes   SHORT TERM GOALS:   STG Name Target Date Goal status  1 Patient will be I with initial HEP in order to progress with  therapy. Baseline: HEP provided at evaluation 04/13/2021 INITIAL  2 PT will review FOTO with patient by 3rd visit in order to understand expected progress and outcome with therapy. Baseline: FOTO assessed at evaluation 04/13/2021 INITIAL  3 Patient will be able to perform a sit to stand without requiring BUE for assist from armrests to indicate progression with LE strength Baseline: patient requires BUE pushing on armrest in order to stand 04/13/2021 INITIAL    LONG TERM GOALS:    LTG Name Target Date Goal status  1 Patient will be I with final HEP to maintain progress from PT. Baseline: HEP provided at evaluation 05/11/2021 INITIAL  2 Patient will report >/= 50% confidence with walking up/down a ramp via FOTO assessment in order to indicate improve walking and functional ability Baseline: 10% confidence 05/11/2021 INITIAL  3 Patient will demonstrate 5xSTS in </= 12 seconds to indicate improved strength and reduction of fall risk Baseline: 17 seconds using BUE support on armrests 05/11/2021 INITIAL  4 Patient will perform TUG in </= 13 seconds to indicate improved mobility and reduction of fall risk Baseline: 17 seconds using rollator 05/11/2021 INITIAL  5 Patient will exhibit ability to ambulate >/= 300 ft in 2MWT in order to improve community access Baseline: 260 ft using rollator 05/11/2021 INITIAL      PLAN: PT FREQUENCY: 2x/week   PT DURATION: 8 weeks   PLANNED INTERVENTIONS: Therapeutic exercises, Therapeutic activity, Neuro Muscular re-education, Balance training, Gait training, Patient/Family education, Joint mobilization, Stair training, Aquatic Therapy, Dry Needling, and Manual therapy   PLAN FOR NEXT SESSION:  Review HEP and progress PRN, focus on generalized LE strengthening and balance training, pt requesting high/low table next session    Hilda Blades, PT, DPT, LAT, ATC 03/29/21  5:09 PM Phone: [phone] Fax: [phone]

## 2021-03-29 ENCOUNTER — Encounter: Payer: Self-pay | Admitting: Physical Therapy

## 2021-03-29 ENCOUNTER — Other Ambulatory Visit: Payer: Self-pay

## 2021-03-29 ENCOUNTER — Ambulatory Visit: Payer: Managed Care, Other (non HMO) | Admitting: Physical Therapy

## 2021-03-29 DIAGNOSIS — R2689 Other abnormalities of gait and mobility: Secondary | ICD-10-CM | POA: Diagnosis not present

## 2021-03-29 DIAGNOSIS — M6281 Muscle weakness (generalized): Secondary | ICD-10-CM

## 2021-03-29 DIAGNOSIS — M25571 Pain in right ankle and joints of right foot: Secondary | ICD-10-CM

## 2021-03-29 DIAGNOSIS — M25572 Pain in left ankle and joints of left foot: Secondary | ICD-10-CM

## 2021-03-31 ENCOUNTER — Other Ambulatory Visit: Payer: Self-pay

## 2021-03-31 ENCOUNTER — Ambulatory Visit: Payer: Managed Care, Other (non HMO)

## 2021-03-31 DIAGNOSIS — M25571 Pain in right ankle and joints of right foot: Secondary | ICD-10-CM

## 2021-03-31 DIAGNOSIS — M25572 Pain in left ankle and joints of left foot: Secondary | ICD-10-CM

## 2021-03-31 DIAGNOSIS — R2689 Other abnormalities of gait and mobility: Secondary | ICD-10-CM | POA: Diagnosis not present

## 2021-03-31 DIAGNOSIS — M6281 Muscle weakness (generalized): Secondary | ICD-10-CM

## 2021-03-31 NOTE — Therapy (Signed)
OUTPATIENT PHYSICAL THERAPY TREATMENT NOTE   Patient Name: Jodi Keller MRN: [id number] DOB:[birth year], 66 y.o., female Today's Date: 03/31/2021  PCP: Biagio Borg, MD REFERRING PROVIDER:  Fayette Pho, [place]   PT End of Session - 03/31/21 1613     Visit Number 4    Number of Visits 16    Date for PT Re-Evaluation 05/11/21    Authorization Type [employer] / MCR    PT Start Time 1615    PT Stop Time 1950    PT Time Calculation (min) 40 min    Activity Tolerance Patient tolerated treatment well    Behavior During Therapy [facility] for tasks assessed/performed               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   Generalized anxiety disorder 11/17/2014   GERD (gastroesophageal reflux disease)    Hyperlipidemia 02/25/2011   Hypertension    Morbid obesity ([place])    Uterine prolapse    Past Surgical History:  Procedure Laterality Date   BALLOON DILATION N/A 12/29/2019   Procedure: BALLOON DILATION;  Surgeon: Irene Shipper, MD;  Location: [facility];  Service: Endoscopy;  Laterality: N/A;   CESAREAN SECTION     COLONOSCOPY WITH PROPOFOL N/A 12/29/2019   Procedure: COLONOSCOPY WITH PROPOFOL;  Surgeon: Irene Shipper, MD;  Location: [facility];  Service: Endoscopy;  Laterality: N/A;   ESOPHAGOGASTRODUODENOSCOPY (EGD) WITH PROPOFOL N/A 12/29/2019   Procedure: ESOPHAGOGASTRODUODENOSCOPY (EGD) WITH PROPOFOL;  Surgeon: Irene Shipper, MD;  Location: [facility];  Service: Endoscopy;  Laterality: N/A;   EYE SURGERY     cataract surgery per left eye    FOOT SURGERY     Patient Active Problem List   Diagnosis Date Noted   CKD (chronic kidney disease) stage 3, GFR 30-59 ml/min (HCC) 12/14/2020   Esophageal dysphagia    Esophageal stricture    Stress incontinence 12/12/2019   Ganglion cyst of dorsum of right wrist 09/04/2018   Acne 09/04/2018   Cellulitis 02/01/2017   Biceps  muscle tear 01/31/2017   Cough 01/27/2016   Wheezing 01/27/2016   Low back pain 01/27/2016   Left rotator cuff tear 09/17/2015   Left shoulder pain 09/03/2015   Hidradenitis 06/16/2015   Chest pain 06/16/2015   Generalized anxiety disorder 11/17/2014   Gait disorder 11/17/2014   Intertrigo 11/17/2014   Left knee pain 12/17/2013   Left otitis media 07/04/2013   Vertigo 06/26/2012   Allergic rhinitis, cause unspecified 06/26/2012   Eustachian tube dysfunction 06/26/2012   Polycythemia 12/20/2011   Peripheral edema 07/21/2011   Impaired glucose tolerance 07/21/2011   Hyperlipidemia 02/25/2011   Right leg pain 02/24/2011   Bilateral carpal tunnel syndrome 12/16/2010   Degenerative joint disease of ankle, 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   Colon cancer screening 12/10/2010   FATIGUE 10/30/2007   Morbid obesity ([place]) 11/13/2006   ANEMIA-IRON DEFICIENCY 11/13/2006   Essential hypertension 11/13/2006   GERD 11/13/2006    REFERRING DIAG: Primary osteoarthritis, right ankle and foot  THERAPY DIAG:  Other abnormalities of gait and mobility  Pain in right ankle and joints of right foot  Pain in left ankle and joints of left foot  Muscle weakness (generalized)  PERTINENT HISTORY: Right ankle fusion 2022  PRECAUTIONS: Fall  ONSET DATE: 03/31/2020 (DOS for right ankle fusion)  SUBJECTIVE: The only pain I'm having is the pain of finding parking.  PAIN:  Are you having pain? No (currently) NPRS scale: 0/10 (3/10 when going from sitting to standing) Pain location: Ankle Pain orientation: Bilateral (left > right) PAIN TYPE: Chronic Pain description: Intermittent, sore Aggravating factors: Walking Relieving factors: Rest  PATIENT GOALS: Improve balance to avoid falls, walk without rollator, be able to get into pool for water aerobics   OBJECTIVE:  PATIENT SURVEYS:  FOTO 44% functional status (balance)   POSTURE: Patient with rounded shoulders, increased lumbar lordosis with forward  trunk lean, knee valgus   LE AROM/PROM: Patient demonstrates gross deficits of bilateral ankle motion, not formally assessed this visit   LE MMT:   Unable to formally assess ankle strength, she does generally demonstrate gross weakness and poor control of ankle/foot   MMT Right 03/16/2021 Left 03/16/2021  Hip flexion 4 4  Hip abduction 4 4  Knee flexion 5 5  Knee extension 4+ 4+    FUNCTIONAL TESTS:  5 times sit to stand: 17 seconds (patient required to use BUE support on armrests to stand) Timed up and go (TUG): 17 seconds (patient using rollator) 2 minute walk test: 260 feet (patient using rollator)   GAIT: Distance walked: 260 Assistive device utilized: Rollator Level of assistance: Modified independence Comments: patient tends to ambulate on lateral aspect of foot, heavy reliance of rollator with forward trunk lean, trendelenburg, minimal heel strike or toe off     TODAY'S TREATMENT: [facility] Adult PT Treatment:                                                DATE: 03/31/2021 Therapeutic Exercise: NuStep L5 x 5 min with UE/LE while taking subjective LAQ with 4# 2 x 10 each Seated row with blue 2 x 15 Sit to stand with no UE support 2 x 10 - patient initially hesitant and required cueing for nose over toes to transition weight forward  SLR 2 x 10 each Bridge (partial) 2 x 10 Hooklying clamshell BTB 2 x 10 Neuromuscular re-ed: (performed at [facility]) Romberg stance on Airex 2 x 60 sec Alternating march on Airex with fingertip support 2 x 20 Modified 1/2 tandem stance 3 x 30 sec each Therapeutic Activity: Gait x 175' to pts car, cues for upright posture    [facility] Adult PT Treatment:                                                DATE: 03/29/2021 Therapeutic Exercise: NuStep L5 x 5 min with UE/LE while taking subjective LAQ with 3# 2 x 15 each Seated row with blue 2 x 15 Sidelying hip abduction 2 x 10 Sit to stand with hands on thighs 2 x 10 - patient initially hesitant and required  cueing for nose over toes to transition weight forward  Neuromuscular re-ed: (performed at [facility]) Romberg stance on Airex 2 x 60 sec Alternating march on Airex with fingertip support 2 x 20 Modified 1/2 tandem stance 3 x 30 sec each   [facility] Adult PT Treatment:  DATE: 03/23/2021 Therapeutic Exercise: LAQ 2 x 10 BIL Seated hamstring curl RTB x 10 BIL SLR x 10 each Bridge (partial) 2 x 10 Hooklying clamshell RTB 2 x 10 Hooklying hip adduction ball squeeze 5" hold 2 x 5 Seated ankle PF with yellow x 20 each Neuromuscular re-ed: @ [facility] machine Romberg stance eyes open 2 x 30" Semi tandem stance BIL x 30" Therapeutic Activity: Gait x 150' to pts car    PATIENT EDUCATION:  Education details: HEP Person educated: Patient Education method: Explanation, Demonstration, Tactile cues, Verbal cues Education comprehension: verbalized understanding, returned demonstration, verbal cues required, tactile cues required, and needs further education   HOME EXERCISE PROGRAM: Access Code: [email or web address]     ASSESSMENT: CLINICAL IMPRESSION: Patient presents to therapy with no pain and reports she has found the standing exercises helpful. Today's session focused on improving lower extremity strength, balance training, and standing activity tolerance. She continues to express fear with balance training and STS and responds well to positive reinforcement throughout session. Patient continues to benefit from skilled PT services and should be progressed as able to improve functional independence.    OBJECTIVE IMPAIRMENTS Abnormal gait, decreased balance, decreased endurance, difficulty walking, decreased ROM, decreased strength, impaired sensation, improper body mechanics, postural dysfunction, and pain.    ACTIVITY LIMITATIONS cleaning, community activity, meal prep, occupation, laundry, yard work, and shopping.    PERSONAL FACTORS Fitness, Past/current experiences, Time  since onset of injury/illness/exacerbation, and 3+ comorbidities: BMI, previous surgical history, HTN, history of anxiety  are also affecting patient's functional outcome.      GOALS: Goals reviewed with patient? Yes   SHORT TERM GOALS:   STG Name Target Date Goal status  1 Patient will be I with initial HEP in order to progress with therapy. Baseline: HEP provided at evaluation 04/13/2021 INITIAL  2 PT will review FOTO with patient by 3rd visit in order to understand expected progress and outcome with therapy. Baseline: FOTO assessed at evaluation 04/13/2021 INITIAL  3 Patient will be able to perform a sit to stand without requiring BUE for assist from armrests to indicate progression with LE strength Baseline: patient requires BUE pushing on armrest in order to stand 04/13/2021 INITIAL    LONG TERM GOALS:    LTG Name Target Date Goal status  1 Patient will be I with final HEP to maintain progress from PT. Baseline: HEP provided at evaluation 05/11/2021 INITIAL  2 Patient will report >/= 50% confidence with walking up/down a ramp via FOTO assessment in order to indicate improve walking and functional ability Baseline: 10% confidence 05/11/2021 INITIAL  3 Patient will demonstrate 5xSTS in </= 12 seconds to indicate improved strength and reduction of fall risk Baseline: 17 seconds using BUE support on armrests 05/11/2021 INITIAL  4 Patient will perform TUG in </= 13 seconds to indicate improved mobility and reduction of fall risk Baseline: 17 seconds using rollator 05/11/2021 INITIAL  5 Patient will exhibit ability to ambulate >/= 300 ft in 2MWT in order to improve community access Baseline: 260 ft using rollator 05/11/2021 INITIAL      PLAN: PT FREQUENCY: 2x/week   PT DURATION: 8 weeks   PLANNED INTERVENTIONS: Therapeutic exercises, Therapeutic activity, Neuro Muscular re-education, Balance training, Gait training, Patient/Family education, Joint mobilization, Stair training, Aquatic  Therapy, Dry Needling, and Manual therapy   PLAN FOR NEXT SESSION:  Review HEP and progress PRN, focus on generalized LE strengthening and balance training, pt requesting high/low table next session   Evelene Croon, PTA  03/31/21 5:08 PM

## 2021-04-04 ENCOUNTER — Encounter: Payer: Managed Care, Other (non HMO) | Admitting: Physical Therapy

## 2021-04-06 NOTE — Therapy (Signed)
?OUTPATIENT PHYSICAL THERAPY TREATMENT NOTE ? ? ?Patient Name: Jodi Keller ?MRN: [id number] ?DOB:1955-03-18, 66 y.o., female ?Today's Date: 04/07/2021 ? ?PCP: Biagio Borg, MD ?REFERRING PROVIDER:  Fayette Pho, PA ? ? PT End of Session - 04/07/21 1614   ? ? Visit Number 5   ? Number of Visits 16   ? Date for PT Re-Evaluation 05/11/21   ? Authorization Type [employer] / MCR   ? PT Start Time 1615   ? PT Stop Time 1655   ? PT Time Calculation (min) 40 min   ? Activity Tolerance Patient tolerated treatment well   ? Behavior During Therapy [facility] for tasks assessed/performed   ? ?  ?  ? ?  ? ? ? ? ? ?Past Medical History:  ?Diagnosis Date  ? Anemia   ? Bilateral carpal tunnel syndrome 12/16/2010  ? 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 Generalized anxiety disorder 11/17/2014  ? GERD (gastroesophageal reflux disease)   ? Hyperlipidemia 02/25/2011  ? Hypertension   ? Morbid obesity ([place])   ? Uterine prolapse   ? ?Past Surgical History:  ?Procedure Laterality Date  ? BALLOON DILATION N/A 12/29/2019  ? Procedure: BALLOON DILATION;  Surgeon: Irene Shipper, MD;  Location: [facility];  Service: Endoscopy;  Laterality: N/A;  ? CESAREAN SECTION    ? COLONOSCOPY WITH PROPOFOL N/A 12/29/2019  ? Procedure: COLONOSCOPY WITH PROPOFOL;  Surgeon: Irene Shipper, MD;  Location: [facility];  Service: Endoscopy;  Laterality: N/A;  ? ESOPHAGOGASTRODUODENOSCOPY (EGD) WITH PROPOFOL N/A 12/29/2019  ? Procedure: ESOPHAGOGASTRODUODENOSCOPY (EGD) WITH PROPOFOL;  Surgeon: Irene Shipper, MD;  Location: [facility];  Service: Endoscopy;  Laterality: N/A;  ? EYE SURGERY    ? cataract surgery per left eye   ? FOOT SURGERY    ? ?Patient Active Problem List  ? Diagnosis Date Noted  ? CKD (chronic kidney disease) stage 3, GFR 30-59 ml/min (HCC) 12/14/2020  ? Esophageal dysphagia   ? Esophageal stricture   ? Stress incontinence 12/12/2019  ? Ganglion cyst of dorsum of right wrist 09/04/2018  ? Acne 09/04/2018  ? Cellulitis 02/01/2017  ? Biceps  muscle tear 01/31/2017  ? Cough 01/27/2016  ? Wheezing 01/27/2016  ? Low back pain 01/27/2016  ? Left rotator cuff tear 09/17/2015  ? Left shoulder pain 09/03/2015  ? Hidradenitis 06/16/2015  ? Chest pain 06/16/2015  ? Generalized anxiety disorder 11/17/2014  ? Gait disorder 11/17/2014  ? Intertrigo 11/17/2014  ? Left knee pain 12/17/2013  ? Left otitis media 07/04/2013  ? Vertigo 06/26/2012  ? Allergic rhinitis, cause unspecified 06/26/2012  ? Eustachian tube dysfunction 06/26/2012  ? Polycythemia 12/20/2011  ? Peripheral edema 07/21/2011  ? Impaired glucose tolerance 07/21/2011  ? Hyperlipidemia 02/25/2011  ? Right leg pain 02/24/2011  ? Bilateral carpal tunnel syndrome 12/16/2010  ? 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 Colon cancer screening 12/10/2010  ? FATIGUE 10/30/2007  ? Morbid obesity ([place]) 11/13/2006  ? ANEMIA-IRON DEFICIENCY 11/13/2006  ? Essential hypertension 11/13/2006  ? GERD 11/13/2006  ? ? ?REFERRING DIAG: Primary osteoarthritis, right ankle and foot ? ?THERAPY DIAG:  ?Other abnormalities of gait and mobility ? ?Pain in right ankle and joints of right foot ? ?Pain in left ankle and joints of left foot ? ?Muscle weakness (generalized) ? ?PERTINENT HISTORY: Right ankle fusion 2022 ? ?PRECAUTIONS: Fall ? ?ONSET DATE: 03/31/2020 (DOS for right ankle fusion) ? ?SUBJECTIVE: Patient report she is doing better today. ? ?PAIN:  ?  Are you having pain? No (currently) ?NPRS scale: 0/10 (3/10 when going from sitting to standing) ?Pain location: Ankle ?Pain orientation: Bilateral (left > right) ?PAIN TYPE: Chronic ?Pain description: Intermittent, sore ?Aggravating factors: Walking ?Relieving factors: Rest ? ?PATIENT GOALS: Improve balance to avoid falls, walk without rollator, be able to get into pool for water aerobics ? ? ?OBJECTIVE:  ?PATIENT SURVEYS:  ?FOTO 44% functional status (balance) ?  ?POSTURE: ?Patient with rounded shoulders, increased lumbar lordosis with forward trunk lean, knee  valgus ?  ?LE AROM/PROM: ?Patient demonstrates gross deficits of bilateral ankle motion, not formally assessed this visit ?  ?LE MMT: ?  ?Unable to formally assess ankle strength, she does generally demonstrate gross weakness and poor control of ankle/foot ?  ?MMT Right ?03/16/2021 Left ?03/16/2021  ?Hip flexion 4 4  ?Hip abduction 4 4  ?Knee flexion 5 5  ?Knee extension 4+ 4+  ?  ?FUNCTIONAL TESTS:  ?5 times sit to stand: 17 seconds (patient required to use BUE support on armrests to stand) ?Timed up and go (TUG): 17 seconds (patient using rollator) ?2 minute walk test: 260 feet (patient using rollator) ?  ?GAIT: ?Distance walked: 260 ?Assistive device utilized: Rollator ?Level of assistance: Modified independence ?Comments: patient tends to ambulate on lateral aspect of foot, heavy reliance of rollator with forward trunk lean, trendelenburg, minimal heel strike or toe off ?  ?  ?TODAY'S TREATMENT: ?[facility] Adult PT Treatment:                                                DATE: 04/07/2021 ?Therapeutic Exercise: ?NuStep L5 x 5 min with UE/LE while taking subjective ?Sit to stand from table with Airex without UE support x 10 ?Sit to stand from table without UE support x 10 ?LAQ with 4# 2 x 15 each ?SLR x 10 each ?Bridge x 10 (partial) ?Sidelying hip abduction 2 x 10 ?Seated row with blue 2 x 15 ?Neuromuscular re-ed: (performed at [facility]) ?Romberg stance on Airex 3 x 60 sec - patient cued for to look certain directions  ?Alternating march on Airex with fingertip support 2 x 20 ?Modified 1/2 tandem stance 3 x 30 sec each ? ? ?[place] Adult PT Treatment:                                                DATE: 03/31/2021 ?Therapeutic Exercise: ?NuStep L5 x 5 min with UE/LE while taking subjective ?LAQ with 4# 2 x 10 each ?Seated row with blue 2 x 15 ?Sit to stand with no UE support 2 x 10 - patient initially hesitant and required cueing for nose over toes to transition weight forward  ?SLR 2 x 10 each ?Bridge (partial) 2 x 10 ?Hooklying  clamshell BTB 2 x 10 ?Neuromuscular re-ed: (performed at [facility]) ?Romberg stance on Airex 2 x 60 sec ?Alternating march on Airex with fingertip support 2 x 20 ?Modified 1/2 tandem stance 3 x 30 sec each ?Therapeutic Activity: ?Gait x 175' to pts car, cues for upright posture  ? ?[facility] Adult PT Treatment:  DATE: 03/29/2021 ?Therapeutic Exercise: ?NuStep L5 x 5 min with UE/LE while taking subjective ?LAQ with 3# 2 x 15 each ?Seated row with blue 2 x 15 ?Sidelying hip abduction 2 x 10 ?Sit to stand with hands on thighs 2 x 10 - patient initially hesitant and required cueing for nose over toes to transition weight forward  ?Neuromuscular re-ed: (performed at [facility]) ?Romberg stance on Airex 2 x 60 sec ?Alternating march on Airex with fingertip support 2 x 20 ?Modified 1/2 tandem stance 3 x 30 sec each ?  ?PATIENT EDUCATION:  ?Education details: HEP ?Person educated: Patient ?Education method: Explanation, Demonstration, Tactile cues, Verbal cues ?Education comprehension: verbalized understanding, returned demonstration, verbal cues required, tactile cues required, and needs further education ?  ?HOME EXERCISE PROGRAM: ?Access Code: [email or web address] ?  ?  ?ASSESSMENT: ?CLINICAL IMPRESSION: ?Patient tolerated therapy well with no adverse effects. Therapy continues to focus on strengthening and balance training in order to improve walking and safety. Patient able to perform sit to stands without UE support this visit indicating improvement in strength. She continues to have fear with balance training and low confidence with her walking on unlevel surfaces. No changes to HEP this visit. Patient would benefit from continued skilled PT to progress her strength and balance in order to improve walking and maximize functional ability. ? ?  ?OBJECTIVE IMPAIRMENTS Abnormal gait, decreased balance, decreased endurance, difficulty walking, decreased ROM, decreased strength, impaired sensation, improper  body mechanics, postural dysfunction, and pain.  ?  ?ACTIVITY LIMITATIONS cleaning, community activity, meal prep, occupation, laundry, yard work, and shopping.  ?  ?PERSONAL FACTORS [employer]itness, Past/current e

## 2021-04-07 ENCOUNTER — Ambulatory Visit: Payer: Managed Care, Other (non HMO) | Admitting: Physical Therapy

## 2021-04-07 ENCOUNTER — Encounter: Payer: Self-pay | Admitting: Physical Therapy

## 2021-04-07 ENCOUNTER — Other Ambulatory Visit: Payer: Self-pay

## 2021-04-07 DIAGNOSIS — R2689 Other abnormalities of gait and mobility: Secondary | ICD-10-CM | POA: Diagnosis not present

## 2021-04-11 ENCOUNTER — Other Ambulatory Visit: Payer: Self-pay

## 2021-04-11 ENCOUNTER — Encounter: Payer: Self-pay | Admitting: Physical Therapy

## 2021-04-11 ENCOUNTER — Ambulatory Visit: Payer: Managed Care, Other (non HMO) | Admitting: Physical Therapy

## 2021-04-11 DIAGNOSIS — M25572 Pain in left ankle and joints of left foot: Secondary | ICD-10-CM

## 2021-04-11 DIAGNOSIS — R2689 Other abnormalities of gait and mobility: Secondary | ICD-10-CM

## 2021-04-11 DIAGNOSIS — M25571 Pain in right ankle and joints of right foot: Secondary | ICD-10-CM

## 2021-04-11 DIAGNOSIS — M6281 Muscle weakness (generalized): Secondary | ICD-10-CM

## 2021-04-11 NOTE — Therapy (Signed)
?OUTPATIENT PHYSICAL THERAPY TREATMENT NOTE ? ? ?Patient Name: Jodi Keller ?MRN: [id number] ?DOB:02-16-1955, 66 y.o., female ?Today's Date: 04/11/2021 ? ?PCP: Biagio Borg, MD ?REFERRING PROVIDER:  Fayette Pho, PA ? ? PT End of Session - 04/11/21 1624   ? ? Visit Number 6   ? Number of Visits 16   ? Date for PT Re-Evaluation 05/11/21   ? Authorization Type CIG[employer] / MCR   ? PT Start Time 1615   ? PT Stop Time 1700   ? PT Time Calculation (min) 45 min   ? Activity Tolerance Patient tolerated treatment well   ? Behavior During Therapy [facility] for tasks assessed/performed   ? ?  ?  ? ?  ? ? ? ? ? ? ?Past Medical History:  ?Diagnosis Date  ? Anemia   ? Bilateral carpal tunnel syndrome 12/16/2010  ? 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 Generalized anxiety disorder 11/17/2014  ? GERD (gastroesophageal reflux disease)   ? Hyperlipidemia 02/25/2011  ? Hypertension   ? Morbid obesity ([place])   ? Uterine prolapse   ? ?Past Surgical History:  ?Procedure Laterality Date  ? BALLOON DILATION N/A 12/29/2019  ? Procedure: BALLOON DILATION;  Surgeon: Irene Shipper, MD;  Location: [facility];  Service: Endoscopy;  Laterality: N/A;  ? CESAREAN SECTION    ? COLONOSCOPY WITH PROPOFOL N/A 12/29/2019  ? Procedure: COLONOSCOPY WITH PROPOFOL;  Surgeon: Irene Shipper, MD;  Location: [facility];  Service: Endoscopy;  Laterality: N/A;  ? ESOPHAGOGASTRODUODENOSCOPY (EGD) WITH PROPOFOL N/A 12/29/2019  ? Procedure: ESOPHAGOGASTRODUODENOSCOPY (EGD) WITH PROPOFOL;  Surgeon: Irene Shipper, MD;  Location: [facility];  Service: Endoscopy;  Laterality: N/A;  ? EYE SURGERY    ? cataract surgery per left eye   ? FOOT SURGERY    ? ?Patient Active Problem List  ? Diagnosis Date Noted  ? CKD (chronic kidney disease) stage 3, GFR 30-59 ml/min (HCC) 12/14/2020  ? Esophageal dysphagia   ? Esophageal stricture   ? Stress incontinence 12/12/2019  ? Ganglion cyst of dorsum of right wrist 09/04/2018  ? Acne 09/04/2018  ? Cellulitis 02/01/2017  ?  Biceps muscle tear 01/31/2017  ? Cough 01/27/2016  ? Wheezing 01/27/2016  ? Low back pain 01/27/2016  ? Left rotator cuff tear 09/17/2015  ? Left shoulder pain 09/03/2015  ? Hidradenitis 06/16/2015  ? Chest pain 06/16/2015  ? Generalized anxiety disorder 11/17/2014  ? Gait disorder 11/17/2014  ? Intertrigo 11/17/2014  ? Left knee pain 12/17/2013  ? Left otitis media 07/04/2013  ? Vertigo 06/26/2012  ? Allergic rhinitis, cause unspecified 06/26/2012  ? Eustachian tube dysfunction 06/26/2012  ? Polycythemia 12/20/2011  ? Peripheral edema 07/21/2011  ? Impaired glucose tolerance 07/21/2011  ? Hyperlipidemia 02/25/2011  ? Right leg pain 02/24/2011  ? Bilateral carpal tunnel syndrome 12/16/2010  ? 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 Colon cancer screening 12/10/2010  ? FATIGUE 10/30/2007  ? Morbid obesity ([place]) 11/13/2006  ? ANEMIA-IRON DEFICIENCY 11/13/2006  ? Essential hypertension 11/13/2006  ? GERD 11/13/2006  ? ? ?REFERRING DIAG: Primary osteoarthritis, right ankle and foot ? ?THERAPY DIAG:  ?Other abnormalities of gait and mobility ? ?Pain in right ankle and joints of right foot ? ?Pain in left ankle and joints of left foot ? ?Muscle weakness (generalized) ? ?PERTINENT HISTORY: Right ankle fusion 2022 ? ?PRECAUTIONS: Fall ? ?ONSET DATE: 03/31/2020 (DOS for right ankle fusion) ? ?SUBJECTIVE: Patient report she is doing well. She notes that  her feet are feeling numb today. ? ?PAIN:  ?Are you having pain? No (currently) ?NPRS scale: 0/10 (3/10 when going from sitting to standing) ?Pain location: Ankle ?Pain orientation: Bilateral (left > right) ?PAIN TYPE: Chronic ?Pain description: Intermittent, sore ?Aggravating factors: Walking ?Relieving factors: Rest ? ?PATIENT GOALS: Improve balance to avoid falls, walk without rollator, be able to get into pool for water aerobics ? ? ?OBJECTIVE:  ?PATIENT SURVEYS:  ?FOTO: 44% functional status - 04/11/2021; 44% at intake (balance) ?  ?POSTURE: ?Patient with  rounded shoulders, increased lumbar lordosis with forward trunk lean, knee valgus ?  ?LE AROM/PROM: ?Patient demonstrates gross deficits of bilateral ankle motion, not formally assessed this visit ?  ?LE MMT: ?  ?Unable to formally assess ankle strength, she does generally demonstrate gross weakness and poor control of ankle/foot ?  ?MMT Right ?03/16/2021 Left ?03/16/2021  ?Hip flexion 4 4  ?Hip abduction 4 4  ?Knee flexion 5 5  ?Knee extension 4+ 4+  ?  ?FUNCTIONAL TESTS:  ?5 times sit to stand: 16 seconds with hands on thighs - 04/11/2021 (17 seconds at intake and patient required to use BUE support on armrests to stand) ?Timed up and go (TUG): 17 seconds (patient using rollator) ?2 minute walk test: 260 feet (patient using rollator) ?  ?GAIT: ?Distance walked: 260 ?Assistive device utilized: Rollator ?Level of assistance: Modified independence ?Comments: patient tends to ambulate on lateral aspect of foot, heavy reliance of rollator with forward trunk lean, trendelenburg, minimal heel strike or toe off ?  ?  ?TODAY'S TREATMENT: ?[facility] Adult PT Treatment:                                                DATE: 04/11/2021 ?Therapeutic Exercise: ?NuStep L5 x 5 min with UE/LE while taking subjective ?SLR 2 x 10 each ?Bridge 2 x 10 (partial) ?Sidelying hip abduction 2 x 10 ?Sit to stand from chair with hands on thighs 2 x 10 ?Neuromuscular re-ed: (performed at [facility]) ?Romberg stance on Airex 3 x 60 sec - patient cued for to look certain directions  ?Alternating march on Airex with fingertip support 2 x 20 ?Modified 1/2 tandem stance 3 x 30 sec each ?Gait training: ?Gait training using SPC, cane lowered for proper fit, instruction on proper positioning for cane and coordinating cane movement with opposite leg, proper heel toe progression and step through pattern ? ? ?[facility] Adult PT Treatment:                                                DATE: 04/07/2021 ?Therapeutic Exercise: ?NuStep L5 x 5 min with UE/LE while taking  subjective ?Sit to stand from table with Airex without UE support x 10 ?Sit to stand from table without UE support x 10 ?LAQ with 4# 2 x 15 each ?SLR x 10 each ?Bridge x 10 (partial) ?Sidelying hip abduction 2 x 10 ?Seated row with blue 2 x 15 ?Neuromuscular re-ed: (performed at [facility]) ?Romberg stance on Airex 3 x 60 sec - patient cued for to look certain directions  ?Alternating march on Airex with fingertip support 2 x 20 ?Modified 1/2 tandem stance 3 x 30 sec each ? ?[facility] Adult PT Treatment:  DATE: 03/31/2021 ?Therapeutic Exercise: ?NuStep L5 x 5 min with UE/LE while taking subjective ?LAQ with 4# 2 x 10 each ?Seated row with blue 2 x 15 ?Sit to stand with no UE support 2 x 10 - patient initially hesitant and required cueing for nose over toes to transition weight forward  ?SLR 2 x 10 each ?Bridge (partial) 2 x 10 ?Hooklying clamshell BTB 2 x 10 ?Neuromuscular re-ed: (performed at [facility]) ?Romberg stance on Airex 2 x 60 sec ?Alternating march on Airex with fingertip support 2 x 20 ?Modified 1/2 tandem stance 3 x 30 sec each ?Therapeutic Activity: ?Gait x 175' to pts car, cues for upright posture  ?  ?PATIENT EDUCATION:  ?Education details: HEP, FOTO ?Person educated: Patient ?Education method: Explanation, Demonstration, Tactile cues, Verbal cues ?Education comprehension: verbalized understanding, returned demonstration, verbal cues required, tactile cues required, and needs further education ?  ?HOME EXERCISE PROGRAM: ?Access Code: [email or web address] ?  ?  ?ASSESSMENT: ?CLINICAL IMPRESSION: ?Patient tolerated therapy well with no adverse effects. She does not report improvement in her FOTO score this visit, but does report she feels more confident with her walking especially on uneven ground. Therapy continues to focus on progressing strength, balance, and walking ability. Utilized [facility] this visit for ambulation, patient required cueing for proper technique. She continues to be fearful  with balance tasks. She did demonstrate improvement in her 5xSTS without needing to use her hands on the armrests. Patient would benefit from continued skilled PT to progress her strength and balance in order to improve

## 2021-04-12 NOTE — Therapy (Signed)
?OUTPATIENT PHYSICAL THERAPY TREATMENT NOTE ? ? ?Patient Name: Jodi Keller ?MRN: [id number] ?DOB:[birth year], 66 y.o., female ?Today's Date: 04/14/2021 ? ?PCP: Biagio Borg, MD ?REFERRING PROVIDER:  Fayette Pho, PA ? ? PT End of Session - 04/14/21 1629   ? ? Visit Number 7   ? Number of Visits 16   ? Date for PT Re-Evaluation 05/11/21   ? Authorization Type [employer] / MCR   ? PT Start Time 1615   ? PT Stop Time 1655   ? PT Time Calculation (min) 40 min   ? Activity Tolerance Patient tolerated treatment well   ? Behavior During Therapy [facility] for tasks assessed/performed   ? ?  ?  ? ?  ? ? ? ? ? ? ? ?Past Medical History:  ?Diagnosis Date  ? Anemia   ? Bilateral carpal tunnel syndrome 12/16/2010  ? 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 Generalized anxiety disorder 11/17/2014  ? GERD (gastroesophageal reflux disease)   ? Hyperlipidemia 02/25/2011  ? Hypertension   ? Morbid obesity ([place])   ? Uterine prolapse   ? ?Past Surgical History:  ?Procedure Laterality Date  ? BALLOON DILATION N/A 12/29/2019  ? Procedure: BALLOON DILATION;  Surgeon: Irene Shipper, MD;  Location: [facility];  Service: Endoscopy;  Laterality: N/A;  ? CESAREAN SECTION    ? COLONOSCOPY WITH PROPOFOL N/A 12/29/2019  ? Procedure: COLONOSCOPY WITH PROPOFOL;  Surgeon: Irene Shipper, MD;  Location: [facility];  Service: Endoscopy;  Laterality: N/A;  ? ESOPHAGOGASTRODUODENOSCOPY (EGD) WITH PROPOFOL N/A 12/29/2019  ? Procedure: ESOPHAGOGASTRODUODENOSCOPY (EGD) WITH PROPOFOL;  Surgeon: Irene Shipper, MD;  Location: [facility];  Service: Endoscopy;  Laterality: N/A;  ? EYE SURGERY    ? cataract surgery per left eye   ? FOOT SURGERY    ? ?Patient Active Problem List  ? Diagnosis Date Noted  ? CKD (chronic kidney disease) stage 3, GFR 30-59 ml/min (HCC) 12/14/2020  ? Esophageal dysphagia   ? Esophageal stricture   ? Stress incontinence 12/12/2019  ? Ganglion cyst of dorsum of right wrist 09/04/2018  ? Acne 09/04/2018  ? Cellulitis 02/01/2017  ?  Biceps muscle tear 01/31/2017  ? Cough 01/27/2016  ? Wheezing 01/27/2016  ? Low back pain 01/27/2016  ? Left rotator cuff tear 09/17/2015  ? Left shoulder pain 09/03/2015  ? Hidradenitis 06/16/2015  ? Chest pain 06/16/2015  ? Generalized anxiety disorder 11/17/2014  ? Gait disorder 11/17/2014  ? Intertrigo 11/17/2014  ? Left knee pain 12/17/2013  ? Left otitis media 07/04/2013  ? Vertigo 06/26/2012  ? Allergic rhinitis, cause unspecified 06/26/2012  ? Eustachian tube dysfunction 06/26/2012  ? Polycythemia 12/20/2011  ? Peripheral edema 07/21/2011  ? Impaired glucose tolerance 07/21/2011  ? Hyperlipidemia 02/25/2011  ? Right leg pain 02/24/2011  ? Bilateral carpal tunnel syndrome 12/16/2010  ? 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 Colon cancer screening 12/10/2010  ? FATIGUE 10/30/2007  ? Morbid obesity ([place]) 11/13/2006  ? ANEMIA-IRON DEFICIENCY 11/13/2006  ? Essential hypertension 11/13/2006  ? GERD 11/13/2006  ? ? ?REFERRING DIAG: Primary osteoarthritis, right ankle and foot ? ?THERAPY DIAG:  ?Other abnormalities of gait and mobility ? ?Pain in right ankle and joints of right foot ? ?Pain in left ankle and joints of left foot ? ?Muscle weakness (generalized) ? ?PERTINENT HISTORY: Right ankle fusion 2022 ? ?PRECAUTIONS: Fall ? ?ONSET DATE: 03/31/2020 (DOS for right ankle fusion) ? ?SUBJECTIVE: Patient report she is doing well. She notes  that her feet are feeling numb today. ? ?PAIN:  ?Are you having pain? No (currently) ?NPRS scale: 0/10 (3/10 when going from sitting to standing) ?Pain location: Ankle ?Pain orientation: Bilateral (left > right) ?PAIN TYPE: Chronic ?Pain description: Intermittent, sore ?Aggravating factors: Walking ?Relieving factors: Rest ? ?PATIENT GOALS: Improve balance to avoid falls, walk without rollator, be able to get into pool for water aerobics ? ? ?OBJECTIVE:  ?PATIENT SURVEYS:  ?FOTO: 44% functional status - 04/11/2021; 44% at intake (balance) ?  ?POSTURE: ?Patient with  rounded shoulders, increased lumbar lordosis with forward trunk lean, knee valgus ?  ?LE AROM/PROM: ?Patient demonstrates gross deficits of bilateral ankle motion, not formally assessed this visit ?  ?LE MMT: ?  ?Unable to formally assess ankle strength, she does generally demonstrate gross weakness and poor control of ankle/foot ?  ?MMT Right ?03/16/2021 Left ?03/16/2021 Rt / Lt ?04/14/2021  ?Hip flexion '4 4 4 '$ / 4  ?Hip abduction '4 4 4 '$ / 4  ?Knee flexion 5 5   ?Knee extension 4+ 4+   ?  ?FUNCTIONAL TESTS:  ?5 times sit to stand: 16 seconds with hands on thighs - 04/11/2021 (17 seconds at intake and patient required to use BUE support on armrests to stand) ?Timed up and go (TUG): 17 seconds (patient using rollator) ?2 minute walk test: 300 feet (patient using rollator) - 04/14/2021 ?  ?GAIT: - reassessed 04/14/2021 ?Distance walked: 300 ft ?Assistive device utilized: Rollator ?Level of assistance: Modified independence ?Comments: patient tends to ambulate on lateral aspect of foot, heavy reliance of rollator with forward trunk lean, trendelenburg, minimal heel strike or toe off ?  ?  ?TODAY'S TREATMENT: ?[facility] Adult PT Treatment:                                                DATE: 04/14/2021 ?Therapeutic Exercise: ?NuStep L7 x 6 min with UE/LE while taking subjective ?SLR 2 x 10 each ?Bridge 2 x 10 (partial) ?Hooklying clamshell with black 2 x 15 ?Sidelying hip abduction 2 x 15 ?Sit to stand from chair with hands on thighs 2 x 10 ?2MWT for workload capacity training - 300 ft using RW ?Neuromuscular re-ed: (performed at [facility]) ?Romberg stance on Airex 3 x 60 sec - patient cued for to look certain directions  ?Alternating march on Airex with fingertip support 2 x 20 ?Modified 1/2 tandem stance 2 x 60 sec each ? ? ?[facility] Adult PT Treatment:                                                DATE: 04/11/2021 ?Therapeutic Exercise: ?NuStep L5 x 5 min with UE/LE while taking subjective ?SLR 2 x 10 each ?Bridge 2 x 10 (partial) ?Sidelying  hip abduction 2 x 10 ?Sit to stand from chair with hands on thighs 2 x 10 ?Neuromuscular re-ed: (performed at [facility]) ?Romberg stance on Airex 3 x 60 sec - patient cued for to look certain directions  ?Alternating march on Airex with fingertip support 2 x 20 ?Modified 1/2 tandem stance 3 x 30 sec each ?Gait training: ?Gait training using SPC, cane lowered for proper fit, instruction on proper positioning for cane and coordinating cane movement with opposite leg, proper heel  toe progression and step through pattern ? ?[facility] Adult PT Treatment:                                                DATE: 04/07/2021 ?Therapeutic Exercise: ?NuStep L5 x 5 min with UE/LE while taking subjective ?Sit to stand from table with Airex without UE support x 10 ?Sit to stand from table without UE support x 10 ?LAQ with 4# 2 x 15 each ?SLR x 10 each ?Bridge x 10 (partial) ?Sidelying hip abduction 2 x 10 ?Seated row with blue 2 x 15 ?Neuromuscular re-ed: (performed at [facility]) ?Romberg stance on Airex 3 x 60 sec - patient cued for to look certain directions  ?Alternating march on Airex with fingertip support 2 x 20 ?Modified 1/2 tandem stance 3 x 30 sec each ?  ?PATIENT EDUCATION:  ?Education details: HEP ?Person educated: Patient ?Education method: Explanation, Demonstration, Tactile cues, Verbal cues ?Education comprehension: verbalized understanding, returned demonstration, verbal cues required, tactile cues required, and needs further education ?  ?HOME EXERCISE PROGRAM: ?Access Code: [email or web address] ?  ?  ?ASSESSMENT: ?CLINICAL IMPRESSION: ?Patient tolerated therapy well with no adverse effects. She demonstrates improvement in her 2MWT this visit and seems to progressing well with her strengthening and balance training. She does continue to report high fear with balance training and walking on unstable or unlevel surfaces. No changes to HEP this visit. Patient would benefit from continued skilled PT to progress her strength and balance in order to  improve walking and maximize functional ability. ? ?  ?OBJECTIVE IMPAIRMENTS Abnormal gait, decreased balance, decreased endurance, difficulty walking, decreased ROM, decreased strength, impaired sensation, im

## 2021-04-14 ENCOUNTER — Encounter: Payer: Self-pay | Admitting: Physical Therapy

## 2021-04-14 ENCOUNTER — Ambulatory Visit: Payer: Managed Care, Other (non HMO) | Admitting: Physical Therapy

## 2021-04-14 ENCOUNTER — Other Ambulatory Visit: Payer: Self-pay

## 2021-04-14 DIAGNOSIS — M25572 Pain in left ankle and joints of left foot: Secondary | ICD-10-CM

## 2021-04-14 DIAGNOSIS — R2689 Other abnormalities of gait and mobility: Secondary | ICD-10-CM

## 2021-04-14 DIAGNOSIS — M6281 Muscle weakness (generalized): Secondary | ICD-10-CM

## 2021-04-14 DIAGNOSIS — M25571 Pain in right ankle and joints of right foot: Secondary | ICD-10-CM

## 2021-04-20 ENCOUNTER — Ambulatory Visit: Payer: Managed Care, Other (non HMO)

## 2021-04-20 DIAGNOSIS — M6281 Muscle weakness (generalized): Secondary | ICD-10-CM

## 2021-04-20 DIAGNOSIS — M25571 Pain in right ankle and joints of right foot: Secondary | ICD-10-CM

## 2021-04-20 DIAGNOSIS — R2689 Other abnormalities of gait and mobility: Secondary | ICD-10-CM

## 2021-04-20 DIAGNOSIS — M25572 Pain in left ankle and joints of left foot: Secondary | ICD-10-CM

## 2021-04-20 NOTE — Therapy (Signed)
?OUTPATIENT PHYSICAL THERAPY TREATMENT NOTE ? ? ?Patient Name: Jodi Keller ?MRN: [id number] ?DOB:1955/09/12, 66 y.o., female ?Today's Date: 04/20/2021 ? ?PCP: Biagio Borg, MD ?REFERRING PROVIDER:  Fayette Pho, PA ? ? PT End of Session - 04/20/21 1832   ? ? Visit Number 8   ? Number of Visits 16   ? Date for PT Re-Evaluation 05/11/21   ? Authorization Type [employer] / MCR   ? PT Start Time 7619   ? PT Stop Time 1910   ? PT Time Calculation (min) 40 min   ? Activity Tolerance Patient tolerated treatment well   ? Behavior During Therapy [facility] for tasks assessed/performed   ? ?  ?  ? ?  ? ? ? ? ? ? ? ? ?Past Medical History:  ?Diagnosis Date  ? Anemia   ? Bilateral carpal tunnel syndrome 12/16/2010  ? 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 Generalized anxiety disorder 11/17/2014  ? GERD (gastroesophageal reflux disease)   ? Hyperlipidemia 02/25/2011  ? Hypertension   ? Morbid obesity ([place])   ? Uterine prolapse   ? ?Past Surgical History:  ?Procedure Laterality Date  ? BALLOON DILATION N/A 12/29/2019  ? Procedure: BALLOON DILATION;  Surgeon: Irene Shipper, MD;  Location: [facility];  Service: Endoscopy;  Laterality: N/A;  ? CESAREAN SECTION    ? COLONOSCOPY WITH PROPOFOL N/A 12/29/2019  ? Procedure: COLONOSCOPY WITH PROPOFOL;  Surgeon: Irene Shipper, MD;  Location: [facility];  Service: Endoscopy;  Laterality: N/A;  ? ESOPHAGOGASTRODUODENOSCOPY (EGD) WITH PROPOFOL N/A 12/29/2019  ? Procedure: ESOPHAGOGASTRODUODENOSCOPY (EGD) WITH PROPOFOL;  Surgeon: Irene Shipper, MD;  Location: [facility];  Service: Endoscopy;  Laterality: N/A;  ? EYE SURGERY    ? cataract surgery per left eye   ? FOOT SURGERY    ? ?Patient Active Problem List  ? Diagnosis Date Noted  ? CKD (chronic kidney disease) stage 3, GFR 30-59 ml/min (HCC) 12/14/2020  ? Esophageal dysphagia   ? Esophageal stricture   ? Stress incontinence 12/12/2019  ? Ganglion cyst of dorsum of right wrist 09/04/2018  ? Acne 09/04/2018  ? Cellulitis 02/01/2017  ?  Biceps muscle tear 01/31/2017  ? Cough 01/27/2016  ? Wheezing 01/27/2016  ? Low back pain 01/27/2016  ? Left rotator cuff tear 09/17/2015  ? Left shoulder pain 09/03/2015  ? Hidradenitis 06/16/2015  ? Chest pain 06/16/2015  ? Generalized anxiety disorder 11/17/2014  ? Gait disorder 11/17/2014  ? Intertrigo 11/17/2014  ? Left knee pain 12/17/2013  ? Left otitis media 07/04/2013  ? Vertigo 06/26/2012  ? Allergic rhinitis, cause unspecified 06/26/2012  ? Eustachian tube dysfunction 06/26/2012  ? Polycythemia 12/20/2011  ? Peripheral edema 07/21/2011  ? Impaired glucose tolerance 07/21/2011  ? Hyperlipidemia 02/25/2011  ? Right leg pain 02/24/2011  ? Bilateral carpal tunnel syndrome 12/16/2010  ? 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 Colon cancer screening 12/10/2010  ? FATIGUE 10/30/2007  ? Morbid obesity ([place]) 11/13/2006  ? ANEMIA-IRON DEFICIENCY 11/13/2006  ? Essential hypertension 11/13/2006  ? GERD 11/13/2006  ? ? ?REFERRING DIAG: Primary osteoarthritis, right ankle and foot ? ?THERAPY DIAG:  ?Other abnormalities of gait and mobility ? ?Pain in right ankle and joints of right foot ? ?Pain in left ankle and joints of left foot ? ?Muscle weakness (generalized) ? ?PERTINENT HISTORY: Right ankle fusion 2022 ? ?PRECAUTIONS: Fall ? ?ONSET DATE: 03/31/2020 (DOS for right ankle fusion) ? ?SUBJECTIVE:  ?Patient report she is doing well.  She notes that her feet are feeling numb today. ? ?PAIN:  ?Are you having pain? No (currently) ?NPRS scale: 0/10 (3/10 when going from sitting to standing) ?Pain location: Ankle ?Pain orientation: Bilateral (left > right) ?PAIN TYPE: Chronic ?Pain description: Intermittent, sore ?Aggravating factors: Walking ?Relieving factors: Rest ? ?PATIENT GOALS: Improve balance to avoid falls, walk without rollator, be able to get into pool for water aerobics ? ? ?OBJECTIVE:  ?PATIENT SURVEYS:  ?FOTO: 44% functional status - 04/11/2021; 44% at intake (balance) ?  ?POSTURE: ?Patient with  rounded shoulders, increased lumbar lordosis with forward trunk lean, knee valgus ?  ?LE AROM/PROM: ?Patient demonstrates gross deficits of bilateral ankle motion, not formally assessed this visit ?  ?LE MMT: ?  ?Unable to formally assess ankle strength, she does generally demonstrate gross weakness and poor control of ankle/foot ?  ?MMT Right ?03/16/2021 Left ?03/16/2021 Rt / Lt ?04/14/2021  ?Hip flexion '4 4 4 '$ / 4  ?Hip abduction '4 4 4 '$ / 4  ?Knee flexion 5 5   ?Knee extension 4+ 4+   ?  ?FUNCTIONAL TESTS:  ?5 times sit to stand: 16 seconds with hands on thighs - 04/11/2021 (17 seconds at intake and patient required to use BUE support on armrests to stand) ?Timed up and go (TUG): 17 seconds (patient using rollator) ?2 minute walk test: 300 feet (patient using rollator) - 04/14/2021 ?  ?GAIT: - reassessed 04/14/2021 ?Distance walked: 300 ft ?Assistive device utilized: Rollator ?Level of assistance: Modified independence ?Comments: patient tends to ambulate on lateral aspect of foot, heavy reliance of rollator with forward trunk lean, trendelenburg, minimal heel strike or toe off ?  ?  ?TODAY'S TREATMENT: ?[facility] Adult PT Treatment:                                                DATE: 04/20/2021 ?Therapeutic Exercise: ?NuStep L7 x 5 min with UE/LE while taking subjective ?LAQ 3# 2x10 BIL ?SLR 2 x 10 each ?Seated hamstring curl BlueTB 2x10 BIL ?Bridge 2 x 10 (partial) ?Hooklying clamshell with black 2 x 15 ?Sit to stand from chair with hands on thighs 2 x 10 ?Neuromuscular re-ed: (performed at [facility]) ?Romberg stance on Airex 3 x 60 sec - patient cued for to look certain directions  ?Alternating march on Airex with fingertip support 2 x 20 ?Modified 1/2 tandem stance 2 x 60 sec each ?Gait training: ?Gait training [id number]' using quad cane, instruction on proper positioning for cane and coordinating cane movement with opposite leg, proper heel toe progression and step through pattern ? ? ? ?[facility] Adult PT Treatment:                                                 DATE: 04/14/2021 ?Therapeutic Exercise: ?NuStep L7 x 6 min with UE/LE while taking subjective ?SLR 2 x 10 each ?Bridge 2 x 10 (partial) ?Hooklying clamshell with black 2 x 15 ?Sidelying hip abduction 2 x 15 ?Sit to stand from chair with hands on thighs 2 x 10 ?2MWT for workload capacity training - 300 ft using RW ?Neuromuscular re-ed: (performed at [facility]) ?Romberg stance on Airex 3 x 60 sec - patient cued for to look certain directions  ?Alternating march  on Airex with fingertip support 2 x 20 ?Modified 1/2 tandem stance 2 x 60 sec each ? ? ?[facility] Adult PT Treatment:                                                DATE: 04/11/2021 ?Therapeutic Exercise: ?NuStep L5 x 5 min with UE/LE while taking subjective ?SLR 2 x 10 each ?Bridge 2 x 10 (partial) ?Sidelying hip abduction 2 x 10 ?Sit to stand from chair with hands on thighs 2 x 10 ?Neuromuscular re-ed: (performed at [facility]) ?Romberg stance on Airex 3 x 60 sec - patient cued for to look certain directions  ?Alternating march on Airex with fingertip support 2 x 20 ?Modified 1/2 tandem stance 3 x 30 sec each ?Gait training: ?Gait training using SPC, cane lowered for proper fit, instruction on proper positioning for cane and coordinating cane movement with opposite leg, proper heel toe progression and step through pattern ? ? ?  ?PATIENT EDUCATION:  ?Education details: HEP ?Person educated: Patient ?Education method: Explanation, Demonstration, Tactile cues, Verbal cues ?Education comprehension: verbalized understanding, returned demonstration, verbal cues required, tactile cues required, and needs further education ?  ?HOME EXERCISE PROGRAM: ?Access Code: [email or web address] ?  ?  ?ASSESSMENT: ?CLINICAL IMPRESSION: ?Patient presents to therapy with no current pain and reports daily HEP compliance. She continues to report high levels of fear with balance and gait training but is beginning to trust herself more and responds well to positive encouragement throughout  session. Patient was able to tolerate all prescribed exercises with no adverse effects. Patient continues to benefit from skilled PT services and should be progressed as able to improve functional indepe

## 2021-04-25 ENCOUNTER — Encounter: Payer: Self-pay | Admitting: Physical Therapy

## 2021-04-25 ENCOUNTER — Other Ambulatory Visit: Payer: Self-pay

## 2021-04-25 ENCOUNTER — Ambulatory Visit: Payer: Managed Care, Other (non HMO) | Attending: Surgical | Admitting: Physical Therapy

## 2021-04-25 DIAGNOSIS — R2689 Other abnormalities of gait and mobility: Secondary | ICD-10-CM | POA: Insufficient documentation

## 2021-04-25 DIAGNOSIS — M25571 Pain in right ankle and joints of right foot: Secondary | ICD-10-CM | POA: Diagnosis present

## 2021-04-25 DIAGNOSIS — M25572 Pain in left ankle and joints of left foot: Secondary | ICD-10-CM | POA: Diagnosis present

## 2021-04-25 DIAGNOSIS — M6281 Muscle weakness (generalized): Secondary | ICD-10-CM | POA: Diagnosis present

## 2021-04-25 NOTE — Therapy (Signed)
?OUTPATIENT PHYSICAL THERAPY TREATMENT NOTE ? ? ?Patient Name: Jodi Keller ?MRN: [id number] ?DOB:July 29, 1955, 66 y.o., female ?Today's Date: 04/25/2021 ? ?PCP: Biagio Borg, MD ?REFERRING PROVIDER:  Fayette Pho, PA ? ? PT End of Session - 04/25/21 1704   ? ? Visit Number 9   ? Number of Visits 16   ? Date for PT Re-Evaluation 05/11/21   ? Authorization Type [employer] / MCR   ? PT Start Time 1700   ? PT Stop Time 1740   ? PT Time Calculation (min) 40 min   ? Activity Tolerance Patient tolerated treatment well   ? Behavior During Therapy [facility] for tasks assessed/performed   ? ?  ?  ? ?  ? ? ? ? ? ? ? ? ? ?Past Medical History:  ?Diagnosis Date  ? Anemia   ? Bilateral carpal tunnel syndrome 12/16/2010  ? 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 Generalized anxiety disorder 11/17/2014  ? GERD (gastroesophageal reflux disease)   ? Hyperlipidemia 02/25/2011  ? Hypertension   ? Morbid obesity ([place])   ? Uterine prolapse   ? ?Past Surgical History:  ?Procedure Laterality Date  ? BALLOON DILATION N/A 12/29/2019  ? Procedure: BALLOON DILATION;  Surgeon: Irene Shipper, MD;  Location: [facility];  Service: Endoscopy;  Laterality: N/A;  ? CESAREAN SECTION    ? COLONOSCOPY WITH PROPOFOL N/A 12/29/2019  ? Procedure: COLONOSCOPY WITH PROPOFOL;  Surgeon: Irene Shipper, MD;  Location: [facility];  Service: Endoscopy;  Laterality: N/A;  ? ESOPHAGOGASTRODUODENOSCOPY (EGD) WITH PROPOFOL N/A 12/29/2019  ? Procedure: ESOPHAGOGASTRODUODENOSCOPY (EGD) WITH PROPOFOL;  Surgeon: Irene Shipper, MD;  Location: [facility];  Service: Endoscopy;  Laterality: N/A;  ? EYE SURGERY    ? cataract surgery per left eye   ? FOOT SURGERY    ? ?Patient Active Problem List  ? Diagnosis Date Noted  ? CKD (chronic kidney disease) stage 3, GFR 30-59 ml/min (HCC) 12/14/2020  ? Esophageal dysphagia   ? Esophageal stricture   ? Stress incontinence 12/12/2019  ? Ganglion cyst of dorsum of right wrist 09/04/2018  ? Acne 09/04/2018  ? Cellulitis 02/01/2017  ?  Biceps muscle tear 01/31/2017  ? Cough 01/27/2016  ? Wheezing 01/27/2016  ? Low back pain 01/27/2016  ? Left rotator cuff tear 09/17/2015  ? Left shoulder pain 09/03/2015  ? Hidradenitis 06/16/2015  ? Chest pain 06/16/2015  ? Generalized anxiety disorder 11/17/2014  ? Gait disorder 11/17/2014  ? Intertrigo 11/17/2014  ? Left knee pain 12/17/2013  ? Left otitis media 07/04/2013  ? Vertigo 06/26/2012  ? Allergic rhinitis, cause unspecified 06/26/2012  ? Eustachian tube dysfunction 06/26/2012  ? Polycythemia 12/20/2011  ? Peripheral edema 07/21/2011  ? Impaired glucose tolerance 07/21/2011  ? Hyperlipidemia 02/25/2011  ? Right leg pain 02/24/2011  ? Bilateral carpal tunnel syndrome 12/16/2010  ? 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 Colon cancer screening 12/10/2010  ? FATIGUE 10/30/2007  ? Morbid obesity ([place]) 11/13/2006  ? ANEMIA-IRON DEFICIENCY 11/13/2006  ? Essential hypertension 11/13/2006  ? GERD 11/13/2006  ? ? ?REFERRING DIAG: Primary osteoarthritis, right ankle and foot ? ?THERAPY DIAG:  ?Other abnormalities of gait and mobility ? ?Pain in right ankle and joints of right foot ? ?Pain in left ankle and joints of left foot ? ?Muscle weakness (generalized) ? ?PERTINENT HISTORY: Right ankle fusion 2022 ? ?PRECAUTIONS: Fall ? ?ONSET DATE: 03/31/2020 (DOS for right ankle fusion) ? ?SUBJECTIVE:  ?Patient report she is feeling  fine. She has been trying to walk a little bit more with her quad cane.  ? ?PAIN:  ?Are you having pain? No (currently) ?NPRS scale: 0/10 (3/10 when going from sitting to standing) ?Pain location: Ankle ?Pain orientation: Bilateral (left > right) ?PAIN TYPE: Chronic ?Pain description: Intermittent, sore ?Aggravating factors: Walking ?Relieving factors: Rest ? ?PATIENT GOALS: Improve balance to avoid falls, walk without rollator, be able to get into pool for water aerobics ? ? ?OBJECTIVE:  ?PATIENT SURVEYS:  ?FOTO: 44% functional status - 04/11/2021; 44% at intake (balance) ?   ?POSTURE: ?Patient with rounded shoulders, increased lumbar lordosis with forward trunk lean, knee valgus ?  ?LE AROM/PROM: ?Patient demonstrates gross deficits of bilateral ankle motion, not formally assessed this visit ?  ?LE MMT: ?  ?Unable to formally assess ankle strength, she does generally demonstrate gross weakness and poor control of ankle/foot ?  ?MMT Right ?03/16/2021 Left ?03/16/2021 Rt / Lt ?04/14/2021  ?Hip flexion '4 4 4 '$ / 4  ?Hip abduction '4 4 4 '$ / 4  ?Knee flexion 5 5   ?Knee extension 4+ 4+   ?  ?FUNCTIONAL TESTS:  ?5 times sit to stand: 16 seconds with hands on thighs - 04/11/2021 (17 seconds at intake and patient required to use BUE support on armrests to stand) ?Timed up and go (TUG): 17 seconds (patient using rollator) ?2 minute walk test: 300 feet (patient using rollator) - 04/14/2021 ?  ?GAIT: - reassessed 04/14/2021 ?Distance walked: 300 ft ?Assistive device utilized: Rollator ?Level of assistance: Modified independence ?Comments: patient tends to ambulate on lateral aspect of foot, heavy reliance of rollator with forward trunk lean, trendelenburg, minimal heel strike or toe off ?  ?  ?TODAY'S TREATMENT: ?[facility] Adult PT Treatment:                                                DATE: 04/25/2021 ?Therapeutic Exercise: ?NuStep L7 x 5 min with UE/LE while taking subjective ?LAQ 4# 2 x 15 each ?Standing hamstring curl with 4# 2 x 15 each ?Standing alternating march with 4# 2 x 20 ?Sit to stand from chair with hands on thighs 2 x 10 ?Standing hip abduction with red at knee 2 x 10 each ?Standing hip extension red at knee 2 x 10 ?Standing row with FM 7# 2 x 15 ?Neuromuscular re-ed: (performed at [facility]) ?Romberg stance on Airex 3 x 60 sec ?Modified 1/2 tandem stance 2 x 60 sec each ? ? ?[place] Adult PT Treatment:                                                DATE: 04/20/2021 ?Therapeutic Exercise: ?NuStep L7 x 5 min with UE/LE while taking subjective ?LAQ 3# 2x10 BIL ?SLR 2 x 10 each ?Seated hamstring curl BlueTB  2x10 BIL ?Bridge 2 x 10 (partial) ?Hooklying clamshell with black 2 x 15 ?Sit to stand from chair with hands on thighs 2 x 10 ?Neuromuscular re-ed: (performed at [facility]) ?Romberg stance on Airex 3 x 60 sec - patient cued for to look certain directions  ?Alternating march on Airex with fingertip support 2 x 20 ?Modified 1/2 tandem stance 2 x 60 sec each ?Gait training: ?Gait training [phone] using quad  cane, instruction on proper positioning for cane and coordinating cane movement with opposite leg, proper heel toe progression and step through pattern ? ?[facility] Adult PT Treatment:                                                DATE: 04/14/2021 ?Therapeutic Exercise: ?NuStep L7 x 6 min with UE/LE while taking subjective ?SLR 2 x 10 each ?Bridge 2 x 10 (partial) ?Hooklying clamshell with black 2 x 15 ?Sidelying hip abduction 2 x 15 ?Sit to stand from chair with hands on thighs 2 x 10 ?2MWT for workload capacity training - 300 ft using RW ?Neuromuscular re-ed: (performed at [facility]) ?Romberg stance on Airex 3 x 60 sec - patient cued for to look certain directions  ?Alternating march on Airex with fingertip support 2 x 20 ?Modified 1/2 tandem stance 2 x 60 sec each ? ?PATIENT EDUCATION:  ?Education details: HEP ?Person educated: Patient ?Education method: Explanation, Demonstration, Tactile cues, Verbal cues ?Education comprehension: verbalized understanding, returned demonstration, verbal cues required, tactile cues required, and needs further education ?  ?HOME EXERCISE PROGRAM: ?Access Code: [email or web address] ?  ?  ?ASSESSMENT: ?CLINICAL IMPRESSION: ?Patient tolerated therapy well with no adverse effects. Therapy focused on continued balance training and strengthening. She was able to tolerate greater level of standing exercises this visit but did report some low back pain with extended periods of standing. She continues to have high fear levels with standing tasks especially without UE support. Overall she seems to be improving with  activity tolerance and functional mobility. Patient would benefit from continued skilled PT to progress her strength and balance in order to improve walking and maximize functional ability. ? ?  ?OBJECTIVE IMPAIR

## 2021-05-05 NOTE — Therapy (Signed)
?OUTPATIENT PHYSICAL THERAPY TREATMENT NOTE ? ? ?Patient Name: Jodi Keller ?MRN: [id number] ?DOB:Nov 06, 1955, 66 y.o., female ?Today's Date: 05/06/2021 ? ?PCP: Biagio Borg, MD ?REFERRING PROVIDER:  Fayette Pho, PA ? ? PT End of Session - 05/06/21 1530   ? ? Visit Number 10   ? Number of Visits 16   ? Date for PT Re-Evaluation 05/11/21   ? Authorization Type [employer] / MCR   ? PT Start Time 1530   ? PT Stop Time 1615   ? PT Time Calculation (min) 45 min   ? Activity Tolerance Patient tolerated treatment well   ? Behavior During Therapy [facility] for tasks assessed/performed   ? ?  ?  ? ?  ? ? ? ? ? ? ? ? ? ? ?Past Medical History:  ?Diagnosis Date  ? Anemia   ? Bilateral carpal tunnel syndrome 12/16/2010  ? 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 Generalized anxiety disorder 11/17/2014  ? GERD (gastroesophageal reflux disease)   ? Hyperlipidemia 02/25/2011  ? Hypertension   ? Morbid obesity ([place])   ? Uterine prolapse   ? ?Past Surgical History:  ?Procedure Laterality Date  ? BALLOON DILATION N/A 12/29/2019  ? Procedure: BALLOON DILATION;  Surgeon: Irene Shipper, MD;  Location: [facility];  Service: Endoscopy;  Laterality: N/A;  ? CESAREAN SECTION    ? COLONOSCOPY WITH PROPOFOL N/A 12/29/2019  ? Procedure: COLONOSCOPY WITH PROPOFOL;  Surgeon: Irene Shipper, MD;  Location: [facility];  Service: Endoscopy;  Laterality: N/A;  ? ESOPHAGOGASTRODUODENOSCOPY (EGD) WITH PROPOFOL N/A 12/29/2019  ? Procedure: ESOPHAGOGASTRODUODENOSCOPY (EGD) WITH PROPOFOL;  Surgeon: Irene Shipper, MD;  Location: [facility];  Service: Endoscopy;  Laterality: N/A;  ? EYE SURGERY    ? cataract surgery per left eye   ? FOOT SURGERY    ? ?Patient Active Problem List  ? Diagnosis Date Noted  ? CKD (chronic kidney disease) stage 3, GFR 30-59 ml/min (HCC) 12/14/2020  ? Esophageal dysphagia   ? Esophageal stricture   ? Stress incontinence 12/12/2019  ? Ganglion cyst of dorsum of right wrist 09/04/2018  ? Acne 09/04/2018  ? Cellulitis 02/01/2017   ? Biceps muscle tear 01/31/2017  ? Cough 01/27/2016  ? Wheezing 01/27/2016  ? Low back pain 01/27/2016  ? Left rotator cuff tear 09/17/2015  ? Left shoulder pain 09/03/2015  ? Hidradenitis 06/16/2015  ? Chest pain 06/16/2015  ? Generalized anxiety disorder 11/17/2014  ? Gait disorder 11/17/2014  ? Intertrigo 11/17/2014  ? Left knee pain 12/17/2013  ? Left otitis media 07/04/2013  ? Vertigo 06/26/2012  ? Allergic rhinitis, cause unspecified 06/26/2012  ? Eustachian tube dysfunction 06/26/2012  ? Polycythemia 12/20/2011  ? Peripheral edema 07/21/2011  ? Impaired glucose tolerance 07/21/2011  ? Hyperlipidemia 02/25/2011  ? Right leg pain 02/24/2011  ? Bilateral carpal tunnel syndrome 12/16/2010  ? 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 Colon cancer screening 12/10/2010  ? FATIGUE 10/30/2007  ? Morbid obesity ([place]) 11/13/2006  ? ANEMIA-IRON DEFICIENCY 11/13/2006  ? Essential hypertension 11/13/2006  ? GERD 11/13/2006  ? ? ?REFERRING DIAG: Primary osteoarthritis, right ankle and foot ? ?THERAPY DIAG:  ?Other abnormalities of gait and mobility ? ?Pain in right ankle and joints of right foot ? ?Pain in left ankle and joints of left foot ? ?Muscle weakness (generalized) ? ?PERTINENT HISTORY: Right ankle fusion 2022 ? ?PRECAUTIONS: Fall ? ?ONSET DATE: 03/31/2020 (DOS for right ankle fusion) ? ?SUBJECTIVE: Patient report she is feeling  fine. She isn't have any pain currently.  ? ?PAIN:  ?Are you having pain? No (currently) ?NPRS scale: 0/10 (3/10 when going from sitting to standing) ?Pain location: Ankle ?Pain orientation: Bilateral (left > right) ?PAIN TYPE: Chronic ?Pain description: Intermittent, sore ?Aggravating factors: Walking ?Relieving factors: Rest ? ?PATIENT GOALS: Improve balance to avoid falls, walk without rollator, be able to get into pool for water aerobics ? ? ?OBJECTIVE:  ?PATIENT SURVEYS:  ?FOTO: 44% functional status - 04/11/2021; 44% at intake (balance) ?  ?POSTURE: ?Patient with rounded  shoulders, increased lumbar lordosis with forward trunk lean, knee valgus ?  ?LE AROM/PROM: ?Patient demonstrates gross deficits of bilateral ankle motion, not formally assessed this visit ?  ?LE MMT: ?  ?Unable to formally assess ankle strength, she does generally demonstrate gross weakness and poor control of ankle/foot ?  ?MMT Right ?03/16/2021 Left ?03/16/2021 Rt / Lt ?04/14/2021  ?Hip flexion '4 4 4 '$ / 4  ?Hip abduction '4 4 4 '$ / 4  ?Knee flexion 5 5   ?Knee extension 4+ 4+   ?  ?FUNCTIONAL TESTS:  ?5 times sit to stand: 16 seconds with hands on thighs - 04/11/2021 (17 seconds at intake and patient required to use BUE support on armrests to stand) ?Timed up and go (TUG): 17 seconds (patient using rollator) ?2 minute walk test: 300 feet (patient using rollator) - 04/14/2021 ?  ?GAIT: - reassessed 04/14/2021 ?Distance walked: 300 ft ?Assistive device utilized: Rollator ?Level of assistance: Modified independence ?Comments: patient tends to ambulate on lateral aspect of foot, heavy reliance of rollator with forward trunk lean, trendelenburg, minimal heel strike or toe off ?  ?  ?TODAY'S TREATMENT: ?[facility] Adult PT Treatment:                                                DATE: 05/06/2021 ?Aquatic therapy at [address] - therapeutic pool temp 93 degrees ?Pt enters building ambulating with rollator. Treatment took place in water 3.8 to  4 ft 8 in.feet deep depending upon activity.  Pt entered and exited the pool via stair and handrails with up to CGA. ?Pt pain level 0 at initiation of water walking. ? ?Therapeutic Exercise: ?Walking forwards/backwards/side stepping ?Runners stretch x30" BIL ?Hamstring stretch x30" BIL ?Tandem stance 2x30" BIL ?SLS 2x30" BIL ?Lunge walk x3 laps ?On edge of pool with bil UE support, Pt performed LE exercise: ?Hip abd/add R/L 20 x each and then using 1 UE support ?Hip ext/flex with knee straight x 20, pt needing VC and TC for correct execution and sequencing ?Hip Circles bil  CC/CCW x10 each ?Hip flexion, knee extension x10 BIL ?Hamstring curl x20 BIL ?Squats x 20 reps with intermittent UE support x 2 sets ?Standing side lunge stretch x30" BIL ? Seated on bench in pool: ?Seated bicycle x1' ?Seated scissor x1' ?Seated flutter x1' ?Seated kickboard push/pull x20 ?Seated kickboard push down x20 ? ? ?Pt requires the buoyancy of water for active assisted exercises with buoyancy supported for strengthening and AROM exercises. Hydrostatic pressure also supports joints by unweighting joint load by at least 50 % in 3-4 feet depth water. 80% in chest to neck deep water. Water will provide assistance with movement using the current and laminar flow while the buoyancy reduces weight bearing. Pt requires the viscosity of the water for resistance with strengthening exercises. ? ? ? ?  [facility] Adult PT Treatment:                                                DATE: 04/25/2021 ?Therapeutic Exercise: ?NuStep L7 x 5 min with UE/LE while taking subjective ?LAQ 4# 2 x 15 each ?Standing hamstring curl with 4# 2 x 15 each ?Standing alternating march with 4# 2 x 20 ?Sit to stand from chair with hands on thighs 2 x 10 ?Standing hip abduction with red at knee 2 x 10 each ?Standing hip extension red at knee 2 x 10 ?Standing row with FM 7# 2 x 15 ?Neuromuscular re-ed: (performed at [facility]) ?Romberg stance on Airex 3 x 60 sec ?Modified 1/2 tandem stance 2 x 60 sec each ? ? ?[place] Adult PT Treatment:                                                DATE: 04/20/2021 ?Therapeutic Exercise: ?NuStep L7 x 5 min with UE/LE while taking subjective ?LAQ 3# 2x10 BIL ?SLR 2 x 10 each ?Seated hamstring curl BlueTB 2x10 BIL ?Bridge 2 x 10 (partial) ?Hooklying clamshell with black 2 x 15 ?Sit to stand from chair with hands on thighs 2 x 10 ?Neuromuscular re-ed: (performed at [facility]) ?Romberg stance on Airex 3 x 60 sec - patient cued for to look certain directions  ?Alternating march on Airex with fingertip support 2 x 20 ?Modified 1/2 tandem  stance 2 x 60 sec each ?Gait training: ?Gait training [phone] using quad cane, instruction on proper positioning for cane and coordinating cane movement with opposite leg, proper heel toe progression and step th

## 2021-05-06 ENCOUNTER — Ambulatory Visit: Payer: Managed Care, Other (non HMO)

## 2021-05-06 DIAGNOSIS — M6281 Muscle weakness (generalized): Secondary | ICD-10-CM

## 2021-05-06 DIAGNOSIS — M25572 Pain in left ankle and joints of left foot: Secondary | ICD-10-CM

## 2021-05-06 DIAGNOSIS — R2689 Other abnormalities of gait and mobility: Secondary | ICD-10-CM | POA: Diagnosis not present

## 2021-05-06 DIAGNOSIS — M25571 Pain in right ankle and joints of right foot: Secondary | ICD-10-CM

## 2021-05-09 NOTE — Therapy (Signed)
?OUTPATIENT PHYSICAL THERAPY TREATMENT NOTE ? ? ?Patient Name: Jodi Keller ?MRN: [id number] ?DOB:1955-08-14, 66 y.o., female ?Today's Date: 05/11/2021 ? ?PCP: Biagio Borg, MD ?REFERRING PROVIDER:  Fayette Pho, PA ? ? PT End of Session - 05/11/21 1624   ? ? Visit Number 11   ? Number of Visits 19   ? Date for PT Re-Evaluation 07/06/21   ? Authorization Type [employer] / MCR   ? PT Start Time 1615   ? PT Stop Time 1700   ? PT Time Calculation (min) 45 min   ? Activity Tolerance Patient tolerated treatment well   ? Behavior During Therapy [facility] for tasks assessed/performed   ? ?  ?  ? ?  ? ? ? ? ? ? ? ? ? ? ?Past Medical History:  ?Diagnosis Date  ? Anemia   ? Bilateral carpal tunnel syndrome 12/16/2010  ? 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 Generalized anxiety disorder 11/17/2014  ? GERD (gastroesophageal reflux disease)   ? Hyperlipidemia 02/25/2011  ? Hypertension   ? Morbid obesity ([place])   ? Uterine prolapse   ? ?Past Surgical History:  ?Procedure Laterality Date  ? BALLOON DILATION N/A 12/29/2019  ? Procedure: BALLOON DILATION;  Surgeon: Irene Shipper, MD;  Location: [facility];  Service: Endoscopy;  Laterality: N/A;  ? CESAREAN SECTION    ? COLONOSCOPY WITH PROPOFOL N/A 12/29/2019  ? Procedure: COLONOSCOPY WITH PROPOFOL;  Surgeon: Irene Shipper, MD;  Location: [facility];  Service: Endoscopy;  Laterality: N/A;  ? ESOPHAGOGASTRODUODENOSCOPY (EGD) WITH PROPOFOL N/A 12/29/2019  ? Procedure: ESOPHAGOGASTRODUODENOSCOPY (EGD) WITH PROPOFOL;  Surgeon: Irene Shipper, MD;  Location: [facility];  Service: Endoscopy;  Laterality: N/A;  ? EYE SURGERY    ? cataract surgery per left eye   ? FOOT SURGERY    ? ?Patient Active Problem List  ? Diagnosis Date Noted  ? CKD (chronic kidney disease) stage 3, GFR 30-59 ml/min (HCC) 12/14/2020  ? Esophageal dysphagia   ? Esophageal stricture   ? Stress incontinence 12/12/2019  ? Ganglion cyst of dorsum of right wrist 09/04/2018  ? Acne 09/04/2018  ? Cellulitis 02/01/2017   ? Biceps muscle tear 01/31/2017  ? Cough 01/27/2016  ? Wheezing 01/27/2016  ? Low back pain 01/27/2016  ? Left rotator cuff tear 09/17/2015  ? Left shoulder pain 09/03/2015  ? Hidradenitis 06/16/2015  ? Chest pain 06/16/2015  ? Generalized anxiety disorder 11/17/2014  ? Gait disorder 11/17/2014  ? Intertrigo 11/17/2014  ? Left knee pain 12/17/2013  ? Left otitis media 07/04/2013  ? Vertigo 06/26/2012  ? Allergic rhinitis, cause unspecified 06/26/2012  ? Eustachian tube dysfunction 06/26/2012  ? Polycythemia 12/20/2011  ? Peripheral edema 07/21/2011  ? Impaired glucose tolerance 07/21/2011  ? Hyperlipidemia 02/25/2011  ? Right leg pain 02/24/2011  ? Bilateral carpal tunnel syndrome 12/16/2010  ? 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 Colon cancer screening 12/10/2010  ? FATIGUE 10/30/2007  ? Morbid obesity ([place]) 11/13/2006  ? ANEMIA-IRON DEFICIENCY 11/13/2006  ? Essential hypertension 11/13/2006  ? GERD 11/13/2006  ? ? ?REFERRING DIAG: Primary osteoarthritis, right ankle and foot ? ?THERAPY DIAG:  ?Other abnormalities of gait and mobility ? ?Pain in right ankle and joints of right foot ? ?Pain in left ankle and joints of left foot ? ?Muscle weakness (generalized) ? ?PERTINENT HISTORY: Right ankle fusion 2022 ? ?PRECAUTIONS: Fall ? ?ONSET DATE: 03/31/2020 (DOS for right ankle fusion) ? ?SUBJECTIVE:  ?Patient reports she is  doing well, the aquatic therapy went great and she has been trying to walk more without her rollator at home. She saw her doctor who gave her an updated referral formore PT. ? ?PAIN:  ?Are you having pain? No (currently) ?NPRS scale: 0/10 (3/10 when going from sitting to standing) ?Pain location: Ankle ?Pain orientation: Bilateral (left > right) ?PAIN TYPE: Chronic ?Pain description: Intermittent, sore ?Aggravating factors: Walking ?Relieving factors: Rest ? ?PATIENT GOALS: Improve balance to avoid falls, walk without rollator, be able to get into pool for water  aerobics ? ? ?OBJECTIVE:  ?PATIENT SURVEYS:  ?FOTO: 51% functional status - 05/11/2021 (44% functional status - 04/11/2021; 44% at intake) ?  ?POSTURE: ?Patient with rounded shoulders, increased lumbar lordosis with forward trunk lean, knee valgus ?  ?LE AROM/PROM: ?Patient demonstrates gross deficits of bilateral ankle motion, not formally assessed this visit ?  ?LE MMT: ?  ?Unable to formally assess ankle strength, she does generally demonstrate gross weakness and poor control of ankle/foot ?  ?MMT Right ?03/16/2021 Left ?03/16/2021 Rt / Lt ?04/14/2021  ?Hip flexion '4 4 4 '$ / 4  ?Hip abduction '4 4 4 '$ / 4  ?Knee flexion 5 5   ?Knee extension 4+ 4+   ?  ?FUNCTIONAL TESTS:  ?5 times sit to stand: 11 with hands on thighs - 05/11/2021 (16 seconds with hands on thighs - 04/11/2021; 17 seconds at intake and patient required to use BUE support on armrests to stand) ? ?Timed up and go (TUG): 13 seconds using rollator - 05/11/2021 (17 seconds using rollator at intake) ? ?2 minute walk test: 337 ft using rollator - 05/11/2021 (300 feet using rollator - 04/14/2021) ?  ?GAIT: - reassessed 05/11/2021 ?Distance walked: 300 ft ?Assistive device utilized: Rollator ?Level of assistance: Modified independence ?Comments: patient tends to ambulate on lateral aspect of foot, minimal reliance of rollator with slight forward trunk lean, trendelenburg, minimal heel strike or toe off ?  ?  ?TODAY'S TREATMENT: ?[facility] Adult PT Treatment:                                                DATE: 05/11/2021 ?Therapeutic Exercise: ?NuStep L7 x 5 min with UE/LE while taking subjective ?SLR 2 x 10 each ?Bridge 2 x 10 ?LAQ 5# 2 x 15 each ?Sit to stand x 10 with UE support on thighs, x 10 without UE support ?2MWT for workload capacity ?Standing hip abduction 2 x 15 each ?Standing hip extension 2 x 15 ?Neuromuscular re-ed: (performed at [facility]) ?Romberg stance on Airex 3 x 60 sec ?Modified 1/2 tandem stance 2 x 60 sec each ? ? ?[facility] Adult PT Treatment:                                                 DATE: 04/25/2021 ?Therapeutic Exercise: ?NuStep L7 x 5 min with UE/LE while taking subjective ?LAQ 4# 2 x 15 each ?Standing hamstring curl with 4# 2 x 15 each ?Standing alternating march with 4# 2 x 20 ?Sit to stand from chair with hands on thighs 2 x 10 ?Standing hip abduction with red at knee 2 x 10 each ?Standing hip extension red at knee 2 x 10 ?Standing row with FM 7# 2  x 15 ?Neuromuscular re-ed: (performed at [facility]) ?Romberg stance on Airex 3 x 60 sec ?Modified 1/2 tandem stance 2 x 60 sec each ? ?[facility] Adult PT Treatment:                                                DATE: 04/20/2021 ?Therapeutic Exercise: ?NuStep L7 x 5 min with UE/LE while taking subjective ?LAQ 3# 2x10 BIL ?SLR 2 x 10 each ?Seated hamstring curl BlueTB 2x10 BIL ?Bridge 2 x 10 (partial) ?Hooklying clamshell with black 2 x 15 ?Sit to stand from chair with hands on thighs 2 x 10 ?Neuromuscular re-ed: (performed at [facility]) ?Romberg stance on Airex 3 x 60 sec - patient cued for to look certain directions  ?Alternating march on Airex with fingertip support 2 x 20 ?Modified 1/2 tandem stance 2 x 60 sec each ?Gait training: ?Gait training [phone] using quad cane, instruction on proper positioning for cane and coordinating cane movement with opposite leg, proper heel toe progression and step through pattern ? ?PATIENT EDUCATION:  ?Education details: POC extension, FOTO and progress toward goals, HEP, continue aquatic therapy ?Person educated: Patient ?Education method: Explanation, Demonstration, Tactile cues, Verbal cues ?Education comprehension: verbalized understanding, returned demonstration, verbal cues required, tactile cues required, and needs further education ?  ?HOME EXERCISE PROGRAM: ?Access Code: [email or web address] ?  ?  ?ASSESSMENT: ?CLINICAL IMPRESSION: ?Patient tolerated therapy well with no adverse effects. She is making great progress toward her goals, achieved multiple LTGs but modified the goals to allow for further progress  with therapy. She demonstrates improvement in her over all mobility and report of functional ability on FOTO. She does continue to exhibit gross balance deficits and reports fear with balance tasks. Therapy continues to

## 2021-05-11 ENCOUNTER — Encounter: Payer: Self-pay | Admitting: Physical Therapy

## 2021-05-11 ENCOUNTER — Ambulatory Visit: Payer: Managed Care, Other (non HMO) | Admitting: Physical Therapy

## 2021-05-11 ENCOUNTER — Other Ambulatory Visit: Payer: Self-pay

## 2021-05-11 DIAGNOSIS — R2689 Other abnormalities of gait and mobility: Secondary | ICD-10-CM

## 2021-05-11 DIAGNOSIS — M25571 Pain in right ankle and joints of right foot: Secondary | ICD-10-CM

## 2021-05-11 DIAGNOSIS — M25572 Pain in left ankle and joints of left foot: Secondary | ICD-10-CM

## 2021-05-11 DIAGNOSIS — M6281 Muscle weakness (generalized): Secondary | ICD-10-CM

## 2021-05-19 NOTE — Therapy (Signed)
?OUTPATIENT PHYSICAL THERAPY TREATMENT NOTE ? ? ?Patient Name: Jodi Keller ?MRN: [id number] ?DOB:1955/11/03, 66 y.o., female ?Today's Date: 05/20/2021 ? ?PCP: Biagio Borg, MD ?REFERRING PROVIDER:  Fayette Pho, PA ? ? PT End of Session - 05/20/21 1607   ? ? Visit Number 12   ? Number of Visits 19   ? Date for PT Re-Evaluation 07/06/21   ? Authorization Type [employer] / MCR   ? PT Start Time 0400   ? PT Stop Time 0445   ? PT Time Calculation (min) 45 min   ? Activity Tolerance Patient tolerated treatment well   ? Behavior During Therapy [facility] for tasks assessed/performed   ? ?  ?  ? ?  ? ? ? ? ? ? ? ? ? ? ? ?Past Medical History:  ?Diagnosis Date  ? Anemia   ? Bilateral carpal tunnel syndrome 12/16/2010  ? 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 Generalized anxiety disorder 11/17/2014  ? GERD (gastroesophageal reflux disease)   ? Hyperlipidemia 02/25/2011  ? Hypertension   ? Morbid obesity ([place])   ? Uterine prolapse   ? ?Past Surgical History:  ?Procedure Laterality Date  ? BALLOON DILATION N/A 12/29/2019  ? Procedure: BALLOON DILATION;  Surgeon: Irene Shipper, MD;  Location: [facility];  Service: Endoscopy;  Laterality: N/A;  ? CESAREAN SECTION    ? COLONOSCOPY WITH PROPOFOL N/A 12/29/2019  ? Procedure: COLONOSCOPY WITH PROPOFOL;  Surgeon: Irene Shipper, MD;  Location: [facility];  Service: Endoscopy;  Laterality: N/A;  ? ESOPHAGOGASTRODUODENOSCOPY (EGD) WITH PROPOFOL N/A 12/29/2019  ? Procedure: ESOPHAGOGASTRODUODENOSCOPY (EGD) WITH PROPOFOL;  Surgeon: Irene Shipper, MD;  Location: [facility];  Service: Endoscopy;  Laterality: N/A;  ? EYE SURGERY    ? cataract surgery per left eye   ? FOOT SURGERY    ? ?Patient Active Problem List  ? Diagnosis Date Noted  ? CKD (chronic kidney disease) stage 3, GFR 30-59 ml/min (HCC) 12/14/2020  ? Esophageal dysphagia   ? Esophageal stricture   ? Stress incontinence 12/12/2019  ? Ganglion cyst of dorsum of right wrist 09/04/2018  ? Acne 09/04/2018  ? Cellulitis  02/01/2017  ? Biceps muscle tear 01/31/2017  ? Cough 01/27/2016  ? Wheezing 01/27/2016  ? Low back pain 01/27/2016  ? Left rotator cuff tear 09/17/2015  ? Left shoulder pain 09/03/2015  ? Hidradenitis 06/16/2015  ? Chest pain 06/16/2015  ? Generalized anxiety disorder 11/17/2014  ? Gait disorder 11/17/2014  ? Intertrigo 11/17/2014  ? Left knee pain 12/17/2013  ? Left otitis media 07/04/2013  ? Vertigo 06/26/2012  ? Allergic rhinitis, cause unspecified 06/26/2012  ? Eustachian tube dysfunction 06/26/2012  ? Polycythemia 12/20/2011  ? Peripheral edema 07/21/2011  ? Impaired glucose tolerance 07/21/2011  ? Hyperlipidemia 02/25/2011  ? Right leg pain 02/24/2011  ? Bilateral carpal tunnel syndrome 12/16/2010  ? 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 Colon cancer screening 12/10/2010  ? FATIGUE 10/30/2007  ? Morbid obesity ([place]) 11/13/2006  ? ANEMIA-IRON DEFICIENCY 11/13/2006  ? Essential hypertension 11/13/2006  ? GERD 11/13/2006  ? ? ?REFERRING DIAG: Primary osteoarthritis, right ankle and foot ? ?THERAPY DIAG:  ?Other abnormalities of gait and mobility ? ?Pain in right ankle and joints of right foot ? ?Pain in left ankle and joints of left foot ? ?Muscle weakness (generalized) ? ?PERTINENT HISTORY: Right ankle fusion 2022 ? ?PRECAUTIONS: Fall ? ?ONSET DATE: 03/31/2020 (DOS for right ankle fusion) ? ?SUBJECTIVE: Patient presents to therapy  enthusiastic about aquatics.  ? ?PAIN:  ?Are you having pain? No (currently) ?NPRS scale: 0/10 (3/10 when going from sitting to standing) ?Pain location: Ankle ?Pain orientation: Bilateral (left > right) ?PAIN TYPE: Chronic ?Pain description: Intermittent, sore ?Aggravating factors: Walking ?Relieving factors: Rest ? ?PATIENT GOALS: Improve balance to avoid falls, walk without rollator, be able to get into pool for water aerobics ? ? ?OBJECTIVE:  ?PATIENT SURVEYS:  ?FOTO: 51% functional status - 05/11/2021 (44% functional status - 04/11/2021; 44% at intake) ?   ?POSTURE: ?Patient with rounded shoulders, increased lumbar lordosis with forward trunk lean, knee valgus ?  ?LE AROM/PROM: ?Patient demonstrates gross deficits of bilateral ankle motion, not formally assessed this visit ?  ?LE MMT: ?  ?Unable to formally assess ankle strength, she does generally demonstrate gross weakness and poor control of ankle/foot ?  ?MMT Right ?03/16/2021 Left ?03/16/2021 Rt / Lt ?04/14/2021  ?Hip flexion '4 4 4 '$ / 4  ?Hip abduction '4 4 4 '$ / 4  ?Knee flexion 5 5   ?Knee extension 4+ 4+   ?  ?FUNCTIONAL TESTS:  ?5 times sit to stand: 11 with hands on thighs - 05/11/2021 (16 seconds with hands on thighs - 04/11/2021; 17 seconds at intake and patient required to use BUE support on armrests to stand) ? ?Timed up and go (TUG): 13 seconds using rollator - 05/11/2021 (17 seconds using rollator at intake) ? ?2 minute walk test: 337 ft using rollator - 05/11/2021 (300 feet using rollator - 04/14/2021) ?  ?GAIT: - reassessed 05/11/2021 ?Distance walked: 300 ft ?Assistive device utilized: Rollator ?Level of assistance: Modified independence ?Comments: patient tends to ambulate on lateral aspect of foot, minimal reliance of rollator with slight forward trunk lean, trendelenburg, minimal heel strike or toe off ?  ?  ?TODAY'S TREATMENT: ?[facility] Adult PT Treatment:                                                DATE: 05/20/2021 ?Aquatic therapy at [address] - therapeutic pool temp 92 degrees ?Pt enters building ambulating with rollator  Treatment took place in water 3.8 to  4 ft 8 in.feet deep depending upon activity.  Pt entered and exited the pool via stair and handrails independently but walks to rails with rollator.  ? ?Therapeutic Exercise: ?Walking forwards/backwards/side stepping ?Runners stretch x30" BIL ?Hamstring stretch x30" BIL ?Figure 4 squat stretch x30" BIL ?Holding pool noodle instead of edge of pool to challenge balance: ?Hip abd/add R/L 20 x each ?Hip ext/flex with knee straight x  2 ?Hip flexion, knee extension x10 BIL ?Hamstring curl x20 BIL ?Squats x 20 reps with intermittent UE support x 2 sets ?Standing side lunge stretch x30" BIL ?Seated on bench in pool: ?Seated bicycle x1' ?Seated scissor x1' ?Seated flutter x1' ?Standing at 4'8" depth balance exercises: ?SLS x30" BIL ?Tandem stance x1' ?Tandem walking x1 lap ?  ? Pt requires the buoyancy of water for active assisted exercises with buoyancy supported for strengthening and AROM exercises. Hydrostatic pressure also supports joints by unweighting joint load by at least 50 % in 3-4 feet depth water. 80% in chest to neck deep water. Water will provide assistance with movement using the current and laminar flow while the buoyancy reduces weight bearing. Pt requires the viscosity of the water for resistance with strengthening exercises. ? ? ?[facility] Adult  PT Treatment:                                                DATE: 05/11/2021 ?Therapeutic Exercise: ?NuStep L7 x 5 min with UE/LE while taking subjective ?SLR 2 x 10 each ?Bridge 2 x 10 ?LAQ 5# 2 x 15 each ?Sit to stand x 10 with UE support on thighs, x 10 without UE support ?2MWT for workload capacity ?Standing hip abduction 2 x 15 each ?Standing hip extension 2 x 15 ?Neuromuscular re-ed: (performed at [facility]) ?Romberg stance on Airex 3 x 60 sec ?Modified 1/2 tandem stance 2 x 60 sec each ? ? ?[facility] Adult PT Treatment:                                                DATE: 04/25/2021 ?Therapeutic Exercise: ?NuStep L7 x 5 min with UE/LE while taking subjective ?LAQ 4# 2 x 15 each ?Standing hamstring curl with 4# 2 x 15 each ?Standing alternating march with 4# 2 x 20 ?Sit to stand from chair with hands on thighs 2 x 10 ?Standing hip abduction with red at knee 2 x 10 each ?Standing hip extension red at knee 2 x 10 ?Standing row with FM 7# 2 x 15 ?Neuromuscular re-ed: (performed at [facility]) ?Romberg stance on Airex 3 x 60 sec ?Modified 1/2 tandem stance 2 x 60 sec each ? ? ?PATIENT EDUCATION:  ?Education  details: POC extension, FOTO and progress toward goals, HEP, continue aquatic therapy ?Person educated: Patient ?Education method: Explanation, Demonstration, Tactile cues, Verbal cues ?Education comprehension: verbalized under

## 2021-05-20 ENCOUNTER — Ambulatory Visit: Payer: Managed Care, Other (non HMO)

## 2021-05-20 DIAGNOSIS — R2689 Other abnormalities of gait and mobility: Secondary | ICD-10-CM

## 2021-05-20 DIAGNOSIS — M25572 Pain in left ankle and joints of left foot: Secondary | ICD-10-CM

## 2021-05-20 DIAGNOSIS — M6281 Muscle weakness (generalized): Secondary | ICD-10-CM

## 2021-05-20 DIAGNOSIS — M25571 Pain in right ankle and joints of right foot: Secondary | ICD-10-CM

## 2021-05-27 ENCOUNTER — Ambulatory Visit: Payer: Managed Care, Other (non HMO)

## 2021-05-31 ENCOUNTER — Ambulatory Visit: Payer: Managed Care, Other (non HMO) | Admitting: Physical Therapy

## 2021-06-02 NOTE — Therapy (Signed)
?OUTPATIENT PHYSICAL THERAPY TREATMENT NOTE ? ? ?Patient Name: Jodi Keller ?MRN: [id number] ?DOB:Jan 28, 1955, 66 y.o., female ?Today's Date: 06/03/2021 ? ?PCP: Biagio Borg, MD ?REFERRING PROVIDER:  Fayette Pho, PA ? ? PT End of Session - 06/03/21 1600   ? ? Visit Number 13   ? Number of Visits 19   ? Date for PT Re-Evaluation 07/06/21   ? Authorization Type [employer] / MCR   ? PT Start Time 1600   ? PT Stop Time 4098   ? PT Time Calculation (min) 45 min   ? Activity Tolerance Patient tolerated treatment well   ? Behavior During Therapy [facility] for tasks assessed/performed   ? ?  ?  ? ?  ? ? ? ? ?Past Medical History:  ?Diagnosis Date  ? Anemia   ? Bilateral carpal tunnel syndrome 12/16/2010  ? 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 Generalized anxiety disorder 11/17/2014  ? GERD (gastroesophageal reflux disease)   ? Hyperlipidemia 02/25/2011  ? Hypertension   ? Morbid obesity ([place])   ? Uterine prolapse   ? ?Past Surgical History:  ?Procedure Laterality Date  ? BALLOON DILATION N/A 12/29/2019  ? Procedure: BALLOON DILATION;  Surgeon: Irene Shipper, MD;  Location: [facility];  Service: Endoscopy;  Laterality: N/A;  ? CESAREAN SECTION    ? COLONOSCOPY WITH PROPOFOL N/A 12/29/2019  ? Procedure: COLONOSCOPY WITH PROPOFOL;  Surgeon: Irene Shipper, MD;  Location: [facility];  Service: Endoscopy;  Laterality: N/A;  ? ESOPHAGOGASTRODUODENOSCOPY (EGD) WITH PROPOFOL N/A 12/29/2019  ? Procedure: ESOPHAGOGASTRODUODENOSCOPY (EGD) WITH PROPOFOL;  Surgeon: Irene Shipper, MD;  Location: [facility];  Service: Endoscopy;  Laterality: N/A;  ? EYE SURGERY    ? cataract surgery per left eye   ? FOOT SURGERY    ? ?Patient Active Problem List  ? Diagnosis Date Noted  ? CKD (chronic kidney disease) stage 3, GFR 30-59 ml/min (HCC) 12/14/2020  ? Esophageal dysphagia   ? Esophageal stricture   ? Stress incontinence 12/12/2019  ? Ganglion cyst of dorsum of right wrist 09/04/2018  ? Acne 09/04/2018  ? Cellulitis 02/01/2017  ? Biceps  muscle tear 01/31/2017  ? Cough 01/27/2016  ? Wheezing 01/27/2016  ? Low back pain 01/27/2016  ? Left rotator cuff tear 09/17/2015  ? Left shoulder pain 09/03/2015  ? Hidradenitis 06/16/2015  ? Chest pain 06/16/2015  ? Generalized anxiety disorder 11/17/2014  ? Gait disorder 11/17/2014  ? Intertrigo 11/17/2014  ? Left knee pain 12/17/2013  ? Left otitis media 07/04/2013  ? Vertigo 06/26/2012  ? Allergic rhinitis, cause unspecified 06/26/2012  ? Eustachian tube dysfunction 06/26/2012  ? Polycythemia 12/20/2011  ? Peripheral edema 07/21/2011  ? Impaired glucose tolerance 07/21/2011  ? Hyperlipidemia 02/25/2011  ? Right leg pain 02/24/2011  ? Bilateral carpal tunnel syndrome 12/16/2010  ? 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 Colon cancer screening 12/10/2010  ? FATIGUE 10/30/2007  ? Morbid obesity ([place]) 11/13/2006  ? ANEMIA-IRON DEFICIENCY 11/13/2006  ? Essential hypertension 11/13/2006  ? GERD 11/13/2006  ? ? ?REFERRING DIAG: Primary osteoarthritis, right ankle and foot ? ?THERAPY DIAG:  ?Other abnormalities of gait and mobility ? ?Pain in right ankle and joints of right foot ? ?Pain in left ankle and joints of left foot ? ?Muscle weakness (generalized) ? ?PERTINENT HISTORY: Right ankle fusion 2022 ? ?PRECAUTIONS: Fall ? ?ONSET DATE: 03/31/2020 (DOS for right ankle fusion) ? ?SUBJECTIVE: Patient reports HEP compliance. She is excited about aquatic therapy. ? ?  PAIN:  ?Are you having pain? Yes ?NPRS scale: 6/10 ?Pain location: R knee  ?PAIN TYPE: Chronic ?Pain description: Intermittent, sore ?Aggravating factors: Walking ?Relieving factors: Rest ? ?PATIENT GOALS: Improve balance to avoid falls, walk without rollator, be able to get into pool for water aerobics ? ? ?OBJECTIVE:  ?PATIENT SURVEYS:  ?FOTO: 51% functional status - 05/11/2021 (44% functional status - 04/11/2021; 44% at intake) ?  ?POSTURE: ?Patient with rounded shoulders, increased lumbar lordosis with forward trunk lean, knee valgus ?  ?LE  AROM/PROM: ?Patient demonstrates gross deficits of bilateral ankle motion, not formally assessed this visit ?  ?LE MMT: ?  ?Unable to formally assess ankle strength, she does generally demonstrate gross weakness and poor control of ankle/foot ?  ?MMT Right ?03/16/2021 Left ?03/16/2021 Rt / Lt ?04/14/2021  ?Hip flexion '4 4 4 '$ / 4  ?Hip abduction '4 4 4 '$ / 4  ?Knee flexion 5 5   ?Knee extension 4+ 4+   ?  ?FUNCTIONAL TESTS:  ?5 times sit to stand: 11 with hands on thighs - 05/11/2021 (16 seconds with hands on thighs - 04/11/2021; 17 seconds at intake and patient required to use BUE support on armrests to stand) ? ?Timed up and go (TUG): 13 seconds using rollator - 05/11/2021 (17 seconds using rollator at intake) ? ?2 minute walk test: 337 ft using rollator - 05/11/2021 (300 feet using rollator - 04/14/2021) ?  ?GAIT: - reassessed 05/11/2021 ?Distance walked: 300 ft ?Assistive device utilized: Rollator ?Level of assistance: Modified independence ?Comments: patient tends to ambulate on lateral aspect of foot, minimal reliance of rollator with slight forward trunk lean, trendelenburg, minimal heel strike or toe off ?  ?  ?TODAY'S TREATMENT: ?[facility] Adult PT Treatment:                                                DATE: 06/03/2021 ?Aquatic therapy at [address] - therapeutic pool temp 90 degrees ?Pt enters building ambulating with rollator.  Treatment took place in water 3.8 to  4 ft 8 in.feet deep depending upon activity.  Pt entered and exited the pool via stair and handrails independently but walks to rails with rollator.  ?Therapeutic Exercise: ?Walking forwards/backwards/side stepping ?Runners stretch x30" BIL ?Hamstring stretch x30" BIL ?Figure 4 squat stretch x30" BIL ?Side lunge walking x2 laps ?Forward lunge walking x2 laps ?Walking with march x2 laps ?Step ups forward/lateral 2x10 ea BIL ?Heel raises 2x10 ?Holding pool noodle instead of edge of pool to challenge balance: ?Hip abd/add R/L 20 x each ?Hip  ext/flex with knee straight x 20 BIL ?Hip flexion, knee extension x10 BIL ?Hamstring curl x20 BIL ?Squats x 20 reps with intermittent UE support x 2 sets ?Seated on bench in pool: ?Seated bicycle x1' ?Seated scissor x1' ?Seated flutter x1' ?Seated kickboard push/pull x1' ?Seated kickboard push down x1' ?Standing at 4'8" depth balance exercises: ?SLS x30" BIL ?Tandem stance x1' BIL ?Pt requires the buoyancy of water for active assisted exercises with buoyancy supported for strengthening and AROM exercises. Hydrostatic pressure also supports joints by unweighting joint load by at least 50 % in 3-4 feet depth water. 80% in chest to neck deep water. Water will provide assistance with movement using the current and laminar flow while the buoyancy reduces weight bearing. Pt requires the viscosity of the water for resistance with strengthening exercises. ? ? ?  [facility] Adult PT Treatment:                                                DATE: 05/20/2021 ?Aquatic therapy at [address] - therapeutic pool temp 92 degrees ?Pt enters building ambulating with rollator  Treatment took place in water 3.8 to  4 ft 8 in.feet deep depending upon activity.  Pt entered and exited the pool via stair and handrails independently but walks to rails with rollator.  ? ?Therapeutic Exercise: ?Walking forwards/backwards/side stepping ?Runners stretch x30" BIL ?Hamstring stretch x30" BIL ?Figure 4 squat stretch x30" BIL ?Holding pool noodle instead of edge of pool to challenge balance: ?Hip abd/add R/L 20 x each ?Hip ext/flex with knee straight x 2 ?Hip flexion, knee extension x10 BIL ?Hamstring curl x20 BIL ?Squats x 20 reps with intermittent UE support x 2 sets ?Standing side lunge stretch x30" BIL ?Seated on bench in pool: ?Seated bicycle x1' ?Seated scissor x1' ?Seated flutter x1' ?Standing at 4'8" depth balance exercises: ?SLS x30" BIL ?Tandem stance x1' ?Tandem walking x1 lap ?  ? Pt requires the buoyancy of water for active  assisted exercises with buoyancy supported for strengthening and AROM exercises. Hydrostatic pressure also supports joints by unweighting joint load by at least 50 % in 3-4 feet depth water. 80% in chest to n

## 2021-06-03 ENCOUNTER — Ambulatory Visit: Payer: Managed Care, Other (non HMO) | Attending: Surgical

## 2021-06-03 DIAGNOSIS — M25571 Pain in right ankle and joints of right foot: Secondary | ICD-10-CM | POA: Diagnosis present

## 2021-06-03 DIAGNOSIS — M25572 Pain in left ankle and joints of left foot: Secondary | ICD-10-CM

## 2021-06-03 DIAGNOSIS — M6281 Muscle weakness (generalized): Secondary | ICD-10-CM

## 2021-06-03 DIAGNOSIS — R2689 Other abnormalities of gait and mobility: Secondary | ICD-10-CM

## 2021-06-06 NOTE — Therapy (Signed)
?OUTPATIENT PHYSICAL THERAPY TREATMENT NOTE ? ? ?Patient Name: Jodi Keller ?MRN: [id number] ?DOB:04/18/1955, 66 y.o., female ?Today's Date: 06/07/2021 ? ?PCP: Biagio Borg, MD ?REFERRING PROVIDER:  Fayette Pho, PA ? ? PT End of Session - 06/07/21 1603   ? ? Visit Number 14   ? Number of Visits 19   ? Date for PT Re-Evaluation 07/06/21   ? Authorization Type CIG[employer] / MCR   ? PT Start Time 1615   ? PT Stop Time 1700   ? PT Time Calculation (min) 45 min   ? Activity Tolerance Patient tolerated treatment well   ? Behavior During Therapy [facility] for tasks assessed/performed   ? ?  ?  ? ?  ? ? ? ? ? ? ? ? ? ? ? ?Past Medical History:  ?Diagnosis Date  ? Anemia   ? Bilateral carpal tunnel syndrome 12/16/2010  ? 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 Generalized anxiety disorder 11/17/2014  ? GERD (gastroesophageal reflux disease)   ? Hyperlipidemia 02/25/2011  ? Hypertension   ? Morbid obesity ([place])   ? Uterine prolapse   ? ?Past Surgical History:  ?Procedure Laterality Date  ? BALLOON DILATION N/A 12/29/2019  ? Procedure: BALLOON DILATION;  Surgeon: Irene Shipper, MD;  Location: [facility];  Service: Endoscopy;  Laterality: N/A;  ? CESAREAN SECTION    ? COLONOSCOPY WITH PROPOFOL N/A 12/29/2019  ? Procedure: COLONOSCOPY WITH PROPOFOL;  Surgeon: Irene Shipper, MD;  Location: [facility];  Service: Endoscopy;  Laterality: N/A;  ? ESOPHAGOGASTRODUODENOSCOPY (EGD) WITH PROPOFOL N/A 12/29/2019  ? Procedure: ESOPHAGOGASTRODUODENOSCOPY (EGD) WITH PROPOFOL;  Surgeon: Irene Shipper, MD;  Location: [facility];  Service: Endoscopy;  Laterality: N/A;  ? EYE SURGERY    ? cataract surgery per left eye   ? FOOT SURGERY    ? ?Patient Active Problem List  ? Diagnosis Date Noted  ? CKD (chronic kidney disease) stage 3, GFR 30-59 ml/min (HCC) 12/14/2020  ? Esophageal dysphagia   ? Esophageal stricture   ? Stress incontinence 12/12/2019  ? Ganglion cyst of dorsum of right wrist 09/04/2018  ? Acne 09/04/2018  ? Cellulitis  02/01/2017  ? Biceps muscle tear 01/31/2017  ? Cough 01/27/2016  ? Wheezing 01/27/2016  ? Low back pain 01/27/2016  ? Left rotator cuff tear 09/17/2015  ? Left shoulder pain 09/03/2015  ? Hidradenitis 06/16/2015  ? Chest pain 06/16/2015  ? Generalized anxiety disorder 11/17/2014  ? Gait disorder 11/17/2014  ? Intertrigo 11/17/2014  ? Left knee pain 12/17/2013  ? Left otitis media 07/04/2013  ? Vertigo 06/26/2012  ? Allergic rhinitis, cause unspecified 06/26/2012  ? Eustachian tube dysfunction 06/26/2012  ? Polycythemia 12/20/2011  ? Peripheral edema 07/21/2011  ? Impaired glucose tolerance 07/21/2011  ? Hyperlipidemia 02/25/2011  ? Right leg pain 02/24/2011  ? Bilateral carpal tunnel syndrome 12/16/2010  ? 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 Colon cancer screening 12/10/2010  ? FATIGUE 10/30/2007  ? Morbid obesity ([place]) 11/13/2006  ? ANEMIA-IRON DEFICIENCY 11/13/2006  ? Essential hypertension 11/13/2006  ? GERD 11/13/2006  ? ? ?REFERRING DIAG: Primary osteoarthritis, right ankle and foot ? ?THERAPY DIAG:  ?Other abnormalities of gait and mobility ? ?Pain in right ankle and joints of right foot ? ?Pain in left ankle and joints of left foot ? ?Muscle weakness (generalized) ? ?PERTINENT HISTORY: Right ankle fusion 2022 ? ?PRECAUTIONS: Fall ? ?ONSET DATE: 03/31/2020 (DOS for right ankle fusion) ? ?SUBJECTIVE:  ?Patient reports she  is doing well, she feels she continues to improve and does like the pool therapy. ? ?PAIN:  ?Are you having pain? No (currently) ?NPRS scale: 0/10 (3/10 when going from sitting to standing) ?Pain location: Ankle ?Pain orientation: Bilateral (left > right) ?PAIN TYPE: Chronic ?Pain description: Intermittent, sore ?Aggravating factors: Walking ?Relieving factors: Rest ? ?PATIENT GOALS: Improve balance to avoid falls, walk without rollator, be able to get into pool for water aerobics ? ? ?OBJECTIVE:  ?PATIENT SURVEYS:  ?FOTO: 51% functional status - 05/11/2021 (44% functional  status - 04/11/2021; 44% at intake) ?  ?POSTURE: ?Patient with rounded shoulders, increased lumbar lordosis with forward trunk lean, knee valgus ?  ?LE AROM/PROM: ?Patient demonstrates gross deficits of bilateral ankle motion, not formally assessed this visit ?  ?LE MMT: ?  ?Unable to formally assess ankle strength, she does generally demonstrate gross weakness and poor control of ankle/foot ?  ?MMT Right ?03/16/2021 Left ?03/16/2021 Rt / Lt ?04/14/2021  ?Hip flexion '4 4 4 '$ / 4  ?Hip abduction '4 4 4 '$ / 4  ?Knee flexion 5 5   ?Knee extension 4+ 4+   ?  ?FUNCTIONAL TESTS:  ?5 times sit to stand: 11 with hands on thighs - 05/11/2021 (16 seconds with hands on thighs - 04/11/2021; 17 seconds at intake and patient required to use BUE support on armrests to stand) ? ?Timed up and go (TUG): 13 seconds using rollator - 05/11/2021 (17 seconds using rollator at intake) ? ?2 minute walk test: 337 ft using rollator - 05/11/2021 (300 feet using rollator - 04/14/2021) ?  ?GAIT: - reassessed 05/11/2021 ?Distance walked: 300 ft ?Assistive device utilized: Rollator ?Level of assistance: Modified independence ?Comments: patient tends to ambulate on lateral aspect of foot, minimal reliance of rollator with slight forward trunk lean, trendelenburg, minimal heel strike or toe off ?  ?  ?TODAY'S TREATMENT: ?[facility] Adult PT Treatment:                                                DATE: 06/07/2021 ?Therapeutic Exercise: ?NuStep L7 x 6 min with UE/LE while taking subjective ?Sit to stand 2 x 15 holding 10# at chest ?LAQ 5# 2 x 20 each ?Standing hip abduction with 5# 2 x 15 each ?Standing hip extension with 5# 2 x 15 each ?Standing alternating march with 5# 2 x 20 ?Gait training using bilat walking sticks, the using single walking stick on right hand ?Neuromuscular re-ed: (performed at [facility]) ?Romberg stance on Airex 3 x 60 sec ? ? ?[facility] Adult PT Treatment:                                                DATE: 05/11/2021 ?Therapeutic Exercise: ?NuStep L7 x 5  min with UE/LE while taking subjective ?SLR 2 x 10 each ?Bridge 2 x 10 ?LAQ 5# 2 x 15 each ?Sit to stand x 10 with UE support on thighs, x 10 without UE support ?2MWT for workload capacity ?Standing hip abduction 2 x 15 each ?Standing hip extension 2 x 15 ?Neuromuscular re-ed: (performed at [facility]) ?Romberg stance on Airex 3 x 60 sec ?Modified 1/2 tandem stance 2 x 60 sec each ? ?[facility] Adult PT Treatment:  DATE: 04/25/2021 ?Therapeutic Exercise: ?NuStep L7 x 5 min with UE/LE while taking subjective ?LAQ 4# 2 x 15 each ?Standing hamstring curl with 4# 2 x 15 each ?Standing alternating march with 4# 2 x 20 ?Sit to stand from chair with hands on thighs 2 x 10 ?Standing hip abduction with red at knee 2 x 10 each ?Standing hip extension red at knee 2 x 10 ?Standing row with FM 7# 2 x 15 ?Neuromuscular re-ed: (performed at [facility]) ?Romberg stance on Airex 3 x 60 sec ?Modified 1/2 tandem stance 2 x 60 sec each ? ?PATIENT EDUCATION:  ?Education details: HEP ?Person educated: Patient ?Education method: Explanation, Demonstration, Tactile cues, Verbal cues ?Education comprehension: verbalized understanding, returned demonstration, verbal cues required, tactile cues required, and needs further education ?  ?HOME EXERCISE PROGRAM: ?Access Code: [email or web address] ?  ?  ?ASSESSMENT: ?CLINICAL IMPRESSION: ?Patient tolerated therapy well with no adverse effects. Therapy focused on continued progression of LE strengthening and balance training. Also worked on Personnel officer using walking sticks this visit and patient was able to coordinate much better than using SPC. She continues to demonstrate high fear level but seems to be improving with her gait and mobility. No changes to HEP this visit. Patient would benefit from continued skilled PT to progress her strength and balance in order to improve walking and maximize functional ability. ? ?  ?OBJECTIVE IMPAIRMENTS Abnormal gait, decreased balance, decreased  endurance, difficulty walking, decreased ROM, decreased strength, impaired sensation, improper body mechanics, postural dysfunction, and pain.  ?  ?ACTIVITY LIMITATIONS cleaning, community activity, meal prep, occ

## 2021-06-07 ENCOUNTER — Ambulatory Visit: Payer: Managed Care, Other (non HMO) | Admitting: Physical Therapy

## 2021-06-07 ENCOUNTER — Encounter: Payer: Self-pay | Admitting: Physical Therapy

## 2021-06-07 ENCOUNTER — Other Ambulatory Visit: Payer: Self-pay

## 2021-06-07 DIAGNOSIS — M25572 Pain in left ankle and joints of left foot: Secondary | ICD-10-CM

## 2021-06-07 DIAGNOSIS — M25571 Pain in right ankle and joints of right foot: Secondary | ICD-10-CM

## 2021-06-07 DIAGNOSIS — M6281 Muscle weakness (generalized): Secondary | ICD-10-CM

## 2021-06-07 DIAGNOSIS — R2689 Other abnormalities of gait and mobility: Secondary | ICD-10-CM | POA: Diagnosis not present

## 2021-06-15 DIAGNOSIS — D45 Polycythemia vera: Secondary | ICD-10-CM | POA: Insufficient documentation

## 2021-06-21 ENCOUNTER — Other Ambulatory Visit: Payer: Self-pay | Admitting: Internal Medicine

## 2021-06-21 NOTE — Telephone Encounter (Signed)
Please refill as per office routine med refill policy (all routine meds to be refilled for 3 mo or monthly (per pt preference) up to one year from last visit, then month to month grace period for 3 mo, then further med refills will have to be denied) ? ?

## 2021-06-22 ENCOUNTER — Ambulatory Visit: Payer: Managed Care, Other (non HMO)

## 2021-06-22 DIAGNOSIS — M25572 Pain in left ankle and joints of left foot: Secondary | ICD-10-CM

## 2021-06-22 DIAGNOSIS — M6281 Muscle weakness (generalized): Secondary | ICD-10-CM

## 2021-06-22 DIAGNOSIS — R2689 Other abnormalities of gait and mobility: Secondary | ICD-10-CM | POA: Diagnosis not present

## 2021-06-22 DIAGNOSIS — M25571 Pain in right ankle and joints of right foot: Secondary | ICD-10-CM

## 2021-06-22 NOTE — Therapy (Signed)
OUTPATIENT PHYSICAL THERAPY TREATMENT NOTE   Patient Name: Jodi Keller MRN: [id number] DOB:September 16, 1955, 66 y.o., female Today's Date: 06/22/2021  PCP: Biagio Borg, MD REFERRING PROVIDER:  Fayette Pho, PA   PT End of Session - 06/22/21 1617     Visit Number 15    Number of Visits 19    Date for PT Re-Evaluation 07/06/21    Authorization Type [employer] / MCR    PT Start Time 1616    PT Stop Time 9798    PT Time Calculation (min) 39 min    Activity Tolerance Patient tolerated treatment well    Behavior During Therapy [facility] for tasks assessed/performed                       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   Generalized anxiety disorder 11/17/2014   GERD (gastroesophageal reflux disease)    Hyperlipidemia 02/25/2011   Hypertension    Morbid obesity ([place])    Uterine prolapse    Past Surgical History:  Procedure Laterality Date   BALLOON DILATION N/A 12/29/2019   Procedure: BALLOON DILATION;  Surgeon: Irene Shipper, MD;  Location: [facility];  Service: Endoscopy;  Laterality: N/A;   CESAREAN SECTION     COLONOSCOPY WITH PROPOFOL N/A 12/29/2019   Procedure: COLONOSCOPY WITH PROPOFOL;  Surgeon: Irene Shipper, MD;  Location: [facility];  Service: Endoscopy;  Laterality: N/A;   ESOPHAGOGASTRODUODENOSCOPY (EGD) WITH PROPOFOL N/A 12/29/2019   Procedure: ESOPHAGOGASTRODUODENOSCOPY (EGD) WITH PROPOFOL;  Surgeon: Irene Shipper, MD;  Location: [facility];  Service: Endoscopy;  Laterality: N/A;   EYE SURGERY     cataract surgery per left eye    FOOT SURGERY     Patient Active Problem List   Diagnosis Date Noted   CKD (chronic kidney disease) stage 3, GFR 30-59 ml/min (HCC) 12/14/2020   Esophageal dysphagia    Esophageal stricture    Stress incontinence 12/12/2019   Ganglion cyst of dorsum of right wrist 09/04/2018   Acne 09/04/2018   Cellulitis  02/01/2017   Biceps muscle tear 01/31/2017   Cough 01/27/2016   Wheezing 01/27/2016   Low back pain 01/27/2016   Left rotator cuff tear 09/17/2015   Left shoulder pain 09/03/2015   Hidradenitis 06/16/2015   Chest pain 06/16/2015   Generalized anxiety disorder 11/17/2014   Gait disorder 11/17/2014   Intertrigo 11/17/2014   Left knee pain 12/17/2013   Left otitis media 07/04/2013   Vertigo 06/26/2012   Allergic rhinitis, cause unspecified 06/26/2012   Eustachian tube dysfunction 06/26/2012   Polycythemia 12/20/2011   Peripheral edema 07/21/2011   Impaired glucose tolerance 07/21/2011   Hyperlipidemia 02/25/2011   Right leg pain 02/24/2011   Bilateral carpal tunnel syndrome 12/16/2010   Degenerative joint disease of ankle, 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   Colon cancer screening 12/10/2010   FATIGUE 10/30/2007   Morbid obesity ([place]) 11/13/2006   ANEMIA-IRON DEFICIENCY 11/13/2006   Essential hypertension 11/13/2006   GERD 11/13/2006    REFERRING DIAG: Primary osteoarthritis, right ankle and foot  THERAPY DIAG:  Other abnormalities of gait and mobility  Pain in right ankle and joints of right foot  Pain in left ankle and joints of left foot  Muscle weakness (generalized)  PERTINENT HISTORY: Right ankle fusion 2022  PRECAUTIONS: Fall  ONSET DATE: 03/31/2020 (DOS for right ankle fusion)  SUBJECTIVE: Patient reports she  is doing well and isn't having any pain today.  PAIN:  Are you having pain? No (currently) NPRS scale: 0/10 (3/10 when going from sitting to standing) Pain location: Ankle Pain orientation: Bilateral (left > right) PAIN TYPE: Chronic Pain description: Intermittent, sore Aggravating factors: Walking Relieving factors: Rest  PATIENT GOALS: Improve balance to avoid falls, walk without rollator, be able to get into pool for water aerobics   OBJECTIVE:  PATIENT SURVEYS:  FOTO: 51% functional status - 05/11/2021 (44% functional status - 04/11/2021; 44% at intake)    POSTURE: Patient with rounded shoulders, increased lumbar lordosis with forward trunk lean, knee valgus   LE AROM/PROM: Patient demonstrates gross deficits of bilateral ankle motion, not formally assessed this visit   LE MMT:   Unable to formally assess ankle strength, she does generally demonstrate gross weakness and poor control of ankle/foot   MMT Right 03/16/2021 Left 03/16/2021 Rt / Lt 04/14/2021  Hip flexion _0 / 4  Hip abduction _1 / 4  Knee flexion 5 5   Knee extension 4+ 4+     FUNCTIONAL TESTS:  5 times sit to stand: 11 with hands on thighs - 05/11/2021 (16 seconds with hands on thighs - 04/11/2021; 17 seconds at intake and patient required to use BUE support on armrests to stand)  Timed up and go (TUG): 13 seconds using rollator - 05/11/2021 (17 seconds using rollator at intake)  2 minute walk test: 337 ft using rollator - 05/11/2021 (300 feet using rollator - 04/14/2021)   GAIT: - reassessed 05/11/2021 Distance walked: 300 ft Assistive device utilized: Rollator Level of assistance: Modified independence Comments: patient tends to ambulate on lateral aspect of foot, minimal reliance of rollator with slight forward trunk lean, trendelenburg, minimal heel strike or toe off     TODAY'S TREATMENT: [facility] Adult PT Treatment:                                                DATE: 06/22/2021 Therapeutic Exercise: NuStep L7 x 6 min with UE/LE while taking subjective LAQ 5# 2 x 20 each Standing hip abduction with 5# 2 x 15 each Standing hip extension with 5# 2 x 15 each Standing alternating march with 5# 2 x 20 Gait training using bilat walking sticks, cues to look up and scan environment, and lift knees 5xSTS 12 seconds without use of UE on thighs Neuromuscular re-ed: (performed at FM) Romberg stance on Airex 3 x 60 sec Marching in Airex x20 BIL   [facility] Adult PT Treatment:                                                DATE: 06/07/2021 Therapeutic Exercise: NuStep L7 x 6  min with UE/LE while taking subjective Sit to stand 2 x 15 holding 10# at chest LAQ 5# 2 x 20 each Standing hip abduction with 5# 2 x 15 each Standing hip extension with 5# 2 x 15 each Standing alternating march with 5# 2 x 20 Gait training using bilat walking sticks, the using single walking stick on right hand Neuromuscular re-ed: (performed at FM) Romberg stance on Airex 3 x 60 sec   [facility] Adult PT Treatment:  DATE: 05/11/2021 Therapeutic Exercise: NuStep L7 x 5 min with UE/LE while taking subjective SLR 2 x 10 each Bridge 2 x 10 LAQ 5# 2 x 15 each Sit to stand x 10 with UE support on thighs, x 10 without UE support 2MWT for workload capacity Standing hip abduction 2 x 15 each Standing hip extension 2 x 15 Neuromuscular re-ed: (performed at FM) Romberg stance on Airex 3 x 60 sec Modified 1/2 tandem stance 2 x 60 sec each  [facility] Adult PT Treatment:                                                DATE: 04/25/2021 Therapeutic Exercise: NuStep L7 x 5 min with UE/LE while taking subjective LAQ 4# 2 x 15 each Standing hamstring curl with 4# 2 x 15 each Standing alternating march with 4# 2 x 20 Sit to stand from chair with hands on thighs 2 x 10 Standing hip abduction with red at knee 2 x 10 each Standing hip extension red at knee 2 x 10 Standing row with FM 7# 2 x 15 Neuromuscular re-ed: (performed at FM) Romberg stance on Airex 3 x 60 sec Modified 1/2 tandem stance 2 x 60 sec each  PATIENT EDUCATION:  Education details: HEP Person educated: Patient Education method: Consulting civil engineer, Demonstration, Tactile cues, Verbal cues Education comprehension: verbalized understanding, returned demonstration, verbal cues required, tactile cues required, and needs further education   HOME EXERCISE PROGRAM: Access Code: [email or web address]     ASSESSMENT: CLINICAL IMPRESSION: Patient presents to PT with no current pain. Session today focused on LE  strengthening and balance training as well as gait training using bilateral and unilateral walking sticks to improve patient functional ability to ambulate independently without relying too heavily on UE on RW. She does better ambulating when she is near walls/furniture and has an end point to focus on. Towards the end of the session she was taking bigger, more confident steps with the walking sticks. Patient was able to tolerate all prescribed exercises with no adverse effects. Patient continues to benefit from skilled PT services and should be progressed as able to improve functional independence.    OBJECTIVE IMPAIRMENTS Abnormal gait, decreased balance, decreased endurance, difficulty walking, decreased ROM, decreased strength, impaired sensation, improper body mechanics, postural dysfunction, and pain.    ACTIVITY LIMITATIONS cleaning, community activity, meal prep, occupation, laundry, yard work, and shopping.    PERSONAL FACTORS Fitness, Past/current experiences, Time since onset of injury/illness/exacerbation, and 3+ comorbidities: BMI, previous surgical history, HTN, history of anxiety  are also affecting patient's functional outcome.      GOALS: Goals reviewed with patient? Yes   SHORT TERM GOALS:   STG Name Target Date Goal status  1 Patient will be I with initial HEP in order to progress with therapy. Baseline: HEP provided at evaluation 04/14/2021: independent 04/13/2021 MET  2 PT will review FOTO with patient by 3rd visit in order to understand expected progress and outcome with therapy. Baseline: FOTO assessed at evaluation 04/14/2021: reviewed and reassessed 04/13/2021 MET  3 Patient will be able to perform a sit to stand without requiring BUE for assist from armrests to indicate progression with LE strength Baseline: patient requires BUE pushing on armrest in order to stand 04/14/2021: patient able to perform sit to stand with hands on thighs 04/13/2021 MET    LONG TERM  GOALS:     LTG Name Target Date Goal status  1 Patient will be I with final HEP to maintain progress from PT. Baseline: HEP provided at evaluation 05/11/2021: 07/06/2021 ONGOING  2 Patient will report >/= 70% confidence with walking up/down a ramp via FOTO assessment in order to indicate improve walking and functional ability Baseline: 10% confidence 05/11/2021: 60% confidence 07/06/2021 MODIFIED  3 Patient will demonstrate 5xSTS in </= 12 seconds to indicate improved strength and reduction of fall risk Baseline: 17 seconds using BUE support on armrests 05/11/2021: 11 seconds but with hands on thighs  06/22/2021: 12 seconds without UE use 07/06/2021 MET  4 Patient will perform TUG in </= 13 seconds to indicate improved mobility and reduction of fall risk Baseline: 17 seconds using rollator 05/11/2021: 13 seconds using rollator 07/06/2021 MET  5 Patient will exhibit ability to ambulate >/= 546 ft in 2MWT in order to improve community access Baseline: 260 ft using rollator 05/11/2021: 337 ft using rollator 07/06/2021 MODIFIED      PLAN: PT FREQUENCY: 1x/week   PT DURATION: 8 weeks   PLANNED INTERVENTIONS: Therapeutic exercises, Therapeutic activity, Neuro Muscular re-education, Balance training, Gait training, Patient/Family education, Joint mobilization, Stair training, Aquatic Therapy, Dry Needling, and Manual therapy   PLAN FOR NEXT SESSION:  Review HEP and progress PRN, focus on generalized LE strengthening and balance training, pt requesting high/low table next session, possibly incorporate arm exercises while      Evelene Croon, PTA 06/22/21 4:57 PM

## 2021-06-29 ENCOUNTER — Other Ambulatory Visit: Payer: Self-pay

## 2021-06-29 ENCOUNTER — Encounter: Payer: Self-pay | Admitting: Physical Therapy

## 2021-06-29 ENCOUNTER — Ambulatory Visit: Payer: Managed Care, Other (non HMO) | Attending: Surgical | Admitting: Physical Therapy

## 2021-06-29 DIAGNOSIS — M25571 Pain in right ankle and joints of right foot: Secondary | ICD-10-CM

## 2021-06-29 DIAGNOSIS — R2689 Other abnormalities of gait and mobility: Secondary | ICD-10-CM

## 2021-06-29 DIAGNOSIS — M25572 Pain in left ankle and joints of left foot: Secondary | ICD-10-CM

## 2021-06-29 DIAGNOSIS — M6281 Muscle weakness (generalized): Secondary | ICD-10-CM | POA: Diagnosis present

## 2021-06-29 NOTE — Therapy (Signed)
OUTPATIENT PHYSICAL THERAPY TREATMENT NOTE   Patient Name: Jodi Keller MRN: [id number] DOB:07-20-1955, 66 y.o., female Today's Date: 06/29/2021  PCP: Biagio Borg, MD REFERRING PROVIDER:  Fayette Pho, PA   PT End of Session - 06/29/21 1619     Visit Number 16    Number of Visits 21    Date for PT Re-Evaluation 08/03/21    Authorization Type [employer] / MCR    PT Start Time 1615    PT Stop Time 1700    PT Time Calculation (min) 45 min    Activity Tolerance Patient tolerated treatment well    Behavior During Therapy [facility] for tasks assessed/performed                        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   Generalized anxiety disorder 11/17/2014   GERD (gastroesophageal reflux disease)    Hyperlipidemia 02/25/2011   Hypertension    Morbid obesity ([place])    Uterine prolapse    Past Surgical History:  Procedure Laterality Date   BALLOON DILATION N/A 12/29/2019   Procedure: BALLOON DILATION;  Surgeon: Irene Shipper, MD;  Location: [facility];  Service: Endoscopy;  Laterality: N/A;   CESAREAN SECTION     COLONOSCOPY WITH PROPOFOL N/A 12/29/2019   Procedure: COLONOSCOPY WITH PROPOFOL;  Surgeon: Irene Shipper, MD;  Location: [facility];  Service: Endoscopy;  Laterality: N/A;   ESOPHAGOGASTRODUODENOSCOPY (EGD) WITH PROPOFOL N/A 12/29/2019   Procedure: ESOPHAGOGASTRODUODENOSCOPY (EGD) WITH PROPOFOL;  Surgeon: Irene Shipper, MD;  Location: [facility];  Service: Endoscopy;  Laterality: N/A;   EYE SURGERY     cataract surgery per left eye    FOOT SURGERY     Patient Active Problem List   Diagnosis Date Noted   CKD (chronic kidney disease) stage 3, GFR 30-59 ml/min (HCC) 12/14/2020   Esophageal dysphagia    Esophageal stricture    Stress incontinence 12/12/2019   Ganglion cyst of dorsum of right wrist 09/04/2018   Acne 09/04/2018   Cellulitis  02/01/2017   Biceps muscle tear 01/31/2017   Cough 01/27/2016   Wheezing 01/27/2016   Low back pain 01/27/2016   Left rotator cuff tear 09/17/2015   Left shoulder pain 09/03/2015   Hidradenitis 06/16/2015   Chest pain 06/16/2015   Generalized anxiety disorder 11/17/2014   Gait disorder 11/17/2014   Intertrigo 11/17/2014   Left knee pain 12/17/2013   Left otitis media 07/04/2013   Vertigo 06/26/2012   Allergic rhinitis, cause unspecified 06/26/2012   Eustachian tube dysfunction 06/26/2012   Polycythemia 12/20/2011   Peripheral edema 07/21/2011   Impaired glucose tolerance 07/21/2011   Hyperlipidemia 02/25/2011   Right leg pain 02/24/2011   Bilateral carpal tunnel syndrome 12/16/2010   Degenerative joint disease of ankle, 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   Colon cancer screening 12/10/2010   FATIGUE 10/30/2007   Morbid obesity ([place]) 11/13/2006   ANEMIA-IRON DEFICIENCY 11/13/2006   Essential hypertension 11/13/2006   GERD 11/13/2006    REFERRING DIAG: Primary osteoarthritis, right ankle and foot  THERAPY DIAG:  Other abnormalities of gait and mobility  Pain in right ankle and joints of right foot  Pain in left ankle and joints of left foot  Muscle weakness (generalized)  PERTINENT HISTORY: Right ankle fusion 2022  PRECAUTIONS: Fall  ONSET DATE: 03/31/2020 (DOS for right ankle fusion)  SUBJECTIVE: Patient reports  she is doing well and isn't having any pain today.  PAIN:  Are you having pain? No (currently) NPRS scale: 0/10 (3/10 when going from sitting to standing) Pain location: Ankle Pain orientation: Bilateral (left > right) PAIN TYPE: Chronic Pain description: Intermittent, sore Aggravating factors: Walking Relieving factors: Rest  PATIENT GOALS: Improve balance to avoid falls, walk without rollator, be able to get into pool for water aerobics   OBJECTIVE:  PATIENT SURVEYS:  FOTO: 55% functional status - 06/29/2021  (51% functional status - 05/11/2021, 44% functional  status - 04/11/2021; 44% at intake)   POSTURE: Patient with rounded shoulders, increased lumbar lordosis with forward trunk lean, knee valgus   LE AROM/PROM: Patient demonstrates gross deficits of bilateral ankle motion, not formally assessed this visit   LE MMT:   Unable to formally assess ankle strength, she does generally demonstrate gross weakness and poor control of ankle/foot   MMT Right 03/16/2021 Left 03/16/2021 Rt / Lt 04/14/2021  Hip flexion '4 4 4 ' / 4  Hip abduction '4 4 4 ' / 4  Knee flexion 5 5   Knee extension 4+ 4+     FUNCTIONAL TESTS:  5 times sit to stand: 11 without hands - 06/29/2021 (11 with hands on thighs - 05/11/2021, 16 seconds with hands on thighs - 04/11/2021; 17 seconds at intake and patient required to use BUE support on armrests to stand)  Timed up and go (TUG): 13 seconds using rollator - 05/11/2021 (17 seconds using rollator at intake)  2 minute walk test: 323 ft using rollator - 06/29/2021  (337 ft using rollator - 05/11/2021, 300 feet using rollator - 04/14/2021)   GAIT: - reassessed 06/29/2021 Distance walked: 323 ft Assistive device utilized: Rollator Level of assistance: Modified independence Comments: patient tends to ambulate on lateral aspect of foot, minimal reliance of rollator with slight forward trunk lean, trendelenburg, minimal heel strike or toe off     TODAY'S TREATMENT: [facility] Adult PT Treatment:                                                DATE: 06/29/2021 Therapeutic Exercise: NuStep L7 x 6 min with UE/LE while taking subjective LAQ 5# 2 x 20 each Standing hip abduction with 5# 2 x 20 each Standing hip extension with 5# 2 x 20 each Standing alternating march with 5# 2 x 20 Gait training using bilat walking sticks, cues to look up and scan environment, and lift knees, increased step length Neuromuscular re-ed: (performed at FM) Romberg stance on Airex 3 x 60 sec   [facility] Adult PT Treatment:                                                 DATE: 06/22/2021 Therapeutic Exercise: NuStep L7 x 6 min with UE/LE while taking subjective LAQ 5# 2 x 20 each Standing hip abduction with 5# 2 x 15 each Standing hip extension with 5# 2 x 15 each Standing alternating march with 5# 2 x 20 Gait training using bilat walking sticks, cues to look up and scan environment, and lift knees 5xSTS 12 seconds without use of UE on thighs Neuromuscular re-ed: (performed at FM) Romberg stance on Airex 3 x 60 sec  Marching in Airex x20 BIL  [facility] Adult PT Treatment:                                                DATE: 06/07/2021 Therapeutic Exercise: NuStep L7 x 6 min with UE/LE while taking subjective Sit to stand 2 x 15 holding 10# at chest LAQ 5# 2 x 20 each Standing hip abduction with 5# 2 x 15 each Standing hip extension with 5# 2 x 15 each Standing alternating march with 5# 2 x 20 Gait training using bilat walking sticks, the using single walking stick on right hand Neuromuscular re-ed: (performed at FM) Romberg stance on Airex 3 x 60 sec  [facility] Adult PT Treatment:                                                DATE: 05/11/2021 Therapeutic Exercise: NuStep L7 x 5 min with UE/LE while taking subjective SLR 2 x 10 each Bridge 2 x 10 LAQ 5# 2 x 15 each Sit to stand x 10 with UE support on thighs, x 10 without UE support 2MWT for workload capacity Standing hip abduction 2 x 15 each Standing hip extension 2 x 15 Neuromuscular re-ed: (performed at FM) Romberg stance on Airex 3 x 60 sec Modified 1/2 tandem stance 2 x 60 sec each  [facility] Adult PT Treatment:                                                DATE: 04/25/2021 Therapeutic Exercise: NuStep L7 x 5 min with UE/LE while taking subjective LAQ 4# 2 x 15 each Standing hamstring curl with 4# 2 x 15 each Standing alternating march with 4# 2 x 20 Sit to stand from chair with hands on thighs 2 x 10 Standing hip abduction with red at knee 2 x 10 each Standing hip extension red at knee 2 x  10 Standing row with FM 7# 2 x 15 Neuromuscular re-ed: (performed at FM) Romberg stance on Airex 3 x 60 sec Modified 1/2 tandem stance 2 x 60 sec each  PATIENT EDUCATION:  Education details: POC update, HEP Person educated: Patient Education method: Explanation, Demonstration, Tactile cues, Verbal cues Education comprehension: verbalized understanding, returned demonstration, verbal cues required, tactile cues required, and needs further education   HOME EXERCISE PROGRAM: Access Code: [email or web address]     ASSESSMENT: CLINICAL IMPRESSION: Patient presents to PT with no current pain. She continues to make progress in therapy, demonstrating improved mobility and strength, and reporting improved functional ability of FOTO. Her walking capacity continues to be limited showing a deficit in walking distance in 2MWT compared to age matched norms likely leading to her reduced community mobility. Therapy continues to focus on progression of her strength, balance, and capacity for activity to reduce limitations with community access and reduce fall risk. She does exhibit improvement with ambulation using walking stick compared to [facility], but continues to exhibit increased fear and decreased step length when she is not near any objects. Patient continues to benefit from skilled PT services and her PT POC will be extended  for 5 more weeks at frequency of 1x/week.     OBJECTIVE IMPAIRMENTS Abnormal gait, decreased balance, decreased endurance, difficulty walking, decreased ROM, decreased strength, impaired sensation, improper body mechanics, postural dysfunction, and pain.    ACTIVITY LIMITATIONS cleaning, community activity, meal prep, occupation, laundry, yard work, and shopping.    PERSONAL FACTORS Fitness, Past/current experiences, Time since onset of injury/illness/exacerbation, and 3+ comorbidities: BMI, previous surgical history, HTN, history of anxiety  are also affecting patient's functional outcome.       GOALS: Goals reviewed with patient? Yes   SHORT TERM GOALS:   STG Name Target Date Goal status  1 Patient will be I with initial HEP in order to progress with therapy. Baseline: HEP provided at evaluation 04/14/2021: independent 04/13/2021 MET  2 PT will review FOTO with patient by 3rd visit in order to understand expected progress and outcome with therapy. Baseline: FOTO assessed at evaluation 04/14/2021: reviewed and reassessed 04/13/2021 MET  3 Patient will be able to perform a sit to stand without requiring BUE for assist from armrests to indicate progression with LE strength Baseline: patient requires BUE pushing on armrest in order to stand 04/14/2021: patient able to perform sit to stand with hands on thighs 04/13/2021 MET    LONG TERM GOALS:    LTG Name Target Date Goal status  1 Patient will be I with final HEP to maintain progress from PT. Baseline: HEP provided at evaluation 05/11/2021: 06/29/2021: progressing HEP 08/03/2021 ONGOING  2 Patient will report >/= 70% confidence with walking up/down a ramp via FOTO assessment in order to indicate improve walking and functional ability Baseline: 10% confidence 05/11/2021: 60% confidence 06/29/2021: 70%  07/06/2021 MET  3 Patient will demonstrate 5xSTS in </= 12 seconds to indicate improved strength and reduction of fall risk Baseline: 17 seconds using BUE support on armrests 05/11/2021: 11 seconds but with hands on thighs  06/22/2021: 12 seconds without UE use 06/29/2021: 11 seconds without UE use 07/06/2021 MET  4 Patient will perform TUG in </= 13 seconds to indicate improved mobility and reduction of fall risk Baseline: 17 seconds using rollator 05/11/2021: 13 seconds using rollator 07/06/2021 MET  5 Patient will exhibit ability to ambulate >/= 546 ft in 2MWT in order to improve community access Baseline: 260 ft using rollator 05/11/2021: 337 ft using rollator 06/29/2021: 323 ft using rollator 08/03/2021 ONGOING      PLAN: PT FREQUENCY:  1x/week   PT DURATION: 8 weeks   PLANNED INTERVENTIONS: Therapeutic exercises, Therapeutic activity, Neuro Muscular re-education, Balance training, Gait training, Patient/Family education, Joint mobilization, Stair training, Aquatic Therapy, Dry Needling, and Manual therapy   PLAN FOR NEXT SESSION:  Review HEP and progress PRN, focus on generalized LE strengthening and balance training, pt requesting high/low table next session, possibly incorporate arm exercises while     Hilda Blades, PT, DPT, LAT, ATC 06/29/21  5:00 PM Phone: [phone] Fax: [phone]

## 2021-07-05 NOTE — Therapy (Signed)
OUTPATIENT PHYSICAL THERAPY TREATMENT NOTE   Patient Name: Jodi Keller MRN: [id number] DOB:11/07/1955, 66 y.o., female Today's Date: 07/06/2021  PCP: John, James W, MD REFERRING PROVIDER:  Huynh, Tam, PA   PT End of Session - 07/06/21 1618     Visit Number 17    Number of Visits 21    Date for PT Re-Evaluation 08/03/21    Authorization Type [employer] / MCR    PT Start Time 1615    PT Stop Time 1700    PT Time Calculation (min) 45 min    Activity Tolerance Patient tolerated treatment well    Behavior During Therapy WFL for tasks assessed/performed                         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   Generalized anxiety disorder 11/17/2014   GERD (gastroesophageal reflux disease)    Hyperlipidemia 02/25/2011   Hypertension    Morbid obesity (HCC)    Uterine prolapse    Past Surgical History:  Procedure Laterality Date   BALLOON DILATION N/A 12/29/2019   Procedure: BALLOON DILATION;  Surgeon: Perry, John N, MD;  Location: [facility];  Service: Endoscopy;  Laterality: N/A;   CESAREAN SECTION     COLONOSCOPY WITH PROPOFOL N/A 12/29/2019   Procedure: COLONOSCOPY WITH PROPOFOL;  Surgeon: Perry, John N, MD;  Location: [facility];  Service: Endoscopy;  Laterality: N/A;   ESOPHAGOGASTRODUODENOSCOPY (EGD) WITH PROPOFOL N/A 12/29/2019   Procedure: ESOPHAGOGASTRODUODENOSCOPY (EGD) WITH PROPOFOL;  Surgeon: Perry, John N, MD;  Location: [facility];  Service: Endoscopy;  Laterality: N/A;   EYE SURGERY     cataract surgery per left eye    FOOT SURGERY     Patient Active Problem List   Diagnosis Date Noted   CKD (chronic kidney disease) stage 3, GFR 30-59 ml/min (HCC) 12/14/2020   Esophageal dysphagia    Esophageal stricture    Stress incontinence 12/12/2019   Ganglion cyst of dorsum of right wrist 09/04/2018   Acne 09/04/2018   Cellulitis  02/01/2017   Biceps muscle tear 01/31/2017   Cough 01/27/2016   Wheezing 01/27/2016   Low back pain 01/27/2016   Left rotator cuff tear 09/17/2015   Left shoulder pain 09/03/2015   Hidradenitis 06/16/2015   Chest pain 06/16/2015   Generalized anxiety disorder 11/17/2014   Gait disorder 11/17/2014   Intertrigo 11/17/2014   Left knee pain 12/17/2013   Left otitis media 07/04/2013   Vertigo 06/26/2012   Allergic rhinitis, cause unspecified 06/26/2012   Eustachian tube dysfunction 06/26/2012   Polycythemia 12/20/2011   Peripheral edema 07/21/2011   Impaired glucose tolerance 07/21/2011   Hyperlipidemia 02/25/2011   Right leg pain 02/24/2011   Bilateral carpal tunnel syndrome 12/16/2010   Degenerative joint disease of ankle, 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 She denies any pain, she does note her feet feel numb.  PAIN:  Are you having pain? No (currently) NPRS scale: 0/10 (3/10 when going from sitting to standing) Pain location: Ankle Pain orientation: Bilateral (left > right) PAIN TYPE: Chronic Pain description: Intermittent, sore Aggravating factors: Walking Relieving factors: Rest  PATIENT GOALS: Improve balance to avoid falls, walk without rollator, be able to get into pool for water aerobics   OBJECTIVE:  PATIENT SURVEYS:  FOTO: 55% functional status - 06/29/2021  (51% functional status -  05/11/2021, 44% functional status - 04/11/2021; 44% at intake)   POSTURE: Patient with rounded shoulders, increased lumbar lordosis with forward trunk lean, knee valgus   LE AROM/PROM: Patient demonstrates gross deficits of bilateral ankle motion, not formally assessed this visit   LE MMT:  Unable to formally assess ankle strength, she does generally demonstrate gross weakness and poor control of ankle/foot   MMT Right 03/16/2021 Left 03/16/2021 Rt / Lt 04/14/2021  Hip flexion 4 4 4 / 4  Hip abduction 4 4 4 / 4  Knee flexion 5 5   Knee extension 4+ 4+     FUNCTIONAL TESTS:  5 times sit to stand: 11 without hands - 06/29/2021 (11 with hands on thighs - 05/11/2021, 16 seconds with hands on thighs - 04/11/2021; 17 seconds at intake and patient required to use BUE support on armrests to stand)  Timed up and go (TUG): 13 seconds using rollator - 05/11/2021 (17 seconds using rollator at intake)  2 minute walk test: 323 ft using rollator - 06/29/2021  (337 ft using rollator - 05/11/2021, 300 feet using rollator - 04/14/2021)   GAIT: - reassessed 06/29/2021 Distance walked: 323 ft Assistive device utilized: Rollator Level of assistance: Modified independence Comments: patient tends to ambulate on lateral aspect of foot, minimal reliance of rollator with slight forward trunk lean, trendelenburg, minimal heel strike or toe off     TODAY'S TREATMENT: [facility] Adult PT Treatment:                                                DATE: 07/06/2021 Therapeutic Exercise: NuStep L7 x 6 min with UE/LE while taking subjective Standing hip abduction 2 x 20 each Standing hip extension 2 x 20 each Standing alternating march 2 x 20 Knee extension machine 15# 2 x 20 Neuromuscular re-ed: (performed at [facility]) Romberg stance on Airex 3 x 60 sec Side stepping in // bars 2 x 2 lengths Backward walking in // bars 2 x 2 lengths Rockerboard fwd/bwd and lateral taps 2 x 20 each Gait Training: Walking in // bars without UE  support Walking with single walking stick in right hand   [facility] Adult PT Treatment:                                                DATE: 06/29/2021 Therapeutic Exercise: NuStep L7 x 6 min with UE/LE while taking subjective LAQ 5# 2 x 20 each Standing hip abduction with 5# 2 x 20 each Standing hip extension with 5# 2 x 20 each Standing alternating march with 5# 2 x 20 Gait training using bilat walking sticks, cues to look up and scan   environment, and lift knees, increased step length Neuromuscular re-ed: (performed at [facility]) Romberg stance on Airex 3 x 60 sec  [facility] Adult PT Treatment:                                                DATE: 06/22/2021 Therapeutic Exercise: NuStep L7 x 6 min with UE/LE while taking subjective LAQ 5# 2 x 20 each Standing hip abduction with 5# 2 x 15 each Standing hip extension with 5# 2 x 15 each Standing alternating march with 5# 2 x 20 Gait training using bilat walking sticks, cues to look up and scan environment, and lift knees 5xSTS 12 seconds without use of UE on thighs Neuromuscular re-ed: (performed at [facility]) Romberg stance on Airex 3 x 60 sec Marching in Airex x20 BIL  [facility] Adult PT Treatment:                                                DATE: 06/07/2021 Therapeutic Exercise: NuStep L7 x 6 min with UE/LE while taking subjective Sit to stand 2 x 15 holding 10# at chest LAQ 5# 2 x 20 each Standing hip abduction with 5# 2 x 15 each Standing hip extension with 5# 2 x 15 each Standing alternating march with 5# 2 x 20 Gait training using bilat walking sticks, the using single walking stick on right hand Neuromuscular re-ed: (performed at [facility]) Romberg stance on Airex 3 x 60 sec  PATIENT EDUCATION:  Education details: HEP Person educated: Patient Education method: Explanation, Demonstration, Tactile cues, Verbal cues Education comprehension: verbalized understanding, returned demonstration, verbal cues required, tactile cues required, and needs further  education   HOME EXERCISE PROGRAM: Access Code: [email or web address]     ASSESSMENT: CLINICAL IMPRESSION: Patient tolerated therapy well with no adverse effects. Incorporated more balance and gait training this visit utilizing // bars for support. Patient demonstrates much improved gait without AD when she is in the // bars or has objects to hold on to, but when in an open area she continues to revert back to short step length and report of fear. Therapy continues to progress strengthening exercises with good tolerance. No changes to HEP this visit. Patient would benefit from continued skilled PT to progress her mobility and strength in order to reduce pain and improve walking to maximize functional ability.    OBJECTIVE IMPAIRMENTS Abnormal gait, decreased balance, decreased endurance, difficulty walking, decreased ROM, decreased strength, impaired sensation, improper body mechanics, postural dysfunction, and pain.    ACTIVITY LIMITATIONS cleaning, community activity, meal prep, occupation, laundry, yard work, and shopping.    PERSONAL FACTORS Fitness, Past/current experiences, Time since onset of injury/illness/exacerbation, and 3+ comorbidities: BMI, previous surgical history, HTN, history of anxiety  are also affecting patient's functional outcome.      GOALS: Goals reviewed with patient? Yes   SHORT TERM GOALS:   STG Name Target Date Goal status  1 Patient will be I with initial HEP in order to progress with therapy. Baseline: HEP provided at evaluation 04/14/2021: independent 04/13/2021 MET  2 PT will review FOTO with patient by 3rd visit in order to understand expected progress and outcome with therapy. Baseline: FOTO assessed at evaluation 04/14/2021: reviewed and reassessed 04/13/2021 MET    3 Patient will be able to perform a sit to stand without requiring BUE for assist from armrests to indicate progression with LE strength Baseline: patient requires BUE pushing on armrest in order to  stand 04/14/2021: patient able to perform sit to stand with hands on thighs 04/13/2021 MET    LONG TERM GOALS:    LTG Name Target Date Goal status  1 Patient will be I with final HEP to maintain progress from PT. Baseline: HEP provided at evaluation 05/11/2021: 06/29/2021: progressing HEP 08/03/2021 ONGOING  2 Patient will report >/= 70% confidence with walking up/down a ramp via FOTO assessment in order to indicate improve walking and functional ability Baseline: 10% confidence 05/11/2021: 60% confidence 06/29/2021: 70%  07/06/2021 MET  3 Patient will demonstrate 5xSTS in </= 12 seconds to indicate improved strength and reduction of fall risk Baseline: 17 seconds using BUE support on armrests 05/11/2021: 11 seconds but with hands on thighs  06/22/2021: 12 seconds without UE use 06/29/2021: 11 seconds without UE use 07/06/2021 MET  4 Patient will perform TUG in </= 13 seconds to indicate improved mobility and reduction of fall risk Baseline: 17 seconds using rollator 05/11/2021: 13 seconds using rollator 07/06/2021 MET  5 Patient will exhibit ability to ambulate >/= 546 ft in 2MWT in order to improve community access Baseline: 260 ft using rollator 05/11/2021: 337 ft using rollator 06/29/2021: 323 ft using rollator 08/03/2021 ONGOING      PLAN: PT FREQUENCY: 1x/week   PT DURATION: 8 weeks   PLANNED INTERVENTIONS: Therapeutic exercises, Therapeutic activity, Neuro Muscular re-education, Balance training, Gait training, Patient/Family education, Joint mobilization, Stair training, Aquatic Therapy, Dry Needling, and Manual therapy   PLAN FOR NEXT SESSION:  Review HEP and progress PRN, focus on generalized LE strengthening and balance training, pt requesting high/low table next session, possibly incorporate arm exercises while     Hilda Blades, PT, DPT, LAT, ATC 07/06/21  5:01 PM Phone: [phone] Fax: [phone]

## 2021-07-06 ENCOUNTER — Encounter: Payer: Self-pay | Admitting: Physical Therapy

## 2021-07-06 ENCOUNTER — Ambulatory Visit: Payer: Managed Care, Other (non HMO) | Admitting: Physical Therapy

## 2021-07-06 ENCOUNTER — Other Ambulatory Visit: Payer: Self-pay

## 2021-07-06 DIAGNOSIS — M25571 Pain in right ankle and joints of right foot: Secondary | ICD-10-CM

## 2021-07-06 DIAGNOSIS — R2689 Other abnormalities of gait and mobility: Secondary | ICD-10-CM | POA: Diagnosis not present

## 2021-07-06 DIAGNOSIS — M6281 Muscle weakness (generalized): Secondary | ICD-10-CM

## 2021-07-06 DIAGNOSIS — M25572 Pain in left ankle and joints of left foot: Secondary | ICD-10-CM

## 2021-07-08 ENCOUNTER — Ambulatory Visit: Payer: Managed Care, Other (non HMO)

## 2021-07-08 DIAGNOSIS — M25572 Pain in left ankle and joints of left foot: Secondary | ICD-10-CM

## 2021-07-08 DIAGNOSIS — R2689 Other abnormalities of gait and mobility: Secondary | ICD-10-CM

## 2021-07-08 DIAGNOSIS — M6281 Muscle weakness (generalized): Secondary | ICD-10-CM

## 2021-07-08 DIAGNOSIS — M25571 Pain in right ankle and joints of right foot: Secondary | ICD-10-CM

## 2021-07-08 NOTE — Therapy (Signed)
OUTPATIENT PHYSICAL THERAPY TREATMENT NOTE   Patient Name: Jodi Keller MRN: [id number] DOB:26-Apr-1955, 66 y.o., female Today's Date: 07/08/2021  PCP: Biagio Borg, MD REFERRING PROVIDER:  Fayette Pho, [place]   PT End of Session - 07/08/21 1518     Visit Number 18    Number of Visits 21    Date for PT Re-Evaluation 08/03/21    Authorization Type [employer] / MCR    PT Start Time 1515    PT Stop Time 1600    PT Time Calculation (min) 45 min    Activity Tolerance Patient tolerated treatment well    Behavior During Therapy [facility] for tasks assessed/performed            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   Generalized anxiety disorder 11/17/2014   GERD (gastroesophageal reflux disease)    Hyperlipidemia 02/25/2011   Hypertension    Morbid obesity ([place])    Uterine prolapse    Past Surgical History:  Procedure Laterality Date   BALLOON DILATION N/A 12/29/2019   Procedure: BALLOON DILATION;  Surgeon: Irene Shipper, MD;  Location: [facility];  Service: Endoscopy;  Laterality: N/A;   CESAREAN SECTION     COLONOSCOPY WITH PROPOFOL N/A 12/29/2019   Procedure: COLONOSCOPY WITH PROPOFOL;  Surgeon: Irene Shipper, MD;  Location: [facility];  Service: Endoscopy;  Laterality: N/A;   ESOPHAGOGASTRODUODENOSCOPY (EGD) WITH PROPOFOL N/A 12/29/2019   Procedure: ESOPHAGOGASTRODUODENOSCOPY (EGD) WITH PROPOFOL;  Surgeon: Irene Shipper, MD;  Location: [facility];  Service: Endoscopy;  Laterality: N/A;   EYE SURGERY     cataract surgery per left eye    FOOT SURGERY     Patient Active Problem List   Diagnosis Date Noted   CKD (chronic kidney disease) stage 3, GFR 30-59 ml/min (HCC) 12/14/2020   Esophageal dysphagia    Esophageal stricture    Stress incontinence 12/12/2019   Ganglion cyst of dorsum of right wrist 09/04/2018   Acne 09/04/2018   Cellulitis 02/01/2017   Biceps muscle  tear 01/31/2017   Cough 01/27/2016   Wheezing 01/27/2016   Low back pain 01/27/2016   Left rotator cuff tear 09/17/2015   Left shoulder pain 09/03/2015   Hidradenitis 06/16/2015   Chest pain 06/16/2015   Generalized anxiety disorder 11/17/2014   Gait disorder 11/17/2014   Intertrigo 11/17/2014   Left knee pain 12/17/2013   Left otitis media 07/04/2013   Vertigo 06/26/2012   Allergic rhinitis, cause unspecified 06/26/2012   Eustachian tube dysfunction 06/26/2012   Polycythemia 12/20/2011   Peripheral edema 07/21/2011   Impaired glucose tolerance 07/21/2011   Hyperlipidemia 02/25/2011   Right leg pain 02/24/2011   Bilateral carpal tunnel syndrome 12/16/2010   Degenerative joint disease of ankle, 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 She denies any pain, she does note  her feet feel numb.  PAIN:  Are you having pain? No (currently) NPRS scale: 0/10 (3/10 when going from sitting to standing) Pain location: Ankle Pain orientation: Bilateral (left > right) PAIN TYPE: Chronic Pain description: Intermittent, sore Aggravating factors: Walking Relieving factors: Rest  PATIENT GOALS: Improve balance to avoid falls, walk without rollator, be able to get into pool for water aerobics   OBJECTIVE:  PATIENT SURVEYS:  FOTO: 55% functional status - 06/29/2021  (51% functional status - 05/11/2021, 44% functional  status - 04/11/2021; 44% at intake)   POSTURE: Patient with rounded shoulders, increased lumbar lordosis with forward trunk lean, knee valgus   LE AROM/PROM: Patient demonstrates gross deficits of bilateral ankle motion, not formally assessed this visit   LE MMT:  Unable to formally assess ankle strength, she does generally demonstrate gross weakness and poor control of ankle/foot   MMT Right 03/16/2021 Left 03/16/2021 Rt / Lt 04/14/2021  Hip flexion '4 4 4 ' / 4  Hip abduction '4 4 4 ' / 4  Knee flexion 5 5   Knee extension 4+ 4+     FUNCTIONAL TESTS:  5 times sit to stand: 11 without hands - 06/29/2021 (11 with hands on thighs - 05/11/2021, 16 seconds with hands on thighs - 04/11/2021; 17 seconds at intake and patient required to use BUE support on armrests to stand)  Timed up and go (TUG): 13 seconds using rollator - 05/11/2021 (17 seconds using rollator at intake)  2 minute walk test: 323 ft using rollator - 06/29/2021  (337 ft using rollator - 05/11/2021, 300 feet using rollator - 04/14/2021)   GAIT: - reassessed 06/29/2021 Distance walked: 323 ft Assistive device utilized: Rollator Level of assistance: Modified independence Comments: patient tends to ambulate on lateral aspect of foot, minimal reliance of rollator with slight forward trunk lean, trendelenburg, minimal heel strike or toe off     TODAY'S TREATMENT: [facility] Adult PT Treatment:                                                DATE: 07/08/2021 Aquatic therapy at [address] - therapeutic pool temp 91 degrees Pt enters building ambulating with rollator.  Treatment took place in water 3.8 to  4 ft 8 in.feet deep depending upon activity.  Pt entered and exited the pool via stair and handrails independently but walks to rails with rollator.  Therapeutic Exercise: Walking forwards/backwards/side stepping Runners stretch x30" BIL Hamstring stretch x30" BIL Side lunge walking with colorful dumbbells x2 laps Walking with  march x2 laps Step ups forward/lateral 2x10 ea BIL Step up and overs 2x10 BIL No UE support to challenge balance: Hip abd/add R/L 20 x each Hip ext/flex with knee straight x 20 BIL Hip flexion, knee extension x20 BIL Seated on pool noodle with 2 yellow dumbbells: Bicycle kicks Reverse bicycle kicks Scissor kicks Flutter kicks Cross country skiers Standing at [employer]" depth balance exercises: SLS x30" BIL Tandem stance x1' BIL Tandem stance with head turns Tandem stance with perturbation Tandem walking yellow noodle Pt requires the buoyancy of water for active assisted exercises with buoyancy supported for strengthening and AROM exercises. Hydrostatic pressure also supports joints by unweighting joint load by at least 50 % in 3-4 feet depth water. 80% in chest to neck deep water. Water will provide assistance with movement using the current and laminar  flow while the buoyancy reduces weight bearing. Pt requires the viscosity of the water for resistance with strengthening exercises.   [facility] Adult PT Treatment:                                                DATE: 07/06/2021 Therapeutic Exercise: NuStep L7 x 6 min with UE/LE while taking subjective Standing hip abduction 2 x 20 each Standing hip extension 2 x 20 each Standing alternating march 2 x 20 Knee extension machine 15# 2 x 20 Neuromuscular re-ed: (performed at [facility]) Romberg stance on Airex 3 x 60 sec Side stepping in // bars 2 x 2 lengths Backward walking in // bars 2 x 2 lengths Rockerboard fwd/bwd and lateral taps 2 x 20 each Gait Training: Walking in // bars without UE support Walking with single walking stick in right hand   [facility] Adult PT Treatment:                                                DATE: 06/29/2021 Therapeutic Exercise: NuStep L7 x 6 min with UE/LE while taking subjective LAQ 5# 2 x 20 each Standing hip abduction with 5# 2 x 20 each Standing hip extension with 5# 2 x 20 each Standing alternating march with 5#  2 x 20 Gait training using bilat walking sticks, cues to look up and scan environment, and lift knees, increased step length Neuromuscular re-ed: (performed at [facility]) Romberg stance on Airex 3 x 60 sec    PATIENT EDUCATION:  Education details: HEP Person educated: Patient Education method: Consulting civil engineer, Demonstration, Tactile cues, Verbal cues Education comprehension: verbalized understanding, returned demonstration, verbal cues required, tactile cues required, and needs further education   HOME EXERCISE PROGRAM: Access Code: [email or web address]     ASSESSMENT: CLINICAL IMPRESSION: Patient presents to PT with no current pain. Session today incorporated more repetitions of step ups and increased difficulty of balance exercises with no adverse effects. She frequently loses balance but is able to recover on her own in the water. Patient was able to tolerate all prescribed exercises in the aquatic environment with no adverse effects. Patient continues to benefit from skilled PT services on land and aquatic based and should be progressed as able to improve functional independence.    OBJECTIVE IMPAIRMENTS Abnormal gait, decreased balance, decreased endurance, difficulty walking, decreased ROM, decreased strength, impaired sensation, improper body mechanics, postural dysfunction, and pain.    ACTIVITY LIMITATIONS cleaning, community activity, meal prep, occupation, laundry, yard work, and shopping.    PERSONAL FACTORS Fitness, Past/current experiences, Time since onset of injury/illness/exacerbation, and 3+ comorbidities: BMI, previous surgical history, HTN, history of anxiety  are also affecting patient's functional outcome.      GOALS: Goals reviewed with patient? Yes   SHORT TERM GOALS:   STG Name Target Date Goal status  1 Patient will be I with initial HEP in order to progress with therapy. Baseline: HEP provided at evaluation 04/14/2021: independent 04/13/2021 MET  2 PT will review FOTO with  patient by 3rd visit in order to understand expected progress and outcome with therapy. Baseline: FOTO assessed at evaluation 04/14/2021: reviewed and reassessed 04/13/2021 MET  3 Patient will be able to perform a sit to stand without  requiring BUE for assist from armrests to indicate progression with LE strength Baseline: patient requires BUE pushing on armrest in order to stand 04/14/2021: patient able to perform sit to stand with hands on thighs 04/13/2021 MET    LONG TERM GOALS:    LTG Name Target Date Goal status  1 Patient will be I with final HEP to maintain progress from PT. Baseline: HEP provided at evaluation 05/11/2021: 06/29/2021: progressing HEP 08/03/2021 ONGOING  2 Patient will report >/= 70% confidence with walking up/down a ramp via FOTO assessment in order to indicate improve walking and functional ability Baseline: 10% confidence 05/11/2021: 60% confidence 06/29/2021: 70%  07/06/2021 MET  3 Patient will demonstrate 5xSTS in </= 12 seconds to indicate improved strength and reduction of fall risk Baseline: 17 seconds using BUE support on armrests 05/11/2021: 11 seconds but with hands on thighs  06/22/2021: 12 seconds without UE use 06/29/2021: 11 seconds without UE use 07/06/2021 MET  4 Patient will perform TUG in </= 13 seconds to indicate improved mobility and reduction of fall risk Baseline: 17 seconds using rollator 05/11/2021: 13 seconds using rollator 07/06/2021 MET  5 Patient will exhibit ability to ambulate >/= 546 ft in 2MWT in order to improve community access Baseline: 260 ft using rollator 05/11/2021: 337 ft using rollator 06/29/2021: 323 ft using rollator 08/03/2021 ONGOING      PLAN: PT FREQUENCY: 1x/week   PT DURATION: 8 weeks   PLANNED INTERVENTIONS: Therapeutic exercises, Therapeutic activity, Neuro Muscular re-education, Balance training, Gait training, Patient/Family education, Joint mobilization, Stair training, Aquatic Therapy, Dry Needling, and Manual therapy    PLAN FOR NEXT SESSION:  Review HEP and progress PRN, focus on generalized LE strengthening and balance training, pt requesting high/low table next session, possibly incorporate arm exercises while     Evelene Croon, PTA 07/08/21 3:19 PM

## 2021-07-11 NOTE — Therapy (Signed)
OUTPATIENT PHYSICAL THERAPY TREATMENT NOTE   Patient Name: Jodi Keller MRN: [id number] DOB:May 29, 1955, 66 y.o., female Today's Date: 07/13/2021  PCP: Biagio Borg, MD REFERRING PROVIDER:  Fayette Pho, [place]   PT End of Session - 07/13/21 1618     Visit Number 19    Number of Visits 21    Date for PT Re-Evaluation 08/03/21    Authorization Type [employer] / MCR    PT Start Time 1615    PT Stop Time 1700    PT Time Calculation (min) 45 min    Activity Tolerance Patient tolerated treatment well    Behavior During Therapy [facility] for tasks assessed/performed             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   Generalized anxiety disorder 11/17/2014   GERD (gastroesophageal reflux disease)    Hyperlipidemia 02/25/2011   Hypertension    Morbid obesity ([place])    Uterine prolapse    Past Surgical History:  Procedure Laterality Date   BALLOON DILATION N/A 12/29/2019   Procedure: BALLOON DILATION;  Surgeon: Irene Shipper, MD;  Location: [facility];  Service: Endoscopy;  Laterality: N/A;   CESAREAN SECTION     COLONOSCOPY WITH PROPOFOL N/A 12/29/2019   Procedure: COLONOSCOPY WITH PROPOFOL;  Surgeon: Irene Shipper, MD;  Location: [facility];  Service: Endoscopy;  Laterality: N/A;   ESOPHAGOGASTRODUODENOSCOPY (EGD) WITH PROPOFOL N/A 12/29/2019   Procedure: ESOPHAGOGASTRODUODENOSCOPY (EGD) WITH PROPOFOL;  Surgeon: Irene Shipper, MD;  [facility];  Service: Endoscopy;  Laterality: N/A;   EYE SURGERY     cataract surgery per left eye    FOOT SURGERY     Patient Active Problem List   Diagnosis Date Noted   CKD (chronic kidney disease) stage 3, GFR 30-59 ml/min (HCC) 12/14/2020   Esophageal dysphagia    Esophageal stricture    Stress incontinence 12/12/2019   Ganglion cyst of dorsum of right wrist 09/04/2018   Acne 09/04/2018   Cellulitis 02/01/2017   Biceps  muscle tear 01/31/2017   Cough 01/27/2016   Wheezing 01/27/2016   Low back pain 01/27/2016   Left rotator cuff tear 09/17/2015   Left shoulder pain 09/03/2015   Hidradenitis 06/16/2015   Chest pain 06/16/2015   Generalized anxiety disorder 11/17/2014   Gait disorder 11/17/2014   Intertrigo 11/17/2014   Left knee pain 12/17/2013   Left otitis media 07/04/2013   Vertigo 06/26/2012   Allergic rhinitis, cause unspecified 06/26/2012   Eustachian tube dysfunction 06/26/2012   Polycythemia 12/20/2011   Peripheral edema 07/21/2011   Impaired glucose tolerance 07/21/2011   Hyperlipidemia 02/25/2011   Right leg pain 02/24/2011   Bilateral carpal tunnel syndrome 12/16/2010   Degenerative joint disease of ankle, 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 She is checking out pools around  the area so she can find a place to go after therapy.   PAIN:  Are you having pain? No (currently) NPRS scale: 0/10 (3/10 when going from sitting to standing) Pain location: Ankle Pain orientation: Bilateral (left > right) PAIN TYPE: Chronic Pain description: Intermittent, sore Aggravating factors: Walking Relieving factors: Rest  PATIENT GOALS: Improve balance to avoid falls, walk without rollator, be able to get into pool for water aerobics   OBJECTIVE:  PATIENT SURVEYS:  FOTO: 55% functional status - 06/29/2021  (51% functional  status - 05/11/2021, 44% functional status - 04/11/2021; 44% at intake)   POSTURE: Patient with rounded shoulders, increased lumbar lordosis with forward trunk lean, knee valgus   LE AROM/PROM: Patient demonstrates gross deficits of bilateral ankle motion, not formally assessed this visit   LE MMT:  Unable to formally assess ankle strength, she does generally demonstrate gross weakness and poor control of ankle/foot   MMT Right 03/16/2021 Left 03/16/2021 Rt / Lt 04/14/2021  Hip flexion '4 4 4 ' / 4  Hip abduction '4 4 4 ' / 4  Knee flexion 5 5   Knee extension 4+ 4+     FUNCTIONAL TESTS:  5 times sit to stand: 11 without hands - 06/29/2021 (11 with hands on thighs - 05/11/2021, 16 seconds with hands on thighs - 04/11/2021; 17 seconds at intake and patient required to use BUE support on armrests to stand)  Timed up and go (TUG): 13 seconds using rollator - 05/11/2021 (17 seconds using rollator at intake)  2 minute walk test: 323 ft using rollator - 06/29/2021  (337 ft using rollator - 05/11/2021, 300 feet using rollator - 04/14/2021)   GAIT: - reassessed 06/29/2021 Distance walked: 323 ft Assistive device utilized: Rollator Level of assistance: Modified independence Comments: patient tends to ambulate on lateral aspect of foot, minimal reliance of rollator with slight forward trunk lean, trendelenburg, minimal heel strike or toe off     TODAY'S TREATMENT: [facility] Adult PT Treatment:                                                DATE: 07/13/2021 Therapeutic Exercise: NuStep L7 x 6 min with UE/LE while taking subjective Standing hip abduction with red at ankles 2 x 20 each Standing hip extension with red at ankles 2 x 20 each LAQ with 6# 2 x 20 each Standing alternating march with 6# 2 x 20 Sit to stand holding 10# at chest 2 x 10 Neuromuscular re-ed:  Romberg stance on Airex in // bars 2 x 60 sec Modified tandem in // bars 2 x 30 sec Side stepping in // bars 2 x 3 lengths Backward walking in //  bars 2 x 3 lengths Rockerboard fwd/bwd and lateral taps 2 x 20 each Gait Training: Walking in // bars without UE support - focus on on arm swing   [facility] Adult PT Treatment:                                                DATE: 07/06/2021 Therapeutic Exercise: NuStep L7 x 6 min with UE/LE while taking subjective Standing hip abduction 2 x 20 each Standing hip extension 2 x 20 each Standing alternating march 2 x 20  Knee extension machine 15# 2 x 20 Neuromuscular re-ed: (performed at [facility]) Romberg stance on Airex 3 x 60 sec Side stepping in // bars 2 x 2 lengths Backward walking in // bars 2 x 2 lengths Rockerboard fwd/bwd and lateral taps 2 x 20 each Gait Training: Walking in // bars without UE support Walking with single walking stick in right hand  [facility] Adult PT Treatment:                                                DATE: 06/29/2021 Therapeutic Exercise: NuStep L7 x 6 min with UE/LE while taking subjective LAQ 5# 2 x 20 each Standing hip abduction with 5# 2 x 20 each Standing hip extension with 5# 2 x 20 each Standing alternating march with 5# 2 x 20 Gait training using bilat walking sticks, cues to look up and scan environment, and lift knees, increased step length Neuromuscular re-ed: (performed at [facility]) Romberg stance on Airex 3 x 60 sec  PATIENT EDUCATION:  Education details: HEP Person educated: Patient Education method: Consulting civil engineer, Demonstration, Tactile cues, Verbal cues Education comprehension: verbalized understanding, returned demonstration, verbal cues required, tactile cues required, and needs further education   HOME EXERCISE PROGRAM: Access Code: [email or web address]     ASSESSMENT: CLINICAL IMPRESSION: Patient tolerated therapy well with no adverse effects. Therapy continues to focus on balance and gait training, and progression of hip and knee strength to improve walking ability. She does well with her walking tasks when in an environment where she can hold on to an object  but continues to exhibit fearful movement when in open spaces. She seems to be progressing well with her strengthening exercises and is able to maintain balance much better. No changes to HEP, patient encouraged to find a pool she can perform exercise and classes to further progress her strength and balance. Patient would benefit from continued skilled PT to progress her mobility and strength in order to reduce pain and improve walking to maximize functional ability.    OBJECTIVE IMPAIRMENTS Abnormal gait, decreased balance, decreased endurance, difficulty walking, decreased ROM, decreased strength, impaired sensation, improper body mechanics, postural dysfunction, and pain.    ACTIVITY LIMITATIONS cleaning, community activity, meal prep, occupation, laundry, yard work, and shopping.    PERSONAL FACTORS Fitness, Past/current experiences, Time since onset of injury/illness/exacerbation, and 3+ comorbidities: BMI, previous surgical history, HTN, history of anxiety  are also affecting patient's functional outcome.      GOALS: Goals reviewed with patient? Yes   SHORT TERM GOALS:   STG Name Target Date Goal status  1 Patient will be I with initial HEP in order to progress with therapy. Baseline: HEP provided at evaluation 04/14/2021: independent 04/13/2021 MET  2 PT will review FOTO with patient by 3rd visit in order to understand expected progress and outcome with therapy. Baseline: FOTO assessed at evaluation 04/14/2021: reviewed and reassessed 04/13/2021 MET  3 Patient will be able to perform a sit to stand without requiring BUE for assist from armrests to indicate progression with LE strength Baseline: patient requires BUE pushing on armrest in order to stand 04/14/2021: patient able to perform sit to stand with hands on thighs 04/13/2021 MET    LONG TERM GOALS:    LTG Name Target Date Goal status  1 Patient will be I with final HEP to maintain progress from PT. Baseline:  HEP provided at  evaluation 05/11/2021: 06/29/2021: progressing HEP 08/03/2021 ONGOING  2 Patient will report >/= 70% confidence with walking up/down a ramp via FOTO assessment in order to indicate improve walking and functional ability Baseline: 10% confidence 05/11/2021: 60% confidence 06/29/2021: 70%  07/06/2021 MET  3 Patient will demonstrate 5xSTS in </= 12 seconds to indicate improved strength and reduction of fall risk Baseline: 17 seconds using BUE support on armrests 05/11/2021: 11 seconds but with hands on thighs  06/22/2021: 12 seconds without UE use 06/29/2021: 11 seconds without UE use 07/06/2021 MET  4 Patient will perform TUG in </= 13 seconds to indicate improved mobility and reduction of fall risk Baseline: 17 seconds using rollator 05/11/2021: 13 seconds using rollator 07/06/2021 MET  5 Patient will exhibit ability to ambulate >/= 546 ft in 2MWT in order to improve community access Baseline: 260 ft using rollator 05/11/2021: 337 ft using rollator 06/29/2021: 323 ft using rollator 08/03/2021 ONGOING      PLAN: PT FREQUENCY: 1x/week   PT DURATION: 8 weeks   PLANNED INTERVENTIONS: Therapeutic exercises, Therapeutic activity, Neuro Muscular re-education, Balance training, Gait training, Patient/Family education, Joint mobilization, Stair training, Aquatic Therapy, Dry Needling, and Manual therapy   PLAN FOR NEXT SESSION:  Review HEP and progress PRN, focus on generalized LE strengthening and balance training, pt requesting high/low table next session, possibly incorporate arm exercises while     Hilda Blades, PT, DPT, LAT, ATC 07/13/21  5:01 PM Phone: [phone] Fax: [phone]

## 2021-07-13 ENCOUNTER — Other Ambulatory Visit: Payer: Self-pay

## 2021-07-13 ENCOUNTER — Ambulatory Visit: Payer: Managed Care, Other (non HMO) | Admitting: Physical Therapy

## 2021-07-13 ENCOUNTER — Encounter: Payer: Self-pay | Admitting: Physical Therapy

## 2021-07-13 DIAGNOSIS — M6281 Muscle weakness (generalized): Secondary | ICD-10-CM

## 2021-07-13 DIAGNOSIS — M25572 Pain in left ankle and joints of left foot: Secondary | ICD-10-CM

## 2021-07-13 DIAGNOSIS — M25571 Pain in right ankle and joints of right foot: Secondary | ICD-10-CM

## 2021-07-13 DIAGNOSIS — R2689 Other abnormalities of gait and mobility: Secondary | ICD-10-CM

## 2021-07-20 ENCOUNTER — Ambulatory Visit: Payer: Managed Care, Other (non HMO)

## 2021-07-20 DIAGNOSIS — R2689 Other abnormalities of gait and mobility: Secondary | ICD-10-CM

## 2021-07-20 DIAGNOSIS — M25571 Pain in right ankle and joints of right foot: Secondary | ICD-10-CM

## 2021-07-20 DIAGNOSIS — M25572 Pain in left ankle and joints of left foot: Secondary | ICD-10-CM

## 2021-07-20 DIAGNOSIS — M6281 Muscle weakness (generalized): Secondary | ICD-10-CM

## 2021-07-20 NOTE — Therapy (Signed)
OUTPATIENT PHYSICAL THERAPY TREATMENT NOTE   Patient Name: Jodi Keller MRN: [id number] DOB:[birth year], 66 y.o., female Today's Date: 07/20/2021  PCP: Biagio Borg, MD REFERRING PROVIDER:  Fayette Pho, [place]   PT End of Session - 07/20/21 1528     Visit Number 20    Number of Visits 21    Date for PT Re-Evaluation 08/03/21    Authorization Type [employer] / MCR    PT Start Time 1527    PT Stop Time 8841    PT Time Calculation (min) 45 min    Activity Tolerance Patient tolerated treatment well    Behavior During Therapy [facility] for tasks assessed/performed              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   Generalized anxiety disorder 11/17/2014   GERD (gastroesophageal reflux disease)    Hyperlipidemia 02/25/2011   Hypertension    Morbid obesity ([place])    Uterine prolapse    Past Surgical History:  Procedure Laterality Date   BALLOON DILATION N/A 12/29/2019   Procedure: BALLOON DILATION;  Surgeon: Irene Shipper, MD;  [facility];  Service: Endoscopy;  Laterality: N/A;   CESAREAN SECTION     COLONOSCOPY WITH PROPOFOL N/A 12/29/2019   Procedure: COLONOSCOPY WITH PROPOFOL;  Surgeon: Irene Shipper, MD;  [facility];  Service: Endoscopy;  Laterality: N/A;   ESOPHAGOGASTRODUODENOSCOPY (EGD) WITH PROPOFOL N/A 12/29/2019   Procedure: ESOPHAGOGASTRODUODENOSCOPY (EGD) WITH PROPOFOL;  Surgeon: Irene Shipper, MD;  [facility];  Service: Endoscopy;  Laterality: N/A;   EYE SURGERY     cataract surgery per left eye    FOOT SURGERY     Patient Active Problem List   Diagnosis Date Noted   CKD (chronic kidney disease) stage 3, GFR 30-59 ml/min (HCC) 12/14/2020   Esophageal dysphagia    Esophageal stricture    Stress incontinence 12/12/2019   Ganglion cyst of dorsum of right wrist 09/04/2018   Acne 09/04/2018   Cellulitis 02/01/2017   Biceps  muscle tear 01/31/2017   Cough 01/27/2016   Wheezing 01/27/2016   Low back pain 01/27/2016   Left rotator cuff tear 09/17/2015   Left shoulder pain 09/03/2015   Hidradenitis 06/16/2015   Chest pain 06/16/2015   Generalized anxiety disorder 11/17/2014   Gait disorder 11/17/2014   Intertrigo 11/17/2014   Left knee pain 12/17/2013   Left otitis media 07/04/2013   Vertigo 06/26/2012   Allergic rhinitis, cause unspecified 06/26/2012   Eustachian tube dysfunction 06/26/2012   Polycythemia 12/20/2011   Peripheral edema 07/21/2011   Impaired glucose tolerance 07/21/2011   Hyperlipidemia 02/25/2011   Right leg pain 02/24/2011   Bilateral carpal tunnel syndrome 12/16/2010   Degenerative joint disease of ankle, 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   Colon cancer screening 12/10/2010   FATIGUE 10/30/2007   Morbid obesity ([place]) 11/13/2006   ANEMIA-IRON DEFICIENCY 11/13/2006   Essential hypertension 11/13/2006   GERD 11/13/2006    REFERRING DIAG: Primary osteoarthritis, right ankle and foot  THERAPY DIAG:  Other abnormalities of gait and mobility  Pain in right ankle and joints of right foot  Pain in left ankle and joints of left foot  Muscle weakness (generalized)  PERTINENT HISTORY: Right ankle fusion 2022  PRECAUTIONS: Fall  ONSET DATE: 03/31/2020 (DOS for right ankle fusion)   SUBJECTIVE: Patient reports that she is doing well.   PAIN:  Are you having pain? No (currently) NPRS scale: 0/10 (3/10 when going from sitting to standing) Pain location: Ankle Pain orientation: Bilateral (left > right) PAIN TYPE: Chronic Pain description: Intermittent, sore Aggravating factors: Walking Relieving factors: Rest  PATIENT GOALS: Improve balance to avoid falls, walk without rollator, be able to get into pool for water aerobics   OBJECTIVE:  PATIENT SURVEYS:  FOTO: 55% functional status - 06/29/2021  (51% functional status - 05/11/2021, 44% functional status - 04/11/2021; 44% at intake)    POSTURE: Patient with rounded shoulders, increased lumbar lordosis with forward trunk lean, knee valgus   LE AROM/PROM: Patient demonstrates gross deficits of bilateral ankle motion, not formally assessed this visit   LE MMT:  Unable to formally assess ankle strength, she does generally demonstrate gross weakness and poor control of ankle/foot   MMT Right 03/16/2021 Left 03/16/2021 Rt / Lt 04/14/2021  Hip flexion _0 / 4  Hip abduction _1 / 4  Knee flexion 5 5   Knee extension 4+ 4+     FUNCTIONAL TESTS:  5 times sit to stand: 11 without hands - 06/29/2021 (11 with hands on thighs - 05/11/2021, 16 seconds with hands on thighs - 04/11/2021; 17 seconds at intake and patient required to use BUE support on armrests to stand)  Timed up and go (TUG): 13 seconds using rollator - 05/11/2021 (17 seconds using rollator at intake)  2 minute walk test: 323 ft using rollator - 06/29/2021  (337 ft using rollator - 05/11/2021, 300 feet using rollator - 04/14/2021)   GAIT: - reassessed 06/29/2021 Distance walked: 323 ft Assistive device utilized: Rollator Level of assistance: Modified independence Comments: patient tends to ambulate on lateral aspect of foot, minimal reliance of rollator with slight forward trunk lean, trendelenburg, minimal heel strike or toe off     TODAY'S TREATMENT: [facility] Adult PT Treatment:                                                DATE: 07/20/2021 Therapeutic Exercise: NuStep L7 x 6 min with UE/LE while taking subjective Standing hip abduction with red at ankles 2 x 20 each Standing hip extension with red at ankles 2 x 20 each LAQ with 5# 2 x 20 each Standing alternating march with 5# 2 x 20 Sit to stand holding 10# at chest 2 x 10 Neuromuscular re-ed:  Romberg stance on Airex in // bars x30" Semi tandem on Airex in // bars x30" BIL Modified tandem in // bars 2 x 30 sec Side stepping in // bars 2 x 3 lengths Backward walking in // bars 2 x 3 lengths Rockerboard  fwd/bwd and lateral taps 2 x 20 each Gait Training: Walking in // bars without UE support - focus on on arm swing   [facility] Adult PT Treatment:                                                DATE: 07/13/2021 Therapeutic Exercise: NuStep L7 x 6 min with UE/LE while taking subjective Standing hip abduction with red at ankles 2 x 20 each Standing hip extension with red at ankles 2 x 20 each LAQ with 6# 2 x 20 each Standing  alternating march with 6# 2 x 20 Sit to stand holding 10# at chest 2 x 10 Neuromuscular re-ed:  Romberg stance on Airex in // bars 2 x 60 sec Modified tandem in // bars 2 x 30 sec Side stepping in // bars 2 x 3 lengths Backward walking in // bars 2 x 3 lengths Rockerboard fwd/bwd and lateral taps 2 x 20 each Gait Training: Walking in // bars without UE support - focus on on arm swing   [facility] Adult PT Treatment:                                                DATE: 07/06/2021 Therapeutic Exercise: NuStep L7 x 6 min with UE/LE while taking subjective Standing hip abduction 2 x 20 each Standing hip extension 2 x 20 each Standing alternating march 2 x 20 Knee extension machine 15# 2 x 20 Neuromuscular re-ed: (performed at [facility]) Romberg stance on Airex 3 x 60 sec Side stepping in // bars 2 x 2 lengths Backward walking in // bars 2 x 2 lengths Rockerboard fwd/bwd and lateral taps 2 x 20 each Gait Training: Walking in // bars without UE support Walking with single walking stick in right hand  [facility] Adult PT Treatment:                                                DATE: 06/29/2021 Therapeutic Exercise: NuStep L7 x 6 min with UE/LE while taking subjective LAQ 5# 2 x 20 each Standing hip abduction with 5# 2 x 20 each Standing hip extension with 5# 2 x 20 each Standing alternating march with 5# 2 x 20 Gait training using bilat walking sticks, cues to look up and scan environment, and lift knees, increased step length Neuromuscular re-ed: (performed at [facility]) Romberg stance on  Airex 3 x 60 sec  PATIENT EDUCATION:  Education details: HEP Person educated: Patient Education method: Consulting civil engineer, Demonstration, Tactile cues, Verbal cues Education comprehension: verbalized understanding, returned demonstration, verbal cues required, tactile cues required, and needs further education   HOME EXERCISE PROGRAM: Access Code: [email or web address]     ASSESSMENT: CLINICAL IMPRESSION: Patient presents to PT with no current pain and reports HEP compliance. Session today continued to focus on hip and knee strengthening as well as balance and gait training. Worked on ambulation outside of the parallel bars and patient remains very fearful if she doesn't have a wall or other stable object to hold onto. Attempted to encourage patient to ambulate away from wall in an open area, but she remained too fearful. Patient continues to benefit from skilled PT services and should be progressed as able to improve functional independence.    OBJECTIVE IMPAIRMENTS Abnormal gait, decreased balance, decreased endurance, difficulty walking, decreased ROM, decreased strength, impaired sensation, improper body mechanics, postural dysfunction, and pain.    ACTIVITY LIMITATIONS cleaning, community activity, meal prep, occupation, laundry, yard work, and shopping.    PERSONAL FACTORS Fitness, Past/current experiences, Time since onset of injury/illness/exacerbation, and 3+ comorbidities: BMI, previous surgical history, HTN, history of anxiety  are also affecting patient's functional outcome.      GOALS: Goals reviewed with patient? Yes   SHORT TERM GOALS:   STG Name Target Date Goal  status  1 Patient will be I with initial HEP in order to progress with therapy. Baseline: HEP provided at evaluation 04/14/2021: independent 04/13/2021 MET  2 PT will review FOTO with patient by 3rd visit in order to understand expected progress and outcome with therapy. Baseline: FOTO assessed at evaluation 04/14/2021: reviewed  and reassessed 04/13/2021 MET  3 Patient will be able to perform a sit to stand without requiring BUE for assist from armrests to indicate progression with LE strength Baseline: patient requires BUE pushing on armrest in order to stand 04/14/2021: patient able to perform sit to stand with hands on thighs 04/13/2021 MET    LONG TERM GOALS:    LTG Name Target Date Goal status  1 Patient will be I with final HEP to maintain progress from PT. Baseline: HEP provided at evaluation 05/11/2021: 06/29/2021: progressing HEP 08/03/2021 ONGOING  2 Patient will report >/= 70% confidence with walking up/down a ramp via FOTO assessment in order to indicate improve walking and functional ability Baseline: 10% confidence 05/11/2021: 60% confidence 06/29/2021: 70%  07/06/2021 MET  3 Patient will demonstrate 5xSTS in </= 12 seconds to indicate improved strength and reduction of fall risk Baseline: 17 seconds using BUE support on armrests 05/11/2021: 11 seconds but with hands on thighs  06/22/2021: 12 seconds without UE use 06/29/2021: 11 seconds without UE use 07/06/2021 MET  4 Patient will perform TUG in </= 13 seconds to indicate improved mobility and reduction of fall risk Baseline: 17 seconds using rollator 05/11/2021: 13 seconds using rollator 07/06/2021 MET  5 Patient will exhibit ability to ambulate >/= 546 ft in 2MWT in order to improve community access Baseline: 260 ft using rollator 05/11/2021: 337 ft using rollator 06/29/2021: 323 ft using rollator 08/03/2021 ONGOING      PLAN: PT FREQUENCY: 1x/week   PT DURATION: 8 weeks   PLANNED INTERVENTIONS: Therapeutic exercises, Therapeutic activity, Neuro Muscular re-education, Balance training, Gait training, Patient/Family education, Joint mobilization, Stair training, Aquatic Therapy, Dry Needling, and Manual therapy   PLAN FOR NEXT SESSION:  Review HEP and progress PRN, focus on generalized LE strengthening and balance training, pt requesting high/low table next  session, possibly incorporate arm exercises while     Evelene Croon, PTA 07/20/21 3:29 PM

## 2021-07-21 ENCOUNTER — Ambulatory Visit (INDEPENDENT_AMBULATORY_CARE_PROVIDER_SITE_OTHER): Payer: Managed Care, Other (non HMO) | Admitting: Internal Medicine

## 2021-07-21 VITALS — BP 130/68 | HR 66 | Temp 98.5°F | Ht 67.0 in | Wt 308.5 lb

## 2021-07-21 DIAGNOSIS — E538 Deficiency of other specified B group vitamins: Secondary | ICD-10-CM | POA: Diagnosis not present

## 2021-07-21 DIAGNOSIS — E119 Type 2 diabetes mellitus without complications: Secondary | ICD-10-CM | POA: Diagnosis not present

## 2021-07-21 DIAGNOSIS — Z0001 Encounter for general adult medical examination with abnormal findings: Secondary | ICD-10-CM | POA: Diagnosis not present

## 2021-07-21 DIAGNOSIS — E559 Vitamin D deficiency, unspecified: Secondary | ICD-10-CM

## 2021-07-21 DIAGNOSIS — I10 Essential (primary) hypertension: Secondary | ICD-10-CM

## 2021-07-21 DIAGNOSIS — R197 Diarrhea, unspecified: Secondary | ICD-10-CM

## 2021-07-21 DIAGNOSIS — N1831 Chronic kidney disease, stage 3a: Secondary | ICD-10-CM

## 2021-07-21 DIAGNOSIS — R1319 Other dysphagia: Secondary | ICD-10-CM

## 2021-07-21 DIAGNOSIS — D509 Iron deficiency anemia, unspecified: Secondary | ICD-10-CM

## 2021-07-21 DIAGNOSIS — E7800 Pure hypercholesterolemia, unspecified: Secondary | ICD-10-CM

## 2021-07-21 MED ORDER — RYBELSUS 3 MG PO TABS: 3.0000 mg | ORAL_TABLET | Freq: Every day | ORAL | 3 refills | Status: DC

## 2021-07-21 MED ORDER — RIFAXIMIN 200 MG PO TABS: 200.0000 mg | ORAL_TABLET | Freq: Three times a day (TID) | ORAL | 0 refills | Status: AC

## 2021-07-21 NOTE — Patient Instructions (Signed)
Please take all new medication as prescribed - the xifaxan for 2 wks  Ok to take the Rybelsus after this is the Gi symptoms are improved  Be sure to follow up with Dr Henrene Pastor for the esophageal dysphagia as you probably need a dilation again  Please continue all other medications as before, and refills have been done if requested.  Please have the pharmacy call with any other refills you may need.  Please continue your efforts at being more active, low cholesterol diet, and weight control.  You are otherwise up to date with prevention measures today.  Please keep your appointments with your specialists as you may have planned  Please go to the LAB at the blood drawing area for the tests to be done - at the [facility] will be contacted by phone if any changes need to be made immediately.  Otherwise, you will receive a letter about your results with an explanation, but please check with MyChart first.  Please remember to sign up for MyChart if you have not done so, as this will be important to you in the future with finding out test results, communicating by private email, and scheduling acute appointments online when needed.  Please make an Appointment to return in 6 months, or sooner if needed

## 2021-07-21 NOTE — Progress Notes (Signed)
Patient ID: Jodi Keller, female   DOB: August 10, 1955, 66 y.o.   MRN: [id number]         Chief Complaint:: wellness exam and Nausea (Been going on for week, causing her to have diarrhea/Gurgling issues with it and also been feeling weak  )  , dm diet controlled, dysphagia, ckd 3       HPI:  Jodi Keller is a 66 y.o. female here for wellness exam; plans to make eye exam appt soon, declines covid booster and pneumovax for now, o/w up to date                        Also c/o 3 wks onset IBS with diarrhea like symptoms, with little to no pain but uncomfortable nausea, gurgling, diarrhea, bloating, bowel urgency and even a couple of incontinence episodes, without fever, chills, and denies worsening reflux, other abd pain, vomiting, other bowel change or blood, lower appetite or wt loss.  Recent wt has been about the same, though has managed to lose from 380 to 308 since dec 2021 with better diet.  Now stuck and plateaued about current wt for now, unable to lose further it seems.   Has hx of dysphagia and esophageal dilation per Dr Henrene Pastor GI, and now with mild worsening lower mid chest dysphagia recurring it seems, has an appt for July 31 to f/u.     Wt Readings from Last 3 Encounters:  07/21/21 (!) 308 lb 8 oz (139.9 kg)  12/13/20 (!) 304 lb (137.9 kg)  12/29/19 (!) 380 lb (172.4 kg)   BP Readings from Last 3 Encounters:  07/21/21 130/68  12/13/20 132/82  12/29/19 (!) 171/94   Immunization History  Administered Date(s) Administered   Influenza, Seasonal, Injecte, Preservative Fre 12/20/2011   Influenza,inj,Quad PF,6+ Mos 11/17/2014, 11/23/2017, 09/16/2018, 12/12/2019   Influenza-Unspecified 10/07/2016, 11/23/2017, 11/07/2020   PFIZER(Purple Top)SARS-COV-2 Vaccination 04/17/2019, 05/12/2019, 12/28/2019   Pneumococcal Polysaccharide-23 08/23/1997   Tdap 12/02/2014   Zoster Recombinat (Shingrix) 09/04/2018, 12/21/2018   There are no preventive care reminders to display for this patient.      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   Generalized anxiety disorder 11/17/2014   GERD (gastroesophageal reflux disease)    Hyperlipidemia 02/25/2011   Hypertension    Morbid obesity ([place])    Uterine prolapse    Past Surgical History:  Procedure Laterality Date   BALLOON DILATION N/A 12/29/2019   Procedure: BALLOON DILATION;  Surgeon: Irene Shipper, MD;  Location: [facility];  Service: Endoscopy;  Laterality: N/A;   CESAREAN SECTION     COLONOSCOPY WITH PROPOFOL N/A 12/29/2019   Procedure: COLONOSCOPY WITH PROPOFOL;  Surgeon: Irene Shipper, MD;  Location: [facility];  Service: Endoscopy;  Laterality: N/A;   ESOPHAGOGASTRODUODENOSCOPY (EGD) WITH PROPOFOL N/A 12/29/2019   Procedure: ESOPHAGOGASTRODUODENOSCOPY (EGD) WITH PROPOFOL;  Surgeon: Irene Shipper, MD;  Location: [facility];  Service: Endoscopy;  Laterality: N/A;   EYE SURGERY     cataract surgery per left eye    FOOT SURGERY      reports that she has never smoked. She has never used smokeless tobacco. She reports current alcohol use. She reports that she does not use drugs. family history includes Cancer in her father, sister, and sister; Diabetes in her maternal grandmother; Thyroid disease in an other family member. Allergies  Allergen Reactions  Zoster Vac Recomb Adjuvanted Diarrhea and Other (See Comments)    Headache   Zinc Other (See Comments)    redness   Current Outpatient Medications on File Prior to Visit  Medication Sig Dispense Refill   Ascorbic Acid (VITAMIN C PO) Take 1 tablet by mouth at bedtime.     aspirin 81 MG chewable tablet Chew 1 tablet (81 mg total) by mouth daily. (Patient taking differently: Chew 81 mg by mouth at bedtime.) 30 tablet 0   atorvastatin (LIPITOR) 10 MG tablet TAKE 1 TABLET BY MOUTH EVERY DAY 90 tablet 1   b complex vitamins capsule Take 1 capsule by mouth at bedtime.      benazepril (LOTENSIN) 20 MG tablet TAKE 1 TABLET BY MOUTH EVERY DAY 90 tablet 1   CALCIUM PO Take 1 tablet by mouth at bedtime.     cholecalciferol (VITAMIN D) 1000 units tablet Take 1 tablet (1,000 Units total) by mouth daily as needed. Often forgets to take and doesn't know strength 30 tablet 3   clotrimazole-betamethasone (LOTRISONE) cream USE AS DIRECTED TWICE DAILY AS NEEDED (Patient taking differently: Apply 1 application  topically daily as needed (Rash).) 15 g 3   hydrochlorothiazide (MICROZIDE) 12.5 MG capsule TAKE 1 CAPSULE (12.5 MG TOTAL) BY MOUTH AT BEDTIME. 90 capsule 2   Multiple Vitamins-Minerals (MULTIVITAMIN & MINERAL PO) Take 1 tablet by mouth at bedtime.      nystatin cream (MYCOSTATIN) Apply 1 application topically 2 (two) times daily. 30 g 3   nystatin powder Apply 1 application topically 3 (three) times daily. 60 g 3   omeprazole (PRILOSEC) 20 MG capsule TAKE 1 CAPSULE BY MOUTH EVERY DAY 90 capsule 1   Sulfacetamide Sodium, Acne, 10 % LOTN Apply 1 application topically daily as needed. 118 mL 5   No current facility-administered medications on file prior to visit.        ROS:  All others reviewed and negative.  Objective        PE:  BP 130/68   Pulse 66   Temp 98.5 F (36.9 C) (Oral)   Ht '5\' 7"'$  (1.702 m)   Wt (!) 308 lb 8 oz (139.9 kg)   LMP 01/12/2011   SpO2 95%   BMI 48.32 kg/m                 Constitutional: Pt appears in NAD               HENT: Head: NCAT.                Right Ear: External ear normal.                 Left Ear: External ear normal.                Eyes: . Pupils are equal, round, and reactive to light. Conjunctivae and EOM are normal               Nose: without d/c or deformity               Neck: Neck supple. Gross normal ROM               Cardiovascular: Normal rate and regular rhythm.                 Pulmonary/Chest: Effort normal and breath sounds without rales or wheezing.                Abd:  Soft, NT,  ND, + BS, no organomegaly                Neurological: Pt is alert. At baseline orientation, motor grossly intact               Skin: Skin is warm. No rashes, no other new lesions, LE edema - none               Psychiatric: Pt behavior is normal without agitation   Micro: none  Cardiac tracings I have personally interpreted today:  none  Pertinent Radiological findings (summarize): none   Lab Results  Component Value Date   WBC 6.1 07/22/2021   HGB 14.6 07/22/2021   HCT 44.0 07/22/2021   PLT 275.0 07/22/2021   GLUCOSE 77 07/22/2021   CHOL 130 07/22/2021   TRIG 134.0 07/22/2021   HDL 47.50 07/22/2021   LDLCALC 55 07/22/2021   ALT 14 07/22/2021   AST 18 07/22/2021   NA 137 07/22/2021   K 4.1 07/22/2021   CL 102 07/22/2021   CREATININE 1.02 07/22/2021   BUN 23 07/22/2021   CO2 29 07/22/2021   TSH 3.68 07/22/2021   HGBA1C 5.7 07/22/2021   MICROALBUR 3.8 (H) 07/22/2021   Assessment/Plan:  Jodi Keller is a 66 y.o. White or Caucasian [1] female with  has a past medical history of Anemia, Bilateral carpal tunnel syndrome (12/16/2010), Degenerative joint disease of ankle, left (12/16/2010), Generalized anxiety disorder (11/17/2014), GERD (gastroesophageal reflux disease), Hyperlipidemia (02/25/2011), Hypertension, Morbid obesity ([place]), and Uterine prolapse.  Encounter for well adult exam with abnormal findings Age and sex appropriate education and counseling updated with regular exercise and diet Referrals for preventative services - plans to call herself for f/u eye exam soon Immunizations addressed - declines covid booster and pneumovax for now Smoking counseling  - none needed Evidence for depression or other mood disorder - none significant Most recent labs reviewed. I have personally reviewed and have noted: 1) the patient's medical and social history 2) The patient's current medications and supplements 3) The patient's height, weight, and BMI have been recorded in the chart   Hyperlipidemia Lab  Results  Component Value Date   LDLCALC 55 07/22/2021   Stable, pt to continue current statin lipitor 10 mg   Esophageal dysphagia Mild without vomiting or wt loss - has f/u app with Dr Henrene Pastor July 31, may need repeat EGD and dilation  Essential hypertension BP Readings from Last 3 Encounters:  07/21/21 130/68  12/13/20 132/82  12/29/19 (!) 171/94   Stable, pt to continue medical treatment lotensin 20 mg qd, and hct 12.5 mg qd   Diabetes mellitus type 2, diet-controlled ([place]) With unable to lose further wt - for rybelsus 3 mg if ok with insurance  CKD (chronic kidney disease) stage 3, GFR 30-59 ml/min ([place]) Lab Results  Component Value Date   CREATININE 1.02 07/22/2021   Stable overall, cont to avoid nephrotoxins  ANEMIA-IRON DEFICIENCY Also for iron with lab f/u  Diarrhea Likely IBS with diarrhea - for xifaxan x 2 wks, f/u GI as planned  Followup: Return in about 6 months (around 01/20/2022).  Cathlean Cower, MD 07/24/2021 10:08 AM [facility]

## 2021-07-22 ENCOUNTER — Telehealth: Payer: Self-pay | Admitting: *Deleted

## 2021-07-22 ENCOUNTER — Other Ambulatory Visit (INDEPENDENT_AMBULATORY_CARE_PROVIDER_SITE_OTHER): Payer: Managed Care, Other (non HMO)

## 2021-07-22 DIAGNOSIS — D509 Iron deficiency anemia, unspecified: Secondary | ICD-10-CM

## 2021-07-22 DIAGNOSIS — E559 Vitamin D deficiency, unspecified: Secondary | ICD-10-CM

## 2021-07-22 DIAGNOSIS — E119 Type 2 diabetes mellitus without complications: Secondary | ICD-10-CM | POA: Diagnosis not present

## 2021-07-22 DIAGNOSIS — E538 Deficiency of other specified B group vitamins: Secondary | ICD-10-CM

## 2021-07-22 LAB — CBC WITH DIFFERENTIAL/PLATELET
Basophils Absolute: 0 10*3/uL (ref 0.0–0.1)
Basophils Relative: 0.4 % (ref 0.0–3.0)
Eosinophils Absolute: 0.2 10*3/uL (ref 0.0–0.7)
Eosinophils Relative: 2.7 % (ref 0.0–5.0)
HCT: 44 % (ref 36.0–46.0)
Hemoglobin: 14.6 g/dL (ref 12.0–15.0)
Lymphocytes Relative: 26.5 % (ref 12.0–46.0)
Lymphs Abs: 1.6 10*3/uL (ref 0.7–4.0)
MCHC: 33.2 g/dL (ref 30.0–36.0)
MCV: 91.6 fl (ref 78.0–100.0)
Monocytes Absolute: 0.6 10*3/uL (ref 0.1–1.0)
Monocytes Relative: 10.4 % (ref 3.0–12.0)
Neutro Abs: 3.7 10*3/uL (ref 1.4–7.7)
Neutrophils Relative %: 60 % (ref 43.0–77.0)
Platelets: 275 10*3/uL (ref 150.0–400.0)
RBC: 4.81 Mil/uL (ref 3.87–5.11)
RDW: 15.2 % (ref 11.5–15.5)
WBC: 6.1 10*3/uL (ref 4.0–10.5)

## 2021-07-22 LAB — BASIC METABOLIC PANEL
BUN: 23 mg/dL (ref 6–23)
CO2: 29 mEq/L (ref 19–32)
Calcium: 9.7 mg/dL (ref 8.4–10.5)
Chloride: 102 mEq/L (ref 96–112)
Creatinine, Ser: 1.02 mg/dL (ref 0.40–1.20)
GFR: 57.51 mL/min — ABNORMAL LOW (ref >60.00)
Glucose, Bld: 77 mg/dL (ref 70–99)
Potassium: 4.1 mEq/L (ref 3.5–5.1)
Sodium: 137 mEq/L (ref 135–145)

## 2021-07-22 LAB — MICROALBUMIN / CREATININE URINE RATIO
Creatinine,U: 205.5 mg/dL
Microalb Creat Ratio: 1.9 mg/g (ref 0.0–30.0)
Microalb, Ur: 3.8 mg/dL — ABNORMAL HIGH (ref 0.0–1.9)

## 2021-07-22 LAB — URINALYSIS, ROUTINE W REFLEX MICROSCOPIC
Bilirubin Urine: NEGATIVE
Hgb urine dipstick: NEGATIVE
Ketones, ur: NEGATIVE
Nitrite: NEGATIVE
Specific Gravity, Urine: >=1.03 — AB (ref 1.000–1.030)
Urine Glucose: NEGATIVE
Urobilinogen, UA: 0.2 (ref 0.0–1.0)
pH: 5 (ref 5.0–8.0)

## 2021-07-22 LAB — HEPATIC FUNCTION PANEL
ALT: 14 U/L (ref 0–35)
AST: 18 U/L (ref 0–37)
Albumin: 3.9 g/dL (ref 3.5–5.2)
Alkaline Phosphatase: 82 U/L (ref 39–117)
Bilirubin, Direct: 0.3 mg/dL (ref 0.0–0.3)
Total Bilirubin: 1 mg/dL (ref 0.2–1.2)
Total Protein: 7.2 g/dL (ref 6.0–8.3)

## 2021-07-22 LAB — LIPID PANEL
Cholesterol: 130 mg/dL (ref 0–200)
HDL: 47.5 mg/dL (ref >39.00)
LDL Cholesterol: 55 mg/dL (ref 0–99)
NonHDL: 82.24
Total CHOL/HDL Ratio: 3
Triglycerides: 134 mg/dL (ref 0.0–149.0)
VLDL: 26.8 mg/dL (ref 0.0–40.0)

## 2021-07-22 LAB — TSH: TSH: 3.68 u[IU]/mL (ref 0.35–5.50)

## 2021-07-22 LAB — IBC PANEL
Iron: 70 ug/dL (ref 42–145)
Saturation Ratios: 20.9 % (ref 20.0–50.0)
TIBC: 334.6 ug/dL (ref 250.0–450.0)
Transferrin: 239 mg/dL (ref 212.0–360.0)

## 2021-07-22 LAB — VITAMIN B12: Vitamin B-12: 741 pg/mL (ref 211–911)

## 2021-07-22 LAB — FERRITIN: Ferritin: 31.6 ng/mL (ref 10.0–291.0)

## 2021-07-22 LAB — VITAMIN D 25 HYDROXY (VIT D DEFICIENCY, FRACTURES): VITD: 71.08 ng/mL (ref 30.00–100.00)

## 2021-07-22 LAB — HEMOGLOBIN A1C: Hgb A1c MFr Bld: 5.7 % (ref 4.6–6.5)

## 2021-07-22 NOTE — Telephone Encounter (Signed)
Pt was on cover-my-meds need PA on Rybelsus. Submitted PA w/ (Key: [id number]). Rec'd msg Your information has been submitted and will be reviewed by [place].Marland KitchenJohny Keller

## 2021-07-24 ENCOUNTER — Encounter: Payer: Self-pay | Admitting: Internal Medicine

## 2021-07-24 NOTE — Assessment & Plan Note (Signed)
Likely IBS with diarrhea - for xifaxan x 2 wks, f/u GI as planned

## 2021-07-24 NOTE — Assessment & Plan Note (Signed)
With unable to lose further wt - for rybelsus 3 mg if ok with insurance

## 2021-07-24 NOTE — Assessment & Plan Note (Signed)
Lab Results  Component Value Date   CREATININE 1.02 07/22/2021   Stable overall, cont to avoid nephrotoxins

## 2021-07-24 NOTE — Assessment & Plan Note (Signed)
Also for iron with lab f/u

## 2021-07-24 NOTE — Assessment & Plan Note (Signed)
Age and sex appropriate education and counseling updated with regular exercise and diet Referrals for preventative services - plans to call herself for f/u eye exam soon Immunizations addressed - declines covid booster and pneumovax for now Smoking counseling  - none needed Evidence for depression or other mood disorder - none significant Most recent labs reviewed. I have personally reviewed and have noted: 1) the patient's medical and social history 2) The patient's current medications and supplements 3) The patient's height, weight, and BMI have been recorded in the chart

## 2021-07-24 NOTE — Assessment & Plan Note (Signed)
Lab Results  Component Value Date   LDLCALC 55 07/22/2021   Stable, pt to continue current statin lipitor 10 mg

## 2021-07-24 NOTE — Assessment & Plan Note (Signed)
Mild without vomiting or wt loss - has f/u app with Dr Henrene Pastor July 31, may need repeat EGD and dilation

## 2021-07-24 NOTE — Assessment & Plan Note (Signed)
BP Readings from Last 3 Encounters:  07/21/21 130/68  12/13/20 132/82  12/29/19 (!) 171/94   Stable, pt to continue medical treatment lotensin 20 mg qd, and hct 12.5 mg qd

## 2021-07-25 NOTE — Telephone Encounter (Signed)
Rec;d determination fax med was DENIED. It states  " This request has been denied. This request has been denied. Alternative are Metformin, Bydureon, Byetta, Ozempic, Rybelsus, Trulicity, and Victoza are medically necessary for new start of therapy when all of the following criteria are met (1 and 2): 1. Diagnosis of type 2 diabetes../l,mb

## 2021-07-28 NOTE — Therapy (Signed)
OUTPATIENT PHYSICAL THERAPY TREATMENT NOTE   Patient Name: Jodi Keller MRN: [id number] DOB:03-10-1955, 66 y.o., female Today's Date: 07/29/2021  PCP: Biagio Borg, MD REFERRING PROVIDER:  Fayette Pho, [place]   PT End of Session - 07/29/21 1555     Visit Number 21    Number of Visits 21    Date for PT Re-Evaluation 08/03/21    Authorization Type [employer] / MCR    PT Start Time 1600    PT Stop Time 1645    PT Time Calculation (min) 45 min    Activity Tolerance Patient tolerated treatment well    Behavior During Therapy [facility] for tasks assessed/performed               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   Generalized anxiety disorder 11/17/2014   GERD (gastroesophageal reflux disease)    Hyperlipidemia 02/25/2011   Hypertension    Morbid obesity ([place])    Uterine prolapse    Past Surgical History:  Procedure Laterality Date   BALLOON DILATION N/A 12/29/2019   Procedure: BALLOON DILATION;  Surgeon: Irene Shipper, MD;  Location: [facility];  Service: Endoscopy;  Laterality: N/A;   CESAREAN SECTION     COLONOSCOPY WITH PROPOFOL N/A 12/29/2019   Procedure: COLONOSCOPY WITH PROPOFOL;  Surgeon: Irene Shipper, MD;  Location: [facility];  Service: Endoscopy;  Laterality: N/A;   ESOPHAGOGASTRODUODENOSCOPY (EGD) WITH PROPOFOL N/A 12/29/2019   Procedure: ESOPHAGOGASTRODUODENOSCOPY (EGD) WITH PROPOFOL;  Surgeon: Irene Shipper, MD;  Location: [facility];  Service: Endoscopy;  Laterality: N/A;   EYE SURGERY     cataract surgery per left eye    FOOT SURGERY     Patient Active Problem List   Diagnosis Date Noted   Encounter for well adult exam with abnormal findings 07/21/2021   Diarrhea 07/21/2021   CKD (chronic kidney disease) stage 3, GFR 30-59 ml/min (HCC) 12/14/2020   Esophageal dysphagia    Esophageal stricture    Stress incontinence 12/12/2019   Ganglion cyst of  dorsum of right wrist 09/04/2018   Acne 09/04/2018   Cellulitis 02/01/2017   Biceps muscle tear 01/31/2017   Cough 01/27/2016   Wheezing 01/27/2016   Low back pain 01/27/2016   Left rotator cuff tear 09/17/2015   Left shoulder pain 09/03/2015   Hidradenitis 06/16/2015   Chest pain 06/16/2015   Generalized anxiety disorder 11/17/2014   Gait disorder 11/17/2014   Intertrigo 11/17/2014   Left knee pain 12/17/2013   Left otitis media 07/04/2013   Vertigo 06/26/2012   Allergic rhinitis, cause unspecified 06/26/2012   Eustachian tube dysfunction 06/26/2012   Polycythemia 12/20/2011   Peripheral edema 07/21/2011   Diabetes mellitus type 2, diet-controlled ([place]) 07/21/2011   Hyperlipidemia 02/25/2011   Right leg pain 02/24/2011   Bilateral carpal tunnel syndrome 12/16/2010   Degenerative joint disease of ankle, 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   Colon cancer screening 12/10/2010   FATIGUE 10/30/2007   Morbid obesity ([place]) 11/13/2006   ANEMIA-IRON DEFICIENCY 11/13/2006   Essential hypertension 11/13/2006   GERD 11/13/2006    REFERRING DIAG: Primary osteoarthritis, right ankle and foot  THERAPY DIAG:  Other abnormalities of gait and mobility  Pain in right ankle and joints of right foot  Pain in left ankle and joints of left foot  Muscle weakness (generalized)  PERTINENT HISTORY: Right ankle fusion 2022  PRECAUTIONS: Fall  ONSET DATE: 03/31/2020 (  DOS for right ankle fusion)   SUBJECTIVE: Patient reports that her feet   PAIN:  Are you having pain? No (currently) NPRS scale: 0/10 (3/10 when going from sitting to standing) Pain location: Ankle Pain orientation: Bilateral (left > right) PAIN TYPE: Chronic Pain description: Intermittent, sore Aggravating factors: Walking Relieving factors: Rest  PATIENT GOALS: Improve balance to avoid falls, walk without rollator, be able to get into pool for water aerobics   OBJECTIVE:  PATIENT SURVEYS:  FOTO: 55% functional status - 06/29/2021   (51% functional status - 05/11/2021, 44% functional status - 04/11/2021; 44% at intake)   POSTURE: Patient with rounded shoulders, increased lumbar lordosis with forward trunk lean, knee valgus   LE AROM/PROM: Patient demonstrates gross deficits of bilateral ankle motion, not formally assessed this visit   LE MMT:  Unable to formally assess ankle strength, she does generally demonstrate gross weakness and poor control of ankle/foot   MMT Right 03/16/2021 Left 03/16/2021 Rt / Lt 04/14/2021  Hip flexion _0 / 4  Hip abduction _1 / 4  Knee flexion 5 5   Knee extension 4+ 4+     FUNCTIONAL TESTS:  5 times sit to stand: 11 without hands - 06/29/2021 (11 with hands on thighs - 05/11/2021, 16 seconds with hands on thighs - 04/11/2021; 17 seconds at intake and patient required to use BUE support on armrests to stand)  Timed up and go (TUG): 13 seconds using rollator - 05/11/2021 (17 seconds using rollator at intake)  2 minute walk test: 323 ft using rollator - 06/29/2021  (337 ft using rollator - 05/11/2021, 300 feet using rollator - 04/14/2021)   GAIT: - reassessed 06/29/2021 Distance walked: 323 ft Assistive device utilized: Rollator Level of assistance: Modified independence Comments: patient tends to ambulate on lateral aspect of foot, minimal reliance of rollator with slight forward trunk lean, trendelenburg, minimal heel strike or toe off     TODAY'S TREATMENT: [facility] Adult PT Treatment:                                                DATE: 07/29/2021 Aquatic therapy at [address] - therapeutic pool temp 91 degrees Pt enters building ambulating with rollator.  Treatment took place in water 3.8 to  4 ft 8 in.feet deep depending upon activity.  Pt entered and exited the pool via stair and handrails independently but walks to rails with rollator.  Therapeutic Exercise: Walking forwards/backwards/side stepping Runners stretch x30" BIL Hamstring stretch x30" BIL Figure 4  squat stretch x30" BIL Side lunge walking x2 laps Walking with march x2 laps Step ups forward/lateral 2x10 ea BIL Step up and overs 2x10 BIL Heel/toe raises x20 Squats x20 Hip circles CW/CCW x10 each BIL No UE support to challenge balance: Hip abd/add R/L 20 x each Hip ext/flex with knee straight x 20 BIL Hip flexion, knee extension x20 BIL Seated on pool noodle with 2 yellow dumbbells x1' each: Bicycle kicks Reverse bicycle kicks Scissor kicks Flutter kicks Cross country skiers Standing at [employer]" depth balance exercises: SLS x30" BIL Tandem stance x1' BIL Tandem stance with head turns Tandem stance with perturbation Tandem walking yellow noodle Pt requires the buoyancy of water for active assisted exercises with buoyancy supported for strengthening and AROM exercises. Hydrostatic pressure also supports joints by unweighting joint load by at  least 50 % in 3-4 feet depth water. 80% in chest to neck deep water. Water will provide assistance with movement using the current and laminar flow while the buoyancy reduces weight bearing. Pt requires the viscosity of the water for resistance with strengthening exercises.   [facility] Adult PT Treatment:                                                DATE: 07/20/2021 Therapeutic Exercise: NuStep L7 x 6 min with UE/LE while taking subjective Standing hip abduction with red at ankles 2 x 20 each Standing hip extension with red at ankles 2 x 20 each LAQ with 5# 2 x 20 each Standing alternating march with 5# 2 x 20 Sit to stand holding 10# at chest 2 x 10 Neuromuscular re-ed:  Romberg stance on Airex in // bars x30" Semi tandem on Airex in // bars x30" BIL Modified tandem in // bars 2 x 30 sec Side stepping in // bars 2 x 3 lengths Backward walking in // bars 2 x 3 lengths Rockerboard fwd/bwd and lateral taps 2 x 20 each Gait Training: Walking in // bars without UE support - focus on on arm swing   [facility] Adult PT Treatment:                                                 DATE: 07/13/2021 Therapeutic Exercise: NuStep L7 x 6 min with UE/LE while taking subjective Standing hip abduction with red at ankles 2 x 20 each Standing hip extension with red at ankles 2 x 20 each LAQ with 6# 2 x 20 each Standing alternating march with 6# 2 x 20 Sit to stand holding 10# at chest 2 x 10 Neuromuscular re-ed:  Romberg stance on Airex in // bars 2 x 60 sec Modified tandem in // bars 2 x 30 sec Side stepping in // bars 2 x 3 lengths Backward walking in // bars 2 x 3 lengths Rockerboard fwd/bwd and lateral taps 2 x 20 each Gait Training: Walking in // bars without UE support - focus on on arm swing   PATIENT EDUCATION:  Education details: HEP Person educated: Patient Education method: Consulting civil engineer, Demonstration, Corporate treasurer cues, Verbal cues Education comprehension: verbalized understanding, returned demonstration, verbal cues required, tactile cues required, and needs further education   HOME EXERCISE PROGRAM: Access Code: [email or web address]  Aquatic HEP: [id number]     ASSESSMENT: CLINICAL IMPRESSION: Patient presents to aquatic therapy session with no current pain. Created aquatic HEP as patient plans to attend aquatic center in the future. Session today continued to focus on LE strengthening, general conditioning, and balance training in the aquatic environment for use of buoyancy to offload joints and the viscosity of water as resistance during therapeutic exercise. Patient was able to tolerate all prescribed exercises in the aquatic environment with 45 minutes of consistent exercise with no rest breaks or adverse affects. Patient continues to benefit from skilled PT services on land and aquatic based and should be progressed as able to improve functional independence.   OBJECTIVE IMPAIRMENTS Abnormal gait, decreased balance, decreased endurance, difficulty walking, decreased ROM, decreased strength, impaired sensation, improper body mechanics, postural  dysfunction, and pain.    ACTIVITY LIMITATIONS  cleaning, community activity, meal prep, occupation, laundry, yard work, and shopping.    PERSONAL FACTORS Fitness, Past/current experiences, Time since onset of injury/illness/exacerbation, and 3+ comorbidities: BMI, previous surgical history, HTN, history of anxiety  are also affecting patient's functional outcome.      GOALS: Goals reviewed with patient? Yes   SHORT TERM GOALS:   STG Name Target Date Goal status  1 Patient will be I with initial HEP in order to progress with therapy. Baseline: HEP provided at evaluation 04/14/2021: independent 04/13/2021 MET  2 PT will review FOTO with patient by 3rd visit in order to understand expected progress and outcome with therapy. Baseline: FOTO assessed at evaluation 04/14/2021: reviewed and reassessed 04/13/2021 MET  3 Patient will be able to perform a sit to stand without requiring BUE for assist from armrests to indicate progression with LE strength Baseline: patient requires BUE pushing on armrest in order to stand 04/14/2021: patient able to perform sit to stand with hands on thighs 04/13/2021 MET    LONG TERM GOALS:    LTG Name Target Date Goal status  1 Patient will be I with final HEP to maintain progress from PT. Baseline: HEP provided at evaluation 05/11/2021: 06/29/2021: progressing HEP 08/03/2021 ONGOING  2 Patient will report >/= 70% confidence with walking up/down a ramp via FOTO assessment in order to indicate improve walking and functional ability Baseline: 10% confidence 05/11/2021: 60% confidence 06/29/2021: 70%  07/06/2021 MET  3 Patient will demonstrate 5xSTS in </= 12 seconds to indicate improved strength and reduction of fall risk Baseline: 17 seconds using BUE support on armrests 05/11/2021: 11 seconds but with hands on thighs  06/22/2021: 12 seconds without UE use 06/29/2021: 11 seconds without UE use 07/06/2021 MET  4 Patient will perform TUG in </= 13 seconds to indicate improved  mobility and reduction of fall risk Baseline: 17 seconds using rollator 05/11/2021: 13 seconds using rollator 07/06/2021 MET  5 Patient will exhibit ability to ambulate >/= 546 ft in 2MWT in order to improve community access Baseline: 260 ft using rollator 05/11/2021: 337 ft using rollator 06/29/2021: 323 ft using rollator 08/03/2021 ONGOING      PLAN: PT FREQUENCY: 1x/week   PT DURATION: 8 weeks   PLANNED INTERVENTIONS: Therapeutic exercises, Therapeutic activity, Neuro Muscular re-education, Balance training, Gait training, Patient/Family education, Joint mobilization, Stair training, Aquatic Therapy, Dry Needling, and Manual therapy   PLAN FOR NEXT SESSION:  Review HEP and progress PRN, focus on generalized LE strengthening and balance training, pt requesting high/low table next session, possibly incorporate arm exercises while     Evelene Croon, PTA 07/29/21 3:56 PM

## 2021-07-29 ENCOUNTER — Ambulatory Visit: Payer: Managed Care, Other (non HMO) | Attending: Surgical

## 2021-07-29 DIAGNOSIS — M6281 Muscle weakness (generalized): Secondary | ICD-10-CM | POA: Diagnosis present

## 2021-07-29 DIAGNOSIS — M25572 Pain in left ankle and joints of left foot: Secondary | ICD-10-CM | POA: Diagnosis present

## 2021-07-29 DIAGNOSIS — M25571 Pain in right ankle and joints of right foot: Secondary | ICD-10-CM | POA: Insufficient documentation

## 2021-07-29 DIAGNOSIS — R2689 Other abnormalities of gait and mobility: Secondary | ICD-10-CM | POA: Diagnosis not present

## 2021-08-01 ENCOUNTER — Telehealth: Payer: Self-pay | Admitting: Internal Medicine

## 2021-08-01 NOTE — Telephone Encounter (Signed)
[name] from [facility] is requesting a PA for rifaximin (XIFAXAN) 200 MG tablet. The PA is being requested so that [name] can cover additional quantities of the rx.   [name] is also requesting a PA for Semaglutide (RYBELSUS) 3 MG TABS. The PA is being requested for [employer] to cover this rx.   CB: [id number]

## 2021-08-01 NOTE — Telephone Encounter (Signed)
Rybelsus was denied on 07/22/21. Msg was forward to MD to rx something else.../;lmb   PA for Xifaxan 200 mg submitted w/ Key: [id number]. Rec'd msg PA sent to plan.Marland KitchenJohny Chess

## 2021-08-02 NOTE — Telephone Encounter (Signed)
Rec'd determination fax med Xifaxan was APPROVED. Start Date:08/01/2021; Coverage End Date:09/02/2021;FAXING approval to pof.Marland KitchenJohny Chess

## 2021-08-03 ENCOUNTER — Encounter: Payer: Self-pay | Admitting: Internal Medicine

## 2021-08-03 ENCOUNTER — Encounter: Payer: Self-pay | Admitting: Physical Therapy

## 2021-08-03 ENCOUNTER — Ambulatory Visit: Payer: Managed Care, Other (non HMO) | Admitting: Physical Therapy

## 2021-08-03 ENCOUNTER — Other Ambulatory Visit: Payer: Self-pay

## 2021-08-03 DIAGNOSIS — M25571 Pain in right ankle and joints of right foot: Secondary | ICD-10-CM

## 2021-08-03 DIAGNOSIS — R2689 Other abnormalities of gait and mobility: Secondary | ICD-10-CM | POA: Diagnosis not present

## 2021-08-03 DIAGNOSIS — M6281 Muscle weakness (generalized): Secondary | ICD-10-CM

## 2021-08-03 DIAGNOSIS — M25572 Pain in left ankle and joints of left foot: Secondary | ICD-10-CM

## 2021-08-03 NOTE — Therapy (Signed)
OUTPATIENT PHYSICAL THERAPY TREATMENT NOTE  DISCHARGE   Patient Name: Jodi Keller MRN: [id number] DOB:[birth year], 66 y.o., female Today's Date: 08/03/2021  PCP: Biagio Borg, MD REFERRING PROVIDER:  Fayette Pho, [place]    PT End of Session - 08/03/21 1620     Visit Number 22    Number of Visits 22    Date for PT Re-Evaluation 08/03/21    Authorization Type [employer] / MCR    PT Start Time 1615    PT Stop Time 1700    PT Time Calculation (min) 45 min    Activity Tolerance Patient tolerated treatment well    Behavior During Therapy [facility] for tasks assessed/performed               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   Generalized anxiety disorder 11/17/2014   GERD (gastroesophageal reflux disease)    Hyperlipidemia 02/25/2011   Hypertension    Morbid obesity ([place])    Uterine prolapse    Past Surgical History:  Procedure Laterality Date   BALLOON DILATION N/A 12/29/2019   Procedure: BALLOON DILATION;  Surgeon: Irene Shipper, MD;  Location: [facility];  Service: Endoscopy;  Laterality: N/A;   CESAREAN SECTION     COLONOSCOPY WITH PROPOFOL N/A 12/29/2019   Procedure: COLONOSCOPY WITH PROPOFOL;  Surgeon: Irene Shipper, MD;  [facility];  Service: Endoscopy;  Laterality: N/A;   ESOPHAGOGASTRODUODENOSCOPY (EGD) WITH PROPOFOL N/A 12/29/2019   Procedure: ESOPHAGOGASTRODUODENOSCOPY (EGD) WITH PROPOFOL;  Surgeon: Irene Shipper, MD;  [facility];  Service: Endoscopy;  Laterality: N/A;   EYE SURGERY     cataract surgery per left eye    FOOT SURGERY     Patient Active Problem List   Diagnosis Date Noted   Encounter for well adult exam with abnormal findings 07/21/2021   Diarrhea 07/21/2021   CKD (chronic kidney disease) stage 3, GFR 30-59 ml/min (HCC) 12/14/2020   Esophageal dysphagia    Esophageal stricture    Stress incontinence 12/12/2019    Ganglion cyst of dorsum of right wrist 09/04/2018   Acne 09/04/2018   Cellulitis 02/01/2017   Biceps muscle tear 01/31/2017   Cough 01/27/2016   Wheezing 01/27/2016   Low back pain 01/27/2016   Left rotator cuff tear 09/17/2015   Left shoulder pain 09/03/2015   Hidradenitis 06/16/2015   Chest pain 06/16/2015   Generalized anxiety disorder 11/17/2014   Gait disorder 11/17/2014   Intertrigo 11/17/2014   Left knee pain 12/17/2013   Left otitis media 07/04/2013   Vertigo 06/26/2012   Allergic rhinitis, cause unspecified 06/26/2012   Eustachian tube dysfunction 06/26/2012   Polycythemia 12/20/2011   Peripheral edema 07/21/2011   Diabetes mellitus type 2, diet-controlled ([place]) 07/21/2011   Hyperlipidemia 02/25/2011   Right leg pain 02/24/2011   Bilateral carpal tunnel syndrome 12/16/2010   Degenerative joint disease of ankle, 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   Colon cancer screening 12/10/2010   FATIGUE 10/30/2007   Morbid obesity ([place]) 11/13/2006   ANEMIA-IRON DEFICIENCY 11/13/2006   Essential hypertension 11/13/2006   GERD 11/13/2006    REFERRING DIAG: Primary osteoarthritis, right ankle and foot  THERAPY DIAG:  Other abnormalities of gait and mobility  Pain in right ankle and joints of right foot  Pain in left ankle and joints of left foot  Muscle weakness (generalized)  PERTINENT HISTORY: Right ankle fusion 2022  PRECAUTIONS: Fall  ONSET DATE: 03/31/2020 (DOS for right ankle fusion)   SUBJECTIVE: Patient reports that she continues to do well. She has joined the [facility] and will be going to the pool 3x/week so she can exercise in the pool. She continues to deny any pain, but does report persistent numbness in both feet.  PAIN:  Are you having pain? No (currently) NPRS scale: 0/10 Pain location: Ankle Pain orientation: Bilateral (left > right) PAIN TYPE: Chronic Pain description: Intermittent, sore Aggravating factors: Walking Relieving factors: Rest  PATIENT GOALS: Improve  balance to avoid falls, walk without rollator, be able to get into pool for water aerobics   OBJECTIVE:  PATIENT SURVEYS:  FOTO: 55% functional status - 06/29/2021  (51% functional status - 05/11/2021, 44% functional status - 04/11/2021; 44% at intake)   POSTURE: Patient with rounded shoulders, increased lumbar lordosis with forward trunk lean, knee valgus   LE AROM/PROM: Patient demonstrates gross deficits of bilateral ankle motion, not formally assessed this visit   LE MMT:  Unable to formally assess ankle strength, she does generally demonstrate gross weakness and poor control of ankle/foot   MMT Right 03/16/2021 Left 03/16/2021 Rt / Lt 04/14/2021  Hip flexion '4 4 4 ' / 4  Hip abduction '4 4 4 ' / 4  Knee flexion 5 5   Knee extension 4+ 4+     FUNCTIONAL TESTS:  5 times sit to stand: 11 without hands - 06/29/2021 (11 with hands on thighs - 05/11/2021, 16 seconds with hands on thighs - 04/11/2021; 17 seconds at intake and patient required to use BUE support on armrests to stand)  Timed up and go (TUG): 13 seconds using rollator - 05/11/2021 (17 seconds using rollator at intake)  2 minute walk test: 355 ft using rollator - 08/03/2021 (323 ft using rollator - 06/29/2021, 337 ft using rollator - 05/11/2021, 300 feet using rollator - 04/14/2021)   GAIT: - 08/03/2021 Distance walked: 355 ft Assistive device utilized: Rollator Level of assistance: Modified independence Comments: patient tends to ambulate on lateral aspect of foot, minimal reliance of rollator with slight forward trunk lean, trendelenburg, minimal heel strike or toe off     TODAY'S TREATMENT: [facility] Adult PT Treatment:                                                DATE: 08/03/2021 Therapeutic Exercise: NuStep L7 x 6 min with UE/LE while taking subjective 2MWT using rollator Sit to stand holding 10# 3 x 10 LAQ with 5# 2 x 20 each Standing alternating march with 5# 2 x 20 Standing hip abduction with 5# 2 x 15 each Neuromuscular  re-ed:  Romberg stance on Airex in // bars x 1 min Modified tandem in // bars x 30 sec Forward walking in // bars x 3 lengths Side stepping in // bars x 3 lengths Backward walking in // bars x 3 lengths Rockerboard fwd/bwd and lateral taps x 1 min each   [facility] Adult PT Treatment:                                                DATE: 07/20/2021 Therapeutic Exercise: NuStep L7 x 6 min with UE/LE while taking subjective Standing hip abduction with red at  ankles 2 x 20 each Standing hip extension with red at ankles 2 x 20 each LAQ with 5# 2 x 20 each Standing alternating march with 5# 2 x 20 Sit to stand holding 10# at chest 2 x 10 Neuromuscular re-ed:  Romberg stance on Airex in // bars x30" Semi tandem on Airex in // bars x30" BIL Modified tandem in // bars 2 x 30 sec Side stepping in // bars 2 x 3 lengths Backward walking in // bars 2 x 3 lengths Rockerboard fwd/bwd and lateral taps 2 x 20 each Gait Training: Walking in // bars without UE support - focus on on arm swing  [facility] Adult PT Treatment:                                                DATE: 07/13/2021 Therapeutic Exercise: NuStep L7 x 6 min with UE/LE while taking subjective Standing hip abduction with red at ankles 2 x 20 each Standing hip extension with red at ankles 2 x 20 each LAQ with 6# 2 x 20 each Standing alternating march with 6# 2 x 20 Sit to stand holding 10# at chest 2 x 10 Neuromuscular re-ed:  Romberg stance on Airex in // bars 2 x 60 sec Modified tandem in // bars 2 x 30 sec Side stepping in // bars 2 x 3 lengths Backward walking in // bars 2 x 3 lengths Rockerboard fwd/bwd and lateral taps 2 x 20 each Gait Training: Walking in // bars without UE support - focus on on arm swing  [facility] Adult PT Treatment:                                                DATE: 07/06/2021 Therapeutic Exercise: NuStep L7 x 6 min with UE/LE while taking subjective Standing hip abduction 2 x 20 each Standing hip extension 2 x 20  each Standing alternating march 2 x 20 Knee extension machine 15# 2 x 20 Neuromuscular re-ed: (performed at [facility]) Romberg stance on Airex 3 x 60 sec Side stepping in // bars 2 x 2 lengths Backward walking in // bars 2 x 2 lengths Rockerboard fwd/bwd and lateral taps 2 x 20 each Gait Training: Walking in // bars without UE support Walking with single walking stick in right hand  PATIENT EDUCATION:  Education details: POC discharge, HEP and continuing with aquatic exercises independently Person educated: Patient Education method: Explanation Education comprehension: Verbalized understanding   HOME EXERCISE PROGRAM: Access Code: [email or web address]     ASSESSMENT: CLINICAL IMPRESSION: Patient tolerated therapy well with no adverse effects. She has made great progress with PT, achieving all established goals except her 2MWT which she has improved since evaluation. She feels independent with HEP exercise program and has joined the aquatic center so she can continue her pool based exercises. Patient will be formally discharged from PT.    OBJECTIVE IMPAIRMENTS Abnormal gait, decreased balance, decreased endurance, difficulty walking, decreased ROM, decreased strength, impaired sensation, improper body mechanics, postural dysfunction, and pain.    ACTIVITY LIMITATIONS cleaning, community activity, meal prep, occupation, laundry, yard work, and shopping.    PERSONAL FACTORS Fitness, Past/current experiences, Time since onset of injury/illness/exacerbation, and 3+ comorbidities: BMI, previous surgical history, HTN,  history of anxiety  are also affecting patient's functional outcome.      GOALS: Goals reviewed with patient? Yes   SHORT TERM GOALS:   STG Name Target Date Goal status  1 Patient will be I with initial HEP in order to progress with therapy. Baseline: HEP provided at evaluation 04/14/2021: independent 04/13/2021 MET  2 PT will review FOTO with patient by 3rd visit in order to understand  expected progress and outcome with therapy. Baseline: FOTO assessed at evaluation 04/14/2021: reviewed and reassessed 04/13/2021 MET  3 Patient will be able to perform a sit to stand without requiring BUE for assist from armrests to indicate progression with LE strength Baseline: patient requires BUE pushing on armrest in order to stand 04/14/2021: patient able to perform sit to stand with hands on thighs 04/13/2021 MET    LONG TERM GOALS:    LTG Name Target Date Goal status  1 Patient will be I with final HEP to maintain progress from PT. Baseline: HEP provided at evaluation 05/11/2021: 06/29/2021: progressing HEP 08/03/2021: patient independent with HEP 08/03/2021 MET  2 Patient will report >/= 70% confidence with walking up/down a ramp via FOTO assessment in order to indicate improve walking and functional ability Baseline: 10% confidence 05/11/2021: 60% confidence 06/29/2021: 70%  07/06/2021 MET  3 Patient will demonstrate 5xSTS in </= 12 seconds to indicate improved strength and reduction of fall risk Baseline: 17 seconds using BUE support on armrests 05/11/2021: 11 seconds but with hands on thighs  06/22/2021: 12 seconds without UE use 06/29/2021: 11 seconds without UE use 07/06/2021 MET  4 Patient will perform TUG in </= 13 seconds to indicate improved mobility and reduction of fall risk Baseline: 17 seconds using rollator 05/11/2021: 13 seconds using rollator 07/06/2021 MET  5 Patient will exhibit ability to ambulate >/= 546 ft in 2MWT in order to improve community access Baseline: 260 ft using rollator 05/11/2021: 337 ft using rollator 06/29/2021: 323 ft using rollator 08/03/2021: 355 ft using rollator 08/03/2021 PARTIALLY MET      PLAN: PT FREQUENCY: 1x/week   PT DURATION: 8 weeks   PLANNED INTERVENTIONS: Therapeutic exercises, Therapeutic activity, Neuro Muscular re-education, Balance training, Gait training, Patient/Family education, Joint mobilization, Stair training, Aquatic Therapy, Dry  Needling, and Manual therapy   PLAN FOR NEXT SESSION:  NA - discharge   Hilda Blades, PT, DPT, LAT, ATC 08/03/21  5:00 PM Phone: [phone] Fax: [phone]    PHYSICAL THERAPY DISCHARGE SUMMARY  Visits from Start of Care: 22  Current functional level related to goals / functional outcomes: See above   Remaining deficits: See above   Education / Equipment: HEP   Patient agrees to discharge. Patient goals were partially met. Patient is being discharged due to being pleased with the current functional level.

## 2021-08-10 ENCOUNTER — Telehealth: Payer: Self-pay | Admitting: *Deleted

## 2021-08-10 NOTE — Telephone Encounter (Signed)
Pt was on cover-my-meds need PA on [employer]. Submitted w/ (Key: [id number]. Rec'd msg "  ePA request already received at 2021-08-10 08:20:11.916 with epa ref id [id number] and to call [phone]. Called spoke w/ rep she states July 3rd PA received a denial. The pt must try and fail METFORMIN first. No other alternative given.Marland KitchenJohny Chess

## 2021-08-10 NOTE — Telephone Encounter (Signed)
See previous PA's Xifaxan was approved, but [employer] denied., Msg was sent to provider.Marland KitchenJohny Chess

## 2021-08-15 ENCOUNTER — Encounter: Payer: Self-pay | Admitting: Internal Medicine

## 2021-08-15 LAB — HM DIABETES EYE EXAM

## 2021-08-22 ENCOUNTER — Ambulatory Visit (INDEPENDENT_AMBULATORY_CARE_PROVIDER_SITE_OTHER): Payer: Managed Care, Other (non HMO) | Admitting: Internal Medicine

## 2021-08-22 ENCOUNTER — Encounter: Payer: Self-pay | Admitting: Internal Medicine

## 2021-08-22 VITALS — BP 138/84 | HR 80 | Ht 67.0 in | Wt 307.6 lb

## 2021-08-22 DIAGNOSIS — R110 Nausea: Secondary | ICD-10-CM | POA: Diagnosis not present

## 2021-08-22 DIAGNOSIS — R1319 Other dysphagia: Secondary | ICD-10-CM | POA: Diagnosis not present

## 2021-08-22 DIAGNOSIS — R197 Diarrhea, unspecified: Secondary | ICD-10-CM

## 2021-08-22 DIAGNOSIS — K219 Gastro-esophageal reflux disease without esophagitis: Secondary | ICD-10-CM | POA: Diagnosis not present

## 2021-08-22 MED ORDER — OMEPRAZOLE 20 MG PO CPDR: 20.0000 mg | DELAYED_RELEASE_CAPSULE | Freq: Two times a day (BID) | ORAL | 0 refills | Status: AC

## 2021-08-22 MED ORDER — ONDANSETRON HCL 4 MG PO TABS: 4.0000 mg | ORAL_TABLET | ORAL | 1 refills | Status: DC | PRN

## 2021-08-22 NOTE — Patient Instructions (Signed)
We have sent the following medications to your pharmacy for you to pick up at your convenience: Zofran 4 mg-1 tablet by mouth every 4 hours as needed for severe nausea.  Increase your over the counter omeprazole to 20 mg twice daily.  You have been scheduled for an endoscopy. Please follow written instructions given to you at your visit today. If you use inhalers (even only as needed), please bring them with you on the day of your procedure.  If you are age 59 or older, your body mass index should be between 23-30. Your Body mass index is 48.18 kg/m. If this is out of the aforementioned range listed, please consider follow up with your Primary Care Provider.  If you are age 71 or younger, your body mass index should be between 19-25. Your Body mass index is 48.18 kg/m. If this is out of the aformentioned range listed, please consider follow up with your Primary Care Provider.   ________________________________________________________  The [facility] providers would like to encourage you to use [facility] to communicate with providers for non-urgent requests or questions.  Due to long hold times on the telephone, sending your provider a message by [facility] may be a faster and more efficient way to get a response.  Please allow 48 business hours for a response.  Please remember that this is for non-urgent requests.  _______________________________________________________ Due to recent changes in healthcare laws, you may see the results of your imaging and laboratory studies on MyChart before your provider has had a chance to review them.  We understand that in some cases there may be results that are confusing or concerning to you. Not all laboratory results come back in the same time frame and the provider may be waiting for multiple results in order to interpret others.  Please give [place] 48 hours in order for your provider to thoroughly review all the results before contacting the office for  clarification of your results.

## 2021-08-22 NOTE — Progress Notes (Signed)
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  See that dictation for details.  She subsequently underwent colonoscopy and upper endoscopy December 29, 2019.  Colonoscopy including intubation of the terminal ileum was normal.  Follow-up in 10 years recommended.  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  She presents today after seeing her PCP on July 21, 2021 regarding diarrhea and nausea.  For her diarrhea she was treated with a very short course of Xifaxan (6 days).  Her diarrhea has resolved.  No recurrence.  She reports to me a 6-week history of nausea.  This occurs daily.  May occur at different times of day.  She does not feel like she needs to vomit but describes an unsettled feeling in her stomach.  Makes her not want to eat.  Due to problems with her ankle hospitalization as well as rehab she is lost a significant amount of weight since her last visit (73 pounds).  She does take Prilosec OTC 20 mg daily.  No medication for nausea.  She describes borborygmi and bloating.  Review of blood work from July 22, 2021 reveals normal comprehensive metabolic panel.  Normal CBC with hemoglobin 14.6.  REVIEW OF SYSTEMS:  All non-GI ROS negative unless otherwise stated in the HPI except for arthritis  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   Generalized anxiety disorder 11/17/2014   GERD (gastroesophageal reflux disease)    Hyperlipidemia 02/25/2011   Hypertension    Morbid obesity ([place])    Uterine prolapse     Past Surgical History:  Procedure Laterality Date   ANKLE FUSION Right    BALLOON DILATION N/A 12/29/2019   Procedure: BALLOON  DILATION;  Surgeon: Irene Shipper, MD;  [facility];  Service: Endoscopy;  Laterality: N/A;   CESAREAN SECTION     COLONOSCOPY WITH PROPOFOL N/A 12/29/2019   Procedure: COLONOSCOPY WITH PROPOFOL;  Surgeon: Irene Shipper, MD;  [facility];  Service: Endoscopy;  Laterality: N/A;   ESOPHAGOGASTRODUODENOSCOPY (EGD) WITH PROPOFOL N/A 12/29/2019   Procedure: ESOPHAGOGASTRODUODENOSCOPY (EGD) WITH PROPOFOL;  Surgeon: Irene Shipper, MD;  [facility];  Service: Endoscopy;  Laterality: N/A;   EYE SURGERY     cataract surgery per left eye    FOOT SURGERY      Social History Jodi Keller  reports that she has never smoked. She has never used smokeless tobacco. She reports current alcohol use. She reports that she does not use drugs.  family history includes Cancer in her father, sister, and sister; Dementia in her mother; Diabetes in her maternal grandmother; Thyroid disease in an other family member.  Allergies  Allergen Reactions   Zoster Vac Recomb Adjuvanted Diarrhea and Other (See Comments)    Headache   Zinc Other (See Comments)    redness       PHYSICAL EXAMINATION: Vital signs: BP 138/84   Pulse 80   Ht '5\' 7"'$  (1.702 m)   Wt (!) 307 lb 9.6 oz (139.5 kg)   LMP 01/12/2011   BMI 48.18 kg/m   Constitutional: generally well-appearing, no acute distress Psychiatric: alert and oriented x3, cooperative Eyes: extraocular movements intact,  anicteric, conjunctiva pink Mouth: oral pharynx moist, no lesions Neck: supple no lymphadenopathy Cardiovascular: heart regular rate and rhythm, no murmur Lungs: clear to auscultation bilaterally Abdomen: soft, nontender, nondistended, no obvious ascites, no peritoneal signs, normal bowel sounds, no organomegaly Rectal: Omitted Extremities: no clubbing, cyanosis, or lower extremity edema bilaterally Skin: no lesions on visible extremities Neuro: No focal deficits.  Cranial nerves intact  ASSESSMENT:  1.  Nausea  without vomiting. 2.  Chronic GERD. 3.  Morbid obesity 4.  Dysphagia, recurrent.  Previous EGD revealed esophageal stricture and web which was dilated.  This seemed to help. 5.  Normal colonoscopy December 2021   PLAN:  1.  Reflux precautions with attention to weight loss 2.  Increase omeprazole to 20 mg twice daily 3.  Prescribe Zofran 4 mg every 4 hours as needed for severe nausea.;  #30; 1 refill.  Medication list reviewed 4.  Schedule upper endoscopy with esophageal dilation.  This to evaluate both nausea and dysphagia.  Patient is high risk for her endoscopic procedure given her body habitus.The nature of the procedure, as well as the risks, benefits, and alternatives were carefully and thoroughly reviewed with the patient. Ample time for discussion and questions allowed. The patient understood, was satisfied, and agreed to proceed.  5.  Repeat screening colonoscopy 2031

## 2021-08-31 ENCOUNTER — Encounter: Payer: Self-pay | Admitting: Internal Medicine

## 2021-08-31 ENCOUNTER — Ambulatory Visit (AMBULATORY_SURGERY_CENTER): Payer: Managed Care, Other (non HMO) | Admitting: Internal Medicine

## 2021-08-31 VITALS — BP 105/54 | HR 48 | Temp 97.8°F | Resp 11 | Ht 67.0 in | Wt 307.0 lb

## 2021-08-31 DIAGNOSIS — K222 Esophageal obstruction: Secondary | ICD-10-CM | POA: Diagnosis not present

## 2021-08-31 DIAGNOSIS — R1319 Other dysphagia: Secondary | ICD-10-CM | POA: Diagnosis not present

## 2021-08-31 DIAGNOSIS — K219 Gastro-esophageal reflux disease without esophagitis: Secondary | ICD-10-CM | POA: Diagnosis not present

## 2021-08-31 DIAGNOSIS — R110 Nausea: Secondary | ICD-10-CM

## 2021-08-31 MED ORDER — SODIUM CHLORIDE 0.9 % IV SOLN: 500.0000 mL | Freq: Once | INTRAVENOUS | Status: DC

## 2021-08-31 NOTE — Patient Instructions (Signed)
Handouts on esophagitis and stricture, GERD, and post-dilation diet given.  YOU HAD AN ENDOSCOPIC PROCEDURE TODAY AT [facility]:   Refer to the procedure report that was given to you for any specific questions about what was found during the examination.  If the procedure report does not answer your questions, please call your gastroenterologist to clarify.  If you requested that your care partner not be given the details of your procedure findings, then the procedure report has been included in a sealed envelope for you to review at your convenience later.  YOU SHOULD EXPECT: Some feelings of bloating in the abdomen. Passage of more gas than usual.  Walking can help get rid of the air that was put into your GI tract during the procedure and reduce the bloating. If you had a lower endoscopy (such as a colonoscopy or flexible sigmoidoscopy) you may notice spotting of blood in your stool or on the toilet paper. If you underwent a bowel prep for your procedure, you may not have a normal bowel movement for a few days.  Please Note:  You might notice some irritation and congestion in your nose or some drainage.  This is from the oxygen used during your procedure.  There is no need for concern and it should clear up in a day or so.  SYMPTOMS TO REPORT IMMEDIATELY:  Following upper endoscopy (EGD)  Vomiting of blood or coffee ground material  New chest pain or pain under the shoulder blades  Painful or persistently difficult swallowing  New shortness of breath  Fever of 100F or higher  Black, tarry-looking stools  For urgent or emergent issues, a gastroenterologist can be reached at any hour by calling [phone]. Do not use MyChart messaging for urgent concerns.    DIET:   Post dilation diet.  Nothing by mouth until 4:20pm, clear liquids from 4:20-5:20, then a soft diet after 5:20 for the remainder of the day.  Post dilation diet instructions/handout provided.  You may resume  your prior diet tomorrow.  Drink plenty of fluids but you should avoid alcoholic beverages for 24 hours.  ACTIVITY:  You should plan to take it easy for the rest of today and you should NOT DRIVE or use heavy machinery until tomorrow (because of the sedation medicines used during the test).    FOLLOW UP: Our staff will call the number listed on your records the next business day following your procedure.  We will call around 7:15- 8:00 am to check on you and address any questions or concerns that you may have regarding the information given to you following your procedure. If we do not reach you, we will leave a message.  If you develop any symptoms (ie: fever, flu-like symptoms, shortness of breath, cough etc.) before then, please call [phone].  If you test positive for Covid 19 in the 2 weeks post procedure, please call and report this information to [place].    If any biopsies were taken you will be contacted by phone or by letter within the next 1-3 weeks.  Please call us at [phone] if you have not heard about the biopsies in 3 weeks.    SIGNATURES/CONFIDENTIALITY: You and/or your care partner have signed paperwork which will be entered into your electronic medical record.  These signatures attest to the fact that that the information above on your After Visit Summary has been reviewed and is understood.  Full responsibility of the confidentiality of this discharge information lies  with you and/or your care-partner.  

## 2021-08-31 NOTE — Progress Notes (Signed)
Called to room to assist during endoscopic procedure.  Patient ID and intended procedure confirmed with present staff. Received instructions for my participation in the procedure from the performing physician.  

## 2021-08-31 NOTE — Progress Notes (Signed)
Sedate, gd SR, tolerated procedure well, VSS, report to RN 

## 2021-08-31 NOTE — Progress Notes (Signed)
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  See that dictation for details.  She subsequently underwent colonoscopy and upper endoscopy December 29, 2019.  Colonoscopy including intubation of the terminal ileum was normal.  Follow-up in 10 years recommended.  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  She presents today after seeing her PCP on July 21, 2021 regarding diarrhea and nausea.  For her diarrhea she was treated with a very short course of Xifaxan (6 days).  Her diarrhea has resolved.  No recurrence.  She reports to me a 6-week history of nausea.  This occurs daily.  May occur at different times of day.  She does not feel like she needs to vomit but describes an unsettled feeling in her stomach.  Makes her not want to eat.  Due to problems with her ankle hospitalization as well as rehab she is lost a significant amount of weight since her last visit (73 pounds).  She does take Prilosec OTC 20 mg daily.  No medication for nausea.  She describes borborygmi and bloating.  Review of blood work from July 22, 2021 reveals normal comprehensive metabolic panel.  Normal CBC with hemoglobin 14.6.   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   Generalized anxiety disorder 11/17/2014   GERD (gastroesophageal reflux disease)     Hyperlipidemia 02/25/2011   Hypertension     Morbid obesity ([place])     Uterine prolapse             Past Surgical History:  Procedure Laterality Date   ANKLE FUSION Right     BALLOON DILATION N/A 12/29/2019     Procedure: BALLOON DILATION;  Surgeon: Irene Shipper, MD;  [facility]: [facility];  Service: Endoscopy;  Laterality: N/A;   CESAREAN SECTION       COLONOSCOPY WITH PROPOFOL N/A 12/29/2019    Procedure: COLONOSCOPY WITH PROPOFOL;  Surgeon: Irene Shipper, MD;  [facility];  Service: Endoscopy;  Laterality: N/A;   ESOPHAGOGASTRODUODENOSCOPY (EGD) WITH PROPOFOL N/A 12/29/2019    Procedure: ESOPHAGOGASTRODUODENOSCOPY (EGD) WITH PROPOFOL;  Surgeon: Irene Shipper, MD;  [facility];  Service: Endoscopy;  Laterality: N/A;   EYE SURGERY        cataract surgery per left eye    FOOT SURGERY          Social History Jodi Keller  reports that she has never smoked. She has never used smokeless tobacco. She reports current alcohol use. She reports that she does not use drugs.   family history includes Cancer in her father, sister, and sister; Dementia in her mother; Diabetes in her maternal grandmother; Thyroid disease in an other family member.        Allergies  Allergen Reactions   Zoster Vac Recomb Adjuvanted Diarrhea and Other (See Comments)      Headache   Zinc Other (See Comments)      redness          PHYSICAL EXAMINATION: Vital signs: BP 138/84  Pulse 80   Ht '5\' 7"'$  (1.702 m)   Wt (!) 307 lb 9.6 oz (139.5 kg)   LMP 01/12/2011   BMI 48.18 kg/m   Constitutional: generally well-appearing, no acute distress Psychiatric: alert and oriented x3, cooperative Eyes: extraocular movements intact, anicteric, conjunctiva pink Mouth: oral pharynx moist, no lesions Neck: supple no lymphadenopathy Cardiovascular: heart regular rate and rhythm, no murmur Lungs: clear to auscultation bilaterally Abdomen: soft, nontender, nondistended, no obvious ascites, no peritoneal signs, normal bowel sounds, no organomegaly Rectal: Omitted Extremities: no clubbing, cyanosis, or lower extremity edema bilaterally Skin: no lesions on visible extremities Neuro: No focal deficits.  Cranial  nerves intact   ASSESSMENT:   1.  Nausea without vomiting. 2.  Chronic GERD. 3.  Morbid obesity 4.  Dysphagia, recurrent.  Previous EGD revealed esophageal stricture and web which was dilated.  This seemed to help. 5.  Normal colonoscopy December 2021     PLAN:   1.  Reflux precautions with attention to weight loss 2.  Increase omeprazole to 20 mg twice daily 3.  Prescribe Zofran 4 mg every 4 hours as needed for severe nausea.;  #30; 1 refill.  Medication list reviewed 4.  Schedule upper endoscopy with esophageal dilation.  This to evaluate both nausea and dysphagia.  Patient is high risk for her endoscopic procedure given her body habitus.The nature of the procedure, as well as the risks, benefits, and alternatives were carefully and thoroughly reviewed with the patient. Ample time for discussion and questions allowed. The patient understood, was satisfied, and agreed to proceed.  5.  Repeat screening colonoscopy 2031

## 2021-08-31 NOTE — Progress Notes (Signed)
Pt's states no medical or surgical changes since previsit or office visit. 

## 2021-08-31 NOTE — Op Note (Signed)
[facility] Patient Name: Jodi Keller Procedure Date: 08/31/2021 2:57 PM MRN: [id number] Endoscopist: Docia Chuck. Henrene Pastor , MD Age: 66 Referring MD:  Date of Birth: 12-Sep-1955 Gender: Female Account #: [id number] Procedure:                Upper GI endoscopy with balloon dilation of the                            esophagus. 18-20 mm max Indications:              Dysphagia, Esophageal reflux, Nausea Medicines:                Monitored Anesthesia Care Procedure:                Pre-Anesthesia Assessment:                           - Prior to the procedure, a History and Physical                            was performed, and patient medications and                            allergies were reviewed. The patient's tolerance of                            previous anesthesia was also reviewed. The risks                            and benefits of the procedure and the sedation                            options and risks were discussed with the patient.                            All questions were answered, and informed consent                            was obtained. Prior Anticoagulants: The patient has                            taken no previous anticoagulant or antiplatelet                            agents. ASA Grade Assessment: III - A patient with                            severe systemic disease. After reviewing the risks                            and benefits, the patient was deemed in                            satisfactory condition to undergo the procedure.  After obtaining informed consent, the endoscope was                            passed under direct vision. Throughout the                            procedure, the patient's blood pressure, pulse, and                            oxygen saturations were monitored continuously. The                            Endoscope was introduced through the mouth, and                            advanced to the  second part of duodenum. The upper                            GI endoscopy was accomplished without difficulty.                            The patient tolerated the procedure well. Scope In: Scope Out: Findings:                 One benign-appearing, intrinsic, ringlike, moderate                            stenosis was found. This stenosis measured 1.5 cm                            (inner diameter). The stenosis was traversed. A TTS                            dilator was passed through the scope. Dilation with                            an 18-19-20 mm balloon dilator was performed to 20                            mm. Stricture was disrupted with minimal heme.                           The esophagus was otherwise normal.                           The stomach was normal, save small hiatal hernia.                           The examined duodenum was normal.                           The cardia and gastric fundus were normal on  retroflexion. Complications:            No immediate complications. Estimated Blood Loss:     Estimated blood loss: none. Impression:               1. Esophageal stricture status post dilation                           2. Small hiatal hernia. Otherwise normal EGD                           3. GERD. Recommendation:           - Patient has a contact number available for                            emergencies. The signs and symptoms of potential                            delayed complications were discussed with the                            patient. Return to normal activities tomorrow.                            Written discharge instructions were provided to the                            patient.                           - Post dilation diet.                           - Continue present medications. Docia Chuck. Henrene Pastor, MD 08/31/2021 3:18:29 PM This report has been signed electronically.

## 2021-09-01 ENCOUNTER — Telehealth: Payer: Self-pay

## 2021-09-01 NOTE — Telephone Encounter (Signed)
  Follow up Call-     08/31/2021    2:34 PM  Call back number  Post procedure Call Back phone  # [phone]  Permission to leave phone message Yes     Patient questions:  Do you have a fever, pain , or abdominal swelling? No. Pain Score  0 *  Have you tolerated food without any problems? Yes.    Have you been able to return to your normal activities? Yes.    Do you have any questions about your discharge instructions: Diet   No. Medications  No. Follow up visit  No.  Do you have questions or concerns about your Care? No.  Actions: * If pain score is 4 or above: No action needed, pain <4.

## 2021-09-21 ENCOUNTER — Other Ambulatory Visit: Payer: Self-pay | Admitting: Orthopedic Surgery

## 2021-09-21 DIAGNOSIS — M25511 Pain in right shoulder: Secondary | ICD-10-CM

## 2021-09-29 ENCOUNTER — Ambulatory Visit
Admission: RE | Admit: 2021-09-29 | Discharge: 2021-09-29 | Disposition: A | Discharge status: Home / Self Care | Payer: Medicare Other | Source: Ambulatory Visit | Attending: Orthopedic Surgery | Admitting: Orthopedic Surgery

## 2021-09-29 DIAGNOSIS — M25511 Pain in right shoulder: Secondary | ICD-10-CM

## 2021-09-29 MED ORDER — IOPAMIDOL (ISOVUE-M 300) INJECTION 61%
15.0000 mL | Freq: Once | INTRAMUSCULAR | Status: AC | PRN
  Administered 2021-09-29: 15 mL via INTRA_ARTICULAR

## 2021-10-19 DIAGNOSIS — M12811 Other specific arthropathies, not elsewhere classified, right shoulder: Secondary | ICD-10-CM | POA: Insufficient documentation

## 2021-10-25 ENCOUNTER — Ambulatory Visit (INDEPENDENT_AMBULATORY_CARE_PROVIDER_SITE_OTHER): Payer: Medicare Other | Admitting: Internal Medicine

## 2021-10-25 ENCOUNTER — Encounter: Payer: Self-pay | Admitting: Internal Medicine

## 2021-10-25 VITALS — BP 126/74 | HR 59 | Ht 67.0 in | Wt 312.0 lb

## 2021-10-25 DIAGNOSIS — I10 Essential (primary) hypertension: Secondary | ICD-10-CM

## 2021-10-25 DIAGNOSIS — E119 Type 2 diabetes mellitus without complications: Secondary | ICD-10-CM | POA: Diagnosis not present

## 2021-10-25 DIAGNOSIS — Z01818 Encounter for other preprocedural examination: Secondary | ICD-10-CM | POA: Diagnosis not present

## 2021-10-25 DIAGNOSIS — N1831 Chronic kidney disease, stage 3a: Secondary | ICD-10-CM | POA: Diagnosis not present

## 2021-10-25 NOTE — Progress Notes (Signed)
Patient ID: Jodi Keller, female   DOB: Jan 24, 1956, 66 y.o.   MRN: [id number]        Chief Complaint: preop for right shoulder rot cuff sugury        HPI:  Jodi Keller is a 66 y.o. female here overall improved from June with resolved GI symptoms, after taking some of the xifaxin only.   Pt denies chest pain, increased sob or doe, wheezing, orthopnea, PND, increased LE swelling, palpitations, dizziness or syncope.   Pt denies fever, wt loss, night sweats, loss of appetite, or other constitutional symptoms. No new complaints.  Taking asa 81 mg qd.  Has gained several lbs recently.       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   Generalized anxiety disorder 11/17/2014   GERD (gastroesophageal reflux disease)    Hyperlipidemia 02/25/2011   Hypertension    Morbid obesity ([place])    Uterine prolapse    Past Surgical History:  Procedure Laterality Date   ANKLE FUSION Right    BALLOON DILATION N/A 12/29/2019   Procedure: BALLOON DILATION;  Surgeon: Irene Shipper, MD;  [facility];  Service: Endoscopy;  Laterality: N/A;   CESAREAN SECTION     COLONOSCOPY WITH PROPOFOL N/A 12/29/2019   Procedure: COLONOSCOPY WITH PROPOFOL;  Surgeon: Irene Shipper, MD;  [facility];  Service: Endoscopy;  Laterality: N/A;   ESOPHAGOGASTRODUODENOSCOPY (EGD) WITH PROPOFOL N/A 12/29/2019   Procedure: ESOPHAGOGASTRODUODENOSCOPY (EGD) WITH PROPOFOL;  Surgeon: Irene Shipper, MD;  [facility];  Service: Endoscopy;  Laterality: N/A;   EYE SURGERY     cataract surgery per left eye    FOOT SURGERY      reports that she has never smoked.  She has never used smokeless tobacco. She reports current alcohol use. She reports that she does not use drugs. family history includes Cancer in her father, sister, and sister; Dementia in her mother; Diabetes in her maternal grandmother; Thyroid disease in an other family member. Allergies  Allergen Reactions   Zoster Vac Recomb Adjuvanted Diarrhea and Other (See Comments)    Headache   Zinc Other (See Comments)    redness   Current Outpatient Medications on File Prior to Visit  Medication Sig Dispense Refill   Ascorbic Acid (VITAMIN C PO) Take 1 tablet by mouth at bedtime.     aspirin 81 MG chewable tablet Chew 1 tablet (81 mg total) by mouth daily. (Patient taking differently: Chew 81 mg by mouth at bedtime.) 30 tablet 0   atorvastatin (LIPITOR) 10 MG tablet TAKE 1 TABLET BY MOUTH EVERY DAY 90 tablet 1   b complex vitamins capsule Take 1 capsule by mouth at bedtime.     benazepril (LOTENSIN) 20 MG tablet TAKE 1 TABLET BY MOUTH EVERY DAY 90 tablet 1   CALCIUM PO Take 1 tablet by mouth at bedtime.     cholecalciferol (VITAMIN D) 1000 units tablet Take 1 tablet (1,000 Units total) by mouth daily as needed. Often forgets to take and doesn't know strength 30 tablet 3  clotrimazole-betamethasone (LOTRISONE) cream USE AS DIRECTED TWICE DAILY AS NEEDED (Patient taking differently: Apply 1 application  topically daily as needed (Rash).) 15 g 3   hydrochlorothiazide (MICROZIDE) 12.5 MG capsule TAKE 1 CAPSULE (12.5 MG TOTAL) BY MOUTH AT BEDTIME. 90 capsule 2   Multiple Vitamins-Minerals (MULTIVITAMIN & MINERAL PO) Take 1 tablet by mouth at bedtime.      nystatin cream (MYCOSTATIN) Apply 1 application topically 2 (two) times daily. (Patient taking differently: Apply 1 application  topically 2 (two) times daily as needed.) 30 g 3   nystatin powder Apply 1 application topically 3 (three) times daily. 60 g 3   omeprazole (PRILOSEC) 20 MG capsule Take 1 capsule (20 mg total) by mouth 2 (two) times daily  before a meal. 1 capsule 0   ondansetron (ZOFRAN) 4 MG tablet Take 1 tablet (4 mg total) by mouth every 4 (four) hours as needed (severe nausea). 30 tablet 1   Sulfacetamide Sodium, Acne, 10 % LOTN Apply 1 application topically daily as needed. 118 mL 5   No current facility-administered medications on file prior to visit.        ROS:  All others reviewed and negative.  Objective        PE:  BP 126/74 (BP Location: Right Wrist, Patient Position: Sitting, Cuff Size: Large)   Pulse (!) 59   Ht '5\' 7"'$  (1.702 m)   Wt (!) 312 lb (141.5 kg)   LMP 01/12/2011   SpO2 95%   BMI 48.87 kg/m                 Constitutional: Pt appears in NAD               HENT: Head: NCAT.                Right Ear: External ear normal.                 Left Ear: External ear normal.                Eyes: . Pupils are equal, round, and reactive to light. Conjunctivae and EOM are normal               Nose: without d/c or deformity               Neck: Neck supple. Gross normal ROM               Cardiovascular: Normal rate and regular rhythm.                 Pulmonary/Chest: Effort normal and breath sounds without rales or wheezing.                Abd:  Soft, NT, ND, + BS, no organomegaly               Neurological: Pt is alert. At baseline orientation, motor grossly intact               Skin: Skin is warm. No rashes, no other new lesions, LE edema - trace bilateral               Psychiatric: Pt behavior is normal without agitation   Micro: none  Cardiac tracings I have personally interpreted today:  none  Pertinent Radiological findings (summarize): none   Lab Results  Component Value Date   WBC 6.1 07/22/2021   HGB 14.6 07/22/2021   HCT 44.0 07/22/2021   PLT 275.0 07/22/2021  GLUCOSE 77 07/22/2021   CHOL 130 07/22/2021   TRIG 134.0 07/22/2021   HDL 47.50 07/22/2021   LDLCALC 55 07/22/2021   ALT 14 07/22/2021   AST 18 07/22/2021   NA 137 07/22/2021   K 4.1 07/22/2021   CL 102 07/22/2021    CREATININE 1.02 07/22/2021   BUN 23 07/22/2021   CO2 29 07/22/2021   TSH 3.68 07/22/2021   HGBA1C 5.7 07/22/2021   MICROALBUR 3.8 (H) 07/22/2021   Assessment/Plan:  Jodi Keller is a 65 y.o. White or Caucasian [1] female with  has a past medical history of Anemia, Bilateral carpal tunnel syndrome (12/16/2010), Degenerative joint disease of ankle, left (12/16/2010), Generalized anxiety disorder (11/17/2014), GERD (gastroesophageal reflux disease), Hyperlipidemia (02/25/2011), Hypertension, Morbid obesity ([place]), and Uterine prolapse.  Preop exam for internal medicine Pt cleared for surgury from IM standpoint, to hold asa 81 mg x 7 days prior  Essential hypertension BP Readings from Last 3 Encounters:  10/25/21 126/74  08/31/21 (!) 105/54  08/22/21 138/84   Stable, pt to continue medical treatment lotensin 20 mg qd, hct 12. 5 mg qd    Diabetes mellitus type 2, diet-controlled (HCC) Lab Results  Component Value Date   HGBA1C 5.7 07/22/2021   Stable, pt to continue current medical treatment  - diet, wt control, excercise   CKD (chronic kidney disease) stage 3, GFR 30-59 ml/min (HCC) Lab Results  Component Value Date   CREATININE 1.02 07/22/2021   Stable overall, cont to avoid nephrotoxins  Followup: Return if symptoms worsen or fail to improve.  Cathlean Cower, MD 10/25/2021 7:38 PM [facility]

## 2021-10-25 NOTE — Progress Notes (Signed)
Patient ID: Jodi Keller, female   DOB: 1955/08/01, 66 y.o.   MRN: [id number]

## 2021-10-25 NOTE — Patient Instructions (Signed)
You had the flu shot today  /Please continue all other medications as before, and refills have been done if requested.  Please have the pharmacy call with any other refills you may need.  Please continue your efforts at being more active, low cholesterol diet, and weight control.  You are otherwise up to date with prevention measures today.  Please keep your appointments with your specialists as you may have planned  We will sign and send the Preop form back to Dr Lynnda Child

## 2021-10-25 NOTE — Assessment & Plan Note (Signed)
BP Readings from Last 3 Encounters:  10/25/21 126/74  08/31/21 (!) 105/54  08/22/21 138/84   Stable, pt to continue medical treatment lotensin 20 mg qd, hct 12. 5 mg qd

## 2021-10-25 NOTE — Assessment & Plan Note (Signed)
Lab Results  Component Value Date   HGBA1C 5.7 07/22/2021   Stable, pt to continue current medical treatment  - diet, wt control, excercise

## 2021-10-25 NOTE — Assessment & Plan Note (Signed)
Pt cleared for surgury from IM standpoint, to hold asa 81 mg x 7 days prior

## 2021-10-25 NOTE — Assessment & Plan Note (Signed)
Lab Results  Component Value Date   CREATININE 1.02 07/22/2021   Stable overall, cont to avoid nephrotoxins

## 2021-12-18 ENCOUNTER — Other Ambulatory Visit: Payer: Self-pay | Admitting: Internal Medicine

## 2021-12-18 NOTE — Telephone Encounter (Signed)
Please refill as per office routine med refill policy (all routine meds to be refilled for 3 mo or monthly (per pt preference) up to one year from last visit, then month to month grace period for 3 mo, then further med refills will have to be denied) ? ?

## 2021-12-23 ENCOUNTER — Other Ambulatory Visit: Payer: Self-pay | Admitting: Obstetrics and Gynecology

## 2021-12-23 DIAGNOSIS — Z13820 Encounter for screening for osteoporosis: Secondary | ICD-10-CM

## 2022-01-24 ENCOUNTER — Encounter (HOSPITAL_COMMUNITY): Payer: Self-pay

## 2022-01-24 NOTE — H&P (Signed)
Patient's anticipated LOS is less than 2 midnights, meeting these requirements: - Younger than 36 - Lives within 1 hour of care - Has a competent adult at home to recover with post-op recover - NO history of  - Chronic pain requiring opiods  - Diabetes  - Coronary Artery Disease  - Heart failure  - Heart attack  - Stroke  - DVT/VTE  - Cardiac arrhythmia  - Respiratory Failure/COPD  - Renal failure  - Anemia  - Advanced Liver disease     Jodi Keller is an 67 y.o. female.    Chief Complaint:right shoulder pain  HPI: Pt is a 67 y.o. female complaining of right shoulder pain for multiple years. Pain had continually increased since the beginning. X-rays in the clinic show end-stage arthritic changes of the right shoulder. Pt has tried various conservative treatments which have failed to alleviate their symptoms, including injections and therapy. Various options are discussed with the patient. Risks, benefits and expectations were discussed with the patient. Patient understand the risks, benefits and expectations and wishes to proceed with surgery.   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   Generalized anxiety disorder 11/17/2014   GERD (gastroesophageal reflux disease)    Hyperlipidemia 02/25/2011   Hypertension    Morbid obesity ([place])    Uterine prolapse     PSH: Past Surgical History:  Procedure Laterality Date   ANKLE FUSION Right    BALLOON DILATION N/A 12/29/2019   Procedure: BALLOON DILATION;  Surgeon: Irene Shipper, MD;  [facility];  Service: Endoscopy;  Laterality: N/A;   CESAREAN SECTION     COLONOSCOPY WITH PROPOFOL N/A 12/29/2019   Procedure: COLONOSCOPY WITH PROPOFOL;  Surgeon: Irene Shipper, MD;  [facility];  Service: Endoscopy;  Laterality: N/A;   ESOPHAGOGASTRODUODENOSCOPY  (EGD) WITH PROPOFOL N/A 12/29/2019   Procedure: ESOPHAGOGASTRODUODENOSCOPY (EGD) WITH PROPOFOL;  Surgeon: Irene Shipper, MD;  [facility];  Service: Endoscopy;  Laterality: N/A;   EYE SURGERY     cataract surgery per left eye    FOOT SURGERY      Social History:  reports that she has never smoked. She has never used smokeless tobacco. She reports current alcohol use. She reports that she does not use drugs. BMI: Estimated body mass index is 48.87 kg/m as calculated from the following:   Height as of 10/25/21: '5\' 7"'$  (1.702 m).   Weight as of 10/25/21: 141.5 kg.  Lab Results  Component Value Date   ALBUMIN 3.9 07/22/2021   Diabetes:   Patient has a diagnosis of diabetes,  Lab Results  Component Value Date   HGBA1C 5.7 07/22/2021   Smoking Status:   reports that she has never smoked. She has never used smokeless tobacco.    Allergies:  Allergies  Allergen Reactions   Zoster Vac Recomb Adjuvanted Diarrhea and Other (See Comments)    Headache   Zinc Other (See Comments)    redness    Medications: No current facility-administered medications for this encounter.   Current Outpatient Medications  Medication Sig Dispense Refill   Ascorbic Acid (VITAMIN C PO) Take 1 tablet by mouth at bedtime.     aspirin 81 MG chewable tablet Chew 1 tablet (81 mg total) by mouth daily. (Patient taking differently: Chew 81 mg by mouth at  bedtime.) 30 tablet 0   atorvastatin (LIPITOR) 10 MG tablet TAKE 1 TABLET BY MOUTH EVERY DAY 90 tablet 1   b complex vitamins capsule Take 1 capsule by mouth at bedtime.     benazepril (LOTENSIN) 20 MG tablet TAKE 1 TABLET BY MOUTH EVERY DAY 90 tablet 1   CALCIUM PO Take 1 tablet by mouth at bedtime.     cholecalciferol (VITAMIN D) 1000 units tablet Take 1 tablet (1,000 Units total) by mouth daily as needed. Often forgets to take and doesn't know strength 30 tablet 3   clotrimazole-betamethasone (LOTRISONE) cream USE AS DIRECTED TWICE DAILY AS NEEDED  (Patient taking differently: Apply 1 application  topically daily as needed (Rash).) 15 g 3   hydrochlorothiazide (MICROZIDE) 12.5 MG capsule TAKE 1 CAPSULE (12.5 MG TOTAL) BY MOUTH AT BEDTIME. 90 capsule 2   Multiple Vitamins-Minerals (MULTIVITAMIN & MINERAL PO) Take 1 tablet by mouth at bedtime.      nystatin cream (MYCOSTATIN) Apply 1 application topically 2 (two) times daily. (Patient taking differently: Apply 1 application  topically 2 (two) times daily as needed.) 30 g 3   nystatin powder Apply 1 application topically 3 (three) times daily. 60 g 3   omeprazole (PRILOSEC) 20 MG capsule Take 1 capsule (20 mg total) by mouth 2 (two) times daily before a meal. 1 capsule 0   ondansetron (ZOFRAN) 4 MG tablet Take 1 tablet (4 mg total) by mouth every 4 (four) hours as needed (severe nausea). 30 tablet 1   Sulfacetamide Sodium, Acne, 10 % LOTN Apply 1 application topically daily as needed. 118 mL 5    No results found for this or any previous visit (from the past 48 hour(s)). No results found.  ROS: Pain with rom of the right upper extremity  Physical Exam: Alert and oriented 67 y.o. female in no acute distress Cranial nerves 2-12 intact Cervical spine: full rom with no tenderness, nv intact distally Chest: active breath sounds bilaterally, no wheeze rhonchi or rales Heart: regular rate and rhythm, no murmur Abd: non tender non distended with active bowel sounds Hip is stable with rom  Right shoulder painful and weak rom Nv intact distally No rashes or edema distally  Assessment/Plan Assessment: right shoulder cuff arthropathy  Plan:  Patient will undergo a right reverse total shoulder by Dr. Veverly Fells at [facility] Risks benefits and expectations were discussed with the patient. Patient understand risks, benefits and expectations and wishes to proceed. Preoperative templating of the joint replacement has been completed, documented, and submitted to the Operating Room personnel in order to  optimize intra-operative equipment management.   Merla Riches PA-C, MPAS [facility] is now [facility] [address] [place], [place], [place] [address] Phone: [phone] [email or web address]  [employer]

## 2022-01-24 NOTE — Patient Instructions (Addendum)
SURGICAL WAITING ROOM VISITATION  Patients having surgery or a procedure may have no more than 2 support people in the waiting area - these visitors may rotate.    Children under the age of 45 must have an adult with them who is not the patient.  Due to an increase in RSV and influenza rates and associated hospitalizations, children ages 55 and under may not visit patients in [place].  If the patient needs to stay at the hospital during part of their recovery, the visitor guidelines for inpatient rooms apply. Pre-op nurse will coordinate an appropriate time for 1 support person to accompany patient in pre-op.  This support person may not rotate.    Please refer to the [facility] website for the visitor guidelines for Inpatients (after your surgery is over and you are in a regular room).       Your procedure is scheduled on: 02-10-22   Report to [facility] Main Entrance    Report to admitting at      [place] AM   Call this number if you have problems the morning of surgery [phone]   Do not eat food :After Midnight.   After Midnight you may have the following liquids until __0430____ AM  DAY OF SURGERY  Then nothing by mouth  Water Non-Citrus Juices (without pulp, NO RED-Apple, White grape, White cranberry) Black Coffee (NO MILK/CREAM OR CREAMERS, sugar ok)  Clear Tea (NO MILK/CREAM OR CREAMERS, sugar ok) regular and decaf                             Plain Jell-O (NO RED)                                           Fruit ices (not with fruit pulp, NO RED)                                     Popsicles (NO RED)                                                               Sports drinks like Gatorade (NO RED)                   The day of surgery:  Drink ONE (1) Pre-Surgery Clear Ensure or G2 at   0415 AM the morning of surgery. Drink in one sitting. Do not sip.  This drink was given to you during your hospital  pre-op appointment visit. Nothing else to  drink after completing the  Pre-Surgery  G2.          If you have questions, please contact your surgeon's office.   FOLLOW  ANY ADDITIONAL PRE OP INSTRUCTIONS YOU RECEIVED FROM YOUR SURGEON'S OFFICE!!!     Oral Hygiene is also important to reduce your risk of infection.  Remember - BRUSH YOUR TEETH THE MORNING OF SURGERY WITH YOUR REGULAR TOOTHPASTE  DENTURES WILL BE REMOVED PRIOR TO SURGERY PLEASE DO NOT APPLY "Poly grip" OR ADHESIVES!!!   Do NOT smoke after Midnight   Take these medicines the morning of surgery with A SIP OF WATER: atorvastatin, omeprazole  DO NOT TAKE ANY ORAL DIABETIC MEDICATIONS DAY OF YOUR SURGERY  Bring CPAP mask and tubing day of surgery.                              You may not have any metal on your body including hair pins, jewelry, and body piercing             Do not wear make-up, lotions, powders, perfumes/cologne, or deodorant  Do not wear nail polish including gel and S&S, artificial/acrylic nails, or any other type of covering on natural nails including finger and toenails. If you have artificial nails, gel coating, etc. that needs to be removed by a nail salon please have this removed prior to surgery or surgery may need to be canceled/ delayed if the surgeon/ anesthesia feels like they are unable to be safely monitored.   Do not shave  48 hours prior to surgery.             Do not bring valuables to the hospital. [place].   Contacts, glasses, dentures or bridgework may not be worn into surgery.   Bring small overnight bag day of surgery.   DO NOT [place]. PHARMACY WILL DISPENSE MEDICATIONS LISTED ON YOUR MEDICATION LIST TO YOU DURING YOUR ADMISSION [place]!    Patients discharged on the day of surgery will not be allowed to drive home.  Someone NEEDS to stay with you for the first 24 hours after  anesthesia.                 Please read over the following fact sheets you were given: [place] [phone]   If you received a COVID test during your pre-op visit  it is requested that you wear a mask when out in public, stay away from anyone that may not be feeling well and notify your surgeon if you develop symptoms. If you test positive for Covid or have been in contact with anyone that has tested positive in the last 10 days please notify you surgeon.    [place]- Preparing for Total Shoulder Arthroplasty    Before surgery, you can play an important role. Because skin is not sterile, your skin needs to be as free of germs as possible. You can reduce the number of germs on your skin by using the following products. Benzoyl Peroxide Gel Reduces the number of germs present on the skin Applied twice a day to shoulder area starting two days before surgery    ==================================================================  Please follow these instructions carefully:  BENZOYL PEROXIDE 5% GEL  Please do not use if you have an allergy to benzoyl peroxide.   If your skin becomes reddened/irritated stop using the benzoyl peroxide.  Starting two days before surgery, apply as follows: Apply benzoyl peroxide in the morning and at night. Apply after taking a shower. If you are not taking a shower clean entire shoulder front, back, and  side along with the armpit with a clean wet washcloth.  Place a quarter-sized dollop on your shoulder and rub in thoroughly, making sure to cover the front, back, and side of your shoulder, along with the armpit.   2 days before ____ AM   ____ PM              1 day before ____ AM   ____ PM                         Do this twice a day for two days.  (Last application is the night before surgery, AFTER using the CHG soap as described below).  Do NOT apply benzoyl peroxide gel on the day of  surgery.       [place] - Preparing for Surgery Before surgery, you can play an important role.  Because skin is not sterile, your skin needs to be as free of germs as possible.  You can reduce the number of germs on your skin by washing with CHG (chlorahexidine gluconate) soap before surgery.  CHG is an antiseptic cleaner which kills germs and bonds with the skin to continue killing germs even after washing. Please DO NOT use if you have an allergy to CHG or antibacterial soaps.  If your skin becomes reddened/irritated stop using the CHG and inform your nurse when you arrive at Short Stay. Do not shave (including legs and underarms) for at least 48 hours prior to the first CHG shower.  You may shave your face/neck. Please follow these instructions carefully:  1.  Shower with CHG Soap the night before surgery and the  morning of Surgery.  2.  If you choose to wash your hair, wash your hair first as usual with your  normal  shampoo.  3.  After you shampoo, rinse your hair and body thoroughly to remove the  shampoo.                           4.  Use CHG as you would any other liquid soap.  You can apply chg directly  to the skin and wash                       Gently with a scrungie or clean washcloth.  5.  Apply the CHG Soap to your body ONLY FROM THE NECK DOWN.   Do not use on face/ open                           Wound or open sores. Avoid contact with eyes, ears mouth and genitals (private parts).                       Wash face,  Genitals (private parts) with your normal soap.             6.  Wash thoroughly, paying special attention to the area where your surgery  will be performed.  7.  Thoroughly rinse your body with warm water from the neck down.  8.  DO NOT shower/wash with your normal soap after using and rinsing off  the CHG Soap.                9.  Pat yourself dry with a clean towel.            10.  Wear clean pajamas.            11.  Place clean sheets on your bed the night of  your first shower and do not  sleep with pets. Day of Surgery : Do not apply any lotions/deodorants the morning of surgery.  Please wear clean clothes to the hospital/surgery center.  FAILURE TO FOLLOW THESE INSTRUCTIONS MAY RESULT IN THE CANCELLATION OF YOUR SURGERY PATIENT SIGNATURE_________________________________  NURSE SIGNATURE__________________________________  ________________________________________________________________________  Adam Phenix  An incentive spirometer is a tool that can help keep your lungs clear and active. This tool measures how well you are filling your lungs with each breath. Taking long deep breaths may help reverse or decrease the chance of developing breathing (pulmonary) problems (especially infection) following: A long period of time when you are unable to move or be active. BEFORE THE PROCEDURE  If the spirometer includes an indicator to show your best effort, your nurse or respiratory therapist will set it to a desired goal. If possible, sit up straight or lean slightly forward. Try not to slouch. Hold the incentive spirometer in an upright position. INSTRUCTIONS FOR USE  Sit on the edge of your bed if possible, or sit up as far as you can in bed or on a chair. Hold the incentive spirometer in an upright position. Breathe out normally. Place the mouthpiece in your mouth and seal your lips tightly around it. Breathe in slowly and as deeply as possible, raising the piston or the ball toward the top of the column. Hold your breath for 3-5 seconds or for as long as possible. Allow the piston or ball to fall to the bottom of the column. Remove the mouthpiece from your mouth and breathe out normally. Rest for a few seconds and repeat Steps 1 through 7 at least 10 times every 1-2 hours when you are awake. Take your time and take a few normal breaths between deep breaths. The spirometer may include an indicator to show your best effort. Use the  indicator as a goal to work toward during each repetition. After each set of 10 deep breaths, practice coughing to be sure your lungs are clear. If you have an incision (the cut made at the time of surgery), support your incision when coughing by placing a pillow or rolled up towels firmly against it. Once you are able to get out of bed, walk around indoors and cough well. You may stop using the incentive spirometer when instructed by your caregiver.  RISKS AND COMPLICATIONS Take your time so you do not get dizzy or light-headed. If you are in pain, you may need to take or ask for pain medication before doing incentive spirometry. It is harder to take a deep breath if you are having pain. AFTER USE Rest and breathe slowly and easily. It can be helpful to keep track of a log of your progress. Your caregiver can provide you with a simple table to help with this. If you are using the spirometer at home, follow these instructions: [place] IF:  You are having difficultly using the spirometer. You have trouble using the spirometer as often as instructed. Your pain medication is not giving enough relief while using the spirometer. You develop fever of 100.5 F (38.1 C) or higher. SEEK IMMEDIATE MEDICAL CARE IF:  You cough up bloody sputum that had not been present before. You develop fever of 102 F (38.9 C) or greater. You develop worsening pain at or near the incision  site. MAKE SURE YOU:  Understand these instructions. Will watch your condition. Will get help right away if you are not doing well or get worse. Document Released: 05/22/2006 Document Revised: 04/03/2011 Document Reviewed: 07/23/2006 [facility] Patient Information 2014 [place], [place].   ________________________________________________________________________

## 2022-01-24 NOTE — Progress Notes (Addendum)
PCP - Waynard Edwards 10-25-21 LOV preop eval Cardiologist -  Pulm- Clearance 12-05-21 Dr. Ramonita Lab  PPM/ICD -  Device Orders -  Rep Notified -   Chest x-ray -  EKG - 01-30-22 Stress Test -  ECHO - 2013 Cardiac Cath -   Sleep Study -  CPAP -   Fasting Blood Sugar -  Checks Blood Sugar _____ times a day  Blood Thinner Instructions: Aspirin Instructions:  ERAS Protcol - PRE-SURGERY G2-   COVID TEST-  COVID vaccine -yes  Activity--Able to go to aquatic center 3 x a week with no SOB Anesthesia review: OSA cpap, HTN  Patient denies shortness of breath, fever, cough and chest pain at PAT appointment   All instructions explained to the patient, with a verbal understanding of the material. Patient agrees to go over the instructions while at home for a better understanding. Patient also instructed to self quarantine after being tested for COVID-19. The opportunity to ask questions was provided.

## 2022-01-27 DIAGNOSIS — M25532 Pain in left wrist: Secondary | ICD-10-CM | POA: Diagnosis not present

## 2022-01-30 ENCOUNTER — Encounter (HOSPITAL_COMMUNITY): Payer: Self-pay

## 2022-01-30 ENCOUNTER — Encounter (HOSPITAL_COMMUNITY)
Admission: RE | Admit: 2022-01-30 | Discharge: 2022-01-30 | Disposition: A | Discharge status: Home / Self Care | Payer: Medicare Other | Source: Ambulatory Visit | Attending: Orthopedic Surgery | Admitting: Orthopedic Surgery

## 2022-01-30 ENCOUNTER — Other Ambulatory Visit: Payer: Self-pay

## 2022-01-30 VITALS — BP 103/60 | HR 66 | Temp 98.2°F | Resp 16 | Ht 67.0 in | Wt 317.0 lb

## 2022-01-30 DIAGNOSIS — I10 Essential (primary) hypertension: Secondary | ICD-10-CM | POA: Diagnosis not present

## 2022-01-30 DIAGNOSIS — E119 Type 2 diabetes mellitus without complications: Secondary | ICD-10-CM | POA: Diagnosis not present

## 2022-01-30 DIAGNOSIS — Z01818 Encounter for other preprocedural examination: Secondary | ICD-10-CM | POA: Diagnosis not present

## 2022-01-30 HISTORY — DX: Personal history of other diseases of the digestive system: Z87.19

## 2022-01-30 HISTORY — DX: Sleep apnea, unspecified: G47.30

## 2022-01-30 HISTORY — DX: Type 2 diabetes mellitus without complications: E11.9

## 2022-01-30 LAB — CBC
HCT: 45.5 % (ref 36.0–46.0)
Hemoglobin: 14.9 g/dL (ref 12.0–15.0)
MCH: 30.9 pg (ref 26.0–34.0)
MCHC: 32.7 g/dL (ref 30.0–36.0)
MCV: 94.4 fL (ref 80.0–100.0)
Platelets: 221 10*3/uL (ref 150–400)
RBC: 4.82 MIL/uL (ref 3.87–5.11)
RDW: 13.8 % (ref 11.5–15.5)
WBC: 10.8 10*3/uL — ABNORMAL HIGH (ref 4.0–10.5)
nRBC: 0 % (ref 0.0–0.2)

## 2022-01-30 LAB — BASIC METABOLIC PANEL
Anion gap: 10 (ref 5–15)
BUN: 23 mg/dL (ref 8–23)
CO2: 22 mmol/L (ref 22–32)
Calcium: 8.8 mg/dL — ABNORMAL LOW (ref 8.9–10.3)
Chloride: 107 mmol/L (ref 98–111)
Creatinine, Ser: 1.08 mg/dL — ABNORMAL HIGH (ref 0.44–1.00)
GFR, Estimated: 57 mL/min — ABNORMAL LOW (ref >60)
Glucose, Bld: 120 mg/dL — ABNORMAL HIGH (ref 70–99)
Potassium: 4 mmol/L (ref 3.5–5.1)
Sodium: 139 mmol/L (ref 135–145)

## 2022-01-30 LAB — GLUCOSE, CAPILLARY: Glucose-Capillary: 115 mg/dL — ABNORMAL HIGH (ref 70–99)

## 2022-01-30 LAB — SURGICAL PCR SCREEN
MRSA, PCR: NEGATIVE
Staphylococcus aureus: NEGATIVE

## 2022-01-31 LAB — HEMOGLOBIN A1C
Hgb A1c MFr Bld: 8.3 % — ABNORMAL HIGH (ref 4.8–5.6)
Mean Plasma Glucose: 192 mg/dL

## 2022-01-31 NOTE — Progress Notes (Signed)
Labs results: A1C: 8.3

## 2022-02-06 DIAGNOSIS — Z8742 Personal history of other diseases of the female genital tract: Secondary | ICD-10-CM | POA: Diagnosis not present

## 2022-02-06 DIAGNOSIS — R9389 Abnormal findings on diagnostic imaging of other specified body structures: Secondary | ICD-10-CM | POA: Diagnosis not present

## 2022-02-10 ENCOUNTER — Observation Stay (HOSPITAL_COMMUNITY): Payer: Medicare Other

## 2022-02-10 ENCOUNTER — Ambulatory Visit (HOSPITAL_BASED_OUTPATIENT_CLINIC_OR_DEPARTMENT_OTHER): Payer: Medicare Other | Admitting: Anesthesiology

## 2022-02-10 ENCOUNTER — Ambulatory Visit (HOSPITAL_COMMUNITY): Payer: Medicare Other | Admitting: Anesthesiology

## 2022-02-10 ENCOUNTER — Encounter (HOSPITAL_COMMUNITY): Payer: Self-pay | Admitting: Orthopedic Surgery

## 2022-02-10 ENCOUNTER — Encounter (HOSPITAL_COMMUNITY)
Admission: RE | Disposition: A | Discharge status: Home / Self Care | Payer: Self-pay | Source: Ambulatory Visit | Attending: Orthopedic Surgery

## 2022-02-10 ENCOUNTER — Other Ambulatory Visit: Payer: Self-pay

## 2022-02-10 ENCOUNTER — Observation Stay (HOSPITAL_COMMUNITY)
Admission: RE | Admit: 2022-02-10 | Discharge: 2022-02-14 | Disposition: A | Discharge status: Home / Self Care | Payer: Medicare Other | Source: Ambulatory Visit | Attending: Orthopedic Surgery | Admitting: Orthopedic Surgery

## 2022-02-10 DIAGNOSIS — G4733 Obstructive sleep apnea (adult) (pediatric): Secondary | ICD-10-CM | POA: Diagnosis not present

## 2022-02-10 DIAGNOSIS — Z9989 Dependence on other enabling machines and devices: Secondary | ICD-10-CM | POA: Diagnosis not present

## 2022-02-10 DIAGNOSIS — Z79899 Other long term (current) drug therapy: Secondary | ICD-10-CM | POA: Diagnosis not present

## 2022-02-10 DIAGNOSIS — Z7982 Long term (current) use of aspirin: Secondary | ICD-10-CM | POA: Insufficient documentation

## 2022-02-10 DIAGNOSIS — Z471 Aftercare following joint replacement surgery: Secondary | ICD-10-CM | POA: Diagnosis not present

## 2022-02-10 DIAGNOSIS — M75111 Incomplete rotator cuff tear or rupture of right shoulder, not specified as traumatic: Secondary | ICD-10-CM | POA: Diagnosis not present

## 2022-02-10 DIAGNOSIS — E119 Type 2 diabetes mellitus without complications: Secondary | ICD-10-CM

## 2022-02-10 DIAGNOSIS — M19011 Primary osteoarthritis, right shoulder: Secondary | ICD-10-CM | POA: Diagnosis not present

## 2022-02-10 DIAGNOSIS — M75101 Unspecified rotator cuff tear or rupture of right shoulder, not specified as traumatic: Principal | ICD-10-CM | POA: Insufficient documentation

## 2022-02-10 DIAGNOSIS — Z96611 Presence of right artificial shoulder joint: Secondary | ICD-10-CM

## 2022-02-10 DIAGNOSIS — G8918 Other acute postprocedural pain: Secondary | ICD-10-CM | POA: Diagnosis not present

## 2022-02-10 DIAGNOSIS — I10 Essential (primary) hypertension: Secondary | ICD-10-CM | POA: Insufficient documentation

## 2022-02-10 DIAGNOSIS — M12811 Other specific arthropathies, not elsewhere classified, right shoulder: Secondary | ICD-10-CM | POA: Diagnosis not present

## 2022-02-10 HISTORY — PX: REVERSE SHOULDER ARTHROPLASTY: SHX5054

## 2022-02-10 LAB — GLUCOSE, CAPILLARY: Glucose-Capillary: 138 mg/dL — ABNORMAL HIGH (ref 70–99)

## 2022-02-10 SURGERY — ARTHROPLASTY, SHOULDER, TOTAL, REVERSE
Anesthesia: Regional | Site: Shoulder | Laterality: Right

## 2022-02-10 MED ORDER — BUPIVACAINE HCL (PF) 0.25 % IJ SOLN
INTRAMUSCULAR | Status: DC | PRN
  Administered 2022-02-10: 12 mL

## 2022-02-10 MED ORDER — MIDAZOLAM HCL 2 MG/2ML IJ SOLN
INTRAMUSCULAR | Status: DC | PRN
  Administered 2022-02-10: 2 mg via INTRAVENOUS

## 2022-02-10 MED ORDER — MIDAZOLAM HCL 2 MG/2ML IJ SOLN
INTRAMUSCULAR | Status: AC
  Filled 2022-02-10: qty 2

## 2022-02-10 MED ORDER — SUGAMMADEX SODIUM 200 MG/2ML IV SOLN
INTRAVENOUS | Status: DC | PRN
  Administered 2022-02-10: 200 mg via INTRAVENOUS

## 2022-02-10 MED ORDER — DEXAMETHASONE SODIUM PHOSPHATE 10 MG/ML IJ SOLN
INTRAMUSCULAR | Status: AC
  Filled 2022-02-10: qty 1

## 2022-02-10 MED ORDER — ROCURONIUM BROMIDE 10 MG/ML (PF) SYRINGE
PREFILLED_SYRINGE | INTRAVENOUS | Status: AC
  Filled 2022-02-10: qty 10

## 2022-02-10 MED ORDER — MENTHOL 3 MG MT LOZG: 1.0000 | LOZENGE | OROMUCOSAL | Status: DC | PRN

## 2022-02-10 MED ORDER — CHLORHEXIDINE GLUCONATE 0.12 % MT SOLN
15.0000 mL | Freq: Once | OROMUCOSAL | Status: AC
  Administered 2022-02-10: 15 mL via OROMUCOSAL

## 2022-02-10 MED ORDER — SODIUM CHLORIDE 0.9 % IR SOLN
Status: DC | PRN
  Administered 2022-02-10: 1000 mL

## 2022-02-10 MED ORDER — NYSTATIN 100000 UNIT/GM EX CREA: 1.0000 | TOPICAL_CREAM | Freq: Two times a day (BID) | CUTANEOUS | Status: DC

## 2022-02-10 MED ORDER — DOCUSATE SODIUM 100 MG PO CAPS
100.0000 mg | ORAL_CAPSULE | Freq: Two times a day (BID) | ORAL | Status: DC
  Administered 2022-02-10 – 2022-02-14 (×7): 100 mg via ORAL
  Filled 2022-02-10 (×7): qty 1

## 2022-02-10 MED ORDER — VITAMIN C 500 MG PO TABS
1000.0000 mg | ORAL_TABLET | Freq: Every day | ORAL | Status: DC
  Administered 2022-02-10 – 2022-02-13 (×4): 1000 mg via ORAL
  Filled 2022-02-10 (×4): qty 2

## 2022-02-10 MED ORDER — POLYETHYLENE GLYCOL 3350 17 G PO PACK
17.0000 g | PACK | Freq: Every day | ORAL | Status: DC | PRN
  Administered 2022-02-12: 17 g via ORAL
  Filled 2022-02-10: qty 1

## 2022-02-10 MED ORDER — TRANEXAMIC ACID-NACL 1000-0.7 MG/100ML-% IV SOLN
INTRAVENOUS | Status: AC
  Filled 2022-02-10: qty 100

## 2022-02-10 MED ORDER — ONDANSETRON HCL 4 MG PO TABS: 4.0000 mg | ORAL_TABLET | Freq: Three times a day (TID) | ORAL | 1 refills | Status: DC | PRN

## 2022-02-10 MED ORDER — BUPIVACAINE LIPOSOME 1.3 % IJ SUSP
INTRAMUSCULAR | Status: DC | PRN
  Administered 2022-02-10: 10 mL via PERINEURAL

## 2022-02-10 MED ORDER — ONDANSETRON HCL 4 MG/2ML IJ SOLN
INTRAMUSCULAR | Status: AC
  Administered 2022-02-10: 4 mg via INTRAVENOUS
  Filled 2022-02-10: qty 2

## 2022-02-10 MED ORDER — METHOCARBAMOL 500 MG PO TABS
500.0000 mg | ORAL_TABLET | Freq: Four times a day (QID) | ORAL | Status: DC | PRN
  Administered 2022-02-10 – 2022-02-13 (×6): 500 mg via ORAL
  Filled 2022-02-10 (×6): qty 1

## 2022-02-10 MED ORDER — ADULT MULTIVITAMIN W/MINERALS CH
1.0000 | ORAL_TABLET | Freq: Every day | ORAL | Status: DC
  Administered 2022-02-10 – 2022-02-13 (×4): 1 via ORAL
  Filled 2022-02-10 (×4): qty 1

## 2022-02-10 MED ORDER — ASPIRIN 81 MG PO CHEW
81.0000 mg | CHEWABLE_TABLET | Freq: Every day | ORAL | Status: DC
  Administered 2022-02-11 – 2022-02-14 (×4): 81 mg via ORAL
  Filled 2022-02-10 (×4): qty 1

## 2022-02-10 MED ORDER — PHENYLEPHRINE HCL (PRESSORS) 10 MG/ML IV SOLN
INTRAVENOUS | Status: AC
  Filled 2022-02-10: qty 1

## 2022-02-10 MED ORDER — TRANEXAMIC ACID-NACL 1000-0.7 MG/100ML-% IV SOLN
INTRAVENOUS | Status: DC | PRN
  Administered 2022-02-10: 1000 mg via INTRAVENOUS

## 2022-02-10 MED ORDER — METHOCARBAMOL 500 MG IVPB - SIMPLE MED
INTRAVENOUS | Status: AC
  Administered 2022-02-10: 500 mg via INTRAVENOUS
  Filled 2022-02-10: qty 55

## 2022-02-10 MED ORDER — ONDANSETRON HCL 4 MG PO TABS
4.0000 mg | ORAL_TABLET | Freq: Four times a day (QID) | ORAL | Status: DC | PRN
  Filled 2022-02-10: qty 1

## 2022-02-10 MED ORDER — ROCURONIUM BROMIDE 10 MG/ML (PF) SYRINGE
PREFILLED_SYRINGE | INTRAVENOUS | Status: DC | PRN
  Administered 2022-02-10: 60 mg via INTRAVENOUS

## 2022-02-10 MED ORDER — LIDOCAINE HCL (PF) 2 % IJ SOLN
INTRAMUSCULAR | Status: AC
  Filled 2022-02-10: qty 5

## 2022-02-10 MED ORDER — PROPOFOL 10 MG/ML IV BOLUS
INTRAVENOUS | Status: DC | PRN
  Administered 2022-02-10: 150 mg via INTRAVENOUS

## 2022-02-10 MED ORDER — SODIUM CHLORIDE 0.9 % IV SOLN: INTRAVENOUS | Status: DC

## 2022-02-10 MED ORDER — FENTANYL CITRATE PF 50 MCG/ML IJ SOSY
PREFILLED_SYRINGE | INTRAMUSCULAR | Status: AC
  Administered 2022-02-10: 50 ug via INTRAVENOUS
  Filled 2022-02-10: qty 2

## 2022-02-10 MED ORDER — STERILE WATER FOR IRRIGATION IR SOLN
Status: DC | PRN
  Administered 2022-02-10: 2000 mL

## 2022-02-10 MED ORDER — FENTANYL CITRATE PF 50 MCG/ML IJ SOSY
25.0000 ug | PREFILLED_SYRINGE | INTRAMUSCULAR | Status: DC | PRN
  Administered 2022-02-10: 50 ug via INTRAVENOUS

## 2022-02-10 MED ORDER — METOCLOPRAMIDE HCL 5 MG PO TABS: 5.0000 mg | ORAL_TABLET | Freq: Three times a day (TID) | ORAL | Status: DC | PRN

## 2022-02-10 MED ORDER — ONDANSETRON HCL 4 MG PO TABS: 4.0000 mg | ORAL_TABLET | ORAL | Status: DC | PRN

## 2022-02-10 MED ORDER — ACETAMINOPHEN 325 MG PO TABS
325.0000 mg | ORAL_TABLET | Freq: Four times a day (QID) | ORAL | Status: DC | PRN
  Administered 2022-02-10 – 2022-02-12 (×3): 650 mg via ORAL
  Filled 2022-02-10 (×3): qty 2

## 2022-02-10 MED ORDER — CLOTRIMAZOLE 1 % EX CREA: TOPICAL_CREAM | Freq: Two times a day (BID) | CUTANEOUS | Status: DC

## 2022-02-10 MED ORDER — PHENOL 1.4 % MT LIQD
1.0000 | OROMUCOSAL | Status: DC | PRN
  Administered 2022-02-10: 1 via OROMUCOSAL
  Filled 2022-02-10: qty 177

## 2022-02-10 MED ORDER — MELOXICAM 15 MG PO TABS
15.0000 mg | ORAL_TABLET | Freq: Every day | ORAL | Status: DC
  Administered 2022-02-11 – 2022-02-14 (×4): 15 mg via ORAL
  Filled 2022-02-10 (×4): qty 1

## 2022-02-10 MED ORDER — MORPHINE SULFATE (PF) 2 MG/ML IV SOLN: 0.5000 mg | INTRAVENOUS | Status: DC | PRN

## 2022-02-10 MED ORDER — TRANEXAMIC ACID-NACL 1000-0.7 MG/100ML-% IV SOLN: 1000.0000 mg | Freq: Once | INTRAVENOUS | Status: DC

## 2022-02-10 MED ORDER — BUPIVACAINE HCL 0.25 % IJ SOLN
INTRAMUSCULAR | Status: AC
  Filled 2022-02-10: qty 1

## 2022-02-10 MED ORDER — BUPIVACAINE HCL (PF) 0.25 % IJ SOLN
INTRAMUSCULAR | Status: AC
  Filled 2022-02-10: qty 30

## 2022-02-10 MED ORDER — FENTANYL CITRATE PF 50 MCG/ML IJ SOSY
PREFILLED_SYRINGE | INTRAMUSCULAR | Status: AC
  Administered 2022-02-10: 50 ug via INTRAVENOUS
  Filled 2022-02-10: qty 1

## 2022-02-10 MED ORDER — HYDROCHLOROTHIAZIDE 12.5 MG PO TABS
12.5000 mg | ORAL_TABLET | Freq: Every day | ORAL | Status: DC
  Administered 2022-02-11 – 2022-02-13 (×3): 12.5 mg via ORAL
  Filled 2022-02-10 (×4): qty 1

## 2022-02-10 MED ORDER — DICLOFENAC SODIUM 75 MG PO TBEC: 75.0000 mg | DELAYED_RELEASE_TABLET | Freq: Two times a day (BID) | ORAL | Status: DC

## 2022-02-10 MED ORDER — SULFACETAMIDE SODIUM (ACNE) 10 % EX LOTN: 1.0000 "application " | TOPICAL_LOTION | Freq: Every day | CUTANEOUS | Status: DC | PRN

## 2022-02-10 MED ORDER — CALCIUM CARBONATE 1250 (500 CA) MG PO TABS
1.0000 | ORAL_TABLET | Freq: Every day | ORAL | Status: DC
  Administered 2022-02-10 – 2022-02-13 (×4): 1250 mg via ORAL
  Filled 2022-02-10 (×4): qty 1

## 2022-02-10 MED ORDER — DEXAMETHASONE SODIUM PHOSPHATE 10 MG/ML IJ SOLN
INTRAMUSCULAR | Status: DC | PRN
  Administered 2022-02-10: 10 mg via INTRAVENOUS

## 2022-02-10 MED ORDER — HYDROCODONE-ACETAMINOPHEN 5-325 MG PO TABS: 1.0000 | ORAL_TABLET | Freq: Four times a day (QID) | ORAL | 0 refills | Status: DC | PRN

## 2022-02-10 MED ORDER — FENTANYL CITRATE (PF) 100 MCG/2ML IJ SOLN
INTRAMUSCULAR | Status: AC
  Filled 2022-02-10: qty 2

## 2022-02-10 MED ORDER — CEFAZOLIN IN SODIUM CHLORIDE 3-0.9 GM/100ML-% IV SOLN
3.0000 g | INTRAVENOUS | Status: AC
  Administered 2022-02-10: 3 g via INTRAVENOUS
  Filled 2022-02-10: qty 100

## 2022-02-10 MED ORDER — ORAL CARE MOUTH RINSE: 15.0000 mL | Freq: Once | OROMUCOSAL | Status: AC

## 2022-02-10 MED ORDER — FENTANYL CITRATE (PF) 250 MCG/5ML IJ SOLN
INTRAMUSCULAR | Status: DC | PRN
  Administered 2022-02-10: 50 ug via INTRAVENOUS

## 2022-02-10 MED ORDER — CEFAZOLIN SODIUM-DEXTROSE 2-4 GM/100ML-% IV SOLN
2.0000 g | Freq: Four times a day (QID) | INTRAVENOUS | Status: AC
  Administered 2022-02-10 – 2022-02-11 (×3): 2 g via INTRAVENOUS
  Filled 2022-02-10 (×3): qty 100

## 2022-02-10 MED ORDER — METHOCARBAMOL 500 MG IVPB - SIMPLE MED: 500.0000 mg | Freq: Four times a day (QID) | INTRAVENOUS | Status: DC | PRN

## 2022-02-10 MED ORDER — ONDANSETRON HCL 4 MG/2ML IJ SOLN
INTRAMUSCULAR | Status: AC
  Filled 2022-02-10: qty 2

## 2022-02-10 MED ORDER — NYSTATIN 100000 UNIT/GM EX POWD: 1.0000 | Freq: Three times a day (TID) | CUTANEOUS | Status: DC

## 2022-02-10 MED ORDER — ONDANSETRON HCL 4 MG/2ML IJ SOLN
4.0000 mg | Freq: Four times a day (QID) | INTRAMUSCULAR | Status: DC | PRN
  Administered 2022-02-10 – 2022-02-13 (×3): 4 mg via INTRAVENOUS
  Filled 2022-02-10 (×2): qty 2

## 2022-02-10 MED ORDER — PHENYLEPHRINE HCL-NACL 20-0.9 MG/250ML-% IV SOLN
INTRAVENOUS | Status: DC | PRN
  Administered 2022-02-10: 25 ug/min via INTRAVENOUS

## 2022-02-10 MED ORDER — BENAZEPRIL HCL 20 MG PO TABS
20.0000 mg | ORAL_TABLET | Freq: Every day | ORAL | Status: DC
  Administered 2022-02-11: 20 mg via ORAL
  Filled 2022-02-10 (×3): qty 1

## 2022-02-10 MED ORDER — MENTHOL 3 MG MT LOZG
1.0000 | LOZENGE | OROMUCOSAL | Status: DC | PRN
  Administered 2022-02-10: 3 mg via ORAL
  Filled 2022-02-10: qty 9

## 2022-02-10 MED ORDER — GLYCOPYRROLATE 0.2 MG/ML IJ SOLN
INTRAMUSCULAR | Status: DC | PRN
  Administered 2022-02-10: .2 mg via INTRAVENOUS

## 2022-02-10 MED ORDER — METHOCARBAMOL 500 MG PO TABS: 500.0000 mg | ORAL_TABLET | Freq: Three times a day (TID) | ORAL | 1 refills | Status: DC | PRN

## 2022-02-10 MED ORDER — BUPIVACAINE HCL (PF) 0.5 % IJ SOLN
INTRAMUSCULAR | Status: DC | PRN
  Administered 2022-02-10: 15 mL via PERINEURAL

## 2022-02-10 MED ORDER — PROPOFOL 10 MG/ML IV BOLUS
INTRAVENOUS | Status: AC
  Filled 2022-02-10: qty 20

## 2022-02-10 MED ORDER — MUPIROCIN 2 % EX OINT: 1.0000 | TOPICAL_OINTMENT | Freq: Two times a day (BID) | CUTANEOUS | Status: DC

## 2022-02-10 MED ORDER — PANTOPRAZOLE SODIUM 40 MG PO TBEC
40.0000 mg | DELAYED_RELEASE_TABLET | Freq: Every day | ORAL | Status: DC
  Administered 2022-02-11 – 2022-02-14 (×4): 40 mg via ORAL
  Filled 2022-02-10 (×4): qty 1

## 2022-02-10 MED ORDER — ATORVASTATIN CALCIUM 10 MG PO TABS
10.0000 mg | ORAL_TABLET | Freq: Every day | ORAL | Status: DC
  Administered 2022-02-10 – 2022-02-13 (×4): 10 mg via ORAL
  Filled 2022-02-10 (×4): qty 1

## 2022-02-10 MED ORDER — LACTATED RINGERS IV SOLN: INTRAVENOUS | Status: DC

## 2022-02-10 MED ORDER — HYDROCODONE-ACETAMINOPHEN 5-325 MG PO TABS
1.0000 | ORAL_TABLET | ORAL | Status: DC | PRN
  Administered 2022-02-11 (×2): 2 via ORAL
  Administered 2022-02-11 – 2022-02-14 (×7): 1 via ORAL
  Filled 2022-02-10: qty 1
  Filled 2022-02-10: qty 2
  Filled 2022-02-10 (×2): qty 1
  Filled 2022-02-10: qty 2
  Filled 2022-02-10 (×4): qty 1

## 2022-02-10 MED ORDER — METOCLOPRAMIDE HCL 5 MG/ML IJ SOLN: 5.0000 mg | Freq: Three times a day (TID) | INTRAMUSCULAR | Status: DC | PRN

## 2022-02-10 MED ORDER — ACETAMINOPHEN 500 MG PO TABS: 1000.0000 mg | ORAL_TABLET | Freq: Once | ORAL | Status: DC

## 2022-02-10 SURGICAL SUPPLY — 74 items
AID PSTN UNV HD RSTRNT DISP (MISCELLANEOUS) ×1
BAG COUNTER SPONGE SURGICOUNT (BAG) IMPLANT
BAG SPEC THK2 15X12 ZIP CLS (MISCELLANEOUS)
BAG SPNG CNTER NS LX DISP (BAG)
BAG ZIPLOCK 12X15 (MISCELLANEOUS) IMPLANT
BIT DRILL 1.6MX128 (BIT) IMPLANT
BIT DRILL 170X2.5X (BIT) IMPLANT
BIT DRL 170X2.5X (BIT) ×1
BLADE SAG 18X100X1.27 (BLADE) ×2 IMPLANT
COVER BACK TABLE 60X90IN (DRAPES) ×2 IMPLANT
COVER SURGICAL LIGHT HANDLE (MISCELLANEOUS) ×2 IMPLANT
CUP D38 DXTEND STAND PLUS 6 HU (Orthopedic Implant) ×1 IMPLANT
CUP STD D38 DXTEND PLUS 6 HU (Orthopedic Implant) IMPLANT
DRAPE INCISE IOBAN 66X45 STRL (DRAPES) ×2 IMPLANT
DRAPE ORTHO SPLIT 77X108 STRL (DRAPES) ×2
DRAPE POUCH INSTRU U-SHP 10X18 (DRAPES) IMPLANT
DRAPE SHEET LG 3/4 BI-LAMINATE (DRAPES) ×2 IMPLANT
DRAPE SURG ORHT 6 SPLT 77X108 (DRAPES) ×4 IMPLANT
DRAPE TOP 10253 STERILE (DRAPES) ×2 IMPLANT
DRAPE U-SHAPE 47X51 STRL (DRAPES) ×2 IMPLANT
DRILL 2.5 (BIT) ×1
DRSG ADAPTIC 3X8 NADH LF (GAUZE/BANDAGES/DRESSINGS) ×2 IMPLANT
DURAPREP 26ML APPLICATOR (WOUND CARE) ×2 IMPLANT
ECCENTRIC EPIPHYSI MODULAR SZ1 (Trauma) IMPLANT
ELECT BLADE TIP CTD 4 INCH (ELECTRODE) ×2 IMPLANT
ELECT NDL TIP 2.8 STRL (NEEDLE) ×2 IMPLANT
ELECT NEEDLE TIP 2.8 STRL (NEEDLE) ×1 IMPLANT
ELECT REM PT RETURN 15FT ADLT (MISCELLANEOUS) ×2 IMPLANT
FACESHIELD WRAPAROUND (MASK) ×1 IMPLANT
FACESHIELD WRAPAROUND OR TEAM (MASK) ×2 IMPLANT
GAUZE PAD ABD 8X10 STRL (GAUZE/BANDAGES/DRESSINGS) ×2 IMPLANT
GAUZE SPONGE 4X4 12PLY STRL (GAUZE/BANDAGES/DRESSINGS) ×2 IMPLANT
GLENOSPHERE DELTA XTEND LAT 38 (Miscellaneous) IMPLANT
GLOVE BIOGEL PI IND STRL 7.5 (GLOVE) ×2 IMPLANT
GLOVE BIOGEL PI IND STRL 8.5 (GLOVE) ×2 IMPLANT
GLOVE ORTHO TXT STRL SZ7.5 (GLOVE) ×2 IMPLANT
GLOVE SURG ORTHO 8.5 STRL (GLOVE) ×2 IMPLANT
GOWN STRL REUS W/ TWL XL LVL3 (GOWN DISPOSABLE) ×4 IMPLANT
GOWN STRL REUS W/TWL XL LVL3 (GOWN DISPOSABLE) ×2
KIT BASIN OR (CUSTOM PROCEDURE TRAY) ×2 IMPLANT
KIT TURNOVER KIT A (KITS) IMPLANT
MANIFOLD NEPTUNE II (INSTRUMENTS) ×2 IMPLANT
METAGLENE DELTA EXTEND (Trauma) IMPLANT
METAGLENE DXTEND (Trauma) ×1 IMPLANT
MODULAR ECCENTRIC EPIPHYSI SZ1 (Trauma) ×1 IMPLANT
NDL MAYO CATGUT SZ4 TPR NDL (NEEDLE) ×2 IMPLANT
NEEDLE MAYO CATGUT SZ4 (NEEDLE) ×1 IMPLANT
NS IRRIG 1000ML POUR BTL (IV SOLUTION) ×2 IMPLANT
PACK SHOULDER (CUSTOM PROCEDURE TRAY) ×2 IMPLANT
PIN GUIDE 1.2 (PIN) IMPLANT
PIN GUIDE GLENOPHERE 1.5MX300M (PIN) IMPLANT
PIN METAGLENE 2.5 (PIN) IMPLANT
PROTECTOR NERVE ULNAR (MISCELLANEOUS) ×2 IMPLANT
RESTRAINT HEAD UNIVERSAL NS (MISCELLANEOUS) ×2 IMPLANT
SCREW 4.5X36MM (Screw) IMPLANT
SCREW LOCK 42 (Screw) IMPLANT
SLING ARM FOAM STRAP LRG (SOFTGOODS) IMPLANT
SMARTMIX MINI TOWER (MISCELLANEOUS)
SPIKE FLUID TRANSFER (MISCELLANEOUS) ×2 IMPLANT
SPONGE T-LAP 4X18 ~~LOC~~+RFID (SPONGE) ×2 IMPLANT
STEM DELTA DIA 10 HA (Stem) IMPLANT
STRIP CLOSURE SKIN 1/2X4 (GAUZE/BANDAGES/DRESSINGS) ×2 IMPLANT
SUCTION FRAZIER HANDLE 10FR (MISCELLANEOUS) ×1
SUCTION TUBE FRAZIER 10FR DISP (MISCELLANEOUS) ×2 IMPLANT
SUT FIBERWIRE #2 38 T-5 BLUE (SUTURE) ×2
SUT MNCRL AB 4-0 PS2 18 (SUTURE) ×2 IMPLANT
SUT VIC AB 0 CT1 36 (SUTURE) ×4 IMPLANT
SUT VIC AB 0 CT2 27 (SUTURE) ×2 IMPLANT
SUT VIC AB 2-0 CT1 27 (SUTURE) ×1
SUT VIC AB 2-0 CT1 TAPERPNT 27 (SUTURE) ×2 IMPLANT
SUTURE FIBERWR #2 38 T-5 BLUE (SUTURE) ×4 IMPLANT
TAPE STRIPS DRAPE STRL (GAUZE/BANDAGES/DRESSINGS) IMPLANT
TOWEL OR 17X26 10 PK STRL BLUE (TOWEL DISPOSABLE) ×2 IMPLANT
TOWER SMARTMIX MINI (MISCELLANEOUS) IMPLANT

## 2022-02-10 NOTE — Op Note (Signed)
NAME: Jodi Keller, MCCHRISTIAN MEDICAL RECORD NO: [id number] ACCOUNT NO: [id number] DATE OF BIRTH: Jun 20, 1955 FACILITY: [facility] LOCATION: [facility] PHYSICIAN: Doran Heater. Veverly Fells, MD  Operative Report   DATE OF PROCEDURE: 02/10/2022  PREOPERATIVE DIAGNOSIS:  Right shoulder rotator cuff tear arthropathy.  POSTOPERATIVE DIAGNOSIS:  Right shoulder rotator cuff tear arthropathy.  PROCEDURE PERFORMED:  Right reverse shoulder replacement using DePuy Delta Xtend prosthesis with no subscap repair.  ATTENDING SURGEON:  Doran Heater. Veverly Fells, MD  ASSISTANT:  Charletta Cousin [place], [place], who was scrubbed during the entire procedure, and necessary for satisfactory completion of surgery.  ANESTHESIA:  General anesthesia plus interscalene block was used.  ESTIMATED BLOOD LOSS:  250 mL.  FLUID REPLACEMENT:  1500 mL crystalloid.  COUNTS:  Instrument counts correct.  COMPLICATIONS:  No complications.  ANTIBIOTICS:  Perioperative antibiotics were given.  INDICATIONS:  The patient is a 67 year old female with worsening right shoulder pain and dysfunction secondary to rotator cuff tear arthropathy.  The patient has failed conservative management, repaired of years and presents now for operative treatment  to eliminate pain and restore function.  Informed consent obtained.  DESCRIPTION OF PROCEDURE:  After an adequate level of general anesthesia was achieved, plus interscalene block the patient was positioned in the modified beach chair position.  Right shoulder correctly identified and sterilely prepped and draped in the  usual manner.  Timeout called, verifying correct patient, correct site.  We used a deltopectoral approach, starting at the coracoid process extending down to the anterior humerus.  Dissection down through subcutaneous tissues using Bovie.  We identified  the cephalic vein and took that laterally with the deltoid, pectoralis taken medially.  Conjoined tendon identified and retracted medially.  The patient  had no significant rotator cuff remaining attached including supraspinatus, infraspinatus and the  subscap, teres minor was intact.  We released the inferior capsule externally rotating his shoulder and delivering the humerus out of the wound.  Significant arthritis was noted.  We entered the proximal humerus with a 6 mm reamer, reaming up to a size  10.  We then used the 10 mm T-handle guide and resected the head at 20 degrees of retroversion.  Next, we removed osteophytes with a rongeur.  We then subluxed the humerus posteriorly, gaining good exposure of the glenoid face.  We removed the capsule in  the labrum and the remaining cartilage.  We found our center point for a guide pin and placed our guide pin under power.  Next, we reamed to the subchondral bone.  We did our peripheral hand reaming and then drilled out our central peg hole, impacted  the HA coated metaglene baseplate into position and then placed a 42 screw inferiorly and locked that and a 36 superiorly at the base of coracoid and locked that.  Due to the small AP diameter of the glenoid, we can only get 2 screws, but we had good  baseplate security.  We then selected a 38 standard glenosphere and attached that to the baseplate with a screwdriver.  We had a nice stable glenosphere.  I did a finger sweep to make sure we had no soft tissue caught up in that bearing between the  baseplate and the glenosphere.  We then went to the humeral side and reamed for the one right metaphysis.  We then trialled with a 10 stem, 1 right metaphysis set on the 0 setting and impacted in 20 degrees of retroversion.  We reduced the shoulder and a  38+3 poly trial, happy with  our soft tissue balancing we removed the trial components.  We irrigated thoroughly.  We then used available bone graft from the humeral head with impaction grafting technique and used the HA coated 10 stem and the one right  metaphysis impacted in 20 degrees of retroversion set on the 0  setting.  Once we had that in place, we selected the real 38+6 poly feeling like we wanted to be just a tiny little bit tighter on this patient and that reduced nicely.  Once we impacted on  the humeral tray, reduced the shoulder, had nice little pop as it reduced.  Conjoined tendon was appropriately tight.  No gapping with inferior pole or external rotation.  We then irrigated thoroughly and repaired the deltopectoral interval with 0 Vicryl  suture followed by 2-0 Vicryl for subcutaneous closure and 4-0 Monocryl for skin.  Steri-Strips applied followed by sterile dressing.  The patient tolerated surgery well.   PUS D: 02/10/2022 8:55:42 am T: 02/10/2022 9:30:00 am  JOB: [id number]/ [id number]

## 2022-02-10 NOTE — Transfer of Care (Signed)
Immediate Anesthesia Transfer of Care Note  Patient: Jodi Keller  Procedure(s) Performed: REVERSE SHOULDER ARTHROPLASTY (Right: Shoulder)  Patient Location: PACU  Anesthesia Type:GA combined with regional for post-op pain  Level of Consciousness: awake, alert , and oriented  Airway & Oxygen Therapy: Patient Spontanous Breathing and Patient connected to face mask oxygen  Post-op Assessment: Report given to RN and Post -op Vital signs reviewed and stable  Post vital signs: Reviewed and stable  Last Vitals:  Vitals Value Taken Time  BP 128/63 02/10/22 0901  Temp    Pulse 66 02/10/22 0903  Resp 17 02/10/22 0903  SpO2 100 % 02/10/22 0903  Vitals shown include unvalidated device data.  Last Pain:  Vitals:   02/10/22 0549  TempSrc: Oral  PainSc: 2          Complications: No notable events documented.

## 2022-02-10 NOTE — Discharge Instructions (Signed)
Ice to the shoulder constantly.  Keep the incision covered and clean and dry for one week, then ok to get it wet in the shower.  Do exercise as instructed several times per day.  DO NOT reach behind your back or push up out of a chair with the operative arm.  Use a sling while you are up and around for comfort, may remove while seated.  Keep pillow propped behind the operative elbow.  Follow up with Dr Veverly Fells in two weeks in the office, call [phone] for appt  Call Dr Veverly Fells (cell) [phone] with any questions or concerns

## 2022-02-10 NOTE — Anesthesia Procedure Notes (Signed)
Anesthesia Regional Block: Interscalene brachial plexus block   Pre-Anesthetic Checklist: , timeout performed,  Correct Patient, Correct Site, Correct Laterality,  Correct Procedure, Correct Position, site marked,  Risks and benefits discussed,  Pre-op evaluation,  At surgeon's request and post-op pain management  Laterality: Right  Prep: Maximum Sterile Barrier Precautions used, chloraprep       Needles:  Injection technique: Single-shot  Needle Type: Echogenic Stimulator Needle     Needle Length: 5cm  Needle Gauge: 21     Additional Needles:   Procedures:,,,, ultrasound used (permanent image in chart),,    Narrative:  Start time: 02/10/2022 7:10 AM End time: 02/10/2022 7:15 AM Injection made incrementally with aspirations every 5 mL. Anesthesiologist: Freddrick March, MD

## 2022-02-10 NOTE — Anesthesia Postprocedure Evaluation (Signed)
Anesthesia Post Note  Patient: Jodi Keller  Procedure(s) Performed: REVERSE SHOULDER ARTHROPLASTY (Right: Shoulder)     Patient location during evaluation: PACU Anesthesia Type: Regional and General Level of consciousness: awake and alert Pain management: pain level controlled Vital Signs Assessment: post-procedure vital signs reviewed and stable Respiratory status: spontaneous breathing, nonlabored ventilation, respiratory function stable and patient connected to nasal cannula oxygen Cardiovascular status: blood pressure returned to baseline and stable Postop Assessment: no apparent nausea or vomiting Anesthetic complications: no  No notable events documented.  Last Vitals:  Vitals:   02/10/22 1100 02/10/22 1145  BP: 118/70 121/70  Pulse: 65 63  Resp: 17 17  Temp:    SpO2: 96% 94%    Last Pain:  Vitals:   02/10/22 1100  TempSrc:   PainSc: Asleep                 Taquan Bralley L Mer[place]eth [place]

## 2022-02-10 NOTE — Anesthesia Procedure Notes (Signed)
Procedure Name: Intubation Date/Time: 02/10/2022 7:32 AM  Performed by: [place] Manus Rudd, CRNAPre-anesthesia Checklist: Patient identified, Emergency Drugs available, Suction available and Patient being monitored Patient Re-evaluated:Patient Re-evaluated prior to induction Oxygen Delivery Method: Circle system utilized Preoxygenation: Pre-oxygenation with 100% oxygen Induction Type: IV induction Ventilation: Mask ventilation without difficulty Tube type: Oral Number of attempts: 1 Airway Equipment and Method: Stylet and Oral airway Placement Confirmation: ETT inserted through vocal cords under direct vision, positive ETCO2 and breath sounds checked- equal and bilateral Tube secured with: Tape Dental Injury: Teeth and Oropharynx as per pre-operative assessment

## 2022-02-10 NOTE — Anesthesia Preprocedure Evaluation (Addendum)
Anesthesia Evaluation  Patient identified by MRN, date of birth, ID band Patient awake    Reviewed: Allergy & Precautions, NPO status , Patient's Chart, lab work & pertinent test results  Airway Mallampati: III  TM Distance: >3 FB Neck ROM: Full    Dental no notable dental hx. (+) Teeth Intact, Dental Advisory Given   Pulmonary sleep apnea and Continuous Positive Airway Pressure Ventilation    Pulmonary exam normal breath sounds clear to auscultation       Cardiovascular hypertension, Pt. on medications Normal cardiovascular exam Rhythm:Regular Rate:Normal     Neuro/Psych  PSYCHIATRIC DISORDERS Anxiety     negative neurological ROS     GI/Hepatic Neg liver ROS, hiatal hernia,GERD  ,,  Endo/Other  diabetes, Well Controlled  Morbid obesity (BMI 49)  Renal/GU negative Renal ROS  negative genitourinary   Musculoskeletal  (+) Arthritis ,    Abdominal   Peds  Hematology negative hematology ROS (+)   Anesthesia Other Findings   Reproductive/Obstetrics                             Anesthesia Physical Anesthesia Plan  ASA: 3  Anesthesia Plan: General and Regional   Post-op Pain Management: Regional block* and Tylenol PO (pre-op)*   Induction: Intravenous  PONV Risk Score and Plan: 3 and Midazolam, Dexamethasone and Ondansetron  Airway Management Planned: Oral ETT  Additional Equipment:   Intra-op Plan:   Post-operative Plan: Extubation in OR  Informed Consent: I have reviewed the patients History and Physical, chart, labs and discussed the procedure including the risks, benefits and alternatives for the proposed anesthesia with the patient or authorized representative who has indicated his/her understanding and acceptance.     Dental advisory given  Plan Discussed with: CRNA  Anesthesia Plan Comments:        Anesthesia Quick Evaluation

## 2022-02-10 NOTE — Brief Op Note (Signed)
02/10/2022  8:50 AM  PATIENT:  Jodi Keller  67 y.o. female  PRE-OPERATIVE DIAGNOSIS:  right shoulder osteoarthritis, rotator cuff tear arthropathy  POST-OPERATIVE DIAGNOSIS:  right shoulder osteoarthritis, rotator cuff tear arthropathy   PROCEDURE:  Procedure(s) with comments: REVERSE SHOULDER ARTHROPLASTY (Right) - 120 min choice and interscalene block  DePuy Delta Xtend  SURGEON:  Surgeon(s) and Role:    Netta Cedars, MD - Primary  PHYSICIAN ASSISTANT:   ASSISTANTS: [place] Bruns, PA-C   ANESTHESIA:   regional and general  EBL:  200 mL   BLOOD ADMINISTERED:none  DRAINS: none   LOCAL MEDICATIONS USED:  MARCAINE     SPECIMEN:  No Specimen  DISPOSITION OF SPECIMEN:  N/A  COUNTS:  YES  TOURNIQUET:  * No tourniquets in log *  DICTATION: .Other Dictation: Dictation Number [id number]  PLAN OF CARE: Admit to inpatient   PATIENT DISPOSITION:  PACU - hemodynamically stable.   Delay start of Pharmacological VTE agent (>24hrs) due to surgical blood loss or risk of bleeding: not applicable

## 2022-02-10 NOTE — Interval H&P Note (Signed)
History and Physical Interval Note:  02/10/2022 7:06 AM  Jodi Keller  has presented today for surgery, with the diagnosis of right shoulder osteoarthritis.  The various methods of treatment have been discussed with the patient and family. After consideration of risks, benefits and other options for treatment, the patient has consented to  Procedure(s) with comments: REVERSE SHOULDER ARTHROPLASTY (Right) - 120 min choice and interscalene block as a surgical intervention.  The patient's history has been reviewed, patient examined, no change in status, stable for surgery.  I have reviewed the patient's chart and labs.  Questions were answered to the patient's satisfaction.     Augustin Schooling

## 2022-02-11 DIAGNOSIS — Z79899 Other long term (current) drug therapy: Secondary | ICD-10-CM | POA: Diagnosis not present

## 2022-02-11 DIAGNOSIS — E119 Type 2 diabetes mellitus without complications: Secondary | ICD-10-CM | POA: Diagnosis not present

## 2022-02-11 DIAGNOSIS — I10 Essential (primary) hypertension: Secondary | ICD-10-CM | POA: Diagnosis not present

## 2022-02-11 DIAGNOSIS — M75101 Unspecified rotator cuff tear or rupture of right shoulder, not specified as traumatic: Secondary | ICD-10-CM | POA: Diagnosis not present

## 2022-02-11 DIAGNOSIS — Z7982 Long term (current) use of aspirin: Secondary | ICD-10-CM | POA: Diagnosis not present

## 2022-02-11 LAB — BASIC METABOLIC PANEL
Anion gap: 6 (ref 5–15)
BUN: 17 mg/dL (ref 8–23)
CO2: 25 mmol/L (ref 22–32)
Calcium: 8.7 mg/dL — ABNORMAL LOW (ref 8.9–10.3)
Chloride: 108 mmol/L (ref 98–111)
Creatinine, Ser: 0.83 mg/dL (ref 0.44–1.00)
GFR, Estimated: >60 mL/min (ref >60)
Glucose, Bld: 149 mg/dL — ABNORMAL HIGH (ref 70–99)
Potassium: 3.5 mmol/L (ref 3.5–5.1)
Sodium: 139 mmol/L (ref 135–145)

## 2022-02-11 LAB — HEMOGLOBIN AND HEMATOCRIT, BLOOD
HCT: 38.1 % (ref 36.0–46.0)
Hemoglobin: 12.5 g/dL (ref 12.0–15.0)

## 2022-02-11 NOTE — Evaluation (Signed)
Occupational Therapy Evaluation Patient Details Name: Jodi Keller MRN: [id number] DOB: 1955-10-09 Today's Date: 02/11/2022   History of Present Illness Patient is a 67 year old female who presented with multiple year history of shoulder pain with imaging revealing end stage arthritic changes. Patient underwent right reverse shoulder arthroplasty on 1/19. PMH: obesity, GERD, bilateral carpal tunnel syndrome, anxiety disorder, uterine prolapse.   Clinical Impression   Patient is a 67 year old female who was admitted for above. s/p shoulder replacement without functional use of right dominant upper extremity secondary to effects of surgery and interscalene block and shoulder precautions. Therapist provided education and instruction to patient and daughter in regards to exercises, precautions, positioning,ice and edema management and donning/doffing sling. Patient continues to need increased A with decreased control of RUE with block still in place and decreased standing balance.Patient would continue to benefit from skilled OT services at this time while admitted and after d/c to address noted deficits in order to improve overall safety and independence in ADLs.       Recommendations for follow up therapy are one component of a multi-disciplinary discharge planning process, led by the attending physician.  Recommendations may be updated based on patient status, additional functional criteria and insurance authorization.   Follow Up Recommendations  Home health OT     Assistance Recommended at Discharge Frequent or constant Supervision/Assistance  Patient can return home with the following A lot of help with bathing/dressing/bathroom;Assistance with cooking/housework;Direct supervision/assist for medications management;Assist for transportation;Help with stairs or ramp for entrance;Direct supervision/assist for financial management;A little help with walking and/or transfers    Functional  Status Assessment  Patient has had a recent decline in their functional status and demonstrates the ability to make significant improvements in function in a reasonable and predictable amount of time.  Equipment Recommendations  None recommended by OT    Recommendations for Other Services       Precautions / Restrictions Precautions Precautions: Shoulder Type of Shoulder Precautions: per orders no ROM shoulder, ok for hand wrist and elbow. per verbal order Dr.Norris on 1/20 "good for active ROM with ADL tasks, limited WB for balance and functional mobility". Shoulder Interventions: Shoulder sling/immobilizer;At all times;Off for dressing/bathing/exercises Precaution Booklet Issued: Yes (comment) (handout) Restrictions Weight Bearing Restrictions: Yes RUE Weight Bearing: Non weight bearing      Mobility Bed Mobility Overal bed mobility: Needs Assistance Bed Mobility: Supine to Sit     Supine to sit: Mod assist     General bed mobility comments: attempted to sit up on edge of bed on L side with patient reproting increased pain on RUE with attempt. patient sitting up on R side of bed with mod A for supine to sit with increased time and patient attempting to pull up with LUE.        Balance Overall balance assessment: Mild deficits observed, not formally tested               ADL either performed or assessed with clinical judgement   ADL Overall ADL's : Needs assistance/impaired Eating/Feeding: Supervision/ safety;Sitting   Grooming: Brushing hair;Wash/dry face;Wash/dry hands;Oral care;Set up;Standing Grooming Details (indicate cue type and reason): oral care at sink. brushing hair sitting in recliner with LUE Upper Body Bathing: Moderate assistance;Sitting   Lower Body Bathing: Moderate assistance;Sitting/lateral leans   Upper Body Dressing : Moderate assistance;Sitting   Lower Body Dressing: Sitting/lateral leans;Maximal assistance   Toilet Transfer: Min  Psychiatric nurse Details (indicate cue type and reason): noted  to furniture walk to bathroom with rollator unable to fit into bathroom to get to commode. patient noted to be impulsive at times and repoted increased fear of falling. Toileting- Water quality scientist and Hygiene: Min guard;Sitting/lateral lean;Sit to/from Sales promotion account executive Details (indicate cue type and reason): with use of grab bar             Vision Patient Visual Report: No change from baseline              Pertinent Vitals/Pain Pain Assessment Pain Assessment: 0-10 Pain Score: 4  Pain Location: R shoulder Pain Descriptors / Indicators: Operative site guarding Pain Intervention(s): Limited activity within patient's tolerance, Patient requesting pain meds-RN notified, Repositioned, Monitored during session, RN gave pain meds during session, Ice applied     Hand Dominance Right   Extremity/Trunk Assessment Upper Extremity Assessment Upper Extremity Assessment: RUE deficits/detail RUE Deficits / Details: pain limiting participation. thumb and index fingers still numb. RUE: Unable to fully assess due to pain   Lower Extremity Assessment Lower Extremity Assessment: Defer to PT evaluation   Cervical / Trunk Assessment Cervical / Trunk Assessment: Normal   Communication Communication Communication: No difficulties   Cognition Arousal/Alertness: Awake/alert Behavior During Therapy: WFL for tasks assessed/performed Overall Cognitive Status: Within Functional Limits for tasks assessed       General Comments: daughter was present as well. 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 to:: Private residence Living Arrangements: Alone Available Help at Discharge: Family;Available PRN/intermittently (working on getting 24/7 supervision)                    OT Problem List: Decreased strength;Decreased activity  tolerance;Impaired balance (sitting and/or standing);Decreased coordination;Decreased safety awareness;Impaired UE functional use;Pain      OT Treatment/Interventions: Energy conservation;Self-care/ADL training;Therapeutic exercise;DME and/or AE instruction;Therapeutic activities;Patient/family education;Balance training    OT Goals(Current goals can be found in the care plan section) Acute Rehab OT Goals Patient Stated Goal: to get to job interviews OT Goal Formulation: With patient/family Time For Goal Achievement: 02/25/22 Potential to Achieve Goals: Fair  OT Frequency: Min 2X/week       AM-PAC OT "6 Clicks" Daily Activity     Outcome Measure Help from another person eating meals?: A Little Help from another person taking care of personal grooming?: A Little Help from another person toileting, which includes using toliet, bedpan, or urinal?: A Lot Help from another person bathing (including washing, rinsing, drying)?: A Lot Help from another person to put on and taking off regular upper body clothing?: A Lot Help from another person to put on and taking off regular lower body clothing?: A Lot 6 Click Score: 14   End of Session Equipment Utilized During Treatment: Other (comment) (personal rollator) Nurse Communication: Patient requests pain meds  Activity Tolerance: Patient tolerated treatment well Patient left: in chair;with call bell/phone within reach  OT Visit Diagnosis: Unsteadiness on feet (R26.81);Other abnormalities of gait and mobility (R26.89);Muscle weakness (generalized) (M62.81);Pain Pain - Right/Left: Right Pain - part of body: Shoulder                Time: 8338-2505 OT Time Calculation (min): 39 min Charges:  OT General Charges $OT Visit: 1 Visit OT Evaluation $OT Eval Moderate Complexity: 1 Mod OT Treatments $Self Care/Home Management : 23-37 mins  [name], MS Acute Rehabilitation Department Office# [phone]   [facility]  Billey Chang 02/11/2022, 9:04  AM

## 2022-02-11 NOTE — Evaluation (Signed)
Physical Therapy Evaluation Patient Details Name: Jodi Keller MRN: [id number] DOB: 05/18/1955 Today's Date: 02/11/2022  History of Present Illness  Patient is a 67 year old female who presented with multiple year history of shoulder pain with imaging revealing end stage arthritic changes. Patient underwent right reverse shoulder arthroplasty on 1/19. PMH: obesity, GERD, bilateral carpal tunnel syndrome, anxiety disorder, uterine prolapse, R ankle fusion 2021  Clinical Impression  Pt admitted with above diagnosis.  Pt did well overall this session, startign to "feel" have some pain in R shoulder. Amb hallway distance with RW. Pt uses RW, rollator or furniture surfs at home. Pt would like to work on "curb" next PT session. Would benefit from HHPT   Pt currently with functional limitations due to the deficits listed below (see PT Problem List). Pt will benefit from skilled PT to increase their independence and safety with mobility to allow discharge to the venue listed below.          Recommendations for follow up therapy are one component of a multi-disciplinary discharge planning process, led by the attending physician.  Recommendations may be updated based on patient status, additional functional criteria and insurance authorization.  Follow Up Recommendations Home health PT      Assistance Recommended at Discharge Intermittent Supervision/Assistance  Patient can return home with the following  A little help with bathing/dressing/bathroom;Assistance with cooking/housework;Assist for transportation;Help with stairs or ramp for entrance    Equipment Recommendations None recommended by PT  Recommendations for Other Services       Functional Status Assessment Patient has had a recent decline in their functional status and demonstrates the ability to make significant improvements in function in a reasonable and predictable amount of time.     Precautions / Restrictions  Precautions Precautions: Shoulder Type of Shoulder Precautions: per orders no ROM shoulder, ok for hand wrist and elbow. per verbal order Dr.Norris on 1/20 "good for active ROM with ADL tasks, limited WB for balance and functional mobility". Shoulder Interventions: Shoulder sling/immobilizer;At all times;Off for dressing/bathing/exercises Precaution Booklet Issued: Yes (comment) (handout) Restrictions Weight Bearing Restrictions: Yes RUE Weight Bearing: Non weight bearing      Mobility  Bed Mobility Overal bed mobility: Needs Assistance Bed Mobility: Sit to Supine       Sit to supine: Min assist   General bed mobility comments: incr time, assist to progress LEs on to bed completely, bed flat; pt able to reposition self once in bed    Transfers Overall transfer level: Needs assistance Equipment used: Rolling walker (2 wheels) Transfers: Sit to/from Stand Sit to Stand: Min guard           General transfer comment: cues for NWB RUE, use of L hand to push up    Ambulation/Gait Ambulation/Gait assistance: Supervision, Min guard Gait Distance (Feet): 100 Feet Assistive device: Rolling walker (2 wheels) Gait Pattern/deviations: Step-through pattern, Decreased stride length       General Gait Details: min/guard to supervision for safety, intermittent assist to maneuver RW, cues for light use RUE only; good stability, no LOB noted  Stairs            Wheelchair Mobility    Modified Rankin (Stroke Patients Only)       Balance Overall balance assessment: Mild deficits observed, not formally tested  Pertinent Vitals/Pain Pain Assessment Pain Assessment: Faces Faces Pain Scale: Hurts a little bit Pain Location: R shoulder Pain Descriptors / Indicators: Operative site guarding, Grimacing Pain Intervention(s): Limited activity within patient's tolerance, Monitored during session, Premedicated before  session, Repositioned, Ice applied    Home Living Family/patient expects to be discharged to:: Private residence Living Arrangements: Alone Available Help at Discharge: Family;Available PRN/intermittently   Home Access: Stairs to enter   Entrance Stairs-Number of Steps: "curb", could use small ramp (pt reports being fearful d/t fused ankle)   Home Layout: One level Home Equipment: Rollator (4 wheels);Rolling Walker (2 wheels)      Prior Function Prior Level of Function : Independent/Modified Independent;Driving               ADLs Comments: rollator level, Corporate treasurer for work     Hand Dominance   Dominant Hand: Right    Extremity/Trunk Assessment   Upper Extremity Assessment Upper Extremity Assessment: Defer to OT evaluation RUE Deficits / Details: pain limiting participation. thumb and index fingers still numb. RUE: Unable to fully assess due to pain    Lower Extremity Assessment Lower Extremity Assessment:  (pt reports "hardware" L ankle, R ankle fused per hx)    Cervical / Trunk Assessment Cervical / Trunk Assessment: Normal  Communication   Communication: No difficulties  Cognition Arousal/Alertness: Awake/alert Behavior During Therapy: WFL for tasks assessed/performed Overall Cognitive Status: Within Functional Limits for tasks assessed                                          General Comments      Exercises     Assessment/Plan    PT Assessment Patient needs continued PT services  PT Problem List Decreased mobility;Obesity;Decreased balance;Decreased knowledge of precautions;Decreased knowledge of use of DME;Pain       PT Treatment Interventions DME instruction;Therapeutic exercise;Gait training;Stair training;Therapeutic activities;Patient/family education;Functional mobility training    PT Goals (Current goals can be found in the Care Plan section)  Acute Rehab PT Goals PT Goal Formulation: With patient Time For Goal  Achievement: 02/18/22 Potential to Achieve Goals: Good    Frequency Min 3X/week     Co-evaluation               AM-PAC PT "6 Clicks" Mobility  Outcome Measure Help needed turning from your back to your side while in a flat bed without using bedrails?: A Lot Help needed moving from lying on your back to sitting on the side of a flat bed without using bedrails?: A Lot Help needed moving to and from a bed to a chair (including a wheelchair)?: A Little Help needed standing up from a chair using your arms (e.g., wheelchair or bedside chair)?: A Little Help needed to walk in hospital room?: A Little Help needed climbing 3-5 steps with a railing? : A Little 6 Click Score: 16    End of Session Equipment Utilized During Treatment: Gait belt Activity Tolerance: Patient tolerated treatment well Patient left: with call bell/phone within reach;in bed;with bed alarm set   PT Visit Diagnosis: Other abnormalities of gait and mobility (R26.89)    Time: 6720-9470 PT Time Calculation (min) (ACUTE ONLY): 20 min   Charges:   PT Evaluation $PT Eval Low Complexity: [place], PT  Acute Rehab Dept ([facility]) [phone]  [facility] Weekend Pager (  Saturday/Sunday only)  [phone]  02/11/2022   [facility] [facility] 02/11/2022, 12:02 PM

## 2022-02-11 NOTE — TOC Transition Note (Signed)
Transition of Care [facility]) - CM/SW Discharge Note  Patient Details  Name: Jodi Keller MRN: [id number] Date of Birth: 01/16/1956  Transition of Care [facility]) CM/SW Contact:  Sherie Don, LCSW Phone Number: 02/11/2022, 12:40 PM  Clinical Narrative: PT and OT evaluations recommended HH but no DME. CSW spoke with patient regarding recommendations and patient is agreeable to [facility] referral. CSW made [facility] referral to Cindie with [facility], which was accepted. CSW updated patient regarding HH. Patient asked about a lift chair. CSW explained that TOC does not set up lift chairs.   Final next level of care: Home w Home Health Services Barriers to Discharge: No Barriers Identified  Patient Goals and CMS Choice CMS Medicare.gov Compare Post Acute Care list provided to:: Patient Choice offered to / list presented to : Patient  Discharge Plan and Services Additional resources added to the After Visit Summary for            DME Arranged: N/A DME Agency: NA HH Arranged: PT, OT HH Agency: [facility] Date [place]: 02/11/22 Representative spoke with at [place]: Cindie  Social Determinants of Health ([place]) Interventions SDOH Screenings   Food Insecurity: No Food Insecurity (02/10/2022)  Housing: Low Risk  (02/10/2022)  Transportation Needs: No Transportation Needs (02/10/2022)  Utilities: Not At Risk (02/10/2022)  Depression (PHQ2-9): Low Risk  (07/21/2021)  Tobacco Use: Low Risk  (02/10/2022)   Readmission Risk Interventions     No data to display

## 2022-02-11 NOTE — Plan of Care (Signed)
  Problem: Pain Management: Goal: Pain level will decrease with appropriate interventions Outcome: Progressing   Problem: Coping: Goal: Level of anxiety will decrease Outcome: Progressing

## 2022-02-11 NOTE — Progress Notes (Signed)
Subjective: 1 Day Post-Op Procedure(s) (LRB): REVERSE SHOULDER ARTHROPLASTY (Right) Patient reports pain as mild.  Concerned about issues at home getting around due to hx with her ankle, lives alone.  Objective: Vital signs in last 24 hours: Temp:  [97.2 F (36.2 C)-98.6 F (37 C)] 98 F (36.7 C) (01/20 0455) Pulse Rate:  [36-67] 59 (01/20 0455) Resp:  [15-22] 18 (01/20 0455) BP: (102-128)/(47-78) 128/59 (01/20 0455) SpO2:  [93 %-100 %] 97 % (01/20 0455)  Intake/Output from previous day: 01/19 0701 - 01/20 0700 In: 1745.9 [I.V.:1545.9; IV Piggyback:200] Out: 950 [Urine:700; Blood:250] Intake/Output this shift: No intake/output data recorded.  Recent Labs    02/11/22 0411  HGB 12.5   Recent Labs    02/11/22 0411  HCT 38.1   Recent Labs    02/11/22 0411  NA 139  K 3.5  CL 108  CO2 25  BUN 17  CREATININE 0.83  GLUCOSE 149*  CALCIUM 8.7*   No results for input(s): "LABPT", "INR" in the last 72 hours.  Neurologically intact ABD soft Neurovascular intact Sensation intact distally Intact pulses distally Dorsiflexion/Plantar flexion intact Incision: dressing C/D/I and no drainage No cellulitis present Compartment soft No calf pain or sign of DVT   Assessment/Plan: 1 Day Post-Op Procedure(s) (LRB): REVERSE SHOULDER ARTHROPLASTY (Right) Advance diet Up with therapy D/C IV fluids Discussed with Dr. Veverly Fells - social work consult, lift chair, 3 in 1, HHPT/OT Possible DC tomorrow   Jodi Keller 02/11/2022, 8:04 AM

## 2022-02-12 DIAGNOSIS — M75101 Unspecified rotator cuff tear or rupture of right shoulder, not specified as traumatic: Secondary | ICD-10-CM | POA: Diagnosis not present

## 2022-02-12 DIAGNOSIS — I10 Essential (primary) hypertension: Secondary | ICD-10-CM | POA: Diagnosis not present

## 2022-02-12 DIAGNOSIS — Z79899 Other long term (current) drug therapy: Secondary | ICD-10-CM | POA: Diagnosis not present

## 2022-02-12 DIAGNOSIS — Z7982 Long term (current) use of aspirin: Secondary | ICD-10-CM | POA: Diagnosis not present

## 2022-02-12 DIAGNOSIS — E119 Type 2 diabetes mellitus without complications: Secondary | ICD-10-CM | POA: Diagnosis not present

## 2022-02-12 MED ORDER — SODIUM CHLORIDE 0.9 % IV BOLUS
500.0000 mL | Freq: Once | INTRAVENOUS | Status: AC
  Administered 2022-02-12: 500 mL via INTRAVENOUS

## 2022-02-12 NOTE — Progress Notes (Signed)
Physical Therapy Treatment Patient Details Name: JOSEPH JOHNS MRN: [id number] DOB: 03/11/1955 Today's Date: 02/12/2022   History of Present Illness Patient is a 67 year old female who presented with multiple year history of shoulder pain with imaging revealing end stage arthritic changes. Patient underwent right reverse shoulder arthroplasty on 1/19. PMH: obesity, GERD, bilateral carpal tunnel syndrome, anxiety disorder, uterine prolapse, R ankle fusion 2021    PT Comments    Pt had and episode of nausea/dizziness with nursing staff earlier; symptoms much improved at time of PT session, very mild dizziness just before returning to bed. Continue PT in acute setting.  Recommendations for follow up therapy are one component of a multi-disciplinary discharge planning process, led by the attending physician.  Recommendations may be updated based on patient status, additional functional criteria and insurance authorization.  Follow Up Recommendations  Home health PT     Assistance Recommended at Discharge Intermittent Supervision/Assistance  Patient can return home with the following A little help with bathing/dressing/bathroom;Assistance with cooking/housework;Assist for transportation;Help with stairs or ramp for entrance   Equipment Recommendations  None recommended by PT    Recommendations for Other Services       Precautions / Restrictions Precautions Precautions: Shoulder Type of Shoulder Precautions: per orders no ROM shoulder, ok for hand wrist and elbow. per verbal order Dr.Norris on 1/20 "good for active ROM with ADL tasks, limited WB for balance and functional mobility". Shoulder Interventions: Shoulder sling/immobilizer;At all times;Off for dressing/bathing/exercises Restrictions RUE Weight Bearing: Non weight bearing     Mobility  Bed Mobility Overal bed mobility: Needs Assistance Bed Mobility: Supine to Sit, Sit to Supine     Supine to sit: Min guard, HOB  elevated Sit to supine: Min guard   General bed mobility comments: incr time, use of L rail. discussed possibility of added rail or wedge pillow at home    Transfers Overall transfer level: Needs assistance Equipment used: Rolling walker (2 wheels) Transfers: Sit to/from Stand Sit to Stand: Supervision           General transfer comment: pt demonstrates good carryover from previous session maintaining NWB on R and pushing up with L UE only    Ambulation/Gait Ambulation/Gait assistance: Supervision, Min guard Gait Distance (Feet): 90 Feet Assistive device: Rolling walker (2 wheels) Gait Pattern/deviations: Step-through pattern, Decreased stride length, Wide base of support       General Gait Details: min/guard to supervision for safety, intermittent assist to maneuver RW, cues for light use RUE only; good stability, no LOB noted   Stairs             Wheelchair Mobility    Modified Rankin (Stroke Patients Only)       Balance Overall balance assessment: Mild deficits observed, not formally tested                                          Cognition Arousal/Alertness: Awake/alert Behavior During Therapy: WFL for tasks assessed/performed Overall Cognitive Status: Within Functional Limits for tasks assessed                                 General Comments: dtr present        Exercises      General Comments General comments (skin integrity, edema, etc.): sling placed and adjusted to correct  position prior to return to bed      Pertinent Vitals/Pain Pain Assessment Pain Assessment: 0-10 Pain Score: 7  Pain Location: R shoulder Pain Descriptors / Indicators: Operative site guarding, Grimacing Pain Intervention(s): Limited activity within patient's tolerance, Monitored during session, Premedicated before session, Repositioned, Ice applied    Home Living                          Prior Function            PT  Goals (current goals can now be found in the care plan section) Acute Rehab PT Goals PT Goal Formulation: With patient Time For Goal Achievement: 02/18/22 Potential to Achieve Goals: Good Progress towards PT goals: Progressing toward goals    Frequency    Min 3X/week      PT Plan Current plan remains appropriate    Co-evaluation              AM-PAC PT "6 Clicks" Mobility   Outcome Measure  Help needed turning from your back to your side while in a flat bed without using bedrails?: A Lot Help needed moving from lying on your back to sitting on the side of a flat bed without using bedrails?: A Little Help needed moving to and from a bed to a chair (including a wheelchair)?: A Little Help needed standing up from a chair using your arms (e.g., wheelchair or bedside chair)?: A Little Help needed to walk in hospital room?: A Little Help needed climbing 3-5 steps with a railing? : A Little 6 Click Score: 17    End of Session Equipment Utilized During Treatment: Gait belt Activity Tolerance: Patient tolerated treatment well Patient left: with call bell/phone within reach;in bed;with bed alarm set;with family/visitor present Nurse Communication: Mobility status;Patient requests pain meds PT Visit Diagnosis: Other abnormalities of gait and mobility (R26.89)     Time: 9030-0923 PT Time Calculation (min) (ACUTE ONLY): 20 min  Charges:  $Gait Training: 8-22 mins                     Baxter Flattery, PT  Acute Rehab Dept [facility]) [phone]  [facility] Weekend Pager [facility] only)  [phone]  02/12/2022    [facility] 02/12/2022, 10:53 AM

## 2022-02-12 NOTE — Progress Notes (Signed)
Occupational Therapy Treatment Patient Details Name: Jodi Keller MRN: [id number] DOB: 1955-11-26 Today's Date: 02/12/2022   History of present illness Patient is a 67 year old female who presented with multiple year history of shoulder pain with imaging revealing end stage arthritic changes. Patient underwent right reverse shoulder arthroplasty on 1/19. PMH: obesity, GERD, bilateral carpal tunnel syndrome, anxiety disorder, uterine prolapse, R ankle fusion 2021   OT comments  Patient did not endorse dizziness during session today. Patient was educated on UB bathing and dressing tasks to increased independence in ADLs in next level of care. Patient did better with RW compared to rollator during session for navigation in room. Patient's discharge plan remains appropriate at this time. OT will continue to follow acutely.       Recommendations for follow up therapy are one component of a multi-disciplinary discharge planning process, led by the attending physician.  Recommendations may be updated based on patient status, additional functional criteria and insurance authorization.    Follow Up Recommendations  Home health OT     Assistance Recommended at Discharge Frequent or constant Supervision/Assistance  Patient can return home with the following  A lot of help with bathing/dressing/bathroom;Assistance with cooking/housework;Direct supervision/assist for medications management;Assist for transportation;Help with stairs or ramp for entrance;Direct supervision/assist for financial management;A little help with walking and/or transfers   Equipment Recommendations  None recommended by OT    Recommendations for Other Services      Precautions / Restrictions Restrictions Weight Bearing Restrictions: Yes RUE Weight Bearing: Non weight bearing       Mobility Bed Mobility Overal bed mobility: Needs Assistance Bed Mobility: Supine to Sit     Supine to sit: Min assist                 Balance Overall balance assessment: Mild deficits observed, not formally tested         ADL either performed or assessed with clinical judgement   ADL Overall ADL's : Needs assistance/impaired     Grooming: Brushing hair;Wash/dry face;Wash/dry hands;Set up;Sitting Grooming Details (indicate cue type and reason): in recliner Upper Body Bathing: Minimal assistance;Sitting Upper Body Bathing Details (indicate cue type and reason): in recliner with increased time educated on see saw method Lower Body Bathing: Minimal assistance;Sitting/lateral leans Lower Body Bathing Details (indicate cue type and reason): with increased time and education on not attempting to wash front perineal area after wiping the back with new washcloth provided. patient verbalized understanding. Upper Body Dressing : Minimal assistance;Sitting Upper Body Dressing Details (indicate cue type and reason): with education on how to don gown with less movement of RUE to reduce pain level. patient verbalized understanding.     Toilet Transfer: Min guard;Ambulation;Rolling walker (2 wheels) Toilet Transfer Details (indicate cue type and reason): to and from bathroom with increased time Toileting- Clothing Manipulation and Hygiene: Supervision/safety;Sit to/from stand                Cognition Arousal/Alertness: Awake/alert Behavior During Therapy: WFL for tasks assessed/performed Overall Cognitive Status: Within Functional Limits for tasks assessed               Shoulder Instructions Shoulder Instructions Donning/doffing shirt without moving shoulder: Minimal assistance Method for sponge bathing under operated UE: Minimal assistance Donning/doffing sling/immobilizer: Minimal assistance Correct positioning of sling/immobilizer: Minimal assistance ROM for elbow, wrist and digits of operated UE: Modified independent Sling wearing schedule (on at all times/off for ADL's): Minimal assistance Proper  positioning of operated UE when showering:  Minimal assistance Positioning of UE while sleeping: Minimal assistance          Pertinent Vitals/ Pain       Pain Assessment Pain Assessment: Faces Faces Pain Scale: Hurts little more Pain Location: R shoulder Pain Descriptors / Indicators: Operative site guarding, Grimacing Pain Intervention(s): Limited activity within patient's tolerance, Monitored during session, Premedicated before session, Repositioned         Frequency  Min 2X/week        Progress Toward Goals  OT Goals(current goals can now be found in the care plan section)  Progress towards OT goals: Progressing toward goals     Plan Discharge plan remains appropriate       AM-PAC OT "6 Clicks" Daily Activity     Outcome Measure   Help from another person eating meals?: None Help from another person taking care of personal grooming?: A Little Help from another person toileting, which includes using toliet, bedpan, or urinal?: A Little Help from another person bathing (including washing, rinsing, drying)?: A Lot Help from another person to put on and taking off regular upper body clothing?: A Little Help from another person to put on and taking off regular lower body clothing?: A Lot 6 Click Score: 17    End of Session Equipment Utilized During Treatment: Rolling walker (2 wheels)  OT Visit Diagnosis: Unsteadiness on feet (R26.81);Other abnormalities of gait and mobility (R26.89);Muscle weakness (generalized) (M62.81);Pain Pain - Right/Left: Right Pain - part of body: Shoulder   Activity Tolerance Patient tolerated treatment well   Patient Left in chair;with call bell/phone within reach   Nurse Communication Other (comment) (nurse made aware multiple times IV was beeping)        Time: 8366-2947 OT Time Calculation (min): 33 min  Charges: OT General Charges $OT Visit: 1 Visit OT Treatments $Self Care/Home Management : 23-37 mins  Jodi Plowman,  MS Acute Rehabilitation Department Office# [phone]   Jodi Keller 02/12/2022, 2:59 PM

## 2022-02-12 NOTE — Progress Notes (Signed)
Patient clammy, nauseous, and dizzy after ambulating to bathroom with nurse tech. RN came to bedside to assess patient. Patient alert and oriented. Full set of vitals obtained. BP assessed again after patient was standing. Then patient felt the need to sit down. Patient walked to recliner/sat in recliner with nurse tech and RN. Jonelle Sidle, PA notified and stated he would be coming to assess patient.

## 2022-02-12 NOTE — Progress Notes (Signed)
Patient sleeping soundly with cpap on, visitor asleep at bedside, no acute distress noted, will continue to round on and monitor.

## 2022-02-12 NOTE — Progress Notes (Signed)
   Subjective:  Jodi Keller is a 67 y.o. female, 2 Days Post-Op    s/p Procedure(s): REVERSE SHOULDER ARTHROPLASTY   Patient reports pain as mild to moderate.  Reports this morning she was ambulating and started feeling clammy, nauseous, dizzy.  Reports she lives at home alone, her sister is planning on coming on Tuesday to help her out.  She wears by getting up from the bed or chairs without able to push off.  Denies numbness or tingling.  Denies fever or chills, has noted cold sweat while she was dizzy.  Objective:   VITALS:   Vitals:   02/11/22 2232 02/12/22 0515 02/12/22 0806 02/12/22 0808  BP: 116/65 125/71 (!) 119/99 116/83  Pulse: (!) 53 62 (!) 58 71  Resp: '18 18 18   '$ Temp: 98.2 F (36.8 C) (!) 97 F (36.1 C) 98.2 F (36.8 C)   TempSrc: Oral  Oral   SpO2: 97% 98% 99%   Weight:      Height:       In hospital chair NAD   ABD soft Neurovascular intact Sensation intact distally Intact pulses distally Dorsiflexion/Plantar flexion intact Incision: dressing C/D/I Compartment soft Intact Aquacel, no drainage, able to actively range the elbow and hand/wrist.  Deltoid sensation intact.  Bilateral calves soft and supple.  Lab Results  Component Value Date   WBC 10.8 (H) 01/30/2022   HGB 12.5 02/11/2022   HCT 38.1 02/11/2022   MCV 94.4 01/30/2022   PLT 221 01/30/2022   BMET    Component Value Date/Time   NA 139 02/11/2022 0411   K 3.5 02/11/2022 0411   CL 108 02/11/2022 0411   CO2 25 02/11/2022 0411   GLUCOSE 149 (H) 02/11/2022 0411   BUN 17 02/11/2022 0411   CREATININE 0.83 02/11/2022 0411   CALCIUM 8.7 (L) 02/11/2022 0411   GFRNONAA >60 02/11/2022 0411     Assessment/Plan: 2 Days Post-Op   Principal Problem:   S/P shoulder replacement, right   Advance diet Up with therapy Dispo: Hopeful discharge tomorrow.  Had an episode of dizziness, feeling unsteady today.  Still is concerned about ambulating due to her previous ankle fusion, getting up from a  chair/bed.  I think she will benefit from another session of therapy.  Something a little bit more time will help.  Fluid bolus ordered.  Would recommend trying to avoid higher doses of opioids.  Weightbearing Status: NWB RUE , ok for elbow ROM , wrist ROM. Sling on when up and about , sleeping.  DVT Prophylaxis: Aspirin   Faythe Casa 02/12/2022, 9:31 AM  Jonelle Sidle PA-C  Physician Assistant with Dr. Lillia Abed [place]

## 2022-02-12 NOTE — Plan of Care (Signed)
  Problem: Education: Goal: Knowledge of General Education information will improve Description: Including pain rating scale, medication(s)/side effects and non-pharmacologic comfort measures Outcome: Progressing

## 2022-02-13 ENCOUNTER — Encounter (HOSPITAL_COMMUNITY): Payer: Self-pay | Admitting: Orthopedic Surgery

## 2022-02-13 DIAGNOSIS — Z7982 Long term (current) use of aspirin: Secondary | ICD-10-CM | POA: Diagnosis not present

## 2022-02-13 DIAGNOSIS — I10 Essential (primary) hypertension: Secondary | ICD-10-CM | POA: Diagnosis not present

## 2022-02-13 DIAGNOSIS — M75101 Unspecified rotator cuff tear or rupture of right shoulder, not specified as traumatic: Secondary | ICD-10-CM | POA: Diagnosis not present

## 2022-02-13 DIAGNOSIS — E119 Type 2 diabetes mellitus without complications: Secondary | ICD-10-CM | POA: Diagnosis not present

## 2022-02-13 DIAGNOSIS — Z79899 Other long term (current) drug therapy: Secondary | ICD-10-CM | POA: Diagnosis not present

## 2022-02-13 MED ORDER — BISACODYL 10 MG RE SUPP
10.0000 mg | Freq: Once | RECTAL | Status: AC
  Administered 2022-02-13: 10 mg via RECTAL
  Filled 2022-02-13: qty 1

## 2022-02-13 NOTE — Plan of Care (Signed)
  Problem: Education: Goal: Knowledge of the prescribed therapeutic regimen will improve Outcome: Progressing   Problem: Activity: Goal: Ability to tolerate increased activity will improve Outcome: Progressing   Problem: Pain Management: Goal: Pain level will decrease with appropriate interventions Outcome: Progressing   Problem: Safety: Goal: Ability to remain free from injury will improve Outcome: Progressing

## 2022-02-13 NOTE — Progress Notes (Signed)
Occupational Therapy Treatment Patient Details Name: Jodi Keller MRN: [id number] DOB: 1955/12/09 Today's Date: 02/13/2022   History of present illness Patient is a 67 year old female who presented with multiple year history of shoulder pain with imaging revealing end stage arthritic changes. Patient underwent right reverse shoulder arthroplasty on 1/19. PMH: obesity, GERD, bilateral carpal tunnel syndrome, anxiety disorder, uterine prolapse, R ankle fusion 2021   OT comments  Patient was limited in progress today with dizziness episode. Patient requested to wash up at sink in bathroom v.s. sink in room she washed at previous day.  patient upon attempting to wash in standing after extended period of time attempting to void BM reported she felt dizzy and nauseated. patient returned to seated on commode and nurse called into room. patient was providerd with cool wash cloth for back of neck. patients blood pressure was 122/89 mmhg MAP 98, O2 98% on RA and HR 72 bpm. patient returned to bed with supervision and increased time.Patient's discharge plan remains appropriate at this time. OT will continue to follow acutely.     Recommendations for follow up therapy are one component of a multi-disciplinary discharge planning process, led by the attending physician.  Recommendations may be updated based on patient status, additional functional criteria and insurance authorization.    Follow Up Recommendations  Home health OT     Assistance Recommended at Discharge Frequent or constant Supervision/Assistance  Patient can return home with the following  A lot of help with bathing/dressing/bathroom;Assistance with cooking/housework;Direct supervision/assist for medications management;Assist for transportation;Help with stairs or ramp for entrance;Direct supervision/assist for financial management;A little help with walking and/or transfers   Equipment Recommendations  None recommended by OT     Recommendations for Other Services      Precautions / Restrictions Precautions Precautions: Shoulder Type of Shoulder Precautions: per orders no ROM shoulder, ok for hand wrist and elbow. per verbal order Dr.Norris on 1/20 "good for active ROM with ADL tasks, limited WB for balance and functional mobility". Shoulder Interventions: Shoulder sling/immobilizer;At all times;Off for dressing/bathing/exercises Precaution Booklet Issued: Yes (comment) Restrictions Weight Bearing Restrictions: Yes RUE Weight Bearing: Non weight bearing       Mobility Bed Mobility   Bed Mobility: Sit to Supine       Sit to supine: Supervision   General bed mobility comments: with increased time.           ADL either performed or assessed with clinical judgement   ADL Overall ADL's : Needs assistance/impaired       Toilet Transfer: Min guard;Ambulation;Rolling walker (2 wheels) Toilet Transfer Details (indicate cue type and reason): to and from bathroom with increased time.patient on commode for extended period of time attempting to void BM. patient was educated on breathing and positioning. patient was also educated that sitting and straining for too long can  make constipation worse. Toileting- Clothing Manipulation and Hygiene: Supervision/safety;Sit to/from stand Toileting - Clothing Manipulation Details (indicate cue type and reason): with use of grab bar       General ADL Comments: after patient agreed to stand from commode we stood at sink to wash hands pt requested to wash up prior to donning clothing. patient upon attempting to wash in standing reported she felt dizzy and nauseated. patient returned to seated on commode and nurse called into room. patient was providerd with cool wash cloth for back of neck. patients blood pressure was 122/89 mmhg MAP 98, O2 98% on RA and HR 72 bpm. patient returned  to bed with supervision and increased time.      Cognition Arousal/Alertness:  Awake/alert Behavior During Therapy: WFL for tasks assessed/performed Overall Cognitive Status: Within Functional Limits for tasks assessed       General Comments: dtr present                   Pertinent Vitals/ Pain       Pain Assessment Pain Assessment: Faces Faces Pain Scale: Hurts little more Pain Location: R shoulder and hand with iv Pain Descriptors / Indicators: Operative site guarding, Grimacing Pain Intervention(s): Limited activity within patient's tolerance, Repositioned, Monitored during session         Frequency  Min 2X/week        Progress Toward Goals  OT Goals(current goals can now be found in the care plan section)  Progress towards OT goals: Not progressing toward goals - comment (dizzy episode effecting session)     Plan Discharge plan remains appropriate       AM-PAC OT "6 Clicks" Daily Activity     Outcome Measure   Help from another person eating meals?: None Help from another person taking care of personal grooming?: A Little Help from another person toileting, which includes using toliet, bedpan, or urinal?: A Little Help from another person bathing (including washing, rinsing, drying)?: A Lot Help from another person to put on and taking off regular upper body clothing?: A Little Help from another person to put on and taking off regular lower body clothing?: A Lot 6 Click Score: 17    End of Session Equipment Utilized During Treatment: Rolling walker (2 wheels)  OT Visit Diagnosis: Unsteadiness on feet (R26.81);Other abnormalities of gait and mobility (R26.89);Muscle weakness (generalized) (M62.81);Pain Pain - Right/Left: Right Pain - part of body: Shoulder   Activity Tolerance Patient tolerated treatment well   Patient Left in bed;with call bell/phone within reach;with nursing/sitter in room;with family/visitor present   Nurse Communication Other (comment) (in room with dizziness onset after attempting to void)        Time:  3888-2800 OT Time Calculation (min): 37 min  Charges: OT General Charges $OT Visit: 1 Visit OT Treatments $Self Care/Home Management : 23-37 mins  Jodi Plowman, MS Acute Rehabilitation Department Office# [phone]   [name] 02/13/2022, 12:00 PM

## 2022-02-13 NOTE — Plan of Care (Signed)
  Problem: Pain Management: Goal: Pain level will decrease with appropriate interventions Outcome: Progressing   Problem: Coping: Goal: Level of anxiety will decrease Outcome: Progressing

## 2022-02-13 NOTE — Progress Notes (Signed)
RN notified by PT that patient c/o dizziness and nausea while attempting to have BM. RN observed patient sitting on toilet. VS are stable. Patient c/o constipation; offered to contact MD regarding suppository. Patient declines medications, including scheduled ones at this time. Tearful and states she wants to improve so that she can go home. PT and RN assist patient back into bed for a rest/nap before next activity. Ivan Anchors, RN 02/13/22 10:36 AM

## 2022-02-13 NOTE — Progress Notes (Signed)
Physical Therapy Treatment Patient Details Name: Jodi Keller MRN: [id number] DOB: [birth year] Today's Date: 02/13/2022   History of Present Illness Patient is a 67 year old female who presented with multiple year history of shoulder pain with imaging revealing end stage arthritic changes. Patient underwent right reverse shoulder arthroplasty on 1/19. PMH: obesity, GERD, bilateral carpal tunnel syndrome, anxiety disorder, uterine prolapse, R ankle fusion 2021    Jodi Keller Comments    Jodi Keller progressing well. Had another episode of dizziness earlier today however no c/o dizziness during Jodi Keller session. Session focused on practicing up/down curb. See below for details. Jodi Keller ready to d/c with family support as needed from Jodi Keller standpoint.    Recommendations for follow up therapy are one component of a multi-disciplinary discharge planning process, led by the attending physician.  Recommendations may be updated based on patient status, additional functional criteria and insurance authorization.  Follow Up Recommendations  Home health Jodi Keller     Assistance Recommended at Discharge Intermittent Supervision/Assistance  Patient can return home with the following A little help with bathing/dressing/bathroom;Assistance with cooking/housework;Assist for transportation;Help with stairs or ramp for entrance   Equipment Recommendations  None recommended by Jodi Keller    Recommendations for Other Services       Precautions / Restrictions Precautions Precautions: Shoulder Type of Shoulder Precautions: per orders no ROM shoulder, ok for hand wrist and elbow. per verbal order Dr.Norris on 1/20 "good for active ROM with ADL tasks, limited WB for balance and functional mobility". Shoulder Interventions: Shoulder sling/immobilizer;At all times;Off for dressing/bathing/exercises Precaution Booklet Issued: Yes (comment) Restrictions Weight Bearing Restrictions: Yes RUE Weight Bearing: Non weight bearing     Mobility  Bed  Mobility               General bed mobility comments: in bathroom    Transfers   Equipment used: Rolling walker (2 wheels) Transfers: Sit to/from Stand Sit to Stand: Supervision, Modified independent (Device/Increase time)           General transfer comment: Jodi Keller demonstrates good carryover from previous session maintaining NWB on R and pushing up with L UE only    Ambulation/Gait Ambulation/Gait assistance: Supervision, Modified independent (Device/Increase time) Gait Distance (Feet): 30 Feet Assistive device: Rolling walker (2 wheels) Gait Pattern/deviations: Step-through pattern, Decreased stride length, Wide base of support       General Gait Details: supervision for safety   Stairs Stairs: Yes Stairs assistance: Min guard, Min assist Stair Management: One rail Left, No rails, Backwards, Sideways Number of Stairs: 1 (x3) General stair comments: cues for  technique; reviewed using rail on L (as Jodi Keller uses car) and also posterior technique with RW and RW on Jodi Keller L side, sideways; dtr present. Jodi Keller overall min/guard to min assist to steady. cues to avoid pushing with RUE   Wheelchair Mobility    Modified Rankin (Stroke Patients Only)       Balance                                            Cognition Arousal/Alertness: Awake/alert Behavior During Therapy: WFL for tasks assessed/performed Overall Cognitive Status: Within Functional Limits for tasks assessed  Exercises      General Comments        Pertinent Vitals/Pain Pain Assessment Pain Assessment: Faces Faces Pain Scale: Hurts little more Pain Location: R shoulder and hand with iv Pain Descriptors / Indicators: Operative site guarding, Grimacing Pain Intervention(s): Limited activity within patient's tolerance, Monitored during session, Premedicated before session, Repositioned    Home Living                           Prior Function            Jodi Keller Goals (current goals can now be found in the care plan section) Acute Rehab Jodi Keller Goals Jodi Keller Goal Formulation: With patient Time For Goal Achievement: 02/18/22 Potential to Achieve Goals: Good Progress towards Jodi Keller goals: Progressing toward goals    Frequency    Min 3X/week      Jodi Keller Plan Current plan remains appropriate    Co-evaluation              AM-PAC Jodi Keller "6 Clicks" Mobility   Outcome Measure  Help needed turning from your back to your side while in a flat bed without using bedrails?: A Little Help needed moving from lying on your back to sitting on the side of a flat bed without using bedrails?: A Little Help needed moving to and from a bed to a chair (including a wheelchair)?: A Little Help needed standing up from a chair using your arms (e.g., wheelchair or bedside chair)?: A Little Help needed to walk in hospital room?: A Little Help needed climbing 3-5 steps with a railing? : A Little 6 Click Score: 18    End of Session Equipment Utilized During Treatment: Gait belt Activity Tolerance: Patient tolerated treatment well Patient left: with family/visitor present;Other (comment) (bathroom) Nurse Communication: Mobility status Jodi Keller Visit Diagnosis: Other abnormalities of gait and mobility (R26.89)     Time: 1135-1200 Jodi Keller Time Calculation (min) (ACUTE ONLY): 25 min  Charges:  $Gait Training: 23-37 mins                     Jazmine Longshore, Jodi Keller  Acute Rehab Dept [facility]) [phone]  [facility] Weekend Pager [facility] only)  [phone]  02/13/2022    [facility] 02/13/2022, 12:27 PM

## 2022-02-13 NOTE — Plan of Care (Signed)
Problem: Education: Goal: Knowledge of the prescribed therapeutic regimen will improve Outcome: Progressing   Problem: Activity: Goal: Ability to tolerate increased activity will improve Outcome: Progressing   Problem: Pain Management: Goal: Pain level will decrease with appropriate interventions Outcome: [place], RN 02/13/22 8:05 AM

## 2022-02-13 NOTE — Discharge Summary (Addendum)
   In most cases prophylactic antibiotics for Dental procdeures after total joint surgery are not necessary.  Exceptions are as follows:  1. History of prior total joint infection  2. Severely immunocompromised (Organ Transplant, cancer chemotherapy, Rheumatoid biologic meds such as [place])  3. Poorly controlled diabetes (A1C &gt; 8.0, blood glucose over 200)  If you have one of these conditions, contact your surgeon for an antibiotic prescription, prior to your dental procedure. Orthopedic Discharge Summary        Physician Discharge Summary  Patient ID: NIAJA STICKLEY MRN: [id number] DOB/AGE: 31-Dec-1955 67 y.o.  Admit date: 02/10/2022 Discharge date: 02/14/2022  Procedures:  Procedure(s) (LRB): REVERSE SHOULDER ARTHROPLASTY (Right)  Attending Physician:  Dr. Esmond Plants  Admission Diagnoses:   right shoulder cuff arthropathy  Discharge Diagnoses:  right shoulder cuff arthropathy   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   Diabetes mellitus without complication (HCC)    diet controlled   Generalized anxiety disorder 11/17/2014   GERD (gastroesophageal reflux disease)    History of hiatal hernia    Hyperlipidemia 02/25/2011   Hypertension    Morbid obesity ([place])    Sleep apnea    wears cpap   Uterine prolapse     PCP: Biagio Borg, MD   Discharged Condition: fair  Hospital Course:  Patient underwent the above stated procedure on 02/10/2022. Patient tolerated the procedure well and brought to the recovery room in good condition and subsequently to the floor. Patient had an uncomplicated hospital course and was stable for discharge.   Disposition:  with follow up in 2 weeks    Follow-up Information     Netta Cedars, MD. Call in 2 week(s).   Specialty: Orthopedic Surgery Why: call [phone] for appt Contact information: [address] [place] [address] [place] [address] [phone]         Care, [facility] Follow up.   Specialty: Home Health Services Why: Alvis Lemmings will provide PT and OT in the home after discharge. Contact information: [place] [address] [phone]                 Dental Antibiotics:  In most cases prophylactic antibiotics for Dental procdeures after total joint surgery are not necessary.  Exceptions are as follows:  1. History of prior total joint infection  2. Severely immunocompromised (Organ Transplant, cancer chemotherapy, Rheumatoid biologic meds such as [place])  3. Poorly controlled diabetes (A1C &gt; 8.0, blood glucose over 200)  If you have one of these conditions, contact your surgeon for an antibiotic prescription, prior to your dental procedure.        Signed: Ventura Bruns 02/13/2022, 9:47 AM  [facility] is now [employer] [facility] [address] [place], [place], [place] [address] Phone: [employer]

## 2022-02-13 NOTE — Progress Notes (Signed)
   Subjective: 3 Days Post-Op Procedure(s) (LRB): REVERSE SHOULDER ARTHROPLASTY (Right)  Pt feeling better today No more nausea or dizziness Plan for d/c today Patient reports pain as moderate.  Objective:   VITALS:   Vitals:   02/12/22 2029 02/13/22 0449  BP: (!) 115/57 (!) 114/59  Pulse: 63 (!) 56  Resp: 16 16  Temp: 99 F (37.2 C) 97.9 F (36.6 C)  SpO2: 97% 95%    Right shoulder incision healing well Nv intact distally No rashes or edema distally Sling in good position  LABS Recent Labs    02/11/22 0411  HGB 12.5  HCT 38.1    Recent Labs    02/11/22 0411  NA 139  K 3.5  BUN 17  CREATININE 0.83  GLUCOSE 149*     Assessment/Plan: 3 Days Post-Op Procedure(s) (LRB): REVERSE SHOULDER ARTHROPLASTY (Right) Plan to d/c home today F/u in the office in 2 weeks Pain management     Brad Luna [place], [place] is now [facility]  [facility] [facility] [address], [place], [place], [place] [address] Phone: [phone] [email or web address] Facebook  [employer]

## 2022-02-14 DIAGNOSIS — Z79899 Other long term (current) drug therapy: Secondary | ICD-10-CM | POA: Diagnosis not present

## 2022-02-14 DIAGNOSIS — I10 Essential (primary) hypertension: Secondary | ICD-10-CM | POA: Diagnosis not present

## 2022-02-14 DIAGNOSIS — Z7982 Long term (current) use of aspirin: Secondary | ICD-10-CM | POA: Diagnosis not present

## 2022-02-14 DIAGNOSIS — E119 Type 2 diabetes mellitus without complications: Secondary | ICD-10-CM | POA: Diagnosis not present

## 2022-02-14 DIAGNOSIS — M75101 Unspecified rotator cuff tear or rupture of right shoulder, not specified as traumatic: Secondary | ICD-10-CM | POA: Diagnosis not present

## 2022-02-14 NOTE — Progress Notes (Signed)
Orthopedics Progress Note  Subjective: Feeling better this AM. Some pain  Objective:  Vitals:   02/14/22 0044 02/14/22 0430  BP:  138/72  Pulse: 62 62  Resp: 18 16  Temp:  97.8 F (36.6 C)  SpO2: 98% 100%    General: Awake and alert  Musculoskeletal: Right shoulder dressing CDI. No pain with gentle PROM of the shoulder on the right Neurovascularly intact  Lab Results  Component Value Date   WBC 10.8 (H) 01/30/2022   HGB 12.5 02/11/2022   HCT 38.1 02/11/2022   MCV 94.4 01/30/2022   PLT 221 01/30/2022       Component Value Date/Time   NA 139 02/11/2022 0411   K 3.5 02/11/2022 0411   CL 108 02/11/2022 0411   CO2 25 02/11/2022 0411   GLUCOSE 149 (H) 02/11/2022 0411   BUN 17 02/11/2022 0411   CREATININE 0.83 02/11/2022 0411   CALCIUM 8.7 (L) 02/11/2022 0411   GFRNONAA >60 02/11/2022 0411   GFRAA >60 06/09/2015 2005    No results found for: "INR", "PROTIME"  Assessment/Plan: POD #4 s/p Procedure(s): REVERSE SHOULDER ARTHROPLASTY Stable for discharge today. HHPT AND HHOT Follow up in two weeks in the office  Remo Lipps R. Veverly Fells, MD 02/14/2022 7:50 AM

## 2022-02-14 NOTE — Progress Notes (Signed)
Physical Therapy Treatment Patient Details Name: Jodi Keller MRN: [id number] DOB: 08/03/1955 Today's Date: 02/14/2022   History of Present Illness Patient is a 67 year old female who presented with multiple year history of shoulder pain with imaging revealing end stage arthritic changes. Patient underwent right reverse shoulder arthroplasty on 1/19. PMH: obesity, GERD, bilateral carpal tunnel syndrome, anxiety disorder, uterine prolapse, R ankle fusion 2021    PT Comments    Pt is progressing well today, very mild dizziness with longer distance amb. RN reports holding BP meds as BP was soft likely d/t pain meds--possibly this was the reason pt had episodes in early am the last few days. Pt sister will be assisting her at home until Friday. Pt ready to d/c with assist as needed from PT standpoint. HHPT to follow .  Recommendations for follow up therapy are one component of a multi-disciplinary discharge planning process, led by the attending physician.  Recommendations may be updated based on patient status, additional functional criteria and insurance authorization.  Follow Up Recommendations  Home health PT     Assistance Recommended at Discharge Intermittent Supervision/Assistance  Patient can return home with the following A little help with bathing/dressing/bathroom;Assistance with cooking/housework;Assist for transportation;Help with stairs or ramp for entrance   Equipment Recommendations  None recommended by PT    Recommendations for Other Services       Precautions / Restrictions Precautions Precautions: Shoulder Type of Shoulder Precautions: per orders no ROM shoulder, ok for hand wrist and elbow. per verbal order Dr.Norris on 1/20 "good for active ROM with ADL tasks, limited WB for balance and functional mobility". Shoulder Interventions: Shoulder sling/immobilizer;At all times;Off for dressing/bathing/exercises Precaution Booklet Issued: Yes (comment) Restrictions Weight  Bearing Restrictions: Yes RUE Weight Bearing: Non weight bearing     Mobility  Bed Mobility               General bed mobility comments: in recliner on arrival    Transfers Overall transfer level: Needs assistance Equipment used: Rolling walker (2 wheels) Transfers: Sit to/from Stand Sit to Stand: Supervision, Modified independent (Device/Increase time)           General transfer comment: pt demonstrates good carryover from previous session maintaining NWB on R and pushing up with L UE only    Ambulation/Gait Ambulation/Gait assistance: Supervision, Modified independent (Device/Increase time) Gait Distance (Feet): 100 Feet Assistive device: Rolling walker (2 wheels) Gait Pattern/deviations: Step-through pattern, Decreased stride length, Wide base of support       General Gait Details: pt with c/o very mild dizziness near end of distance, no instability or LOB. assisted intermittently with manuevering RW d/t pain R shoulder. pt is compliant with only light use of R UE on RW--no WBing  or pushing   Stairs         General stair comments:  (verbally reviewed technique, pt is able to verbalize and declines practice today)   Wheelchair Mobility    Modified Rankin (Stroke Patients Only)       Balance                                            Cognition Arousal/Alertness: Awake/alert Behavior During Therapy: WFL for tasks assessed/performed Overall Cognitive Status: Within Functional Limits for tasks assessed  Exercises      General Comments        Pertinent Vitals/Pain Pain Assessment Pain Assessment: Faces Faces Pain Scale: Hurts little more Pain Location: R shoulder and hand with iv Pain Descriptors / Indicators: Operative site guarding, Grimacing Pain Intervention(s): Limited activity within patient's tolerance, Monitored during session    Home Living                           Prior Function            PT Goals (current goals can now be found in the care plan section) Acute Rehab PT Goals PT Goal Formulation: With patient Time For Goal Achievement: 02/18/22 Potential to Achieve Goals: Good Progress towards PT goals: Progressing toward goals    Frequency    Min 3X/week      PT Plan Current plan remains appropriate    Co-evaluation              AM-PAC PT "6 Clicks" Mobility   Outcome Measure  Help needed turning from your back to your side while in a flat bed without using bedrails?: A Little Help needed moving from lying on your back to sitting on the side of a flat bed without using bedrails?: A Little Help needed moving to and from a bed to a chair (including a wheelchair)?: A Little Help needed standing up from a chair using your arms (e.g., wheelchair or bedside chair)?: None Help needed to walk in hospital room?: None Help needed climbing 3-5 steps with a railing? : A Little 6 Click Score: 20    End of Session Equipment Utilized During Treatment: Gait belt Activity Tolerance: Patient tolerated treatment well Patient left: in chair;with call bell/phone within reach;with chair alarm set   PT Visit Diagnosis: Other abnormalities of gait and mobility (R26.89)     Time: 1019-1030 PT Time Calculation (min) (ACUTE ONLY): 11 min  Charges:  $Gait Training: 8-22 mins                     Baxter Flattery, PT  Acute Rehab Dept [facility]) [phone]  [facility] Weekend Pager (Saturday/Sunday only)  [phone]  02/14/2022    [facility] 02/14/2022, 10:49 AM

## 2022-02-14 NOTE — Progress Notes (Signed)
Occupational Therapy Treatment Patient Details Name: Jodi Keller MRN: [id number] DOB: 10-12-1955 Today's Date: 02/14/2022   History of present illness Patient is a 67 year old female who presented with multiple year history of shoulder pain with imaging revealing end stage arthritic changes. Patient underwent right reverse shoulder arthroplasty on 1/19. PMH: obesity, GERD, bilateral carpal tunnel syndrome, anxiety disorder, uterine prolapse, R ankle fusion 2021   OT comments  Patient was able to complete UB bathing/dressing with supervision and LB with min A to reach R side. Patient had no dizziness today. Patient was able to complete toileting, brushing teeth and bath/dressing without rest break. Patient would continue to benefit from skilled OT services at this time while admitted and after d/c to address noted deficits in order to improve overall safety and independence in ADLs. Patient's discharge plan remains appropriate at this time. OT will continue to follow acutely.     Recommendations for follow up therapy are one component of a multi-disciplinary discharge planning process, led by the attending physician.  Recommendations may be updated based on patient status, additional functional criteria and insurance authorization.    Follow Up Recommendations  Home health OT     Assistance Recommended at Discharge Frequent or constant Supervision/Assistance  Patient can return home with the following  A lot of help with bathing/dressing/bathroom;Assistance with cooking/housework;Direct supervision/assist for medications management;Assist for transportation;Help with stairs or ramp for entrance;Direct supervision/assist for financial management;A little help with walking and/or transfers   Equipment Recommendations  None recommended by OT       Precautions / Restrictions Precautions Precautions: Shoulder Type of Shoulder Precautions: per orders no ROM shoulder, ok for hand wrist and  elbow. per verbal order Dr.Norris on 1/20 "good for active ROM with ADL tasks, limited WB for balance and functional mobility". Shoulder Interventions: Shoulder sling/immobilizer;At all times;Off for dressing/bathing/exercises Precaution Booklet Issued: Yes (comment) Restrictions Weight Bearing Restrictions: Yes RUE Weight Bearing: Non weight bearing              ADL either performed or assessed with clinical judgement   ADL Overall ADL's : Needs assistance/impaired     Grooming: Brushing hair;Wash/dry face;Wash/dry hands;Set up;Standing Grooming Details (indicate cue type and reason): at sink in room Upper Body Bathing: Min guard;Sitting Upper Body Bathing Details (indicate cue type and reason): in recliner Lower Body Bathing: Min guard;Sitting/lateral leans;Sit to/from stand Lower Body Bathing Details (indicate cue type and reason): in recliner and standing for perineal area. Upper Body Dressing : Sitting;Supervision/safety Upper Body Dressing Details (indicate cue type and reason): with carryover of education on how to don shirt from previous session Lower Body Dressing: Minimal assistance;Sit to/from stand Lower Body Dressing Details (indicate cue type and reason): to pull up on posterior R side and R side. sister to assist at home. educated on using gowns to reduce need to manipulate pants. patient verbalized understanding. Toilet Transfer: Supervision/safety;Ambulation;Rolling walker (2 wheels)   Toileting- Clothing Manipulation and Hygiene: Supervision/safety;Sit to/from stand                Cognition Arousal/Alertness: Awake/alert Behavior During Therapy: WFL for tasks assessed/performed Overall Cognitive Status: Within Functional Limits for tasks assessed             Shoulder Instructions Shoulder Instructions Donning/doffing shirt without moving shoulder: Supervision/safety Method for sponge bathing under operated UE: Supervision/safety Donning/doffing  sling/immobilizer: Supervision/safety Correct positioning of sling/immobilizer: Minimal assistance ROM for elbow, wrist and digits of operated UE: Modified independent Sling wearing schedule (on at  all times/off for ADL's): Supervision/safety Proper positioning of operated UE when showering: Supervision/safety Positioning of UE while sleeping: Supervision/safety          Pertinent Vitals/ Pain       Pain Assessment Pain Assessment: Faces Faces Pain Scale: Hurts little more Pain Location: R shoulder and hand with iv Pain Descriptors / Indicators: Operative site guarding, Grimacing Pain Intervention(s): Limited activity within patient's tolerance, Monitored during session, Repositioned, Premedicated before session, Ice applied  Frequency  Min 2X/week        Progress Toward Goals  OT Goals(current goals can now be found in the care plan section)  Progress towards OT goals: Progressing toward goals     Plan Discharge plan remains appropriate       AM-PAC OT "6 Clicks" Daily Activity     Outcome Measure   Help from another person eating meals?: None Help from another person taking care of personal grooming?: A Little Help from another person toileting, which includes using toliet, bedpan, or urinal?: A Little Help from another person bathing (including washing, rinsing, drying)?: A Little Help from another person to put on and taking off regular upper body clothing?: A Little Help from another person to put on and taking off regular lower body clothing?: A Little 6 Click Score: 19    End of Session Equipment Utilized During Treatment: Rolling walker (2 wheels)  OT Visit Diagnosis: Unsteadiness on feet (R26.81);Other abnormalities of gait and mobility (R26.89);Muscle weakness (generalized) (M62.81);Pain Pain - Right/Left: Right Pain - part of body: Shoulder   Activity Tolerance Patient tolerated treatment well   Patient Left with call bell/phone within reach;in  chair   Nurse Communication Mobility status        Time: 2993-7169 OT Time Calculation (min): 19 min  Charges: OT Treatments $Self Care/Home Management : 8-22 mins  [name], MS Acute Rehabilitation Department Office# [phone]   [name] 02/14/2022, 10:16 AM

## 2022-02-15 ENCOUNTER — Telehealth: Payer: Self-pay

## 2022-02-15 DIAGNOSIS — G5603 Carpal tunnel syndrome, bilateral upper limbs: Secondary | ICD-10-CM | POA: Diagnosis not present

## 2022-02-15 DIAGNOSIS — I10 Essential (primary) hypertension: Secondary | ICD-10-CM | POA: Diagnosis not present

## 2022-02-15 DIAGNOSIS — F411 Generalized anxiety disorder: Secondary | ICD-10-CM | POA: Diagnosis not present

## 2022-02-15 DIAGNOSIS — E785 Hyperlipidemia, unspecified: Secondary | ICD-10-CM | POA: Diagnosis not present

## 2022-02-15 DIAGNOSIS — D649 Anemia, unspecified: Secondary | ICD-10-CM | POA: Diagnosis not present

## 2022-02-15 DIAGNOSIS — K449 Diaphragmatic hernia without obstruction or gangrene: Secondary | ICD-10-CM | POA: Diagnosis not present

## 2022-02-15 DIAGNOSIS — Z471 Aftercare following joint replacement surgery: Secondary | ICD-10-CM | POA: Diagnosis not present

## 2022-02-15 DIAGNOSIS — E119 Type 2 diabetes mellitus without complications: Secondary | ICD-10-CM | POA: Diagnosis not present

## 2022-02-15 DIAGNOSIS — G4730 Sleep apnea, unspecified: Secondary | ICD-10-CM | POA: Diagnosis not present

## 2022-02-15 DIAGNOSIS — Z981 Arthrodesis status: Secondary | ICD-10-CM | POA: Diagnosis not present

## 2022-02-15 DIAGNOSIS — K219 Gastro-esophageal reflux disease without esophagitis: Secondary | ICD-10-CM | POA: Diagnosis not present

## 2022-02-15 DIAGNOSIS — Z6841 Body Mass Index (BMI) 40.0 and over, adult: Secondary | ICD-10-CM | POA: Diagnosis not present

## 2022-02-15 DIAGNOSIS — Z9181 History of falling: Secondary | ICD-10-CM | POA: Diagnosis not present

## 2022-02-15 DIAGNOSIS — Z7982 Long term (current) use of aspirin: Secondary | ICD-10-CM | POA: Diagnosis not present

## 2022-02-15 DIAGNOSIS — M19072 Primary osteoarthritis, left ankle and foot: Secondary | ICD-10-CM | POA: Diagnosis not present

## 2022-02-15 DIAGNOSIS — Z96611 Presence of right artificial shoulder joint: Secondary | ICD-10-CM | POA: Diagnosis not present

## 2022-02-15 NOTE — Telephone Encounter (Signed)
Transition Care Management Follow-up Telephone Call Date of discharge and from where:[facility] 02-14-22 Dx: S/P right shoulder reverse arthroplasty  How have you been since you were released from the hospital? Doing ok  Any questions or concerns? No  Items Reviewed: Did the pt receive and understand the discharge instructions provided? Yes  Medications obtained and verified? Yes  Other? No  Any new allergies since your discharge? No  Dietary orders reviewed? Yes Do you have support at home? Yes   Home Care and Equipment/Supplies: Were home health services ordered? Yes PT/OT If so, what is the name of the agency? [facility] Has the agency set up a time to come to the patient's home? yes Were any new equipment or medical supplies ordered?  Yes: arm sling What is the name of the medical supply agency? hospital Were you able to get the supplies/equipment? yes Do you have any questions related to the use of the equipment or supplies? No  Functional Questionnaire: (I = Independent and D = Dependent) ADLs: I- with assistance   Bathing/Dressing- I- with assistance  Meal Prep- I  Eating-I  Maintaining continence- I  Transferring/Ambulation- I  Managing Meds- I  Follow up appointments reviewed:  PCP Hospital f/u appt confirmed? No . [facility] f/u appt confirmed? Yes  Scheduled to see Dr Veverly Fells  on 02-23-22 @ 315pm. Are transportation arrangements needed? No  If their condition worsens, is the pt aware to call PCP or go to the Emergency Dept.? Yes Was the patient provided with contact information for the PCP's office or ED? Yes Was to pt encouraged to call back with questions or concerns? Yes   Juanda Crumble LPN [place] Direct Dial [phone]

## 2022-02-20 DIAGNOSIS — E119 Type 2 diabetes mellitus without complications: Secondary | ICD-10-CM | POA: Diagnosis not present

## 2022-02-20 DIAGNOSIS — E785 Hyperlipidemia, unspecified: Secondary | ICD-10-CM | POA: Diagnosis not present

## 2022-02-20 DIAGNOSIS — M19072 Primary osteoarthritis, left ankle and foot: Secondary | ICD-10-CM | POA: Diagnosis not present

## 2022-02-20 DIAGNOSIS — I10 Essential (primary) hypertension: Secondary | ICD-10-CM | POA: Diagnosis not present

## 2022-02-20 DIAGNOSIS — K449 Diaphragmatic hernia without obstruction or gangrene: Secondary | ICD-10-CM | POA: Diagnosis not present

## 2022-02-20 DIAGNOSIS — Z7982 Long term (current) use of aspirin: Secondary | ICD-10-CM | POA: Diagnosis not present

## 2022-02-20 DIAGNOSIS — D649 Anemia, unspecified: Secondary | ICD-10-CM | POA: Diagnosis not present

## 2022-02-20 DIAGNOSIS — Z6841 Body Mass Index (BMI) 40.0 and over, adult: Secondary | ICD-10-CM | POA: Diagnosis not present

## 2022-02-20 DIAGNOSIS — G5603 Carpal tunnel syndrome, bilateral upper limbs: Secondary | ICD-10-CM | POA: Diagnosis not present

## 2022-02-20 DIAGNOSIS — Z471 Aftercare following joint replacement surgery: Secondary | ICD-10-CM | POA: Diagnosis not present

## 2022-02-20 DIAGNOSIS — K219 Gastro-esophageal reflux disease without esophagitis: Secondary | ICD-10-CM | POA: Diagnosis not present

## 2022-02-20 DIAGNOSIS — F411 Generalized anxiety disorder: Secondary | ICD-10-CM | POA: Diagnosis not present

## 2022-02-20 DIAGNOSIS — Z981 Arthrodesis status: Secondary | ICD-10-CM | POA: Diagnosis not present

## 2022-02-20 DIAGNOSIS — Z96611 Presence of right artificial shoulder joint: Secondary | ICD-10-CM | POA: Diagnosis not present

## 2022-02-20 DIAGNOSIS — G4730 Sleep apnea, unspecified: Secondary | ICD-10-CM | POA: Diagnosis not present

## 2022-02-21 DIAGNOSIS — D649 Anemia, unspecified: Secondary | ICD-10-CM | POA: Diagnosis not present

## 2022-02-21 DIAGNOSIS — F411 Generalized anxiety disorder: Secondary | ICD-10-CM | POA: Diagnosis not present

## 2022-02-21 DIAGNOSIS — M19072 Primary osteoarthritis, left ankle and foot: Secondary | ICD-10-CM | POA: Diagnosis not present

## 2022-02-21 DIAGNOSIS — Z7982 Long term (current) use of aspirin: Secondary | ICD-10-CM | POA: Diagnosis not present

## 2022-02-21 DIAGNOSIS — G4730 Sleep apnea, unspecified: Secondary | ICD-10-CM | POA: Diagnosis not present

## 2022-02-21 DIAGNOSIS — Z471 Aftercare following joint replacement surgery: Secondary | ICD-10-CM | POA: Diagnosis not present

## 2022-02-21 DIAGNOSIS — E119 Type 2 diabetes mellitus without complications: Secondary | ICD-10-CM | POA: Diagnosis not present

## 2022-02-21 DIAGNOSIS — Z981 Arthrodesis status: Secondary | ICD-10-CM | POA: Diagnosis not present

## 2022-02-21 DIAGNOSIS — I10 Essential (primary) hypertension: Secondary | ICD-10-CM | POA: Diagnosis not present

## 2022-02-21 DIAGNOSIS — Z6841 Body Mass Index (BMI) 40.0 and over, adult: Secondary | ICD-10-CM | POA: Diagnosis not present

## 2022-02-21 DIAGNOSIS — G5603 Carpal tunnel syndrome, bilateral upper limbs: Secondary | ICD-10-CM | POA: Diagnosis not present

## 2022-02-21 DIAGNOSIS — K219 Gastro-esophageal reflux disease without esophagitis: Secondary | ICD-10-CM | POA: Diagnosis not present

## 2022-02-21 DIAGNOSIS — K449 Diaphragmatic hernia without obstruction or gangrene: Secondary | ICD-10-CM | POA: Diagnosis not present

## 2022-02-21 DIAGNOSIS — Z96611 Presence of right artificial shoulder joint: Secondary | ICD-10-CM | POA: Diagnosis not present

## 2022-02-21 DIAGNOSIS — E785 Hyperlipidemia, unspecified: Secondary | ICD-10-CM | POA: Diagnosis not present

## 2022-02-23 DIAGNOSIS — Z981 Arthrodesis status: Secondary | ICD-10-CM | POA: Diagnosis not present

## 2022-02-23 DIAGNOSIS — I10 Essential (primary) hypertension: Secondary | ICD-10-CM | POA: Diagnosis not present

## 2022-02-23 DIAGNOSIS — D649 Anemia, unspecified: Secondary | ICD-10-CM | POA: Diagnosis not present

## 2022-02-23 DIAGNOSIS — Z4789 Encounter for other orthopedic aftercare: Secondary | ICD-10-CM | POA: Diagnosis not present

## 2022-02-23 DIAGNOSIS — Z6841 Body Mass Index (BMI) 40.0 and over, adult: Secondary | ICD-10-CM | POA: Diagnosis not present

## 2022-02-23 DIAGNOSIS — M19072 Primary osteoarthritis, left ankle and foot: Secondary | ICD-10-CM | POA: Diagnosis not present

## 2022-02-23 DIAGNOSIS — Z7982 Long term (current) use of aspirin: Secondary | ICD-10-CM | POA: Diagnosis not present

## 2022-02-23 DIAGNOSIS — G4730 Sleep apnea, unspecified: Secondary | ICD-10-CM | POA: Diagnosis not present

## 2022-02-23 DIAGNOSIS — E119 Type 2 diabetes mellitus without complications: Secondary | ICD-10-CM | POA: Diagnosis not present

## 2022-02-23 DIAGNOSIS — G5603 Carpal tunnel syndrome, bilateral upper limbs: Secondary | ICD-10-CM | POA: Diagnosis not present

## 2022-02-23 DIAGNOSIS — Z471 Aftercare following joint replacement surgery: Secondary | ICD-10-CM | POA: Diagnosis not present

## 2022-02-23 DIAGNOSIS — K449 Diaphragmatic hernia without obstruction or gangrene: Secondary | ICD-10-CM | POA: Diagnosis not present

## 2022-02-23 DIAGNOSIS — Z96611 Presence of right artificial shoulder joint: Secondary | ICD-10-CM | POA: Diagnosis not present

## 2022-02-23 DIAGNOSIS — K219 Gastro-esophageal reflux disease without esophagitis: Secondary | ICD-10-CM | POA: Diagnosis not present

## 2022-02-23 DIAGNOSIS — F411 Generalized anxiety disorder: Secondary | ICD-10-CM | POA: Diagnosis not present

## 2022-02-23 DIAGNOSIS — E785 Hyperlipidemia, unspecified: Secondary | ICD-10-CM | POA: Diagnosis not present

## 2022-02-27 DIAGNOSIS — M19072 Primary osteoarthritis, left ankle and foot: Secondary | ICD-10-CM | POA: Diagnosis not present

## 2022-02-27 DIAGNOSIS — Z471 Aftercare following joint replacement surgery: Secondary | ICD-10-CM | POA: Diagnosis not present

## 2022-02-27 DIAGNOSIS — F411 Generalized anxiety disorder: Secondary | ICD-10-CM | POA: Diagnosis not present

## 2022-02-27 DIAGNOSIS — G4730 Sleep apnea, unspecified: Secondary | ICD-10-CM | POA: Diagnosis not present

## 2022-02-27 DIAGNOSIS — E785 Hyperlipidemia, unspecified: Secondary | ICD-10-CM | POA: Diagnosis not present

## 2022-02-27 DIAGNOSIS — G5603 Carpal tunnel syndrome, bilateral upper limbs: Secondary | ICD-10-CM | POA: Diagnosis not present

## 2022-02-27 DIAGNOSIS — K219 Gastro-esophageal reflux disease without esophagitis: Secondary | ICD-10-CM | POA: Diagnosis not present

## 2022-02-27 DIAGNOSIS — I10 Essential (primary) hypertension: Secondary | ICD-10-CM | POA: Diagnosis not present

## 2022-02-27 DIAGNOSIS — K449 Diaphragmatic hernia without obstruction or gangrene: Secondary | ICD-10-CM | POA: Diagnosis not present

## 2022-02-27 DIAGNOSIS — Z7982 Long term (current) use of aspirin: Secondary | ICD-10-CM | POA: Diagnosis not present

## 2022-02-27 DIAGNOSIS — Z96611 Presence of right artificial shoulder joint: Secondary | ICD-10-CM | POA: Diagnosis not present

## 2022-02-27 DIAGNOSIS — D649 Anemia, unspecified: Secondary | ICD-10-CM | POA: Diagnosis not present

## 2022-02-27 DIAGNOSIS — E119 Type 2 diabetes mellitus without complications: Secondary | ICD-10-CM | POA: Diagnosis not present

## 2022-02-27 DIAGNOSIS — Z981 Arthrodesis status: Secondary | ICD-10-CM | POA: Diagnosis not present

## 2022-02-27 DIAGNOSIS — Z6841 Body Mass Index (BMI) 40.0 and over, adult: Secondary | ICD-10-CM | POA: Diagnosis not present

## 2022-02-28 DIAGNOSIS — E785 Hyperlipidemia, unspecified: Secondary | ICD-10-CM | POA: Diagnosis not present

## 2022-02-28 DIAGNOSIS — E119 Type 2 diabetes mellitus without complications: Secondary | ICD-10-CM | POA: Diagnosis not present

## 2022-02-28 DIAGNOSIS — I10 Essential (primary) hypertension: Secondary | ICD-10-CM | POA: Diagnosis not present

## 2022-02-28 DIAGNOSIS — Z7982 Long term (current) use of aspirin: Secondary | ICD-10-CM | POA: Diagnosis not present

## 2022-02-28 DIAGNOSIS — Z6841 Body Mass Index (BMI) 40.0 and over, adult: Secondary | ICD-10-CM | POA: Diagnosis not present

## 2022-02-28 DIAGNOSIS — F411 Generalized anxiety disorder: Secondary | ICD-10-CM | POA: Diagnosis not present

## 2022-02-28 DIAGNOSIS — Z981 Arthrodesis status: Secondary | ICD-10-CM | POA: Diagnosis not present

## 2022-02-28 DIAGNOSIS — Z471 Aftercare following joint replacement surgery: Secondary | ICD-10-CM | POA: Diagnosis not present

## 2022-02-28 DIAGNOSIS — M19072 Primary osteoarthritis, left ankle and foot: Secondary | ICD-10-CM | POA: Diagnosis not present

## 2022-02-28 DIAGNOSIS — Z96611 Presence of right artificial shoulder joint: Secondary | ICD-10-CM | POA: Diagnosis not present

## 2022-02-28 DIAGNOSIS — K449 Diaphragmatic hernia without obstruction or gangrene: Secondary | ICD-10-CM | POA: Diagnosis not present

## 2022-02-28 DIAGNOSIS — D649 Anemia, unspecified: Secondary | ICD-10-CM | POA: Diagnosis not present

## 2022-02-28 DIAGNOSIS — G4730 Sleep apnea, unspecified: Secondary | ICD-10-CM | POA: Diagnosis not present

## 2022-02-28 DIAGNOSIS — G5603 Carpal tunnel syndrome, bilateral upper limbs: Secondary | ICD-10-CM | POA: Diagnosis not present

## 2022-02-28 DIAGNOSIS — K219 Gastro-esophageal reflux disease without esophagitis: Secondary | ICD-10-CM | POA: Diagnosis not present

## 2022-03-01 ENCOUNTER — Other Ambulatory Visit: Payer: Self-pay | Admitting: Internal Medicine

## 2022-03-02 DIAGNOSIS — F411 Generalized anxiety disorder: Secondary | ICD-10-CM | POA: Diagnosis not present

## 2022-03-02 DIAGNOSIS — M19072 Primary osteoarthritis, left ankle and foot: Secondary | ICD-10-CM | POA: Diagnosis not present

## 2022-03-02 DIAGNOSIS — Z471 Aftercare following joint replacement surgery: Secondary | ICD-10-CM | POA: Diagnosis not present

## 2022-03-02 DIAGNOSIS — I10 Essential (primary) hypertension: Secondary | ICD-10-CM | POA: Diagnosis not present

## 2022-03-02 DIAGNOSIS — Z6841 Body Mass Index (BMI) 40.0 and over, adult: Secondary | ICD-10-CM | POA: Diagnosis not present

## 2022-03-02 DIAGNOSIS — E785 Hyperlipidemia, unspecified: Secondary | ICD-10-CM | POA: Diagnosis not present

## 2022-03-02 DIAGNOSIS — Z981 Arthrodesis status: Secondary | ICD-10-CM | POA: Diagnosis not present

## 2022-03-02 DIAGNOSIS — G5603 Carpal tunnel syndrome, bilateral upper limbs: Secondary | ICD-10-CM | POA: Diagnosis not present

## 2022-03-02 DIAGNOSIS — D649 Anemia, unspecified: Secondary | ICD-10-CM | POA: Diagnosis not present

## 2022-03-02 DIAGNOSIS — Z96611 Presence of right artificial shoulder joint: Secondary | ICD-10-CM | POA: Diagnosis not present

## 2022-03-02 DIAGNOSIS — K449 Diaphragmatic hernia without obstruction or gangrene: Secondary | ICD-10-CM | POA: Diagnosis not present

## 2022-03-02 DIAGNOSIS — E119 Type 2 diabetes mellitus without complications: Secondary | ICD-10-CM | POA: Diagnosis not present

## 2022-03-02 DIAGNOSIS — K219 Gastro-esophageal reflux disease without esophagitis: Secondary | ICD-10-CM | POA: Diagnosis not present

## 2022-03-02 DIAGNOSIS — G4730 Sleep apnea, unspecified: Secondary | ICD-10-CM | POA: Diagnosis not present

## 2022-03-02 DIAGNOSIS — Z7982 Long term (current) use of aspirin: Secondary | ICD-10-CM | POA: Diagnosis not present

## 2022-03-07 ENCOUNTER — Telehealth: Payer: Self-pay | Admitting: Internal Medicine

## 2022-03-07 DIAGNOSIS — K449 Diaphragmatic hernia without obstruction or gangrene: Secondary | ICD-10-CM | POA: Diagnosis not present

## 2022-03-07 DIAGNOSIS — M19072 Primary osteoarthritis, left ankle and foot: Secondary | ICD-10-CM | POA: Diagnosis not present

## 2022-03-07 DIAGNOSIS — I10 Essential (primary) hypertension: Secondary | ICD-10-CM | POA: Diagnosis not present

## 2022-03-07 DIAGNOSIS — G4730 Sleep apnea, unspecified: Secondary | ICD-10-CM | POA: Diagnosis not present

## 2022-03-07 DIAGNOSIS — Z981 Arthrodesis status: Secondary | ICD-10-CM | POA: Diagnosis not present

## 2022-03-07 DIAGNOSIS — Z6841 Body Mass Index (BMI) 40.0 and over, adult: Secondary | ICD-10-CM | POA: Diagnosis not present

## 2022-03-07 DIAGNOSIS — E785 Hyperlipidemia, unspecified: Secondary | ICD-10-CM | POA: Diagnosis not present

## 2022-03-07 DIAGNOSIS — F411 Generalized anxiety disorder: Secondary | ICD-10-CM | POA: Diagnosis not present

## 2022-03-07 DIAGNOSIS — D649 Anemia, unspecified: Secondary | ICD-10-CM | POA: Diagnosis not present

## 2022-03-07 DIAGNOSIS — G5603 Carpal tunnel syndrome, bilateral upper limbs: Secondary | ICD-10-CM | POA: Diagnosis not present

## 2022-03-07 DIAGNOSIS — E119 Type 2 diabetes mellitus without complications: Secondary | ICD-10-CM | POA: Diagnosis not present

## 2022-03-07 DIAGNOSIS — Z96611 Presence of right artificial shoulder joint: Secondary | ICD-10-CM | POA: Diagnosis not present

## 2022-03-07 DIAGNOSIS — Z7982 Long term (current) use of aspirin: Secondary | ICD-10-CM | POA: Diagnosis not present

## 2022-03-07 DIAGNOSIS — Z471 Aftercare following joint replacement surgery: Secondary | ICD-10-CM | POA: Diagnosis not present

## 2022-03-07 DIAGNOSIS — K219 Gastro-esophageal reflux disease without esophagitis: Secondary | ICD-10-CM | POA: Diagnosis not present

## 2022-03-07 NOTE — Telephone Encounter (Signed)
See below

## 2022-03-07 NOTE — Telephone Encounter (Signed)
[place] for taking coricidin  Thanks for other info and we will address at next visit

## 2022-03-07 NOTE — Telephone Encounter (Signed)
Herbert Deaner with [employer] calls today in regards to a couple FYIs for PT.  PT is being seen by him in an at home setting after their shoulder surgery. PT appears to be diabetic but has no monitor at all at home. PT stated she did not think to track it as her lab results had came back fine from [place].  Clair Gulling had checked these lab results himself on PT's MyChart and had viewed them as elevated.  While there Clair Gulling witnessed PT experiencing shaking episodes which PT stated she did not believe it was related to blood sugar.  Today's heart rate with PT was one beats out of 20/irregular.  PT has had a cold for the last week and has been taking cordicin for this. Clair Gulling wanted to confirm and make sure this would be fine.  CB for Jim: [phone]

## 2022-03-08 NOTE — Telephone Encounter (Signed)
Notified pt with MD response.Marland KitchenJohny Keller

## 2022-03-09 DIAGNOSIS — Z981 Arthrodesis status: Secondary | ICD-10-CM | POA: Diagnosis not present

## 2022-03-09 DIAGNOSIS — I10 Essential (primary) hypertension: Secondary | ICD-10-CM | POA: Diagnosis not present

## 2022-03-09 DIAGNOSIS — K449 Diaphragmatic hernia without obstruction or gangrene: Secondary | ICD-10-CM | POA: Diagnosis not present

## 2022-03-09 DIAGNOSIS — Z7982 Long term (current) use of aspirin: Secondary | ICD-10-CM | POA: Diagnosis not present

## 2022-03-09 DIAGNOSIS — M19072 Primary osteoarthritis, left ankle and foot: Secondary | ICD-10-CM | POA: Diagnosis not present

## 2022-03-09 DIAGNOSIS — G5603 Carpal tunnel syndrome, bilateral upper limbs: Secondary | ICD-10-CM | POA: Diagnosis not present

## 2022-03-09 DIAGNOSIS — E785 Hyperlipidemia, unspecified: Secondary | ICD-10-CM | POA: Diagnosis not present

## 2022-03-09 DIAGNOSIS — F411 Generalized anxiety disorder: Secondary | ICD-10-CM | POA: Diagnosis not present

## 2022-03-09 DIAGNOSIS — Z6841 Body Mass Index (BMI) 40.0 and over, adult: Secondary | ICD-10-CM | POA: Diagnosis not present

## 2022-03-09 DIAGNOSIS — G4730 Sleep apnea, unspecified: Secondary | ICD-10-CM | POA: Diagnosis not present

## 2022-03-09 DIAGNOSIS — E119 Type 2 diabetes mellitus without complications: Secondary | ICD-10-CM | POA: Diagnosis not present

## 2022-03-09 DIAGNOSIS — D649 Anemia, unspecified: Secondary | ICD-10-CM | POA: Diagnosis not present

## 2022-03-09 DIAGNOSIS — Z471 Aftercare following joint replacement surgery: Secondary | ICD-10-CM | POA: Diagnosis not present

## 2022-03-09 DIAGNOSIS — K219 Gastro-esophageal reflux disease without esophagitis: Secondary | ICD-10-CM | POA: Diagnosis not present

## 2022-03-09 DIAGNOSIS — Z96611 Presence of right artificial shoulder joint: Secondary | ICD-10-CM | POA: Diagnosis not present

## 2022-03-15 DIAGNOSIS — K080 Exfoliation of teeth due to systemic causes: Secondary | ICD-10-CM | POA: Diagnosis not present

## 2022-03-21 ENCOUNTER — Encounter: Payer: Self-pay | Admitting: Internal Medicine

## 2022-03-21 ENCOUNTER — Ambulatory Visit (INDEPENDENT_AMBULATORY_CARE_PROVIDER_SITE_OTHER): Payer: Medicare Other | Admitting: Internal Medicine

## 2022-03-21 VITALS — BP 126/74 | HR 65 | Temp 97.8°F | Ht 67.0 in | Wt 315.0 lb

## 2022-03-21 DIAGNOSIS — E1165 Type 2 diabetes mellitus with hyperglycemia: Secondary | ICD-10-CM | POA: Diagnosis not present

## 2022-03-21 DIAGNOSIS — N926 Irregular menstruation, unspecified: Secondary | ICD-10-CM | POA: Insufficient documentation

## 2022-03-21 DIAGNOSIS — G5603 Carpal tunnel syndrome, bilateral upper limbs: Secondary | ICD-10-CM | POA: Diagnosis not present

## 2022-03-21 DIAGNOSIS — M19072 Primary osteoarthritis, left ankle and foot: Secondary | ICD-10-CM | POA: Diagnosis not present

## 2022-03-21 DIAGNOSIS — Z981 Arthrodesis status: Secondary | ICD-10-CM | POA: Diagnosis not present

## 2022-03-21 DIAGNOSIS — L821 Other seborrheic keratosis: Secondary | ICD-10-CM | POA: Insufficient documentation

## 2022-03-21 DIAGNOSIS — Z471 Aftercare following joint replacement surgery: Secondary | ICD-10-CM | POA: Diagnosis not present

## 2022-03-21 DIAGNOSIS — F411 Generalized anxiety disorder: Secondary | ICD-10-CM | POA: Diagnosis not present

## 2022-03-21 DIAGNOSIS — E7800 Pure hypercholesterolemia, unspecified: Secondary | ICD-10-CM

## 2022-03-21 DIAGNOSIS — Z7982 Long term (current) use of aspirin: Secondary | ICD-10-CM | POA: Diagnosis not present

## 2022-03-21 DIAGNOSIS — E785 Hyperlipidemia, unspecified: Secondary | ICD-10-CM | POA: Diagnosis not present

## 2022-03-21 DIAGNOSIS — K449 Diaphragmatic hernia without obstruction or gangrene: Secondary | ICD-10-CM | POA: Diagnosis not present

## 2022-03-21 DIAGNOSIS — Z96611 Presence of right artificial shoulder joint: Secondary | ICD-10-CM | POA: Diagnosis not present

## 2022-03-21 DIAGNOSIS — K219 Gastro-esophageal reflux disease without esophagitis: Secondary | ICD-10-CM | POA: Diagnosis not present

## 2022-03-21 DIAGNOSIS — Z6841 Body Mass Index (BMI) 40.0 and over, adult: Secondary | ICD-10-CM | POA: Diagnosis not present

## 2022-03-21 DIAGNOSIS — E119 Type 2 diabetes mellitus without complications: Secondary | ICD-10-CM | POA: Diagnosis not present

## 2022-03-21 DIAGNOSIS — Z9181 History of falling: Secondary | ICD-10-CM | POA: Diagnosis not present

## 2022-03-21 DIAGNOSIS — I10 Essential (primary) hypertension: Secondary | ICD-10-CM | POA: Diagnosis not present

## 2022-03-21 DIAGNOSIS — G4730 Sleep apnea, unspecified: Secondary | ICD-10-CM | POA: Diagnosis not present

## 2022-03-21 DIAGNOSIS — N1831 Chronic kidney disease, stage 3a: Secondary | ICD-10-CM

## 2022-03-21 DIAGNOSIS — D649 Anemia, unspecified: Secondary | ICD-10-CM | POA: Diagnosis not present

## 2022-03-21 DIAGNOSIS — N950 Postmenopausal bleeding: Secondary | ICD-10-CM | POA: Insufficient documentation

## 2022-03-21 MED ORDER — TIRZEPATIDE 2.5 MG/0.5ML ~~LOC~~ SOAJ: 2.5000 mg | SUBCUTANEOUS | 11 refills | Status: DC

## 2022-03-21 NOTE — Patient Instructions (Addendum)
Please take all new medication as prescribed - the mounjaro 2.5 mg  We can consider Wilder [facility]ade it the mounjaro is not covered  Please continue all other medications as before, and refills have been done if requested.  Please have the pharmacy call with any other refills you may need.  Please continue your efforts at being more active, low cholesterol diet, and weight control.  Please keep your appointments with your specialists as you may have planned  Please make an Appointment to return in 6 months, or sooner if needed

## 2022-03-21 NOTE — Progress Notes (Signed)
Patient ID: Jodi Keller, female   DOB: 07/11/1955, 66 y.o.   MRN: [id number]        Chief Complaint: follow up HLD and DM, obesity, ckd 3a, htn       HPI:  Jodi Keller is a 67 y.o. female here overall doing ok, Pt denies chest pain, increased sob or doe, wheezing, orthopnea, PND, increased LE swelling, palpitations, dizziness or syncope.   Pt denies polydipsia, polyuria, or new focal neuro s/s.    Pt denies fever, wt loss, night sweats, loss of appetite, or other constitutional symptoms  Has been unable to lose signficiant wt with diet and exercise  Also pt is S/p right shoulder reverse surgury jan 2024, now finishtng PT  Has been less active recently due to surgury and a cold like illness.  Still has bilateral ear pressure.  Couldn't drive or go anywhere for several wks.  Plans to restart the [employer] soon.   Pt denies fever, wt loss, night sweats, loss of appetite, or other constitutional symptoms  Wt Readings from Last 3 Encounters:  03/21/22 (!) 315 lb (142.9 kg)  02/10/22 (!) 315 lb 4.1 oz (143 kg)  01/30/22 (!) 317 lb (143.8 kg)   BP Readings from Last 3 Encounters:  03/21/22 126/74  02/14/22 108/63  01/30/22 103/60         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   Diabetes mellitus without complication ([place])    diet controlled   Essential hypertension 11/13/2006   Formatting of this note might be different from the original. Formatting of this note might be different from the original. Qualifier: Diagnosis of By: Tiney Rouge CMA, Mobile: Formatting of this note might be different from the original. stable overall by history and exam, recent data reviewed with pt, and pt to continue medical treatment as before,  to f/u any worsening sympto   Generalized anxiety disorder 11/17/2014   GERD (gastroesophageal reflux disease)    History of hiatal  hernia    Hyperlipidemia 02/25/2011   Hypertension    Morbid obesity ([place])    Nausea vomiting and diarrhea 04/21/2020   Sleep apnea    wears cpap   Uterine prolapse    Past Surgical History:  Procedure Laterality Date   ANKLE FUSION Right    BALLOON DILATION N/A 12/29/2019   Procedure: BALLOON DILATION;  Surgeon: Irene Shipper, MD;  Location: [facility];  Service: Endoscopy;  Laterality: N/A;   CESAREAN SECTION     COLONOSCOPY WITH PROPOFOL N/A 12/29/2019   Procedure: COLONOSCOPY WITH PROPOFOL;  Surgeon: Irene Shipper, MD;  Location: [facility];  Service: Endoscopy;  Laterality: N/A;   ESOPHAGOGASTRODUODENOSCOPY (EGD) WITH PROPOFOL N/A 12/29/2019   Procedure: ESOPHAGOGASTRODUODENOSCOPY (EGD) WITH PROPOFOL;  Surgeon: Irene Shipper, MD;  Location: [facility];  Service: Endoscopy;  Laterality: N/A;   EYE SURGERY     cataract surgery per left eye    FOOT SURGERY     REVERSE SHOULDER ARTHROPLASTY Right 02/10/2022   Procedure: REVERSE SHOULDER ARTHROPLASTY;  Surgeon: Netta Cedars, MD;  Location: [facility];  Service: Orthopedics;  Laterality: Right;  120 min choice and interscalene block    reports that she has never smoked. She has never used smokeless tobacco. She reports that she does not currently use alcohol. She reports that she does not use drugs. family history includes Cancer  in her father, sister, and sister; Dementia in her mother; Diabetes in her maternal grandmother; Thyroid disease in an other family member. No Active Allergies Current Outpatient Medications on File Prior to Visit  Medication Sig Dispense Refill   Ascorbic Acid (VITAMIN C PO) Take 1 tablet by mouth at bedtime.     aspirin 81 MG chewable tablet Chew 1 tablet (81 mg total) by mouth daily. (Patient taking differently: Chew 81 mg by mouth at bedtime.) 30 tablet 0   atorvastatin (LIPITOR) 10 MG tablet TAKE 1 TABLET BY MOUTH EVERY DAY 90 tablet 1   benazepril (LOTENSIN) 20 MG tablet TAKE 1 TABLET BY MOUTH EVERY  DAY 90 tablet 1   CALCIUM PO Take 1 tablet by mouth at bedtime.     cholecalciferol (VITAMIN D) 1000 units tablet Take 1 tablet (1,000 Units total) by mouth daily as needed. Often forgets to take and doesn't know strength 30 tablet 3   clotrimazole-betamethasone (LOTRISONE) cream USE AS DIRECTED TWICE DAILY AS NEEDED (Patient taking differently: Apply 1 application  topically daily as needed (Rash).) 15 g 3   diclofenac (VOLTAREN) 75 MG EC tablet Take 75 mg by mouth 2 (two) times daily.     hydrochlorothiazide (MICROZIDE) 12.5 MG capsule TAKE 1 CAPSULE (12.5 MG TOTAL) BY MOUTH AT BEDTIME. 90 capsule 2   HYDROcodone-acetaminophen (NORCO) 5-325 MG tablet Take 1-2 tablets by mouth every 6 (six) hours as needed for moderate pain. 30 tablet 0   meloxicam (MOBIC) 15 MG tablet Take 15 mg by mouth daily.     methocarbamol (ROBAXIN) 500 MG tablet Take 1 tablet (500 mg total) by mouth every 8 (eight) hours as needed for muscle spasms. 40 tablet 1   Multiple Vitamins-Minerals (MULTIVITAMIN & MINERAL PO) Take 1 tablet by mouth at bedtime.      mupirocin ointment (BACTROBAN) 2 % Apply 1 Application topically 2 (two) times daily.     NON FORMULARY Pt uses a cpap nightly     nystatin cream (MYCOSTATIN) APPLY TO AFFECTED AREA TWICE A DAY 30 g 3   nystatin powder Apply 1 application topically 3 (three) times daily. (Patient taking differently: Apply 1 application  topically daily as needed (irritation).) 60 g 3   omeprazole (PRILOSEC) 20 MG capsule Take 1 capsule (20 mg total) by mouth 2 (two) times daily before a meal. (Patient taking differently: Take 20 mg by mouth daily.) 1 capsule 0   ondansetron (ZOFRAN) 4 MG tablet Take 1 tablet (4 mg total) by mouth every 4 (four) hours as needed (severe nausea). 30 tablet 1   ondansetron (ZOFRAN) 4 MG tablet Take 1 tablet (4 mg total) by mouth every 8 (eight) hours as needed for nausea, vomiting or refractory nausea / vomiting. 30 tablet 1   Sulfacetamide Sodium, Acne, 10 %  LOTN Apply 1 application topically daily as needed. 118 mL 5   No current facility-administered medications on file prior to visit.        ROS:  All others reviewed and negative.  Objective        PE:  BP 126/74 (BP Location: Right Arm, Patient Position: Sitting, Cuff Size: Large)   Pulse 65   Temp 97.8 F (36.6 C) (Oral)   Ht '5\' 7"'$  (1.702 m)   Wt (!) 315 lb (142.9 kg)   LMP 01/12/2011   SpO2 96%   BMI 49.34 kg/m                 Constitutional: Pt appears in  NAD               HENT: Head: NCAT.                Right Ear: External ear normal.                 Left Ear: External ear normal.                Eyes: . Pupils are equal, round, and reactive to light. Conjunctivae and EOM are normal               Nose: without d/c or deformity               Neck: Neck supple. Gross normal ROM               Cardiovascular: Normal rate and regular rhythm.                 Pulmonary/Chest: Effort normal and breath sounds without rales or wheezing.                Abd:  Soft, NT, ND, + BS, no organomegaly               Neurological: Pt is alert. At baseline orientation, motor grossly intact               Skin: Skin is warm. No rashes, no other new lesions, LE edema - none               Psychiatric: Pt behavior is normal without agitation   Micro: none  Cardiac tracings I have personally interpreted today:  none  Pertinent Radiological findings (summarize): none   Lab Results  Component Value Date   WBC 10.8 (H) 01/30/2022   HGB 12.5 02/11/2022   HCT 38.1 02/11/2022   PLT 221 01/30/2022   GLUCOSE 149 (H) 02/11/2022   CHOL 130 07/22/2021   TRIG 134.0 07/22/2021   HDL 47.50 07/22/2021   LDLCALC 55 07/22/2021   ALT 14 07/22/2021   AST 18 07/22/2021   NA 139 02/11/2022   K 3.5 02/11/2022   CL 108 02/11/2022   CREATININE 0.83 02/11/2022   BUN 17 02/11/2022   CO2 25 02/11/2022   TSH 3.68 07/22/2021   HGBA1C 8.3 (H) 01/30/2022   MICROALBUR 3.8 (H) 07/22/2021   Assessment/Plan:   Jodi Keller is a 67 y.o. White or Caucasian [1] female with  has a past medical history of Anemia, Bilateral carpal tunnel syndrome (12/16/2010), Degenerative joint disease of ankle, left (12/16/2010), Diabetes mellitus without complication ([place]), Essential hypertension (11/13/2006), Generalized anxiety disorder (11/17/2014), GERD (gastroesophageal reflux disease), History of hiatal hernia, Hyperlipidemia (02/25/2011), Hypertension, Morbid obesity ([place]), Nausea vomiting and diarrhea (04/21/2020), Sleep apnea, and Uterine prolapse.  CKD (chronic kidney disease) stage 3, GFR 30-59 ml/min (H[facility]) Lab Results  Component Value Date   CREATININE 0.83 02/11/2022   Stable overall, cont to avoid nephrotoxins   Diabetes ([place]) Lab Results  Component Value Date   HGBA1C 8.3 (H) 01/30/2022   Uncontrolled new worsening, and in light of obesity will add mounjaro 2.5 gm weekly ( or farxiga if not covered by insurance), pt to continue current medical treatment   Hyperlipidemia Lab Results  Component Value Date   [place] 55 07/22/2021   Stable, pt to continue current statin lipitor 10 mg qd   Morbid obesity For mounjaro as abov  HTN (hypertension) BP Readings from Last 3 Encounters:  03/21/22 126/74  02/14/22 108/63  01/30/22 103/60   Stable, pt to continue medical treatment lotensin 20 mg qd, hct 12.g mg qd   Followup: Return in about 6 months (around 09/19/2022).  Cathlean Cower, MD 03/25/2022 2:19 PM [facility]

## 2022-03-22 ENCOUNTER — Telehealth: Payer: Self-pay

## 2022-03-22 NOTE — Telephone Encounter (Signed)
Pt PA for [facility]    [id number]

## 2022-03-25 ENCOUNTER — Encounter: Payer: Self-pay | Admitting: Internal Medicine

## 2022-03-25 DIAGNOSIS — I10 Essential (primary) hypertension: Secondary | ICD-10-CM | POA: Insufficient documentation

## 2022-03-25 NOTE — Assessment & Plan Note (Signed)
Lab Results  Component Value Date   CREATININE 0.83 02/11/2022   Stable overall, cont to avoid nephrotoxins

## 2022-03-25 NOTE — Assessment & Plan Note (Signed)
BP Readings from Last 3 Encounters:  03/21/22 126/74  02/14/22 108/63  01/30/22 103/60   Stable, pt to continue medical treatment lotensin 20 mg qd, hct 12.g mg qd

## 2022-03-25 NOTE — Assessment & Plan Note (Signed)
For mounjaro as [employer]

## 2022-03-25 NOTE — Assessment & Plan Note (Signed)
Lab Results  Component Value Date   LDLCALC 55 07/22/2021   Stable, pt to continue current statin lipitor 10 mg qd

## 2022-03-25 NOTE — Assessment & Plan Note (Signed)
Lab Results  Component Value Date   HGBA1C 8.3 (H) 01/30/2022   Uncontrolled new worsening, and in light of obesity will add mounjaro 2.5 gm weekly ( or farxiga if not covered by insurance), pt to continue current medical treatment

## 2022-03-28 DIAGNOSIS — Z4789 Encounter for other orthopedic aftercare: Secondary | ICD-10-CM | POA: Diagnosis not present

## 2022-04-06 DIAGNOSIS — M25511 Pain in right shoulder: Secondary | ICD-10-CM | POA: Diagnosis not present

## 2022-04-06 DIAGNOSIS — R2689 Other abnormalities of gait and mobility: Secondary | ICD-10-CM | POA: Diagnosis not present

## 2022-04-09 IMAGING — RF DG UGI W SINGLE CM
8 series · 11 of 11 positions shown · IV contrast (omnipaque)
Comparison: No priors.

CLINICAL DATA: 64-year-old female with history of epigastric pain
following upper and lower endoscopy yesterday. Evaluate for
perforation.

EXAM:
WATER SOLUBLE UPPER GI SERIES
TECHNIQUE: Single-column upper GI series was performed using water soluble
contrast.
CONTRAST:  50mL OMNIPAQUE IOHEXOL 300 MG/ML SOLN, 50mL OMNIPAQUE
IOHEXOL 300 MG/ML SOLN

[Series 1: fluoro_barium singleshot_bw · 0.17mm/px · 1 of 1 slices shown (1 of 3)]
[im 1/1]
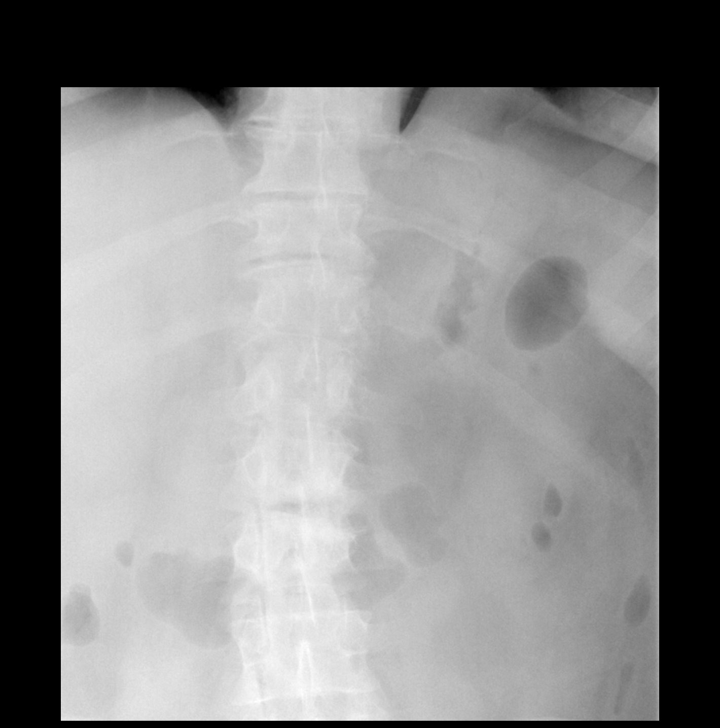

[Series 2: fluoro_barium singleshot_bw · 0.17mm/px · 1 of 1 slices shown (2 of 3)]
[im 1/1]
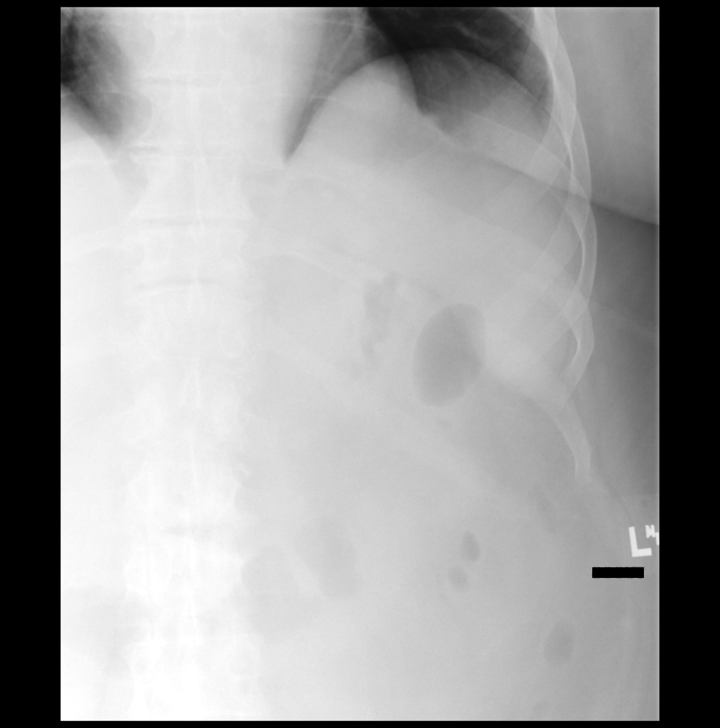

[Series 3: fluoro_barium singleshot_bw · 0.17mm/px · 1 of 1 slices shown (3 of 3)]
[im 1/1]
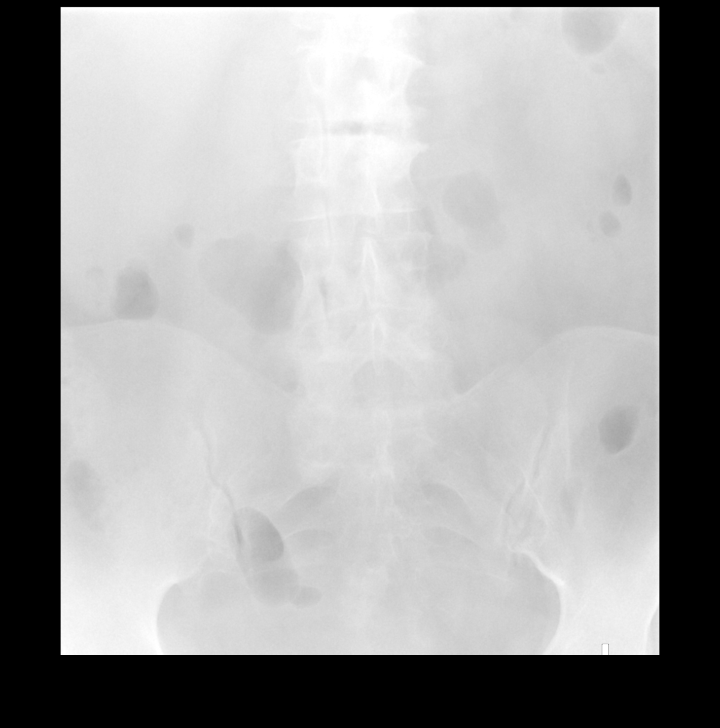

[Series 4: cp_standard · 0.52mm/px · 4 of 71 frames shown]
[frame 11/71]
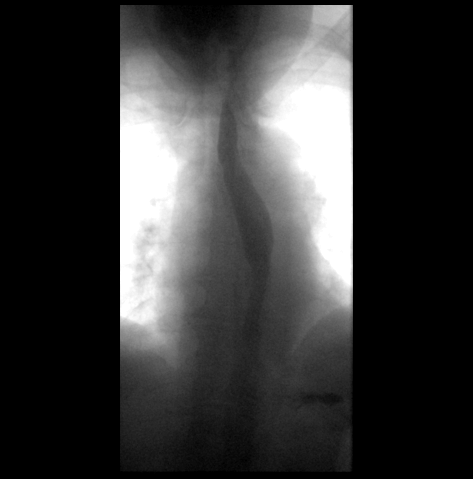
[frame 36/71]
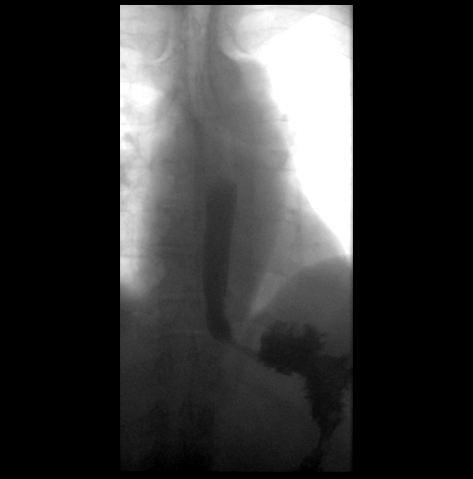
[frame 61/71]
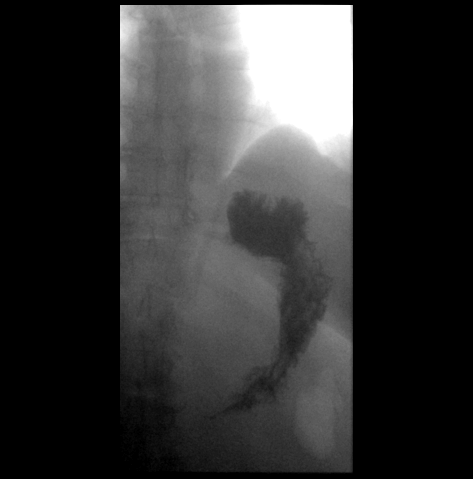
[frame 71/71]
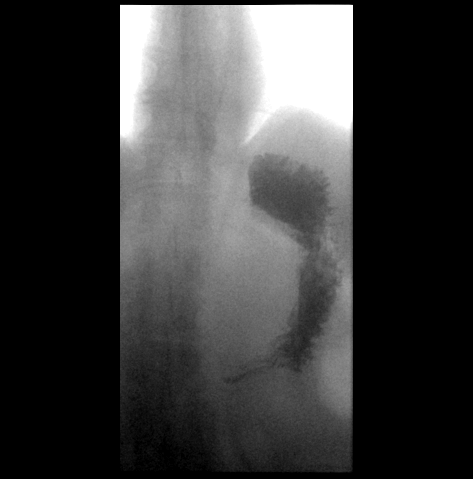

[Series 5: fluoro_barium 2fps_bw · 0.17mm/px · 1 of 1 slices shown (1 of 4)]
[im 1/1]
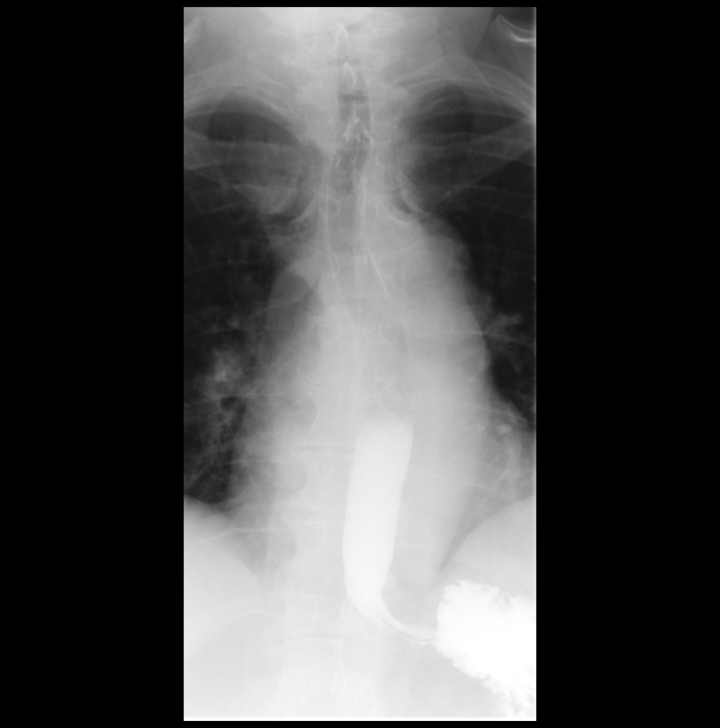

[Series 6: fluoro_barium 2fps_bw · 0.17mm/px · 1 of 1 slices shown (2 of 4)]
[im 1/1]
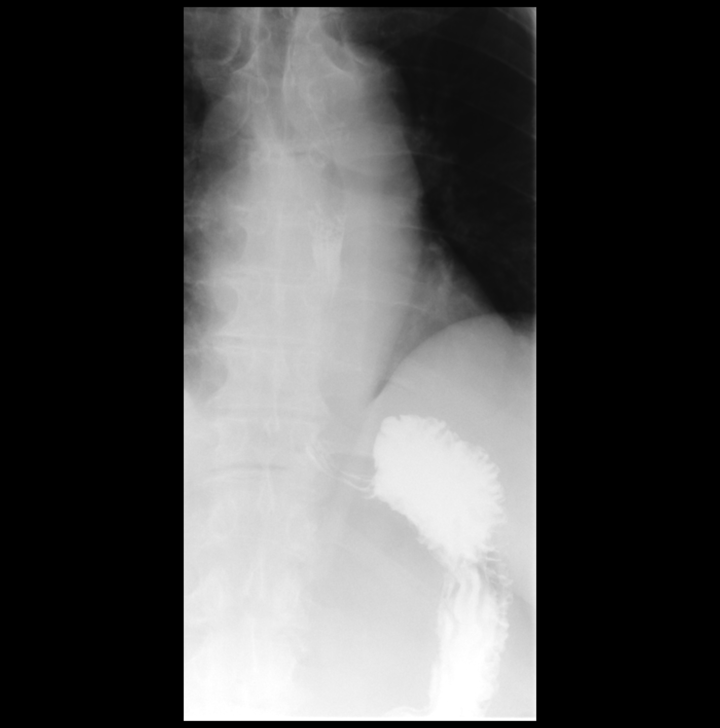

[Series 7: fluoro_barium 2fps_bw · 0.17mm/px · 1 of 1 slices shown (3 of 4)]
[im 1/1]
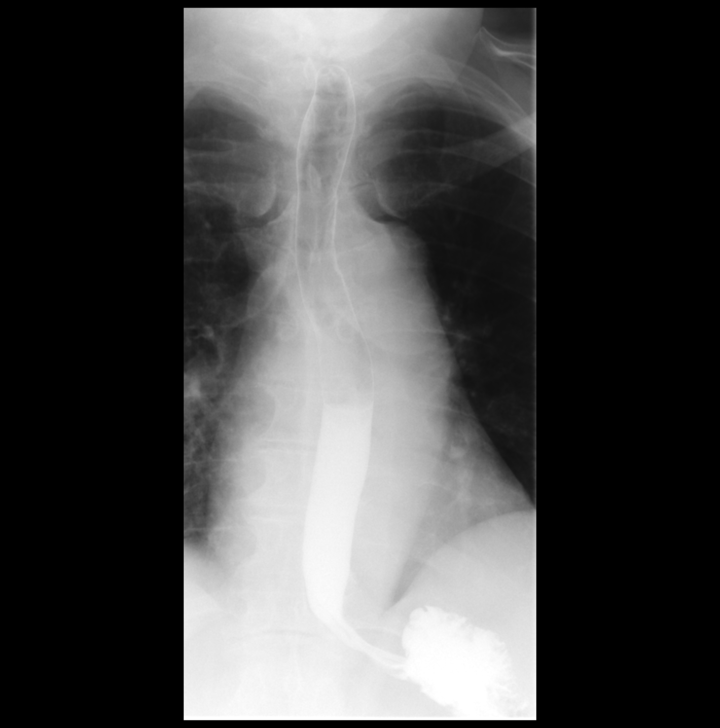

[Series 8: fluoro_barium 2fps_bw · 0.17mm/px · 1 of 1 slices shown (4 of 4)]
[im 1/1]
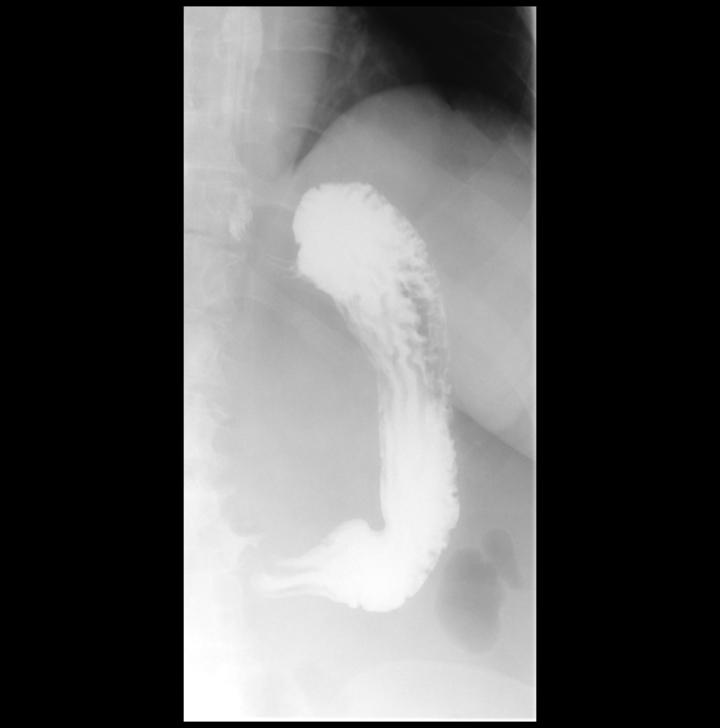

[11 of 11 positions shown; findings below may reference images not displayed]

FLUOROSCOPY TIME:  Fluoroscopy Time:  1 minutes and 30 seconds

Radiation Exposure Index (if provided by the fluoroscopic device):
42.7 mGy
FINDINGS: Preprocedural KUB demonstrated a nonobstructive bowel gas pattern,
with no pneumoperitoneum.

Single contrast upper GI demonstrated grossly normal esophagus with
no evidence of esophageal mass, stricture or esophageal ring. No
hiatal hernia. No extravasation of contrast material from the
esophagus. Single contrast images of the stomach or grossly normal
in appearance. No extravasation of contrast material from the
stomach.
IMPRESSION: 1. Limited single contrast upper GI demonstrates no extravasation of
contrast material to suggest esophageal perforation.

## 2022-04-13 DIAGNOSIS — M25511 Pain in right shoulder: Secondary | ICD-10-CM | POA: Diagnosis not present

## 2022-04-20 ENCOUNTER — Encounter (HOSPITAL_BASED_OUTPATIENT_CLINIC_OR_DEPARTMENT_OTHER): Payer: Self-pay | Admitting: Obstetrics and Gynecology

## 2022-04-20 DIAGNOSIS — R2689 Other abnormalities of gait and mobility: Secondary | ICD-10-CM | POA: Diagnosis not present

## 2022-04-20 DIAGNOSIS — M25511 Pain in right shoulder: Secondary | ICD-10-CM | POA: Diagnosis not present

## 2022-04-20 NOTE — Progress Notes (Signed)
Spoke w/ via phone for pre-op interview--- patient Lab needs dos---- Istat, CBG , IB msg sent to Dr. Cletis Media for orders             Lab results------ BMP, H&H 02/11/22 in Epic, EKG 01/30/22 COVID test -----patient states asymptomatic no test needed Arrive at ------- 0600 on 05/04/22 NPO after MN NO Solid Food.  Clear liquids from MN until---0500 on 05/04/22 Med rec completed Medications to take morning of surgery ----- lipitor, prilosec Diabetic medication -----has mounjaro ordered, has not been able to obtain from pharmacy d/t insurance coverage; informed pt to withhold from starting medication until after surgery, pt verbalized understanding  Patient instructed no nail polish to be worn day of surgery Patient instructed to bring photo id and insurance card day of surgery Patient aware to have Driver (ride ) / caregiver    for 24 hours after surgery - Blaine (sister-in-law) [phone] Patient Special Instructions ----- bring CPAP with tubing DOS Pre-Op special Istructions ----- normal protocol for Dr. Cletis Media is order for ERAS drink preop, pt refused Patient verbalized understanding of instructions that were given at this phone interview. Patient denies shortness of breath, chest pain, fever, cough at this phone interview.  EKG 01/30/22 NSR with sinus arrhythmia, unchanged since last EKG. Pt has transportation DOS, unsure if caregiver available after surgery. Explained policy for adult caregiver present 24 hours s/p anesthesia, pt verbalized understanding. Pt to notify MD office and [facility] if unable to obtain caregiver s/p surgery prior to surgery date. Also informed pt policy states pt is not allowed to drive minimum of 24 hours s/p anesthesia.   Lyndel Pleasure, RN

## 2022-04-24 NOTE — H&P (Signed)
Jodi Keller is a 67 y.o. 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  In 2022, the patient had postmenopausal bleeding and was found to have complex endometrial hyperplasia without atypia. She underwent hysteroscopy, dilatation and curettage followed by placement of a Mirena IUD. In June 2023 a follow up endometrial biopsy returned: polypoid fragments of endometrium with pseudodecidualization of the stroma, with atrophic glands suggestive of exogenous hormone effect but no atypia or malignancy. A pelvic ultrasound in January 2024 revealed a uterine volume = 59 cc; 6.37 x 4.40 x 4.02 cm, endometrium-7.10 mm (thickened) with multiple calcifications; right ovary-2.53 cm and left ovary-2.05 cm.  The patient denies any vaginal bleeding, changes in bowel or bladder function, vaginitis symptoms or dyspareunia.  Given the findings on ultrasound and patient's history of complex hyperplasia she has consented to hysteroscopy  and replacement of her Mirena IUD for on-going endometrial protection.   Past Medical History  OB History: G: 1;  P: 2-0-0-2; C-section 1995  GYN History: menarche: 67YO;    LMP: menopausal;  Denies history of abnormal PAP smear.   Last PAP smear: 2021 normal  Medical History: Anemia, Cystocele, GERD, Herpes Simplex 2, Hyperlipidemia, Polycythemia Vera, Vertigo, Uterine Prolapse, Postmenopausal Bleeding, Complex Endometrial Hyperplasia without Atypia.   Surgical History: 1973: Right Foot Reconstruction; 1995 C-section; 2012: Hysteroscopy, Dilatation and Curettage; 2024: Right Shoulder Replacement  Denies problems with anesthesia or history of blood transfusions  Family History: Dementia, Prostate Cancer, Breast Cancer, Diabetes Mellitus, Multiple Myeloma, Parkinson's Disease  and Melanoma  Social History: Divorced; Retired;     Denies tobacco  or alcohol use   Medications:  Atorvastatin 10 mg daily Benazepril 20 mg daily Diclofenac 75 mg bid prn Hydrochlorothiazide 12. 5 mg daily Ondansetron 4 mg every 4 hours prn Mirena IUD placed 11/02/2020  No Known Allergies  Denies sensitivity to peanuts, shellfish, soy, latex or adhesives.   ROS: Denies headache, vision changes, nasal congestion, dysphagia, tinnitus, dizziness, hoarseness, cough,  chest pain, shortness of breath, nausea, vomiting, diarrhea,constipation,  urinary frequency, urgency  dysuria, hematuria, vaginitis symptoms, pelvic pain, swelling of joints,easy bruising,  myalgias, arthralgias, skin rashes, unexplained weight loss and except as is mentioned in the history of present illness, patient's review of systems is otherwise negative.    Physical Exam  Bp: 106/94;  Weight: 316 lbs.;  Height 5'6.5";  BMI: 50.8   Neck: supple without masses or thyromegaly Lungs: clear to auscultation Heart: regular rate and rhythm Abdomen: soft, non-tender and no organomegaly Pelvic:EGBUS- wnl; vagina-pelvic relaxation; uterus- (exam limited by body habitus) normal size, cervix without lesions or motion tenderness, IUD strings visible; adnexae-no tenderness or masses Extremities:  no clubbing, cyanosis or edema   Assesment: Thickened Endometrium                       History of Complex Endometrial Hyperplasia Without Atypia   Disposition:  A discussion was held with patient regarding the indication for her procedure(s) along with the risks, which include but are not limited to: reaction to anesthesia, damage to adjacent organs, infection and excessive bleeding. The patient verbalized understanding of these risks and has consented to proceed with Hysteroscopic Polypectomy, Dilatation, Curettage, Removal and Replacement of a Mirena IUD at [facility] on April 11.2024.    CSN# [id number]   Kyung Muto J. Lowell Guitar, PA-C  for Dr. Crist Fat. Rivard

## 2022-04-27 DIAGNOSIS — R2689 Other abnormalities of gait and mobility: Secondary | ICD-10-CM | POA: Diagnosis not present

## 2022-04-27 DIAGNOSIS — M25511 Pain in right shoulder: Secondary | ICD-10-CM | POA: Diagnosis not present

## 2022-05-03 DIAGNOSIS — M25511 Pain in right shoulder: Secondary | ICD-10-CM | POA: Diagnosis not present

## 2022-05-04 ENCOUNTER — Ambulatory Visit (HOSPITAL_BASED_OUTPATIENT_CLINIC_OR_DEPARTMENT_OTHER)
Admission: RE | Admit: 2022-05-04 | Discharge: 2022-05-04 | Disposition: A | Discharge status: Home / Self Care | Payer: Medicare Other | Attending: Obstetrics and Gynecology | Admitting: Obstetrics and Gynecology

## 2022-05-04 ENCOUNTER — Ambulatory Visit (HOSPITAL_BASED_OUTPATIENT_CLINIC_OR_DEPARTMENT_OTHER): Payer: Medicare Other | Admitting: Anesthesiology

## 2022-05-04 ENCOUNTER — Other Ambulatory Visit: Payer: Self-pay

## 2022-05-04 ENCOUNTER — Encounter (HOSPITAL_BASED_OUTPATIENT_CLINIC_OR_DEPARTMENT_OTHER): Payer: Self-pay | Admitting: Obstetrics and Gynecology

## 2022-05-04 ENCOUNTER — Encounter (HOSPITAL_BASED_OUTPATIENT_CLINIC_OR_DEPARTMENT_OTHER)
Admission: RE | Disposition: A | Discharge status: Home / Self Care | Payer: Self-pay | Source: Home / Self Care | Attending: Obstetrics and Gynecology

## 2022-05-04 DIAGNOSIS — G4733 Obstructive sleep apnea (adult) (pediatric): Secondary | ICD-10-CM | POA: Diagnosis not present

## 2022-05-04 DIAGNOSIS — N8501 Benign endometrial hyperplasia: Secondary | ICD-10-CM

## 2022-05-04 DIAGNOSIS — E119 Type 2 diabetes mellitus without complications: Secondary | ICD-10-CM | POA: Insufficient documentation

## 2022-05-04 DIAGNOSIS — K219 Gastro-esophageal reflux disease without esophagitis: Secondary | ICD-10-CM | POA: Insufficient documentation

## 2022-05-04 DIAGNOSIS — N950 Postmenopausal bleeding: Secondary | ICD-10-CM

## 2022-05-04 DIAGNOSIS — Z833 Family history of diabetes mellitus: Secondary | ICD-10-CM | POA: Insufficient documentation

## 2022-05-04 DIAGNOSIS — G4730 Sleep apnea, unspecified: Secondary | ICD-10-CM | POA: Diagnosis not present

## 2022-05-04 DIAGNOSIS — N858 Other specified noninflammatory disorders of uterus: Secondary | ICD-10-CM | POA: Diagnosis not present

## 2022-05-04 DIAGNOSIS — Z6841 Body Mass Index (BMI) 40.0 and over, adult: Secondary | ICD-10-CM | POA: Diagnosis not present

## 2022-05-04 DIAGNOSIS — I10 Essential (primary) hypertension: Secondary | ICD-10-CM

## 2022-05-04 DIAGNOSIS — N840 Polyp of corpus uteri: Secondary | ICD-10-CM | POA: Insufficient documentation

## 2022-05-04 DIAGNOSIS — R9389 Abnormal findings on diagnostic imaging of other specified body structures: Secondary | ICD-10-CM | POA: Insufficient documentation

## 2022-05-04 DIAGNOSIS — N841 Polyp of cervix uteri: Secondary | ICD-10-CM | POA: Diagnosis not present

## 2022-05-04 DIAGNOSIS — Z9989 Dependence on other enabling machines and devices: Secondary | ICD-10-CM

## 2022-05-04 DIAGNOSIS — Z8742 Personal history of other diseases of the female genital tract: Secondary | ICD-10-CM | POA: Diagnosis not present

## 2022-05-04 HISTORY — PX: HYSTEROSCOPY WITH D & C: SHX1775

## 2022-05-04 HISTORY — PX: POLYPECTOMY: SHX5525

## 2022-05-04 HISTORY — PX: INTRAUTERINE DEVICE (IUD) INSERTION: SHX5877

## 2022-05-04 HISTORY — PX: IUD REMOVAL: SHX5392

## 2022-05-04 LAB — BASIC METABOLIC PANEL
Anion gap: 7 (ref 5–15)
BUN: 22 mg/dL (ref 8–23)
CO2: 23 mmol/L (ref 22–32)
Calcium: 8.9 mg/dL (ref 8.9–10.3)
Chloride: 108 mmol/L (ref 98–111)
Creatinine, Ser: 1.05 mg/dL — ABNORMAL HIGH (ref 0.44–1.00)
GFR, Estimated: 59 mL/min — ABNORMAL LOW (ref >60)
Glucose, Bld: 101 mg/dL — ABNORMAL HIGH (ref 70–99)
Potassium: 3.4 mmol/L — ABNORMAL LOW (ref 3.5–5.1)
Sodium: 138 mmol/L (ref 135–145)

## 2022-05-04 LAB — CBC
HCT: 44.6 % (ref 36.0–46.0)
Hemoglobin: 14.4 g/dL (ref 12.0–15.0)
MCH: 30.1 pg (ref 26.0–34.0)
MCHC: 32.3 g/dL (ref 30.0–36.0)
MCV: 93.3 fL (ref 80.0–100.0)
Platelets: 202 10*3/uL (ref 150–400)
RBC: 4.78 MIL/uL (ref 3.87–5.11)
RDW: 14.5 % (ref 11.5–15.5)
WBC: 4.2 10*3/uL (ref 4.0–10.5)
nRBC: 0 % (ref 0.0–0.2)

## 2022-05-04 LAB — TYPE AND SCREEN
ABO/RH(D): O POS
Antibody Screen: NEGATIVE

## 2022-05-04 LAB — GLUCOSE, CAPILLARY: Glucose-Capillary: 97 mg/dL (ref 70–99)

## 2022-05-04 LAB — ABO/RH: ABO/RH(D): O POS

## 2022-05-04 SURGERY — DILATATION AND CURETTAGE /HYSTEROSCOPY
Anesthesia: General | Site: Vagina

## 2022-05-04 MED ORDER — ENSURE PRE-SURGERY PO LIQD: 296.0000 mL | Freq: Once | ORAL | Status: DC

## 2022-05-04 MED ORDER — PROPOFOL 10 MG/ML IV BOLUS
INTRAVENOUS | Status: DC | PRN
  Administered 2022-05-04: 200 mg via INTRAVENOUS

## 2022-05-04 MED ORDER — CELECOXIB 200 MG PO CAPS
ORAL_CAPSULE | ORAL | Status: AC
  Filled 2022-05-04: qty 2

## 2022-05-04 MED ORDER — CHLOROPROCAINE HCL 1 % IJ SOLN
INTRAMUSCULAR | Status: DC | PRN
  Administered 2022-05-04: 10 mL

## 2022-05-04 MED ORDER — MIDAZOLAM HCL 2 MG/2ML IJ SOLN
INTRAMUSCULAR | Status: AC
  Filled 2022-05-04: qty 2

## 2022-05-04 MED ORDER — ACETAMINOPHEN 500 MG PO TABS
ORAL_TABLET | ORAL | Status: AC
  Filled 2022-05-04: qty 2

## 2022-05-04 MED ORDER — PROPOFOL 10 MG/ML IV BOLUS
INTRAVENOUS | Status: AC
  Filled 2022-05-04: qty 20

## 2022-05-04 MED ORDER — LACTATED RINGERS IV SOLN: INTRAVENOUS | Status: DC

## 2022-05-04 MED ORDER — OXYCODONE HCL 5 MG PO TABS: 5.0000 mg | ORAL_TABLET | Freq: Once | ORAL | Status: DC | PRN

## 2022-05-04 MED ORDER — LEVONORGESTREL 20 MCG/DAY IU IUD
INTRAUTERINE_SYSTEM | INTRAUTERINE | Status: AC
  Filled 2022-05-04: qty 1

## 2022-05-04 MED ORDER — LIDOCAINE HCL (PF) 2 % IJ SOLN
INTRAMUSCULAR | Status: AC
  Filled 2022-05-04: qty 5

## 2022-05-04 MED ORDER — HYDROMORPHONE HCL 1 MG/ML IJ SOLN
INTRAMUSCULAR | Status: AC
  Filled 2022-05-04: qty 1

## 2022-05-04 MED ORDER — LIDOCAINE HCL (CARDIAC) PF 100 MG/5ML IV SOSY
PREFILLED_SYRINGE | INTRAVENOUS | Status: DC | PRN
  Administered 2022-05-04: 60 mg via INTRAVENOUS

## 2022-05-04 MED ORDER — ACETAMINOPHEN 500 MG PO TABS
1000.0000 mg | ORAL_TABLET | ORAL | Status: AC
  Administered 2022-05-04: 1000 mg via ORAL

## 2022-05-04 MED ORDER — CELECOXIB 200 MG PO CAPS
400.0000 mg | ORAL_CAPSULE | ORAL | Status: AC
  Administered 2022-05-04: 400 mg via ORAL

## 2022-05-04 MED ORDER — OXYCODONE HCL 5 MG/5ML PO SOLN: 5.0000 mg | Freq: Once | ORAL | Status: DC | PRN

## 2022-05-04 MED ORDER — GABAPENTIN 300 MG PO CAPS
300.0000 mg | ORAL_CAPSULE | ORAL | Status: AC
  Administered 2022-05-04: 300 mg via ORAL

## 2022-05-04 MED ORDER — FENTANYL CITRATE (PF) 100 MCG/2ML IJ SOLN
INTRAMUSCULAR | Status: DC | PRN
  Administered 2022-05-04 (×2): 25 ug via INTRAVENOUS

## 2022-05-04 MED ORDER — HYDROMORPHONE HCL 1 MG/ML IJ SOLN
0.2500 mg | INTRAMUSCULAR | Status: DC | PRN
  Administered 2022-05-04: 0.25 mg via INTRAVENOUS

## 2022-05-04 MED ORDER — PROMETHAZINE HCL 25 MG/ML IJ SOLN: 6.2500 mg | INTRAMUSCULAR | Status: DC | PRN

## 2022-05-04 MED ORDER — GABAPENTIN 300 MG PO CAPS
ORAL_CAPSULE | ORAL | Status: AC
  Filled 2022-05-04: qty 1

## 2022-05-04 MED ORDER — ONDANSETRON HCL 4 MG/2ML IJ SOLN
INTRAMUSCULAR | Status: AC
  Filled 2022-05-04: qty 2

## 2022-05-04 MED ORDER — AMISULPRIDE (ANTIEMETIC) 5 MG/2ML IV SOLN: 10.0000 mg | Freq: Once | INTRAVENOUS | Status: DC | PRN

## 2022-05-04 MED ORDER — MIDAZOLAM HCL 5 MG/5ML IJ SOLN
INTRAMUSCULAR | Status: DC | PRN
  Administered 2022-05-04: 2 mg via INTRAVENOUS

## 2022-05-04 MED ORDER — FENTANYL CITRATE (PF) 100 MCG/2ML IJ SOLN
INTRAMUSCULAR | Status: AC
  Filled 2022-05-04: qty 2

## 2022-05-04 MED ORDER — DEXAMETHASONE SODIUM PHOSPHATE 4 MG/ML IJ SOLN
INTRAMUSCULAR | Status: DC | PRN
  Administered 2022-05-04: 5 mg via INTRAVENOUS

## 2022-05-04 MED ORDER — SODIUM CHLORIDE 0.9 % IR SOLN
Status: DC | PRN
  Administered 2022-05-04: 1500 mL

## 2022-05-04 MED ORDER — POVIDONE-IODINE 10 % EX SWAB: 2.0000 | Freq: Once | CUTANEOUS | Status: DC

## 2022-05-04 MED ORDER — LEVONORGESTREL 20 MCG/DAY IU IUD
1.0000 | INTRAUTERINE_SYSTEM | INTRAUTERINE | Status: AC
  Administered 2022-05-04: 1 via INTRAUTERINE

## 2022-05-04 MED ORDER — ONDANSETRON HCL 4 MG/2ML IJ SOLN
INTRAMUSCULAR | Status: DC | PRN
  Administered 2022-05-04: 4 mg via INTRAVENOUS

## 2022-05-04 SURGICAL SUPPLY — 17 items
CATH ROBINSON RED A/P 16FR (CATHETERS) ×4 IMPLANT
DILATOR CANAL MILEX (MISCELLANEOUS) IMPLANT
GAUZE 4X4 16PLY ~~LOC~~+RFID DBL (SPONGE) IMPLANT
GLOVE BIOGEL PI IND STRL 7.0 (GLOVE) ×4 IMPLANT
GLOVE ECLIPSE 6.5 STRL STRAW (GLOVE) ×4 IMPLANT
GOWN STRL REUS W/TWL LRG LVL3 (GOWN DISPOSABLE) ×4 IMPLANT
IV NS IRRIG 3000ML ARTHROMATIC (IV SOLUTION) ×4 IMPLANT
KIT PROCEDURE FLUENT (KITS) ×4 IMPLANT
KIT TURNOVER CYSTO (KITS) ×4 IMPLANT
Mirena Levonoregestrel-releasing intrauterine syst IMPLANT
PACK VAGINAL MINOR WOMEN LF (CUSTOM PROCEDURE TRAY) ×4 IMPLANT
PAD OB MATERNITY 4.3X12.25 (PERSONAL CARE ITEMS) ×4 IMPLANT
PAD PREP 24X48 CUFFED NSTRL (MISCELLANEOUS) ×4 IMPLANT
SEAL ROD LENS SCOPE MYOSURE (ABLATOR) ×4 IMPLANT
SLEEVE SCD COMPRESS KNEE MED (STOCKING) ×4 IMPLANT
TOWEL OR 17X24 6PK STRL BLUE (TOWEL DISPOSABLE) ×8 IMPLANT
WATER STERILE IRR 500ML POUR (IV SOLUTION) ×4 IMPLANT

## 2022-05-04 NOTE — Anesthesia Preprocedure Evaluation (Signed)
Anesthesia Evaluation  Patient identified by MRN, date of birth, ID band Patient awake    Reviewed: Allergy & Precautions, NPO status , Patient's Chart, lab work & pertinent test results  Airway Mallampati: III  TM Distance: >3 FB Neck ROM: Full    Dental no notable dental hx. (+) Teeth Intact, Dental Advisory Given   Pulmonary sleep apnea and Continuous Positive Airway Pressure Ventilation    Pulmonary exam normal breath sounds clear to auscultation       Cardiovascular hypertension, Pt. on medications Normal cardiovascular exam Rhythm:Regular Rate:Normal     Neuro/Psych  PSYCHIATRIC DISORDERS Anxiety     negative neurological ROS     GI/Hepatic Neg liver ROS, hiatal hernia,GERD  ,,  Endo/Other  diabetes, Well Controlled  Morbid obesity (BMI 49)  Renal/GU Renal InsufficiencyRenal disease  negative genitourinary   Musculoskeletal  (+) Arthritis , Osteoarthritis,    Abdominal  (+) + obese  Peds  Hematology negative hematology ROS (+)   Anesthesia Other Findings   Reproductive/Obstetrics                             Anesthesia Physical Anesthesia Plan  ASA: 3  Anesthesia Plan: General   Post-op Pain Management: Tylenol PO (pre-op)*, Celebrex PO (pre-op)* and Gabapentin PO (pre-op)*   Induction: Intravenous  PONV Risk Score and Plan: 3 and Midazolam, Dexamethasone, Ondansetron and Treatment may vary due to age or medical condition  Airway Management Planned: LMA  Additional Equipment:   Intra-op Plan:   Post-operative Plan: Extubation in OR  Informed Consent: I have reviewed the patients History and Physical, chart, labs and discussed the procedure including the risks, benefits and alternatives for the proposed anesthesia with the patient or authorized representative who has indicated his/her understanding and acceptance.     Dental advisory given  Plan Discussed with:  CRNA  Anesthesia Plan Comments:        Anesthesia Quick Evaluation

## 2022-05-04 NOTE — Discharge Instructions (Addendum)
POST-OPERATIVE INSTRUCTIONS TO PATIENT  Call [facility] at [phone]  for excessive pain, bleeding or temperature greater than or equal to 100.4 degrees (orally).    No driving for 24 hours  Pain management: use current prescriptions to manage pain as needed  Use Colace 1-2 capsules per day as long as you are using pain medication to avoid constipation.       Diet: normal  Bathing: may shower day after surgery  Return to Dr. Estanislado Pandy on 05/17/22 at 9:00 am   Silverio Lay MD         No acetaminophen/Tylenol until after 12:45 pm today if needed.  No ibuprofen, Advil, Aleve, Motrin, ketorolac, meloxicam, naproxen, or other NSAIDS until after 12:45 pm today if needed.  Post Anesthesia Home Care Instructions  Activity: Get plenty of rest for the remainder of the day. A responsible individual must stay with you for 24 hours following the procedure.  For the next 24 hours, DO NOT: -Drive a car -Advertising copywriter -Drink alcoholic beverages -Take any medication unless instructed by your physician -Make any legal decisions or sign important papers.  Meals: Start with liquid foods such as gelatin or soup. Progress to regular foods as tolerated. Avoid greasy, spicy, heavy foods. If nausea and/or vomiting occur, drink only clear liquids until the nausea and/or vomiting subsides. Call your physician if vomiting continues.  Special Instructions/Symptoms: Your throat may feel dry or sore from the anesthesia or the breathing tube placed in your throat during surgery. If this causes discomfort, gargle with warm salt water. The discomfort should disappear within 24 hours.

## 2022-05-04 NOTE — Op Note (Signed)
Preop diagnosis: Thickened endometrium, previous history of Complex endometrial Hyperplasia, morbid obesity  Postop diagnosis: same with 2 endometrial polyps  Anesthesia: IV sedation with LMA  Anesthesiologist: Dr. Hyacinth Meeker  Procedure: Hysteroscopy, polypectomy, D&C, replacement of Mirena IUD  Surgeon: Dr. Dois Davenport Aneesh Faller  Procedure: After being informed of the planned procedure with possible complications including bleeding, infection and uterine perforation, informed consent was obtained and patient was taken to or #5.  She was given IV sedation  anesthesia without complication. She was placed in a dorsal decubitus position, prepped and draped in the sterile fashion and a red rubber catheter was used to empty her bladder. Pelvic exam reveals retroverted uterus, normal size, 2 normal adnexa..  A speculum is inserted in the vagina. The cervix was grasped with a tenaculum forcep placed on the anterior lip.We proceed with a paracervical block using 1% Nesacaine, 10 cc. Uterus is sounded at 10. The cervix is then easily dilated using Hegar dilator until # 23. This allows for easy placement of an operative hysteroscope. With perfusion of NS at a maximum pressure of 80 mmHg, we are able to evaluate the entire uterine cavity.  Observation: IUD and strings in the fundus, 2 normal ostia, 2 endometrial polyps on the posterior wall (0.3 and 1 cm), overall atrophic appearing endometrium.  We proceed with polypectomy x 2  using biopsy forceps only. The Mirena IUD is removed .Using a sharp curette, we proceed with curettage of the endometrial cavity which returns a small amount of normal-appearing endometrium. A new Mirena IUD is inserted per protocol.  Instruments are then removed. Instrument and sponge count is complete x2. Estimated blood loss is minimal. Water deficit is 205 cc of NS.  The procedure is very well tolerated by the patient who is taken to recovery room in a well and stable  condition.  Specimen: Endometrial polyps and endometrial curettings sent to pathology.

## 2022-05-04 NOTE — Transfer of Care (Signed)
Immediate Anesthesia Transfer of Care Note  Patient: Jodi Keller  Procedure(s) Performed: DILATATION AND CURETTAGE /HYSTEROSCOPY (Vagina ) POLYPECTOMY INTRAUTERINE DEVICE (IUD) REMOVAL (Uterus) INTRAUTERINE DEVICE (IUD) INSERTION (Uterus)  Patient Location: PACU  Anesthesia Type:General  Level of Consciousness: awake, alert , and oriented  Airway & Oxygen Therapy: Patient Spontanous Breathing  Post-op Assessment: Report given to RN and Post -op Vital signs reviewed and stable  Post vital signs: Reviewed and stable  Last Vitals:  Vitals Value Taken Time  BP 127/72 05/04/22 0851  Temp    Pulse 59 05/04/22 0852  Resp 13 05/04/22 0852  SpO2 99 % 05/04/22 0852  Vitals shown include unvalidated device data.  Last Pain:  Vitals:   05/04/22 0645  TempSrc: Oral  PainSc: 0-No pain      Patients Stated Pain Goal: 3 (05/04/22 0645)  Complications: No notable events documented.

## 2022-05-04 NOTE — Interval H&P Note (Signed)
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  The various methods of treatment have been discussed with the patient and family. After consideration of risks, benefits and other options for treatment, the patient has consented to  Procedure(s): DILATATION AND CURETTAGE /HYSTEROSCOPY (N/A) POLYPECTOMY (N/A) INTRAUTERINE DEVICE (IUD) REMOVAL (N/A) INTRAUTERINE DEVICE (IUD) INSERTION (N/A) as a surgical intervention.  The patient's history has been reviewed, patient examined, no change in status, stable for surgery.  I have reviewed the patient's chart and labs.  Questions were answered to the patient's satisfaction.     Dois Davenport A Lasaro Primm

## 2022-05-04 NOTE — Anesthesia Postprocedure Evaluation (Signed)
Anesthesia Post Note  Patient: Jodi Keller  Procedure(s) Performed: DILATATION AND CURETTAGE /HYSTEROSCOPY (Vagina ) POLYPECTOMY INTRAUTERINE DEVICE (IUD) REMOVAL (Uterus) INTRAUTERINE DEVICE (IUD) INSERTION (Uterus)     Patient location during evaluation: PACU Anesthesia Type: General Level of consciousness: awake and alert Pain management: pain level controlled Vital Signs Assessment: post-procedure vital signs reviewed and stable Respiratory status: spontaneous breathing, nonlabored ventilation and respiratory function stable Cardiovascular status: blood pressure returned to baseline and stable Postop Assessment: no apparent nausea or vomiting Anesthetic complications: no   No notable events documented.  Last Vitals:  Vitals:   05/04/22 1000 05/04/22 1015  BP: (!) 109/92 120/65  Pulse: (!) 45 (!) 53  Resp: 15 17  Temp:    SpO2: 91% 94%    Last Pain:  Vitals:   05/04/22 0930  TempSrc:   PainSc: 3                  Lowella Curb

## 2022-05-05 ENCOUNTER — Encounter (HOSPITAL_BASED_OUTPATIENT_CLINIC_OR_DEPARTMENT_OTHER): Payer: Self-pay | Admitting: Obstetrics and Gynecology

## 2022-05-08 ENCOUNTER — Encounter: Payer: Self-pay | Admitting: Internal Medicine

## 2022-05-08 LAB — SURGICAL PATHOLOGY

## 2022-05-10 ENCOUNTER — Other Ambulatory Visit (HOSPITAL_COMMUNITY): Payer: Self-pay

## 2022-05-11 ENCOUNTER — Telehealth: Payer: Self-pay

## 2022-05-11 DIAGNOSIS — R2689 Other abnormalities of gait and mobility: Secondary | ICD-10-CM | POA: Diagnosis not present

## 2022-05-11 DIAGNOSIS — M25511 Pain in right shoulder: Secondary | ICD-10-CM | POA: Diagnosis not present

## 2022-05-11 NOTE — Telephone Encounter (Signed)
Pharmacy Patient Advocate Encounter   Received notification from [facility] that prior authorization for [facility] 2.5MG /0.5ML pen-injectors is required/requested.  Per Test Claim: PA required   PA submitted on 05/11/22 to (ins) [employer] via CoverMyMeds Key or (Medicaid) confirmation # [id number] Status is pending

## 2022-05-13 NOTE — Telephone Encounter (Signed)
PA Denied- Full letter in Media   

## 2022-05-15 NOTE — Telephone Encounter (Signed)
Send my chart message to pt about denial of Mounjaro 2.5MG /0.5ML

## 2022-05-16 DIAGNOSIS — M79641 Pain in right hand: Secondary | ICD-10-CM | POA: Diagnosis not present

## 2022-05-16 DIAGNOSIS — Z4789 Encounter for other orthopedic aftercare: Secondary | ICD-10-CM | POA: Diagnosis not present

## 2022-05-17 DIAGNOSIS — Z8742 Personal history of other diseases of the female genital tract: Secondary | ICD-10-CM | POA: Diagnosis not present

## 2022-05-17 DIAGNOSIS — N840 Polyp of corpus uteri: Secondary | ICD-10-CM | POA: Diagnosis not present

## 2022-05-17 DIAGNOSIS — R9389 Abnormal findings on diagnostic imaging of other specified body structures: Secondary | ICD-10-CM | POA: Diagnosis not present

## 2022-05-19 DIAGNOSIS — M25511 Pain in right shoulder: Secondary | ICD-10-CM | POA: Diagnosis not present

## 2022-05-19 DIAGNOSIS — R2689 Other abnormalities of gait and mobility: Secondary | ICD-10-CM | POA: Diagnosis not present

## 2022-05-27 ENCOUNTER — Other Ambulatory Visit: Payer: Self-pay

## 2022-05-27 ENCOUNTER — Emergency Department (HOSPITAL_BASED_OUTPATIENT_CLINIC_OR_DEPARTMENT_OTHER)
Admission: EM | Admit: 2022-05-27 | Discharge: 2022-05-27 | Disposition: A | Discharge status: Home / Self Care | Payer: Medicare Other | Attending: Emergency Medicine | Admitting: Emergency Medicine

## 2022-05-27 ENCOUNTER — Emergency Department (HOSPITAL_BASED_OUTPATIENT_CLINIC_OR_DEPARTMENT_OTHER): Payer: Medicare Other | Admitting: Radiology

## 2022-05-27 ENCOUNTER — Encounter (HOSPITAL_BASED_OUTPATIENT_CLINIC_OR_DEPARTMENT_OTHER): Payer: Self-pay | Admitting: Emergency Medicine

## 2022-05-27 ENCOUNTER — Emergency Department (HOSPITAL_BASED_OUTPATIENT_CLINIC_OR_DEPARTMENT_OTHER): Payer: Medicare Other

## 2022-05-27 DIAGNOSIS — S4981XA Other specified injuries of right shoulder and upper arm, initial encounter: Secondary | ICD-10-CM | POA: Diagnosis not present

## 2022-05-27 DIAGNOSIS — M25511 Pain in right shoulder: Secondary | ICD-10-CM | POA: Diagnosis not present

## 2022-05-27 MED ORDER — OXYCODONE-ACETAMINOPHEN 5-325 MG PO TABS
1.0000 | ORAL_TABLET | ORAL | Status: AC | PRN
  Administered 2022-05-27 (×2): 1 via ORAL
  Filled 2022-05-27 (×2): qty 1

## 2022-05-27 NOTE — ED Provider Notes (Signed)
[place] EMERGENCY DEPARTMENT AT [facility] Provider Note   CSN: [id number] Arrival date & time: 05/27/22  1932     History Chief Complaint  Patient presents with   Shoulder Pain    HPI Jodi Keller is a 67 y.o. female presenting for chief complaint of right shoulder pain.  States that she was reaching up over her head when she felt a painful sensation in her right shoulder.  Denies fevers chills nausea vomiting syncope shortness of breath.  Otherwise ambulatory tolerating p.o. intake.  No known sick contacts.   Patient's recorded medical, surgical, social, medication list and allergies were reviewed in the Snapshot window as part of the initial history.   Review of Systems   Review of Systems  Constitutional:  Negative for chills and fever.  HENT:  Negative for ear pain and sore throat.   Eyes:  Negative for pain and visual disturbance.  Respiratory:  Negative for cough and shortness of breath.   Cardiovascular:  Negative for chest pain and palpitations.  Gastrointestinal:  Negative for abdominal pain and vomiting.  Genitourinary:  Negative for dysuria and hematuria.  Musculoskeletal:  Negative for arthralgias and back pain.  Skin:  Negative for color change and rash.  Neurological:  Negative for seizures and syncope.  All other systems reviewed and are negative.   Physical Exam Updated Vital Signs BP (!) 134/98   Pulse (!) 58   Temp 98.2 F (36.8 C) (Oral)   Resp 20   LMP 01/12/2011   SpO2 100%  Physical Exam Vitals and nursing note reviewed.  Constitutional:      General: She is not in acute distress.    Appearance: She is well-developed.  HENT:     Head: Normocephalic and atraumatic.  Eyes:     Conjunctiva/sclera: Conjunctivae normal.  Cardiovascular:     Rate and Rhythm: Normal rate and regular rhythm.     Heart sounds: No murmur heard. Pulmonary:     Effort: Pulmonary effort is normal. No respiratory distress.     Breath sounds: Normal  breath sounds.  Abdominal:     General: There is no distension.     Palpations: Abdomen is soft.     Tenderness: There is no abdominal tenderness. There is no right CVA tenderness or left CVA tenderness.  Musculoskeletal:        General: Tenderness (Pain in the right upper extremity significant range of motion limitation.) present. No swelling. Normal range of motion.     Cervical back: Neck supple.  Skin:    General: Skin is warm and dry.  Neurological:     General: No focal deficit present.     Mental Status: She is alert and oriented to person, place, and time. Mental status is at baseline.     Cranial Nerves: No cranial nerve deficit.      ED Course/ Medical Decision Making/ A&P    Procedures Procedures   Medications Ordered in ED Medications  oxyCODONE-acetaminophen (PERCOCET/ROXICET) 5-325 MG per tablet 1 tablet (1 tablet Oral Given 05/27/22 2204)    Medical Decision Making:   Patient is presenting with right shoulder pain.  Differential is broad including fracture dislocation, infection, ligamentous strain, or other orthopedic injury.  X-ray performed with no focal pathology detected.  With patient was consulted recommended CT.  CT performed with no focal pathology.  On-call orthopedic agreed with plan for place in sling and outpatient follow-up for outpatient care and management. Patient has pain medication at home. Disposition:  I have considered need for hospitalization, however, considering all of the above, I believe this patient is stable for discharge at this time.  Patient/family educated about specific return precautions for given chief complaint and symptoms.  Patient/family educated about follow-up with PCP .    Patient/family expressed understanding of return precautions and need for follow-up. Patient spoken to regarding all imaging and laboratory results and appropriate follow up for these results. All education provided in verbal form with additional  information in written form. Time was allowed for answering of patient questions. Patient discharged.    Emergency Department Medication Summary:   Medications  oxyCODONE-acetaminophen (PERCOCET/ROXICET) 5-325 MG per tablet 1 tablet (1 tablet Oral Given 05/27/22 2204)        Clinical Impression:  1. Acute pain of right shoulder      Discharge   Final Clinical Impression(s) / ED Diagnoses Final diagnoses:  Acute pain of right shoulder    Rx / DC Orders ED Discharge Orders     None         Glyn Ade, MD 05/27/22 2259

## 2022-05-27 NOTE — ED Triage Notes (Signed)
Right shoulder pain.  Reports reaching high then having pain and mobility issues. Has "device " in shoulder and believes it is out of place. Place in jan 2024 with Dr Princella Ion uncomfortable in triage

## 2022-05-31 MED ORDER — METFORMIN HCL ER 500 MG PO TB24: 1000.0000 mg | ORAL_TABLET | Freq: Every day | ORAL | 3 refills | Status: DC

## 2022-05-31 NOTE — Telephone Encounter (Signed)
Insurance will not cover [employer].  [employer] gave alternative Metformin 500mg , Victoza 18 mg or Byetta 5 mcg. Pls advise .../;mb

## 2022-05-31 NOTE — Telephone Encounter (Signed)
Ok metformin done erx

## 2022-05-31 NOTE — Addendum Note (Signed)
Addended by: Corwin Levins on: 05/31/2022 03:04 PM   Modules accepted: Orders

## 2022-06-13 ENCOUNTER — Ambulatory Visit
Admission: RE | Admit: 2022-06-13 | Discharge: 2022-06-13 | Disposition: A | Discharge status: Home / Self Care | Payer: Medicare Other | Source: Ambulatory Visit | Attending: Obstetrics and Gynecology | Admitting: Obstetrics and Gynecology

## 2022-06-13 DIAGNOSIS — N958 Other specified menopausal and perimenopausal disorders: Secondary | ICD-10-CM | POA: Diagnosis not present

## 2022-06-13 DIAGNOSIS — M810 Age-related osteoporosis without current pathological fracture: Secondary | ICD-10-CM | POA: Diagnosis not present

## 2022-06-13 DIAGNOSIS — Z13820 Encounter for screening for osteoporosis: Secondary | ICD-10-CM

## 2022-06-13 DIAGNOSIS — E2839 Other primary ovarian failure: Secondary | ICD-10-CM | POA: Diagnosis not present

## 2022-06-27 ENCOUNTER — Other Ambulatory Visit: Payer: Self-pay | Admitting: Internal Medicine

## 2022-06-27 MED ORDER — PIOGLITAZONE HCL 30 MG PO TABS: 30.0000 mg | ORAL_TABLET | Freq: Every day | ORAL | 3 refills | Status: DC

## 2022-06-27 NOTE — Addendum Note (Signed)
Addended by: Corwin Levins on: 06/27/2022 01:04 PM   Modules accepted: Orders

## 2022-07-12 DIAGNOSIS — Z6841 Body Mass Index (BMI) 40.0 and over, adult: Secondary | ICD-10-CM | POA: Diagnosis not present

## 2022-07-12 DIAGNOSIS — G4733 Obstructive sleep apnea (adult) (pediatric): Secondary | ICD-10-CM | POA: Diagnosis not present

## 2022-08-23 ENCOUNTER — Encounter (INDEPENDENT_AMBULATORY_CARE_PROVIDER_SITE_OTHER): Payer: Self-pay

## 2022-08-24 DIAGNOSIS — E119 Type 2 diabetes mellitus without complications: Secondary | ICD-10-CM | POA: Diagnosis not present

## 2022-09-19 ENCOUNTER — Ambulatory Visit: Payer: Medicare Other | Admitting: Internal Medicine

## 2022-09-21 ENCOUNTER — Ambulatory Visit (INDEPENDENT_AMBULATORY_CARE_PROVIDER_SITE_OTHER): Payer: Medicare Other | Admitting: Internal Medicine

## 2022-09-21 ENCOUNTER — Encounter: Payer: Self-pay | Admitting: Internal Medicine

## 2022-09-21 VITALS — BP 122/78 | HR 66 | Temp 98.6°F | Ht 67.0 in | Wt 325.0 lb

## 2022-09-21 DIAGNOSIS — Z7985 Long-term (current) use of injectable non-insulin antidiabetic drugs: Secondary | ICD-10-CM

## 2022-09-21 DIAGNOSIS — N1831 Chronic kidney disease, stage 3a: Secondary | ICD-10-CM

## 2022-09-21 DIAGNOSIS — R202 Paresthesia of skin: Secondary | ICD-10-CM | POA: Diagnosis not present

## 2022-09-21 DIAGNOSIS — E559 Vitamin D deficiency, unspecified: Secondary | ICD-10-CM | POA: Diagnosis not present

## 2022-09-21 DIAGNOSIS — E7800 Pure hypercholesterolemia, unspecified: Secondary | ICD-10-CM | POA: Diagnosis not present

## 2022-09-21 DIAGNOSIS — E1165 Type 2 diabetes mellitus with hyperglycemia: Secondary | ICD-10-CM | POA: Diagnosis not present

## 2022-09-21 DIAGNOSIS — F418 Other specified anxiety disorders: Secondary | ICD-10-CM | POA: Diagnosis not present

## 2022-09-21 DIAGNOSIS — E538 Deficiency of other specified B group vitamins: Secondary | ICD-10-CM | POA: Diagnosis not present

## 2022-09-21 DIAGNOSIS — I10 Essential (primary) hypertension: Secondary | ICD-10-CM

## 2022-09-21 DIAGNOSIS — Z0001 Encounter for general adult medical examination with abnormal findings: Secondary | ICD-10-CM | POA: Diagnosis not present

## 2022-09-21 LAB — URINALYSIS, ROUTINE W REFLEX MICROSCOPIC
Bilirubin Urine: NEGATIVE
Hgb urine dipstick: NEGATIVE
Ketones, ur: NEGATIVE
Nitrite: NEGATIVE
Specific Gravity, Urine: >=1.03 — AB (ref 1.000–1.030)
Total Protein, Urine: NEGATIVE
Urine Glucose: NEGATIVE
Urobilinogen, UA: 0.2 (ref 0.0–1.0)
pH: 6 (ref 5.0–8.0)

## 2022-09-21 LAB — HEPATIC FUNCTION PANEL
ALT: 14 U/L (ref 0–35)
AST: 18 U/L (ref 0–37)
Albumin: 3.6 g/dL (ref 3.5–5.2)
Alkaline Phosphatase: 75 U/L (ref 39–117)
Bilirubin, Direct: 0.2 mg/dL (ref 0.0–0.3)
Total Bilirubin: 0.8 mg/dL (ref 0.2–1.2)
Total Protein: 6.9 g/dL (ref 6.0–8.3)

## 2022-09-21 LAB — CBC WITH DIFFERENTIAL/PLATELET
Basophils Absolute: 0 10*3/uL (ref 0.0–0.1)
Basophils Relative: 0.7 % (ref 0.0–3.0)
Eosinophils Absolute: 0.2 10*3/uL (ref 0.0–0.7)
Eosinophils Relative: 3.2 % (ref 0.0–5.0)
HCT: 44.2 % (ref 36.0–46.0)
Hemoglobin: 14.5 g/dL (ref 12.0–15.0)
Lymphocytes Relative: 22.7 % (ref 12.0–46.0)
Lymphs Abs: 1.4 10*3/uL (ref 0.7–4.0)
MCHC: 32.8 g/dL (ref 30.0–36.0)
MCV: 94.9 fl (ref 78.0–100.0)
Monocytes Absolute: 0.5 10*3/uL (ref 0.1–1.0)
Monocytes Relative: 8.6 % (ref 3.0–12.0)
Neutro Abs: 3.9 10*3/uL (ref 1.4–7.7)
Neutrophils Relative %: 64.8 % (ref 43.0–77.0)
Platelets: 259 10*3/uL (ref 150.0–400.0)
RBC: 4.66 Mil/uL (ref 3.87–5.11)
RDW: 14.6 % (ref 11.5–15.5)
WBC: 6.1 10*3/uL (ref 4.0–10.5)

## 2022-09-21 LAB — BASIC METABOLIC PANEL
BUN: 24 mg/dL — ABNORMAL HIGH (ref 6–23)
CO2: 26 mEq/L (ref 19–32)
Calcium: 9.6 mg/dL (ref 8.4–10.5)
Chloride: 107 mEq/L (ref 96–112)
Creatinine, Ser: 1.03 mg/dL (ref 0.40–1.20)
GFR: 56.38 mL/min — ABNORMAL LOW (ref >60.00)
Glucose, Bld: 90 mg/dL (ref 70–99)
Potassium: 3.8 mEq/L (ref 3.5–5.1)
Sodium: 139 mEq/L (ref 135–145)

## 2022-09-21 LAB — TSH: TSH: 2.73 u[IU]/mL (ref 0.35–5.50)

## 2022-09-21 LAB — LIPID PANEL
Cholesterol: 144 mg/dL (ref 0–200)
HDL: 43.5 mg/dL (ref >39.00)
LDL Cholesterol: 81 mg/dL (ref 0–99)
NonHDL: 100.49
Total CHOL/HDL Ratio: 3
Triglycerides: 95 mg/dL (ref 0.0–149.0)
VLDL: 19 mg/dL (ref 0.0–40.0)

## 2022-09-21 LAB — VITAMIN B12: Vitamin B-12: 415 pg/mL (ref 211–911)

## 2022-09-21 LAB — VITAMIN D 25 HYDROXY (VIT D DEFICIENCY, FRACTURES): VITD: 78.45 ng/mL (ref 30.00–100.00)

## 2022-09-21 LAB — MICROALBUMIN / CREATININE URINE RATIO
Creatinine,U: 267 mg/dL
Microalb Creat Ratio: 0.4 mg/g (ref 0.0–30.0)
Microalb, Ur: 1.1 mg/dL (ref 0.0–1.9)

## 2022-09-21 LAB — HEMOGLOBIN A1C: Hgb A1c MFr Bld: 5.5 % (ref 4.6–6.5)

## 2022-09-21 MED ORDER — CITALOPRAM HYDROBROMIDE 20 MG PO TABS
20.0000 mg | ORAL_TABLET | Freq: Every day | ORAL | 3 refills | Status: DC
Start: 2022-09-21 — End: 2023-09-07

## 2022-09-21 MED ORDER — TIRZEPATIDE 2.5 MG/0.5ML ~~LOC~~ SOAJ: 2.5000 mg | SUBCUTANEOUS | 11 refills | Status: DC

## 2022-09-21 NOTE — Progress Notes (Signed)
The test results show that your current treatment is OK, as the tests are stable.  Please continue the same plan.  There is no other need for change of treatment or further evaluation based on these results, at this time.  thanks 

## 2022-09-21 NOTE — Progress Notes (Signed)
Patient ID: Jodi Keller, female   DOB: 1955/02/18, 66 y.o.   MRN: [id number]         Chief Complaint:: wellness exam and feet paresthesias, anxiety depression, ckd3a, dm, htn, hld       HPI:  Jodi Keller is a 67 y.o. female here for wellness exam; plans to call for eye exam soon, declines covid booster, o/w up to date                Also Pt denies chest pain, increased sob or doe, wheezing, orthopnea, PND, increased LE swelling, palpitations, dizziness or syncope.   Pt denies polydipsia, polyuria, or new focal neuro s/s.    Pt denies fever, wt loss, night sweats, loss of appetite, or other constitutional symptoms   Going to water aerobics 3 times weekly.  Lost job last fall, tyring to look for another .Willing to retry mounjaro after turned down by insurance last yr.  Hasa had mild ot mod worsening depressive symptoms, suicidal ideation, or panic; has ongoing anxiety, mild worsening recently.    Wt Readings from Last 3 Encounters:  09/21/22 (!) 325 lb (147.4 kg)  05/04/22 (!) 319 lb 1.6 oz (144.7 kg)  03/21/22 (!) 315 lb (142.9 kg)   BP Readings from Last 3 Encounters:  09/21/22 122/78  05/27/22 (!) 134/98  05/04/22 138/64   Immunization History  Administered Date(s) Administered   Influenza, Seasonal, Injecte, Preservative Fre 12/20/2011   Influenza,inj,Quad PF,6+ Mos 11/17/2014, 11/23/2017, 09/16/2018, 12/12/2019   Influenza-Unspecified 10/07/2016, 11/23/2017, 11/07/2020   PFIZER(Purple Top)SARS-COV-2 Vaccination 04/17/2019, 05/12/2019, 12/28/2019   Pneumococcal Polysaccharide-23 08/23/1997   Tdap 12/02/2014   Zoster Recombinant(Shingrix) 09/04/2018, 12/21/2018   Health Maintenance Due  Topic Date Due   Medicare Annual Wellness (AWV)  Never done   OPHTHALMOLOGY EXAM  Never done   COVID-19 Vaccine (4 - 2023-24 season) 09/23/2021      Past Medical History:  Diagnosis Date   Anemia    Bilateral carpal tunnel syndrome 12/16/2010   Degenerative joint disease of ankle, left  12/16/2010   Diabetes mellitus without complication (HCC)    diet controlled   Essential hypertension 11/13/2006   Formatting of this note might be different from the original. Formatting of this note might be different from the original. Qualifier: Diagnosis of By: Briscoe Burns CMA, Alvy Beal Last Assessment & Plan: Formatting of this note might be different from the original. stable overall by history and exam, recent data reviewed with pt, and pt to continue medical treatment as before,  to f/u any worsening sympto   GERD (gastroesophageal reflux disease)    History of hiatal hernia    Hyperlipidemia 02/25/2011   Hypertension    Morbid obesity (HCC)    Sleep apnea    wears cpap   Uterine prolapse    Past Surgical History:  Procedure Laterality Date   ANKLE FUSION Right 2022   BALLOON DILATION N/A 12/29/2019   Procedure: BALLOON DILATION;  Surgeon: Hilarie Fredrickson, MD;  Location: [facility];  Service: Endoscopy;  Laterality: N/A;   CESAREAN SECTION     COLONOSCOPY WITH PROPOFOL N/A 12/29/2019   Procedure: COLONOSCOPY WITH PROPOFOL;  Surgeon: Hilarie Fredrickson, MD;  Location: [facility];  Service: Endoscopy;  Laterality: N/A;   ESOPHAGOGASTRODUODENOSCOPY (EGD) WITH PROPOFOL N/A 12/29/2019   Procedure: ESOPHAGOGASTRODUODENOSCOPY (EGD) WITH PROPOFOL;  Surgeon: Hilarie Fredrickson, MD;  Location: [facility];  Service: Endoscopy;  Laterality: N/A;   EYE SURGERY     cataract surgery per left eye  FOOT SURGERY     HYSTEROSCOPY WITH D & C N/A 05/04/2022   Procedure: DILATATION AND CURETTAGE /HYSTEROSCOPY;  Surgeon: Silverio Lay, MD;  Location: [facility];  Service: Gynecology;  Laterality: N/A;   INTRAUTERINE DEVICE (IUD) INSERTION N/A 05/04/2022   Procedure: INTRAUTERINE DEVICE (IUD) INSERTION;  Surgeon: Silverio Lay, MD;  Location: [facility];  Service: Gynecology;  Laterality: N/A;   IUD REMOVAL N/A 05/04/2022   Procedure: INTRAUTERINE DEVICE (IUD) REMOVAL;  Surgeon:  Silverio Lay, MD;  Location: [facility];  Service: Gynecology;  Laterality: N/A;   POLYPECTOMY N/A 05/04/2022   Procedure: POLYPECTOMY;  Surgeon: Silverio Lay, MD;  Location: [facility];  Service: Gynecology;  Laterality: N/A;   REVERSE SHOULDER ARTHROPLASTY Right 02/10/2022   Procedure: REVERSE SHOULDER ARTHROPLASTY;  Surgeon: Beverely Low, MD;  Location: [facility];  Service: Orthopedics;  Laterality: Right;  120 min choice and interscalene block    reports that she has never smoked. She has never used smokeless tobacco. She reports that she does not currently use alcohol. She reports that she does not use drugs. family history includes Cancer in her father, sister, and sister; Dementia in her mother; Diabetes in her maternal grandmother; Thyroid disease in an other family member. No Known Allergies Current Outpatient Medications on File Prior to Visit  Medication Sig Dispense Refill   Ascorbic Acid (VITAMIN C PO) Take 1 tablet by mouth at bedtime.     aspirin 81 MG chewable tablet Chew 1 tablet (81 mg total) by mouth daily. (Patient taking differently: Chew 81 mg by mouth at bedtime.) 30 tablet 0   atorvastatin (LIPITOR) 10 MG tablet Take 1 tablet (10 mg total) by mouth daily. Annual appt is due must see provider for future refills 90 tablet 0   benazepril (LOTENSIN) 20 MG tablet Take 1 tablet (20 mg total) by mouth daily. Annual appt is due must see provider for future refills 90 tablet 0   CALCIUM PO Take 1 tablet by mouth at bedtime.     cholecalciferol (VITAMIN D) 1000 units tablet Take 1 tablet (1,000 Units total) by mouth daily as needed. Often forgets to take and doesn't know strength 30 tablet 3   clotrimazole-betamethasone (LOTRISONE) cream USE AS DIRECTED TWICE DAILY AS NEEDED (Patient taking differently: Apply 1 application  topically daily as needed (Rash).) 15 g 3   diclofenac (VOLTAREN) 75 MG EC tablet Take 75 mg by mouth daily.      hydrochlorothiazide (MICROZIDE) 12.5 MG capsule TAKE 1 CAPSULE (12.5 MG TOTAL) BY MOUTH AT BEDTIME. 90 capsule 2   HYDROcodone-acetaminophen (NORCO) 5-325 MG tablet Take 1-2 tablets by mouth every 6 (six) hours as needed for moderate pain. 30 tablet 0   methocarbamol (ROBAXIN) 500 MG tablet Take 1 tablet (500 mg total) by mouth every 8 (eight) hours as needed for muscle spasms. 40 tablet 1   Multiple Vitamins-Minerals (MULTIVITAMIN & MINERAL PO) Take 1 tablet by mouth at bedtime.      NON FORMULARY Pt uses a cpap nightly     nystatin powder Apply 1 application topically 3 (three) times daily. (Patient taking differently: Apply 1 application  topically daily as needed (irritation).) 60 g 3   omeprazole (PRILOSEC) 20 MG capsule Take 1 capsule (20 mg total) by mouth 2 (two) times daily before a meal. (Patient taking differently: Take 20 mg by mouth daily.) 1 capsule 0   pioglitazone (ACTOS) 30 MG tablet Take 1 tablet (30 mg total) by  mouth daily. 90 tablet 3   No current facility-administered medications on file prior to visit.        ROS:  All others reviewed and negative.  Objective        PE:  BP 122/78 (BP Location: Right Arm, Patient Position: Sitting, Cuff Size: Normal)   Pulse 66   Temp 98.6 F (37 C) (Oral)   Ht 5\' 7"  (1.702 m)   Wt (!) 325 lb (147.4 kg)   LMP 01/12/2011   SpO2 98%   BMI 50.90 kg/m                 Constitutional: Pt appears in NAD               HENT: Head: NCAT.                Right Ear: External ear normal.                 Left Ear: External ear normal.                Eyes: . Pupils are equal, round, and reactive to light. Conjunctivae and EOM are normal               Nose: without d/c or deformity               Neck: Neck supple. Gross normal ROM               Cardiovascular: Normal rate and regular rhythm.                 Pulmonary/Chest: Effort normal and breath sounds without rales or wheezing.                Abd:  Soft, NT, ND, + BS, no organomegaly                Neurological: Pt is alert. At baseline orientation, motor grossly intact               Skin: Skin is warm. No rashes, no other new lesions, LE edema - none               Psychiatric: Pt behavior is normal without agitation , nervous depressed  Micro: none  Cardiac tracings I have personally interpreted today:  none  Pertinent Radiological findings (summarize): none   Lab Results  Component Value Date   WBC 6.1 09/21/2022   HGB 14.5 09/21/2022   HCT 44.2 09/21/2022   PLT 259.0 09/21/2022   GLUCOSE 90 09/21/2022   CHOL 144 09/21/2022   TRIG 95.0 09/21/2022   HDL 43.50 09/21/2022   LDLCALC 81 09/21/2022   ALT 14 09/21/2022   AST 18 09/21/2022   NA 139 09/21/2022   K 3.8 09/21/2022   CL 107 09/21/2022   CREATININE 1.03 09/21/2022   BUN 24 (H) 09/21/2022   CO2 26 09/21/2022   TSH 2.73 09/21/2022   HGBA1C 5.5 09/21/2022   MICROALBUR 1.1 09/21/2022   Assessment/Plan:  TARAJAH MCKERNAN is a 67 y.o. White or Caucasian [1] female with  has a past medical history of Anemia, Bilateral carpal tunnel syndrome (12/16/2010), Degenerative joint disease of ankle, left (12/16/2010), Diabetes mellitus without complication (HCC), Essential hypertension (11/13/2006), GERD (gastroesophageal reflux disease), History of hiatal hernia, Hyperlipidemia (02/25/2011), Hypertension, Morbid obesity (HCC), Sleep apnea, and Uterine prolapse.  Paresthesias Mild, possible neruopathy, to check b12  Encounter for well adult exam with abnormal findings Age and sex  appropriate education and counseling updated with regular exercise and diet Referrals for preventative services - pt to call for eye exam soon Immunizations addressed - declines covid booster, Smoking counseling  - none needed Evidence for depression or other mood disorder - none significant Most recent labs reviewed. 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, no SI or HI, for celexa 20 every day, to f/u any worsening symptoms or concerns  CKD (chronic kidney disease) stage 3, GFR 30-59 ml/min (HCC) Lab Results  Component Value Date   CREATININE 1.03 09/21/2022   Stable overall, cont to avoid nephrotoxins   Diabetes (HCC) Lab Results  Component Value Date   HGBA1C 5.5 09/21/2022   Stable, pt to continue current medical treatment actos 30 every day, add mounjaro 2.5 gm weekly   HTN (hypertension) BP Readings from Last 3 Encounters:  09/21/22 122/78  05/27/22 (!) 134/98  05/04/22 138/64   Stable, pt to continue medical treatment lotensin 20 every day, hct 12.5 qd   Hyperlipidemia Lab Results  Component Value Date   LDLCALC 81 09/21/2022   Uncontrolled,  pt to continue current statin lipitor 10 mg every day, declines increase for now  Followup: Return in about 6 months (around 03/23/2023).  Oliver Barre, MD 09/23/2022 8:10 PM [facility]

## 2022-09-21 NOTE — Patient Instructions (Signed)
Please take all new medication as prescribed - the celexa 20 mg per day  Ok to try for the Mounjaro 2.5 mg weekly again, and call or do the Mychart message in 1 month if you are taking this ok, to have the dose increased to 5 mg  Please continue all other medications as before, and refills have been done if requested.  Please have the pharmacy call with any other refills you may need.  Please continue your efforts at being more active, low cholesterol diet, and weight control.  You are otherwise up to date with prevention measures today.  Please keep your appointments with your specialists as you may have planned  Please go to the LAB at the blood drawing area for the tests to be done  You will be contacted by phone if any changes need to be made immediately.  Otherwise, you will receive a letter about your results with an explanation, but please check with MyChart first.  Please make an Appointment to return in 6 months, or sooner if needed

## 2022-09-22 ENCOUNTER — Other Ambulatory Visit (HOSPITAL_COMMUNITY): Payer: Self-pay

## 2022-09-23 ENCOUNTER — Encounter: Payer: Self-pay | Admitting: Internal Medicine

## 2022-09-23 DIAGNOSIS — R202 Paresthesia of skin: Secondary | ICD-10-CM | POA: Insufficient documentation

## 2022-09-23 NOTE — Assessment & Plan Note (Signed)
Mild to mod, no SI or HI, for celexa 20 every day, to f/u any worsening symptoms or concerns

## 2022-09-23 NOTE — Assessment & Plan Note (Signed)
Lab Results  Component Value Date   LDLCALC 81 09/21/2022   Uncontrolled,  pt to continue current statin lipitor 10 mg every day, declines increase for now

## 2022-09-23 NOTE — Assessment & Plan Note (Signed)
BP Readings from Last 3 Encounters:  09/21/22 122/78  05/27/22 (!) 134/98  05/04/22 138/64   Stable, pt to continue medical treatment lotensin 20 every day, hct 12.5 qd

## 2022-09-23 NOTE — Assessment & Plan Note (Signed)
Lab Results  Component Value Date   CREATININE 1.03 09/21/2022   Stable overall, cont to avoid nephrotoxins

## 2022-09-23 NOTE — Assessment & Plan Note (Signed)
Age and sex appropriate education and counseling updated with regular exercise and diet Referrals for preventative services - pt to call for eye exam soon Immunizations addressed - declines covid booster, Smoking counseling  - none needed Evidence for depression or other mood disorder - none significant Most recent labs reviewed. I have personally reviewed and have noted: 1) the patient's medical and social history 2) The patient's current medications and supplements 3) The patient's height, weight, and BMI have been recorded in the chart

## 2022-09-23 NOTE — Assessment & Plan Note (Signed)
Lab Results  Component Value Date   HGBA1C 5.5 09/21/2022   Stable, pt to continue current medical treatment actos 30 every day, add mounjaro 2.5 gm weekly

## 2022-09-23 NOTE — Assessment & Plan Note (Signed)
Mild, possible neruopathy, to check b12

## 2022-09-24 DIAGNOSIS — E119 Type 2 diabetes mellitus without complications: Secondary | ICD-10-CM | POA: Diagnosis not present

## 2022-09-25 ENCOUNTER — Other Ambulatory Visit: Payer: Self-pay | Admitting: Internal Medicine

## 2022-09-26 ENCOUNTER — Other Ambulatory Visit: Payer: Self-pay

## 2022-09-26 DIAGNOSIS — K080 Exfoliation of teeth due to systemic causes: Secondary | ICD-10-CM | POA: Diagnosis not present

## 2022-09-27 ENCOUNTER — Encounter: Payer: Self-pay | Admitting: Internal Medicine

## 2022-09-28 ENCOUNTER — Other Ambulatory Visit: Payer: Self-pay | Admitting: Internal Medicine

## 2022-09-28 DIAGNOSIS — G4733 Obstructive sleep apnea (adult) (pediatric): Secondary | ICD-10-CM | POA: Diagnosis not present

## 2022-09-28 DIAGNOSIS — F32A Depression, unspecified: Secondary | ICD-10-CM | POA: Diagnosis not present

## 2022-09-29 ENCOUNTER — Other Ambulatory Visit: Payer: Self-pay

## 2022-10-02 DIAGNOSIS — F32A Depression, unspecified: Secondary | ICD-10-CM | POA: Diagnosis not present

## 2022-10-04 ENCOUNTER — Telehealth: Payer: Self-pay

## 2022-10-04 ENCOUNTER — Other Ambulatory Visit (HOSPITAL_COMMUNITY): Payer: Self-pay

## 2022-10-04 NOTE — Telephone Encounter (Signed)
Pharmacy Patient Advocate Encounter   Received notification from RX Request Messages that prior authorization for Mounjaro 2.5MG /0.5ML pen-injectors is required/requested.   Insurance verification completed.   The patient is insured through [facility] .   Per test claim: PA required; PA started via CoverMyMeds. KEY [id number] . Waiting for clinical questions to populate.

## 2022-10-04 NOTE — Telephone Encounter (Signed)
PA request has been Submitted and is awaiting clinical questions to generate. New Encounter created for follow up. For additional info see Pharmacy Prior Auth telephone encounter from 10-04-2022.

## 2022-10-09 DIAGNOSIS — F32A Depression, unspecified: Secondary | ICD-10-CM | POA: Diagnosis not present

## 2022-10-13 NOTE — Telephone Encounter (Signed)
Ok to try to take the Kaopectate OTC instead of the immodium.  We need to try to stay away from controlled substances for diarrhea if possible   thanks

## 2022-10-16 DIAGNOSIS — F32A Depression, unspecified: Secondary | ICD-10-CM | POA: Diagnosis not present

## 2022-10-20 ENCOUNTER — Telehealth: Payer: Self-pay | Admitting: Internal Medicine

## 2022-10-20 NOTE — Telephone Encounter (Signed)
 Pharmacy Patient Advocate Encounter  Questions generated, answered and submitted

## 2022-10-20 NOTE — Telephone Encounter (Signed)
Aundra Millet called from [facility] stating that the Jodi Keller was approve and the effective date is 10/20/22-10/20/23.  Best call back number for Jodi Keller [phone] opt 5

## 2022-10-23 DIAGNOSIS — F32A Depression, unspecified: Secondary | ICD-10-CM | POA: Diagnosis not present

## 2022-10-24 DIAGNOSIS — E119 Type 2 diabetes mellitus without complications: Secondary | ICD-10-CM | POA: Diagnosis not present

## 2022-10-28 DIAGNOSIS — G4733 Obstructive sleep apnea (adult) (pediatric): Secondary | ICD-10-CM | POA: Diagnosis not present

## 2022-10-30 DIAGNOSIS — F32A Depression, unspecified: Secondary | ICD-10-CM | POA: Diagnosis not present

## 2022-11-01 NOTE — Telephone Encounter (Signed)
Pharmacy Patient Advocate Encounter  Received notification from [facility] that Prior Authorization for [facility] 2.5MG /0.5ML pen-injectors has been APPROVED from 10-20-2022 to 10-20-2023   PA #/Case ID/Reference #: [id number] *Re-submitted due to first request expiring

## 2022-11-06 DIAGNOSIS — F32A Depression, unspecified: Secondary | ICD-10-CM | POA: Diagnosis not present

## 2022-11-20 ENCOUNTER — Encounter: Payer: Self-pay | Admitting: Internal Medicine

## 2022-11-20 MED ORDER — TIRZEPATIDE 5 MG/0.5ML ~~LOC~~ SOAJ: 5.0000 mg | SUBCUTANEOUS | 3 refills | Status: DC

## 2022-11-28 DIAGNOSIS — G4733 Obstructive sleep apnea (adult) (pediatric): Secondary | ICD-10-CM | POA: Diagnosis not present

## 2023-01-02 DIAGNOSIS — Z01419 Encounter for gynecological examination (general) (routine) without abnormal findings: Secondary | ICD-10-CM | POA: Diagnosis not present

## 2023-01-02 DIAGNOSIS — Z1231 Encounter for screening mammogram for malignant neoplasm of breast: Secondary | ICD-10-CM | POA: Diagnosis not present

## 2023-01-03 DIAGNOSIS — L2989 Other pruritus: Secondary | ICD-10-CM | POA: Diagnosis not present

## 2023-01-03 DIAGNOSIS — D225 Melanocytic nevi of trunk: Secondary | ICD-10-CM | POA: Diagnosis not present

## 2023-01-03 DIAGNOSIS — L570 Actinic keratosis: Secondary | ICD-10-CM | POA: Diagnosis not present

## 2023-01-03 DIAGNOSIS — D1723 Benign lipomatous neoplasm of skin and subcutaneous tissue of right leg: Secondary | ICD-10-CM | POA: Diagnosis not present

## 2023-01-03 DIAGNOSIS — L814 Other melanin hyperpigmentation: Secondary | ICD-10-CM | POA: Diagnosis not present

## 2023-01-04 ENCOUNTER — Ambulatory Visit: Payer: Medicare Other

## 2023-01-04 VITALS — BP 100/72 | HR 60 | Ht 66.5 in | Wt 323.4 lb

## 2023-01-04 DIAGNOSIS — Z0000 Encounter for general adult medical examination without abnormal findings: Secondary | ICD-10-CM | POA: Diagnosis not present

## 2023-01-04 NOTE — Patient Instructions (Signed)
Jodi Keller , Thank you for taking time to come for your Medicare Wellness Visit. I appreciate your ongoing commitment to your health goals. Please review the following plan we discussed and let me know if I can assist you in the future.   Referrals/Orders/Follow-Ups/Clinician Recommendations: You are due for a 2 Pneumonia vaccine, which you can get at your local pharmacy.  Remember to call your eye doctor and schedule an appointment to been seen soon.    This is a list of the screening recommended for you and due dates:  Health Maintenance  Topic Date Due   Eye exam for diabetics  Never done   COVID-19 Vaccine (5 - 2024-25 season) 12/22/2022   Pneumonia Vaccine (2 of 2 - PCV) 03/22/2023*   Hemoglobin A1C  03/23/2023   Yearly kidney function blood test for diabetes  09/21/2023   Yearly kidney health urinalysis for diabetes  09/21/2023   Complete foot exam   09/21/2023   Medicare Annual Wellness Visit  01/04/2024   DTaP/Tdap/Td vaccine (2 - Td or Tdap) 12/01/2024   Mammogram  01/01/2025   Colon Cancer Screening  12/28/2029   Flu Shot  Completed   DEXA scan (bone density measurement)  Completed   Hepatitis C Screening  Completed   Zoster (Shingles) Vaccine  Completed   HPV Vaccine  Aged Out  *Topic was postponed. The date shown is not the original due date.    Advanced directives: (Copy Requested) Please bring a copy of your health care power of attorney and living will to the office to be added to your chart at your convenience.  Next Medicare Annual Wellness Visit scheduled for next year: Yes

## 2023-01-04 NOTE — Progress Notes (Addendum)
Subjective:   Jodi Keller is a 67 y.o. female who presents for an Initial Medicare Annual Wellness Visit.  Visit Complete: In person  Patient Medicare AWV questionnaire was completed by the patient on 12/058/2024; I have confirmed that all information answered by patient is correct and no changes since this date.  Cardiac Risk Factors include: advanced age (>64men, >61 women);hypertension;diabetes mellitus;obesity (BMI >30kg/m2);dyslipidemia     Objective:    Today's Vitals   01/04/23 1032  BP: 100/72  Pulse: 60  SpO2: 99%  Weight: (!) 323 lb 6.4 oz (146.7 kg)  Height: 5' 6.5" (1.689 m)   Body mass index is 51.42 kg/m.     01/04/2023   10:40 AM 05/27/2022    7:43 PM 05/04/2022    6:37 AM 02/10/2022    1:45 PM 02/10/2022    5:52 AM 01/30/2022   10:49 AM 03/17/2021    8:13 AM  Advanced Directives  Does Patient Have a Medical Advance Directive? Yes No Yes No Yes Yes No  Type of Estate agent of [place];Living will    Healthcare Power of [place];Living will Healthcare Power of [place];Living will   Does patient want to make changes to medical advance directive?   No - Patient declined No - Patient declined No - Patient declined No - Patient declined   Copy of Healthcare Power of Attorney in Chart? No - copy requested    No - copy requested No - copy requested   Would patient like information on creating a medical advance directive?  No - Patient declined  No - Patient declined   No - Patient declined    Current Medications (verified) Outpatient Encounter Medications as of 01/04/2023  Medication Sig   Ascorbic Acid (VITAMIN C PO) Take 1 tablet by mouth at bedtime.   aspirin 81 MG chewable tablet Chew 1 tablet (81 mg total) by mouth daily. (Patient taking differently: Chew 81 mg by mouth at bedtime.)   atorvastatin (LIPITOR) 10 MG tablet TAKE 1 TABLET (10 MG TOTAL) BY MOUTH DAILY. ANNUAL APPT IS DUE MUST SEE PROVIDER FOR FUTURE REFILLS   benazepril  (LOTENSIN) 20 MG tablet TAKE 1 TABLET (20 MG TOTAL) BY MOUTH DAILY. ANNUAL APPT IS DUE MUST SEE PROVIDER FOR FUTURE REFILLS   CALCIUM PO Take 1 tablet by mouth at bedtime.   cholecalciferol (VITAMIN D) 1000 units tablet Take 1 tablet (1,000 Units total) by mouth daily as needed. Often forgets to take and doesn't know strength   citalopram (CELEXA) 20 MG tablet Take 1 tablet (20 mg total) by mouth daily.   diclofenac (VOLTAREN) 75 MG EC tablet Take 75 mg by mouth daily.   hydrochlorothiazide (MICROZIDE) 12.5 MG capsule TAKE 1 CAPSULE (12.5 MG TOTAL) BY MOUTH AT BEDTIME.   Multiple Vitamins-Minerals (MULTIVITAMIN & MINERAL PO) Take 1 tablet by mouth at bedtime.    NON FORMULARY Pt uses a cpap nightly   nystatin powder Apply 1 application topically 3 (three) times daily. (Patient taking differently: Apply 1 application  topically daily as needed (irritation).)   omeprazole (PRILOSEC) 20 MG capsule Take 1 capsule (20 mg total) by mouth 2 (two) times daily before a meal. (Patient taking differently: Take 20 mg by mouth daily.)   pioglitazone (ACTOS) 30 MG tablet Take 1 tablet (30 mg total) by mouth daily.   tirzepatide [facility]) 5 MG/0.5ML Pen Inject 5 mg into the skin once a week.   clotrimazole-betamethasone (LOTRISONE) cream USE AS DIRECTED TWICE DAILY AS NEEDED (Patient  taking differently: Apply 1 application  topically daily as needed (Rash).)   HYDROcodone-acetaminophen (NORCO) 5-325 MG tablet Take 1-2 tablets by mouth every 6 (six) hours as needed for moderate pain. (Patient not taking: Reported on 01/04/2023)   methocarbamol (ROBAXIN) 500 MG tablet Take 1 tablet (500 mg total) by mouth every 8 (eight) hours as needed for muscle spasms.   No facility-administered encounter medications on file as of 01/04/2023.    Allergies (verified) Patient has no known allergies.   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 Formatting of this note might be different from the original. Qualifier: Diagnosis of By: Briscoe Burns CMA, Alvy Beal Last Assessment & Plan: Formatting of this note might be different from the original. stable overall by history and exam, recent data reviewed with pt, and pt to continue medical treatment as before,  to f/u any worsening sympto   GERD (gastroesophageal reflux disease)    History of hiatal hernia    Hyperlipidemia 02/25/2011   Hypertension    Morbid obesity (HCC)    Sleep apnea    wears cpap   Uterine prolapse    Past Surgical History:  Procedure Laterality Date   ANKLE FUSION Right 2022   BALLOON DILATION N/A 12/29/2019   Procedure: BALLOON DILATION;  Surgeon: Hilarie Fredrickson, MD;  Location: [facility];  Service: Endoscopy;  Laterality: N/A;   CESAREAN SECTION     COLONOSCOPY WITH PROPOFOL N/A 12/29/2019   Procedure: COLONOSCOPY WITH PROPOFOL;  Surgeon: Hilarie Fredrickson, MD;  Location: [facility];  Service: Endoscopy;  Laterality: N/A;   ESOPHAGOGASTRODUODENOSCOPY (EGD) WITH PROPOFOL N/A 12/29/2019   Procedure: ESOPHAGOGASTRODUODENOSCOPY (EGD) WITH PROPOFOL;  Surgeon: Hilarie Fredrickson, MD;  Location: [facility];  Service: Endoscopy;  Laterality: N/A;   EYE SURGERY     cataract surgery per left eye    FOOT SURGERY     HYSTEROSCOPY WITH D & C N/A 05/04/2022   Procedure: DILATATION AND CURETTAGE /HYSTEROSCOPY;  Surgeon: Silverio Lay, MD;  Location: [facility];  Service: Gynecology;  Laterality: N/A;   INTRAUTERINE DEVICE (IUD) INSERTION N/A 05/04/2022   Procedure: INTRAUTERINE DEVICE (IUD) INSERTION;  Surgeon: Silverio Lay, MD;  Location: [facility];  Service: Gynecology;  Laterality: N/A;   IUD REMOVAL N/A 05/04/2022   Procedure: INTRAUTERINE  DEVICE (IUD) REMOVAL;  Surgeon: Silverio Lay, MD;  Location: [facility];  Service: Gynecology;  Laterality: N/A;   POLYPECTOMY N/A 05/04/2022   Procedure: POLYPECTOMY;  Surgeon: Silverio Lay, MD;  Location: [facility];  Service: Gynecology;  Laterality: N/A;   REVERSE SHOULDER ARTHROPLASTY Right 02/10/2022   Procedure: REVERSE SHOULDER ARTHROPLASTY;  Surgeon: Beverely Low, MD;  Location: [facility];  Service: Orthopedics;  Laterality: Right;  120 min choice and interscalene block   Family History  Problem Relation Age of Onset   Dementia Mother    Cancer Father        Prostate   Cancer Sister        Multiple Myeloma   Cancer Sister        Breast   Diabetes Maternal Grandmother    Thyroid disease Other        Multiple   Colon cancer  Neg Hx    Esophageal cancer Neg Hx    Pancreatic cancer Neg Hx    Stomach cancer Neg Hx    Social History   Socioeconomic History   Marital status: Widowed    Spouse name: Not on file   Number of children: 2   Years of education: Not on file   Highest education level: Not on file  Occupational History   Occupation: Not employed  Tobacco Use   Smoking status: Never   Smokeless tobacco: Never  Vaping Use   Vaping status: Never Used  Substance and Sexual Activity   Alcohol use: Not Currently    Comment: rare   Drug use: No   Sexual activity: Not Currently    Birth control/protection: Other-see comments    Comment: postmenopausal  Other Topics Concern   Not on file  Social History Narrative   Lives alone.   Social Drivers of Health   Financial Resource Strain: Patient Declined (12/31/2022)   Overall Financial Resource Strain (CARDIA)    Difficulty of Paying Living Expenses: Patient declined  Food Insecurity: No Food Insecurity (12/31/2022)   Hunger Vital Sign    Worried About Running Out of Food in the Last Year: Never true    Ran Out of Food in the Last Year: Never true  Transportation Needs: No  Transportation Needs (12/31/2022)   PRAPARE - Administrator, Civil Service (Medical): No    Lack of Transportation (Non-Medical): No  Physical Activity: Sufficiently Active (12/31/2022)   Exercise Vital Sign    Days of Exercise per Week: 3 days    Minutes of Exercise per Session: 60 min  Stress: No Stress Concern Present (12/31/2022)   [employer] of Occupational Health - Occupational Stress Questionnaire    Feeling of Stress : Only a little  Social Connections: Unknown (12/31/2022)   Social Connection and Isolation Panel [NHANES]    Frequency of Communication with Friends and Family: More than three times a week    Frequency of Social Gatherings with Friends and Family: Once a week    Attends Religious Services: Not on Marketing executive or Organizations: Yes    Attends Engineer, structural: More than 4 times per year    Marital Status: Divorced    Tobacco Counseling Counseling given: Not Answered   Clinical Intake:  Pre-visit preparation completed: Yes  Pain : No/denies pain     BMI - recorded: 51.42 Nutritional Status: BMI > 30  Obese Nutritional Risks: None Diabetes: Yes CBG done?: No Did pt. bring in CBG monitor from home?: No  How often do you need to have someone help you when you read instructions, pamphlets, or other written materials from your doctor or pharmacy?: 1 - Never  Interpreter Needed?: No  Information entered by :: Isao Seltzer, RMA   Activities of Daily Living    12/31/2022    4:46 PM 05/04/2022    6:44 AM  In your present state of health, do you have any difficulty performing the following activities:  Hearing? 0 0  Vision? 0 0  Difficulty concentrating or making decisions? 0 0  Walking or climbing stairs? 0 1  Dressing or bathing? 0 1  Doing errands, shopping? 0   Preparing Food and eating ? N   Using the Toilet? N   In the past six months, have you accidently leaked urine? N   Do you have problems  with loss of bowel control? N  Managing your Medications? N   Managing your Finances? N   Housekeeping or managing your Housekeeping? N     Patient Care Team: Corwin Levins, MD as PCP - General  Indicate any recent Medical Services you may have received from other than [facility] providers in the past year (date may be approximate).     Assessment:   This is a routine wellness examination for Aaradhya.  Hearing/Vision screen Hearing Screening - Comments:: Denies hearing difficulties   Vision Screening - Comments:: Wears when driving.   Goals Addressed               This Visit's Progress     Patient Stated (pt-stated)        To get healthier and lose some weight.      Depression Screen    01/04/2023   10:45 AM 09/21/2022   10:15 AM 03/21/2022    9:27 AM 07/21/2021    4:04 PM 12/13/2020    3:51 PM 06/11/2019    4:26 PM 06/10/2018    7:11 PM  PHQ 2/9 Scores  PHQ - 2 Score 0 1 0 0 0 1 0  PHQ- 9 Score 0 3 0        Fall Risk    12/31/2022    4:46 PM 09/21/2022   10:15 AM 03/21/2022    9:27 AM 07/21/2021    4:04 PM 12/13/2020    3:50 PM  Fall Risk   Falls in the past year? 0 0 0 0 0  Number falls in past yr: 0 0 0    Injury with Fall? 0 0 0 0 0  Risk for fall due to : No Fall Risks No Fall Risks No Fall Risks    Follow up Falls prevention discussed;Falls evaluation completed Falls evaluation completed Falls evaluation completed;Education provided      MEDICARE RISK AT HOME: Medicare Risk at Home Any stairs in or around the home?: No Home free of loose throw rugs in walkways, pet beds, electrical cords, etc?: Yes Adequate lighting in your home to reduce risk of falls?: Yes Life alert?: No Use of a cane, walker or w/c?: Yes (walker) Grab bars in the bathroom?: Yes Shower chair or bench in shower?: No Elevated toilet seat or a handicapped toilet?: No  TIMED UP AND GO:  Was the test performed? Yes  Length of time to ambulate 10 feet: 25 sec Gait slow and steady with  assistive device    Cognitive Function:        01/04/2023   10:43 AM  6CIT Screen  What Year? 0 points  What month? 0 points  What time? 0 points  Count back from 20 0 points  Months in reverse 0 points  Repeat phrase 2 points  Total Score 2 points    Immunizations Immunization History  Administered Date(s) Administered   Influenza, High Dose Seasonal PF 10/27/2022   Influenza, Seasonal, Injecte, Preservative Fre 12/20/2011   Influenza,inj,Quad PF,6+ Mos 11/17/2014, 11/23/2017, 09/16/2018, 12/12/2019   Influenza-Unspecified 10/07/2016, 11/23/2017, 11/07/2020   PFIZER(Purple Top)SARS-COV-2 Vaccination 04/17/2019, 05/12/2019, 12/28/2019   Pfizer(Comirnaty)Fall Seasonal Vaccine 12 years and older 10/27/2022   Pneumococcal Polysaccharide-23 08/23/1997   Tdap 12/02/2014   Zoster Recombinant(Shingrix) 09/04/2018, 12/21/2018    TDAP status: Up to date  Flu Vaccine status: Up to date  Pneumococcal vaccine status: Due, Education has been provided regarding the importance of this vaccine. Advised may receive this vaccine at local pharmacy or Health Dept. Aware to provide a copy  of the vaccination record if obtained from local pharmacy or Health Dept. Verbalized acceptance and understanding.  Covid-19 vaccine status: Completed vaccines  Qualifies for Shingles Vaccine? Yes   Zostavax completed Yes   Shingrix Completed?: Yes  Screening Tests Health Maintenance  Topic Date Due   OPHTHALMOLOGY EXAM  08/16/2022   COVID-19 Vaccine (5 - 2024-25 season) 12/22/2022   Pneumonia Vaccine 10+ Years old (2 of 2 - PCV) 03/22/2023 (Originally 08/24/1998)   HEMOGLOBIN A1C  03/23/2023   Diabetic kidney evaluation - eGFR measurement  09/21/2023   Diabetic kidney evaluation - Urine ACR  09/21/2023   FOOT EXAM  09/21/2023   Medicare Annual Wellness (AWV)  01/04/2024   DTaP/Tdap/Td (2 - Td or Tdap) 12/01/2024   MAMMOGRAM  01/01/2025   Colonoscopy  12/28/2029   INFLUENZA VACCINE  Completed    DEXA SCAN  Completed   Hepatitis C Screening  Completed   Zoster Vaccines- Shingrix  Completed   HPV VACCINES  Aged Out    Health Maintenance  Health Maintenance Due  Topic Date Due   OPHTHALMOLOGY EXAM  08/16/2022   COVID-19 Vaccine (5 - 2024-25 season) 12/22/2022    Colorectal cancer screening: Type of screening: Colonoscopy. Completed 12/29/2019. Repeat every 10 years  Mammogram status: Completed 01/02/2023. Repeat every year  Bone Density status: Completed 06/13/2022. Results reflect: Bone density results: OSTEOPENIA. Repeat every 2 years.  Lung Cancer Screening: (Low Dose CT Chest recommended if Age 79-80 years, 20 pack-year currently smoking OR have quit w/in 15years.) does not qualify.   Lung Cancer Screening Referral: N/A  Additional Screening:  Hepatitis C Screening: does qualify; Completed 11/17/2014  Vision Screening: Recommended annual ophthalmology exams for early detection of glaucoma and other disorders of the eye. Is the patient up to date with their annual eye exam?  No  Who is the provider or what is the name of the office in which the patient attends annual eye exams? Dr. Cathey Endow [facility]) If pt is not established with a provider, would they like to be referred to a provider to establish care? No .   Dental Screening: Recommended annual dental exams for proper oral hygiene  Diabetic Foot Exam: Diabetic Foot Exam: Completed 09/21/2022  Community Resource Referral / Chronic Care Management: CRR required this visit?  No   CCM required this visit?  No     Plan:     I have personally reviewed and noted the following in the patient's chart:   Medical and social history Use of alcohol, tobacco or illicit drugs  Current medications and supplements including opioid prescriptions. Patient is not currently taking opioid prescriptions. Functional ability and status Nutritional status Physical activity Advanced directives List of other  physicians Hospitalizations, surgeries, and ER visits in previous 12 months Vitals Screenings to include cognitive, depression, and falls Referrals and appointments  In addition, I have reviewed and discussed with patient certain preventive protocols, quality metrics, and best practice recommendations. A written personalized care plan for preventive services as well as general preventive health recommendations were provided to patient.     Azusena Erlandson L Louvinia Cumbo, CMA   01/15/2023   After Visit Summary: (MyChart) Due to this being a telephonic visit, the after visit summary with patients personalized plan was offered to patient via MyChart   Nurse Notes: Patient is due for a 2nd Pneumonia vaccine, which she will get at the pharmacy.  She is also due for an eye exam and she stated that she will call to set that  appointment up.  Patient had some concerns today in regards to her DM-Type 2.  She is currently taking Mounjaro 5mg  subQ and also taking Actos, which she is want to know if she should be taking both.  I told her that I would put her question in this note today and ask provider to reach out to her via mychart.  Patient is also concerned about what she should be eating since she has recently been diagnosed with DM.  I did not have any pamphlets to give her today but I did inform patient to cut back on sugar and carbs and exercise, which she does.

## 2023-01-15 DIAGNOSIS — G4733 Obstructive sleep apnea (adult) (pediatric): Secondary | ICD-10-CM | POA: Diagnosis not present

## 2023-02-15 DIAGNOSIS — G4733 Obstructive sleep apnea (adult) (pediatric): Secondary | ICD-10-CM | POA: Diagnosis not present

## 2023-02-23 DIAGNOSIS — H5203 Hypermetropia, bilateral: Secondary | ICD-10-CM | POA: Diagnosis not present

## 2023-02-23 LAB — HM DIABETES EYE EXAM

## 2023-03-18 DIAGNOSIS — G4733 Obstructive sleep apnea (adult) (pediatric): Secondary | ICD-10-CM | POA: Diagnosis not present

## 2023-03-23 ENCOUNTER — Encounter: Payer: Self-pay | Admitting: Internal Medicine

## 2023-03-23 ENCOUNTER — Ambulatory Visit: Payer: Medicare Other | Admitting: Internal Medicine

## 2023-03-23 VITALS — BP 132/78 | HR 58 | Temp 98.6°F | Ht 66.5 in | Wt 319.0 lb

## 2023-03-23 DIAGNOSIS — E1165 Type 2 diabetes mellitus with hyperglycemia: Secondary | ICD-10-CM | POA: Diagnosis not present

## 2023-03-23 DIAGNOSIS — E7800 Pure hypercholesterolemia, unspecified: Secondary | ICD-10-CM

## 2023-03-23 DIAGNOSIS — Z7984 Long term (current) use of oral hypoglycemic drugs: Secondary | ICD-10-CM

## 2023-03-23 DIAGNOSIS — I10 Essential (primary) hypertension: Secondary | ICD-10-CM

## 2023-03-23 DIAGNOSIS — E559 Vitamin D deficiency, unspecified: Secondary | ICD-10-CM | POA: Diagnosis not present

## 2023-03-23 DIAGNOSIS — R269 Unspecified abnormalities of gait and mobility: Secondary | ICD-10-CM

## 2023-03-23 DIAGNOSIS — Z23 Encounter for immunization: Secondary | ICD-10-CM

## 2023-03-23 DIAGNOSIS — Z7985 Long-term (current) use of injectable non-insulin antidiabetic drugs: Secondary | ICD-10-CM

## 2023-03-23 LAB — LIPID PANEL
Cholesterol: 148 mg/dL (ref 0–200)
HDL: 45.5 mg/dL (ref >39.00)
LDL Cholesterol: 87 mg/dL (ref 0–99)
NonHDL: 102.91
Total CHOL/HDL Ratio: 3
Triglycerides: 79 mg/dL (ref 0.0–149.0)
VLDL: 15.8 mg/dL (ref 0.0–40.0)

## 2023-03-23 LAB — HEPATIC FUNCTION PANEL
ALT: 12 U/L (ref 0–35)
AST: 18 U/L (ref 0–37)
Albumin: 3.7 g/dL (ref 3.5–5.2)
Alkaline Phosphatase: 66 U/L (ref 39–117)
Bilirubin, Direct: 0.2 mg/dL (ref 0.0–0.3)
Total Bilirubin: 0.8 mg/dL (ref 0.2–1.2)
Total Protein: 7.1 g/dL (ref 6.0–8.3)

## 2023-03-23 LAB — BASIC METABOLIC PANEL
BUN: 22 mg/dL (ref 6–23)
CO2: 28 meq/L (ref 19–32)
Calcium: 9.5 mg/dL (ref 8.4–10.5)
Chloride: 105 meq/L (ref 96–112)
Creatinine, Ser: 1.11 mg/dL (ref 0.40–1.20)
GFR: 51.36 mL/min — ABNORMAL LOW (ref >60.00)
Glucose, Bld: 84 mg/dL (ref 70–99)
Potassium: 4.2 meq/L (ref 3.5–5.1)
Sodium: 140 meq/L (ref 135–145)

## 2023-03-23 LAB — HEMOGLOBIN A1C: Hgb A1c MFr Bld: 5.1 % (ref 4.6–6.5)

## 2023-03-23 LAB — VITAMIN D 25 HYDROXY (VIT D DEFICIENCY, FRACTURES): VITD: 82.47 ng/mL (ref 30.00–100.00)

## 2023-03-23 MED ORDER — TIRZEPATIDE 7.5 MG/0.5ML ~~LOC~~ SOAJ: 7.5000 mg | SUBCUTANEOUS | 3 refills | Status: DC

## 2023-03-23 NOTE — Progress Notes (Signed)
 The test results show that your current treatment is OK, as the tests are stable.  Please continue the same plan.  There is no other need for change of treatment or further evaluation based on these results, at this time.  thanks

## 2023-03-23 NOTE — Patient Instructions (Signed)
 You had the Prevnar 20 pneumonia shot today  Ok to increase the mounjaro to 7.5 mg weekly  Please continue all other medications as before, and refills have been done if requested.  Please have the pharmacy call with any other refills you may need.  Please continue your efforts at being more active, low cholesterol diet, and weight control.  You are otherwise up to date with prevention measures today.  Please keep your appointments with your specialists as you may have planned  You will be contacted regarding the referral for: Diabetes education, and Physical Therapy  Please go to the LAB at the blood drawing area for the tests to be done  You will be contacted by phone if any changes need to be made immediately.  Otherwise, you will receive a letter about your results with an explanation, but please check with MyChart first.  Please make an Appointment to return in 6 months, or sooner if needed

## 2023-03-23 NOTE — Progress Notes (Signed)
 Patient ID: Jodi Keller, female   DOB: 08/26/1955, 68 y.o.   MRN: [id number]        Chief Complaint: follow up HTN, HLD, DM, obesity, gait difficulty       HPI:  Jodi Keller is a 68 y.o. female here overall doing ok, Pt denies chest pain, increased sob or doe, wheezing, orthopnea, PND, increased LE swelling, palpitations, dizziness or syncope.   Pt denies polydipsia, polyuria, or new focal neuro s/s.    Pt denies polydipsia, polyuria, or new focal neuro s/s.    Pt denies fever, wt loss, night sweats, loss of appetite, or other constitutional symptoms  Also with worsening gait difficulty and generalized weakness.  Due for prevnar 20       Wt Readings from Last 3 Encounters:  03/23/23 (!) 319 lb (144.7 kg)  01/04/23 (!) 323 lb 6.4 oz (146.7 kg)  09/21/22 (!) 325 lb (147.4 kg)   BP Readings from Last 3 Encounters:  03/23/23 132/78  01/04/23 100/72  09/21/22 122/78         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 Formatting of this note might be different from the original. Qualifier: Diagnosis of By: Briscoe Burns CMA, Alvy Beal Last Assessment & Plan: Formatting of this note might be different from the original. stable overall by history and exam, recent data reviewed with pt, and pt to continue medical treatment as before,  to f/u any worsening sympto   GERD (gastroesophageal reflux disease)    History of hiatal hernia    Hyperlipidemia 02/25/2011   Hypertension    Morbid obesity (HCC)    Sleep apnea    wears cpap   Uterine prolapse    Past Surgical History:  Procedure Laterality Date   ANKLE FUSION Right 2022   BALLOON DILATION N/A 12/29/2019   Procedure: BALLOON DILATION;  Surgeon: Hilarie Fredrickson, MD;  Location:  [facility];  Service: Endoscopy;  Laterality: N/A;   CESAREAN SECTION     COLONOSCOPY WITH PROPOFOL N/A 12/29/2019   Procedure: COLONOSCOPY WITH PROPOFOL;  Surgeon: Hilarie Fredrickson, MD;  [facility];  Service: Endoscopy;  Laterality: N/A;   ESOPHAGOGASTRODUODENOSCOPY (EGD) WITH PROPOFOL N/A 12/29/2019   Procedure: ESOPHAGOGASTRODUODENOSCOPY (EGD) WITH PROPOFOL;  Surgeon: Hilarie Fredrickson, MD;  [facility];  Service: Endoscopy;  Laterality: N/A;   EYE SURGERY     cataract surgery per left eye    FOOT SURGERY     HYSTEROSCOPY WITH D & C N/A 05/04/2022   Procedure: DILATATION AND CURETTAGE /HYSTEROSCOPY;  Surgeon: Silverio Lay, MD;  Location:  [place];  Service: Gynecology;  Laterality: N/A;   INTRAUTERINE DEVICE (IUD) INSERTION N/A 05/04/2022   Procedure: INTRAUTERINE DEVICE (IUD) INSERTION;  Surgeon: Silverio Lay, MD;  Location: [facility];  Service: Gynecology;  Laterality: N/A;   IUD REMOVAL N/A 05/04/2022   Procedure: INTRAUTERINE DEVICE (IUD) REMOVAL;  Surgeon: Silverio Lay, MD;  Location: [facility] ;  Service: Gynecology;  Laterality: N/A;   POLYPECTOMY N/A 05/04/2022   Procedure: POLYPECTOMY;  Surgeon: Silverio Lay, MD;  Location: [facility];  Service: Gynecology;  Laterality: N/A;   REVERSE SHOULDER ARTHROPLASTY Right 02/10/2022   Procedure: REVERSE SHOULDER  ARTHROPLASTY;  Surgeon: Beverely Low, MD;  [facility];  Service: Orthopedics;  Laterality: Right;  120 min choice and interscalene block    reports that she has never smoked. She has never used smokeless tobacco. She reports that she does not currently use alcohol. She reports that she does not use drugs. family history includes Cancer in her father, sister, and sister; Dementia in her mother; Diabetes in her maternal grandmother; Thyroid disease in an other family member. No Known Allergies Current Outpatient Medications on File Prior to  Visit  Medication Sig Dispense Refill   Ascorbic Acid (VITAMIN C PO) Take 1 tablet by mouth at bedtime.     aspirin 81 MG chewable tablet Chew 1 tablet (81 mg total) by mouth daily. (Patient taking differently: Chew 81 mg by mouth at bedtime.) 30 tablet 0   atorvastatin (LIPITOR) 10 MG tablet TAKE 1 TABLET (10 MG TOTAL) BY MOUTH DAILY. ANNUAL APPT IS DUE MUST SEE PROVIDER FOR FUTURE REFILLS 90 tablet 3   benazepril (LOTENSIN) 20 MG tablet TAKE 1 TABLET (20 MG TOTAL) BY MOUTH DAILY. ANNUAL APPT IS DUE MUST SEE PROVIDER FOR FUTURE REFILLS 90 tablet 3   CALCIUM PO Take 1 tablet by mouth at bedtime.     cholecalciferol (VITAMIN D) 1000 units tablet Take 1 tablet (1,000 Units total) by mouth daily as needed. Often forgets to take and doesn't know strength 30 tablet 3   citalopram (CELEXA) 20 MG tablet Take 1 tablet (20 mg total) by mouth daily. 90 tablet 3   clotrimazole-betamethasone (LOTRISONE) cream USE AS DIRECTED TWICE DAILY AS NEEDED (Patient taking differently: Apply 1 application  topically daily as needed (Rash).) 15 g 3   diclofenac (VOLTAREN) 75 MG EC tablet Take 75 mg by mouth daily.     hydrochlorothiazide (MICROZIDE) 12.5 MG capsule TAKE 1 CAPSULE (12.5 MG TOTAL) BY MOUTH AT BEDTIME. 90 capsule 2   HYDROcodone-acetaminophen (NORCO) 5-325 MG tablet Take 1-2 tablets by mouth every 6 (six) hours as needed for moderate pain. 30 tablet 0   Multiple Vitamins-Minerals (MULTIVITAMIN & MINERAL PO) Take 1 tablet by mouth at bedtime.      NON FORMULARY Pt uses a cpap nightly     nystatin powder Apply 1 application topically 3 (three) times daily. (Patient taking differently: Apply 1 application  topically daily as needed (irritation).) 60 g 3   omeprazole (PRILOSEC) 20 MG capsule Take 1 capsule (20 mg total) by mouth 2 (two) times daily before a meal. (Patient taking differently: Take 20 mg by mouth daily.) 1 capsule 0   pioglitazone (ACTOS) 30 MG tablet Take 1 tablet (30 mg total) by mouth daily. 90  tablet 3   No current facility-administered medications on file prior to visit.        ROS:  All others reviewed and negative.  Objective        PE:  BP 132/78 (BP Location: Right Arm, Patient Position: Sitting, Cuff Size: Normal)   Pulse (!) 58   Temp 98.6 F (37 C) (Oral)   Ht 5' 6.5" (1.689 m)   Wt (!) 319 lb (144.7 kg)   LMP 01/12/2011   SpO2 99%   BMI 50.72 kg/m                 Constitutional: Pt appears in NAD               HENT: Head: NCAT.  Right Ear: External ear normal.                 Left Ear: External ear normal.                Eyes: . Pupils are equal, round, and reactive to light. Conjunctivae and EOM are normal               Nose: without d/c or deformity               Neck: Neck supple. Gross normal ROM               Cardiovascular: Normal rate and regular rhythm.                 Pulmonary/Chest: Effort normal and breath sounds without rales or wheezing.                Abd:  Soft, NT, ND, + BS, no organomegaly               Neurological: Pt is alert. At baseline orientation, motor grossly intact               Skin: Skin is warm. No rashes, no other new lesions, LE edema - trace                Psychiatric: Pt behavior is normal without agitation   Micro: none  Cardiac tracings I have personally interpreted today:  none  Pertinent Radiological findings (summarize): none   Lab Results  Component Value Date   WBC 6.1 09/21/2022   HGB 14.5 09/21/2022   HCT 44.2 09/21/2022   PLT 259.0 09/21/2022   GLUCOSE 84 03/23/2023   CHOL 148 03/23/2023   TRIG 79.0 03/23/2023   HDL 45.50 03/23/2023   LDLCALC 87 03/23/2023   ALT 12 03/23/2023   AST 18 03/23/2023   NA 140 03/23/2023   K 4.2 03/23/2023   CL 105 03/23/2023   CREATININE 1.11 03/23/2023   BUN 22 03/23/2023   CO2 28 03/23/2023   TSH 2.73 09/21/2022   HGBA1C 5.1 03/23/2023   MICROALBUR 1.1 09/21/2022   Assessment/Plan:  TENISHIA EKMAN is a 68 y.o. White or Caucasian [1] female with   has a past medical history of Anemia, Bilateral carpal tunnel syndrome (12/16/2010), Degenerative joint disease of ankle, left (12/16/2010), Diabetes mellitus without complication (HCC), Essential hypertension (11/13/2006), GERD (gastroesophageal reflux disease), History of hiatal hernia, Hyperlipidemia (02/25/2011), Hypertension, Morbid obesity (HCC), Sleep apnea, and Uterine prolapse.  Diabetes (HCC) Lab Results  Component Value Date   HGBA1C 5.1 03/23/2023   Stable, pt to continue current medical treatment actos 30 every day, and increased mounjaro 7.5 mg weekly; refer DM education   HTN (hypertension) BP Readings from Last 3 Encounters:  03/23/23 132/78  01/04/23 100/72  09/21/22 122/78   Stable, pt to continue medical treatment lotensin 20 every day, hct 12.5 qd   Hyperlipidemia Lab Results  Component Value Date   LDLCALC 87 03/23/2023   uncontrolled, pt to continue current statin lipitor 10 mg , declines change for now   Gait disorder Mild worsening, for PT referral  Followup: Return in about 6 months (around 09/20/2023).  Oliver Barre, MD 03/25/2023 9:10 PM [facility] - [facility] Internal Medicine

## 2023-03-24 DIAGNOSIS — E119 Type 2 diabetes mellitus without complications: Secondary | ICD-10-CM | POA: Diagnosis not present

## 2023-03-25 ENCOUNTER — Encounter: Payer: Self-pay | Admitting: Internal Medicine

## 2023-03-25 NOTE — Assessment & Plan Note (Signed)
 Lab Results  Component Value Date   LDLCALC 87 03/23/2023   uncontrolled, pt to continue current statin lipitor 10 mg , declines change for now

## 2023-03-25 NOTE — Assessment & Plan Note (Signed)
 Mild worsening, for PT referral

## 2023-03-25 NOTE — Assessment & Plan Note (Signed)
 BP Readings from Last 3 Encounters:  03/23/23 132/78  01/04/23 100/72  09/21/22 122/78   Stable, pt to continue medical treatment lotensin 20 every day, hct 12.5 qd

## 2023-03-25 NOTE — Assessment & Plan Note (Addendum)
 Lab Results  Component Value Date   HGBA1C 5.1 03/23/2023   Stable, pt to continue current medical treatment actos 30 every day, and increased mounjaro 7.5 mg weekly; refer DM education

## 2023-03-26 ENCOUNTER — Encounter: Payer: Self-pay | Admitting: Internal Medicine

## 2023-03-26 MED ORDER — NYSTATIN 100000 UNIT/GM EX POWD: 1.0000 | Freq: Every day | CUTANEOUS | 5 refills | Status: AC | PRN

## 2023-03-28 NOTE — Therapy (Signed)
 OUTPATIENT PHYSICAL THERAPY NEURO EVALUATION   Patient Name: Jodi Keller MRN: [id number] DOB:[birth year], 68 y.o., female Today's Date: 03/29/2023   PCP:    Corwin Levins, MD   REFERRING PROVIDER:    Corwin Levins, MD    END OF SESSION:  PT End of Session - 03/29/23 1523     Visit Number 1    Number of Visits 13    Date for PT Re-Evaluation 05/10/23    Authorization Type BCBS Medicare    PT Start Time 1445    PT Stop Time 1521    PT Time Calculation (min) 36 min    Activity Tolerance Patient tolerated treatment well    Behavior During Therapy [facility] for tasks assessed/performed             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 Formatting of this note might be different from the original. Qualifier: Diagnosis of By: Briscoe Burns CMA, Alvy Beal Last Assessment & Plan: Formatting of this note might be different from the original. stable overall by history and exam, recent data reviewed with pt, and pt to continue medical treatment as before,  to f/u any worsening sympto   GERD (gastroesophageal reflux disease)    History of hiatal hernia    Hyperlipidemia 02/25/2011   Hypertension    Morbid obesity (HCC)    Sleep apnea    wears cpap   Uterine prolapse    Past Surgical History:  Procedure Laterality Date   ANKLE FUSION Right 2022   BALLOON DILATION N/A 12/29/2019   Procedure: BALLOON DILATION;  Surgeon: Hilarie Fredrickson, MD;  [facility];  Service: Endoscopy;  Laterality: N/A;   CESAREAN SECTION     COLONOSCOPY WITH PROPOFOL N/A 12/29/2019   Procedure: COLONOSCOPY WITH PROPOFOL;  Surgeon: Hilarie Fredrickson, MD;  [facility];  Service: Endoscopy;  Laterality: N/A;    ESOPHAGOGASTRODUODENOSCOPY (EGD) WITH PROPOFOL N/A 12/29/2019   Procedure: ESOPHAGOGASTRODUODENOSCOPY (EGD) WITH PROPOFOL;  Surgeon: Hilarie Fredrickson, MD;  [facility];  Service: Endoscopy;  Laterality: N/A;   EYE SURGERY     cataract surgery per left eye    FOOT SURGERY     HYSTEROSCOPY WITH D & C N/A 05/04/2022   Procedure: DILATATION AND CURETTAGE /HYSTEROSCOPY;  Surgeon: Silverio Lay, MD;  Location:  [place];  Service: Gynecology;  Laterality: N/A;   INTRAUTERINE DEVICE (IUD) INSERTION N/A 05/04/2022   Procedure: INTRAUTERINE DEVICE (IUD) INSERTION;  Surgeon: Silverio Lay, MD;  Location[facility];  Service: Gynecology;  Laterality: N/A;   IUD REMOVAL N/A 05/04/2022   Procedure: INTRAUTERINE DEVICE (IUD) REMOVAL;  Surgeon: Silverio Lay, MD;  Location: [facility] ;  Service: Gynecology;  Laterality: N/A;   POLYPECTOMY N/A 05/04/2022   Procedure: POLYPECTOMY;  Surgeon: Silverio Lay, MD;  Location: [facility];  Service: Gynecology;  Laterality: N/A;   REVERSE SHOULDER ARTHROPLASTY Right 02/10/2022   Procedure: REVERSE SHOULDER ARTHROPLASTY;  Surgeon: Beverely Low, MD;  [facility];  Service: Orthopedics;  Laterality: Right;  120 min choice and interscalene block   Patient Active Problem List   Diagnosis Date Noted   Paresthesias 09/23/2022   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   Pain  in right hand 05/16/2022   Impairment of balance 04/20/2022   HTN (hypertension) 03/25/2022   Seborrheic keratoses 03/21/2022   S/P shoulder replacement, right 02/10/2022   Preop exam for internal medicine 10/25/2021   Rotator cuff arthropathy of right shoulder 10/19/2021   Encounter for well adult exam with abnormal findings 07/21/2021   Polycythemia vera (HCC) 06/15/2021   Impingement syndrome of left shoulder region 12/23/2020   CKD (chronic kidney disease) stage 3, GFR 30-59 ml/min (HCC) 12/14/2020   Herpes simplex type 2  infection 05/12/2020   Midline cystocele 05/12/2020   Uterine prolapse 05/12/2020   At risk for sleep apnea 04/05/2020   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   Type 2 diabetes mellitus without complication, without long-term current use of insulin (HCC) 03/29/2020   Soft tissue mass 09/26/2018   Avascular necrosis of talus, left (HCC) 03/22/2016   Left rotator cuff tear 09/17/2015   Gait disorder 11/17/2014   Intertrigo 11/17/2014   Polycythemia 12/20/2011   Diabetes (HCC) 07/21/2011   Hyperlipidemia 02/25/2011   Degenerative joint disease of ankle, 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   Degenerative joint disease of ankle and foot 12/16/2010   Colon cancer screening 12/10/2010   Morbid obesity (HCC) 11/13/2006    ONSET DATE: 2022  REFERRING DIAG: R26.9 (ICD-10-CM) - Gait disorder  THERAPY DIAG:  Unsteadiness on feet  Other abnormalities of gait and mobility  Muscle weakness (generalized)  Rationale for Evaluation and Treatment: Rehabilitation  SUBJECTIVE:                                                                                                                                                                                             SUBJECTIVE STATEMENT: Pt reports that she had a R ankle fusion at [facility], however also having L ankle issues- feeling unstable on this ankle. Reports that she does not feel comfortable on uneven ground. Generally using 4WW out in the community and occasionally inside to assist in carrying items. D/t ankle not bending d/t her fusion, having some issues with getting up from a chair and getting up stairs. Reports difficulty on descending inclines.    Pt accompanied by: self  PERTINENT HISTORY: Anemia, DM, HTN, GERD, hiatal hernia, HLD, R ankle fusion 2022, R reverse TSA 2024  PAIN:  Are you having pain? No  PRECAUTIONS: Fall  RED FLAGS: None   WEIGHT BEARING RESTRICTIONS: No  FALLS: Has patient fallen in last 6 months?  No  LIVING ENVIRONMENT: Lives with: lives alone Lives in: House/apartment Stairs:  no steps to enter 1 story apartment Has following equipment at home: Single point cane, Environmental consultant -  2 wheeled, Environmental consultant - 4 wheeled, and Grab bars  PLOF: Independent; currently unemployed- looking for an office job; water fitness class 3x/week  PATIENT GOALS: improve balance   OBJECTIVE:  Note: Objective measures were completed at Evaluation unless otherwise noted.  DIAGNOSTIC FINDINGS: none recent   COGNITION: Overall cognitive status: Within functional limits for tasks assessed   SENSATION: Pt reports some diminished sensation in the R foot s/p surgery   POSTURE: rounded shoulders  LOWER EXTREMITY ROM:     Active  Right Eval Left Eval  Hip flexion    Hip extension    Hip abduction    Hip adduction    Hip internal rotation    Hip external rotation    Knee flexion    Knee extension    Ankle dorsiflexion 8 9  Ankle plantarflexion 0 30  Ankle inversion [facility] WFL  Ankle eversion WFL WFL   (Blank rows = not tested)  LOWER EXTREMITY MMT:    MMT (in sitting) Right Eval Left Eval  Hip flexion 4+ 4+  Hip extension    Hip abduction 4+ 4+  Hip adduction 4+ 4+  Hip internal rotation    Hip external rotation    Knee flexion 4+ 4  Knee extension 4 4  Ankle dorsiflexion 4- 4  Ankle plantarflexion 4 4  Ankle inversion 4 4  Ankle eversion 4- 4  (Blank rows = not tested)    GAIT: Gait pattern: forward trunk lean on walker, gait appears rushed d/t multiple quick but small steps; gait attempted without 4WW were not safe d/t imbalance  Assistive device utilized: Walker - 4 wheeled Level of assistance: Modified independence  FUNCTIONAL TESTS:    [facility] PT Assessment - 03/29/23 0001       Standardized Balance Assessment   Standardized Balance Assessment 10 meter walk test;Five Times Sit to Stand;Dynamic Gait Index    Five times sit to stand comments  11.28 sec with B UE support    10  Meter Walk 11.92 sec  with 4WW (2.75 ft/sec)               M-CTSIB  Condition 1: Firm Surface, EO 30 Sec, Normal Sway  Condition 2: Firm Surface, EC 6 Sec, Moderate and Severe Sway  Condition 3: Foam Surface, EO 12 Sec, Moderate and Severe Sway  Condition 4: Foam Surface, EC NT Sec,  NT  Sway                                                                                                                                 TREATMENT DATE: 03/29/23    PATIENT EDUCATION: Education details: exam findings, prognosis, POC, HEP with edu for safety Person educated: Patient Education method: Explanation, Demonstration, Tactile cues, Verbal cues, and Handouts Education comprehension: verbalized understanding and returned demonstration  HOME EXERCISE PROGRAM: Access Code: [email or web address] URL: [email or web address] Date: 03/29/2023 Prepared by: [facility] - Outpatient  Rehab - [facility]  Program Notes perform at counter or corner for safety  Exercises - Standing Balance in Corner with Eyes Closed  - 1 x daily - 5 x weekly - 2 sets - 30 sec hold - Corner Balance Feet Together With Eyes Open  - 1 x daily - 5 x weekly - 2 sets - 30 sec hold - Standing Gastroc Stretch at Counter  - 1 x daily - 5 x weekly - 2 sets - 30 sec hold - Sit to Stand with Armchair  - 1 x daily - 5 x weekly - 2 sets - 10 reps  GOALS: Goals reviewed with patient? Yes  SHORT TERM GOALS: Target date: 04/19/2023  Patient to be independent with initial HEP. Baseline: HEP initiated Goal status: INITIAL    LONG TERM GOALS: Target date: 05/10/2023  Patient to be independent with advanced HEP. Baseline: Not yet initiated  Goal status: INITIAL  Patient to demonstrate B LE strength >/=4+/5.  Baseline: See above Goal status: INITIAL  Patient to maintain stability on M-CTSIB condition # 2 and 3 for 30 sec without LOB to improve safety in more challenging environments.  Baseline: 6 sec, 12 sec  respectively  Goal status: INITIAL  Patient to score at least 46/56 on Berg in order to decrease risk of falls.  Baseline: NT Goal status: INITIAL  Patient to demonstrate 5xSTS test in <15 sec without UE use in order to decrease risk of falls.  Baseline: 11 sec with B UE support  Goal status: INITIAL  Patient to navigate steps with alternating reciprocal pattern with good stability.  Baseline: NT Goal status: INITIAL  ASSESSMENT:  CLINICAL IMPRESSION:  Patient is a 68 y/o F presenting to [facility] with c/o imbalance s/p R ankle fusion in 2022. Patient expresses fear of falling, difficulty with transfers, stairs, and descending inclines. Patient today presenting with limited ankle ROM, B knee and ankle weakness, gait deviations, decreased gait speed, and imbalance with EC and on compliant surfaces. Patient was educated on gentle strengthening and balance HEP and reported understanding. Would benefit from skilled PT services 1-2 x/week for 6 weeks to address aforementioned impairments in order to optimize level of function.    OBJECTIVE IMPAIRMENTS: Abnormal gait, decreased activity tolerance, decreased balance, difficulty walking, decreased ROM, decreased strength, impaired flexibility, and postural dysfunction.   ACTIVITY LIMITATIONS: carrying, lifting, bending, sitting, standing, squatting, stairs, transfers, bed mobility, bathing, toileting, dressing, hygiene/grooming, and locomotion level  PARTICIPATION LIMITATIONS: meal prep, cleaning, laundry, driving, shopping, community activity, and church  PERSONAL FACTORS: Age, Past/current experiences, Time since onset of injury/illness/exacerbation, and 3+ comorbidities: Anemia, DM, HTN, GERD, hiatal hernia, HLD, R ankle fusion 2022, R reverse TSA 2024  are also affecting patient's functional outcome.   REHAB POTENTIAL: Good  CLINICAL DECISION MAKING: Evolving/moderate complexity  EVALUATION COMPLEXITY: Moderate  PLAN:  PT FREQUENCY:  1-2x/week  PT DURATION: 6 weeks  PLANNED INTERVENTIONS: [id number]- PT Re-evaluation, 97110-Therapeutic exercises, 97530- Therapeutic activity, 97112- Neuromuscular re-education, 97535- Self Care, 96045- Manual therapy, [phone]- Gait training, [phone]- Canalith repositioning, [id number]- Electrical stimulation (unattended), [phone]- Electrical stimulation (manual), Patient/Family education, Balance training, Stair training, Taping, Dry Needling, Joint mobilization, Spinal mobilization, Vestibular training, DME instructions, Cryotherapy, and Moist heat  PLAN FOR NEXT SESSION: Berg; review HEP; ankle strength, quad strength, balance on uneven surfaces, improving balance confidence      Baldemar Friday, PT, DPT 03/29/23 3:40 PM  [facility] [address] [place], [address] [place], [place] [address] Phone # [phone] Fax # [phone]

## 2023-03-29 ENCOUNTER — Ambulatory Visit: Attending: Internal Medicine | Admitting: Physical Therapy

## 2023-03-29 ENCOUNTER — Other Ambulatory Visit: Payer: Self-pay

## 2023-03-29 ENCOUNTER — Encounter: Payer: Self-pay | Admitting: Physical Therapy

## 2023-03-29 ENCOUNTER — Encounter: Payer: Self-pay | Admitting: Dietician

## 2023-03-29 ENCOUNTER — Encounter: Attending: Internal Medicine | Admitting: Dietician

## 2023-03-29 DIAGNOSIS — R2689 Other abnormalities of gait and mobility: Secondary | ICD-10-CM | POA: Insufficient documentation

## 2023-03-29 DIAGNOSIS — R269 Unspecified abnormalities of gait and mobility: Secondary | ICD-10-CM | POA: Diagnosis not present

## 2023-03-29 DIAGNOSIS — M25572 Pain in left ankle and joints of left foot: Secondary | ICD-10-CM | POA: Insufficient documentation

## 2023-03-29 DIAGNOSIS — E119 Type 2 diabetes mellitus without complications: Secondary | ICD-10-CM | POA: Diagnosis not present

## 2023-03-29 DIAGNOSIS — M6281 Muscle weakness (generalized): Secondary | ICD-10-CM | POA: Insufficient documentation

## 2023-03-29 DIAGNOSIS — M25571 Pain in right ankle and joints of right foot: Secondary | ICD-10-CM | POA: Diagnosis not present

## 2023-03-29 DIAGNOSIS — R2681 Unsteadiness on feet: Secondary | ICD-10-CM | POA: Diagnosis not present

## 2023-03-29 NOTE — Progress Notes (Signed)
 Diabetes Self-Management Education  Visit Type: First/Initial  Appt. Start Time: 1040 late Appt. End Time: 1140  04/06/2023  Jodi Keller, identified by name and date of birth, is a 68 y.o. female with a diagnosis of Diabetes: Type 2.   ASSESSMENT Patient is here today alone. She states that she has taste alterations and is never thirsty since starting the [facility]. She would like to learn more about diabetes and checking her blood glucose. Taught patient how to use the [employer] blood glucose meter.  Fasting blood glucose was 89.   Referral:  Type 2 Diabetes  History includes:  Type 2 Diabetes, GERD, OSA - on c-pap, HTN, HLD Medication includes:  Mounjaro, MVI, calcium, Vitamin C, Vitamin D Labs noted to include:  A1C 5.1% on 03/23/2023 decreased form 8.3% 01/30/2022, BUN 22, Creatinine 1.11, Potassium 4.2, GFR 51 03/23/2023, vitamin D  82 03/23/2023, Vitamin B-12 4158/29/2024  Weight hx: 67" 319 lbs 03/23/2023  Patient lives alone. She does her own shopping and cooking.  She has adult children. Leafy greens are causing loose stools currently. She is retired.  She used to work in [employer] in [employer]. She uses a walker for safety as she had an ankle and shoulder surgery. Water aerobics 2-3 times per week.   Diabetes Self-Management Education - 03/29/23 1057       Visit Information   Visit Type First/Initial      Initial Visit   Diabetes Type Type 2    Date Diagnosed 01/2022    Are you currently following a meal plan? Yes    What type of meal plan do you follow? none    Are you taking your medications as prescribed? Yes      Health Coping   How would you rate your overall health? Good      Psychosocial Assessment   Patient Belief/Attitude about Diabetes Motivated to manage diabetes    What is the hardest part about your diabetes right now, causing you the most concern, or is the most worrisome to you about your diabetes?   Making healty food and beverage  choices;Being active    Self-care barriers None    Self-management support Doctor's office    Other persons present Patient    Patient Concerns Nutrition/Meal planning;Monitoring;Healthy Lifestyle;Weight Control    Special Needs None    Preferred Learning Style No preference indicated    Learning Readiness Ready    How often do you need to have someone help you when you read instructions, pamphlets, or other written materials from your doctor or pharmacy? 1 - Never    What is the last grade level you completed in school? college grad      Pre-Education Assessment   Patient understands the diabetes disease and treatment process. Needs Instruction    Patient understands incorporating nutritional management into lifestyle. Needs Instruction    Patient undertands incorporating physical activity into lifestyle. Needs Instruction    Patient understands using medications safely. Needs Instruction    Patient understands monitoring blood glucose, interpreting and using results Needs Instruction    Patient understands prevention, detection, and treatment of acute complications. Needs Instruction    Patient understands prevention, detection, and treatment of chronic complications. Needs Instruction    Patient understands how to develop strategies to address psychosocial issues. Needs Instruction    Patient understands how to develop strategies to promote health/change behavior. Needs Instruction      Complications   Last HgB A1C per patient/outside source  5.1 %   03/23/2023 increased from 8.3% 01/30/2022   How often do you check your blood sugar? 0 times/day (not testing)    Number of hypoglycemic episodes per month 1    Have you had a dilated eye exam in the past 12 months? Yes    Have you had a dental exam in the past 12 months? Yes    Are you checking your feet? No      Dietary Intake   Breakfast muffin and coffee with sweetened creamer    Snack (morning) none    Lunch shrimp, potato and corn  chowder    Snack (afternoon) occasional pretzels and mixed nuts    Dinner collards, shrimp fettucini OR spinach and artichoke pasta OR [place] hotdog on a bun    Snack (evening) occasional yogurt, 1/2 flourless chocolate cake last night OR pretzels    Beverage(s) water, coffee with sweetened creamer, zero sugar tea with diet lemonade, coke zero, seltzer water      Activity / Exercise   Activity / Exercise Type Moderate (swimming / aerobic walking)    How many days per week do you exercise? 3    How many minutes per day do you exercise? 60    Total minutes per week of exercise 180      Patient Education   Previous Diabetes Education No    Disease Pathophysiology Definition of diabetes, type 1 and 2, and the diagnosis of diabetes    Healthy Eating Role of diet in the treatment of diabetes and the relationship between the three main macronutrients and blood glucose level;Food label reading, portion sizes and measuring food.;Plate Method;Meal options for control of blood glucose level and chronic complications.    Being Active Role of exercise on diabetes management, blood pressure control and cardiac health.    Medications Reviewed patients medication for diabetes, action, purpose, timing of dose and side effects.    Monitoring Identified appropriate SMBG and/or A1C goals.;Daily foot exams;Yearly dilated eye exam    Acute complications Taught prevention, symptoms, and  treatment of hypoglycemia - the 15 rule.    Chronic complications Relationship between chronic complications and blood glucose control    Diabetes Stress and Support Identified and addressed patients feelings and concerns about diabetes;Worked with patient to identify barriers to care and solutions;Role of stress on diabetes      Individualized Goals (developed by patient)   Nutrition General guidelines for healthy choices and portions discussed    Physical Activity Exercise 3-5 times per week;60 minutes per day    Medications  take my medication as prescribed    Monitoring  Test my blood glucose as discussed    Problem Solving Addressing barriers to behavior change    Reducing Risk examine blood glucose patterns;do foot checks daily;treat hypoglycemia with 15 grams of carbs if blood glucose less than 70mg /dL      Post-Education Assessment   Patient understands the diabetes disease and treatment process. Demonstrates understanding / competency    Patient understands incorporating nutritional management into lifestyle. Demonstrates understanding / competency    Patient undertands incorporating physical activity into lifestyle. Demonstrates understanding / competency    Patient understands using medications safely. Demonstrates understanding / competency    Patient understands monitoring blood glucose, interpreting and using results Demonstrates understanding / competency    Patient understands prevention, detection, and treatment of acute complications. Demonstrates understanding / competency    Patient understands prevention, detection, and treatment of chronic complications. Demonstrates understanding / competency  Patient understands how to develop strategies to address psychosocial issues. Demonstrates understanding / competency    Patient understands how to develop strategies to promote health/change behavior. Demonstrates understanding / competency      Outcomes   Expected Outcomes Demonstrated interest in learning. Expect positive outcomes    Future DMSE PRN    Program Status Completed             Individualized Plan for Diabetes Self-Management Training:   Learning Objective:  Patient will have a greater understanding of diabetes self-management. Patient education plan is to attend individual and/or group sessions per assessed needs and concerns.   Plan:   Patient Instructions  Plan:  Aim for 2-3 Carb Choices per meal (30-45 grams) +/- 1 either way  Aim for 0-1 Carbs per snack if hungry   Include protein in moderation with your meals and snacks Consider reading food labels for Total Carbohydrate of foods Consider  increasing your activity level by swimming and armchair exercises for 30 or more minutes daily as tolerated Consider checking BG at alternate times per day  Consider taking medication as directed by MD    Expected Outcomes:  Demonstrated interest in learning. Expect positive outcomes  Education material provided: ADA - How to Thrive: A Guide for Your Journey with Diabetes, Food label handouts, Meal plan card, Snack sheet, and Diabetes Resources  If problems or questions, patient to contact team via:  Phone  Future DSME appointment: PRN

## 2023-03-29 NOTE — Patient Instructions (Signed)
 Plan:  Aim for 2-3 Carb Choices per meal (30-45 grams) +/- 1 either way  Aim for 0-1 Carbs per snack if hungry  Include protein in moderation with your meals and snacks Consider reading food labels for Total Carbohydrate of foods Consider  increasing your activity level by swimming and armchair exercises for 30 or more minutes daily as tolerated Consider checking BG at alternate times per day  Consider taking medication as directed by MD

## 2023-04-02 ENCOUNTER — Ambulatory Visit

## 2023-04-02 DIAGNOSIS — M25571 Pain in right ankle and joints of right foot: Secondary | ICD-10-CM | POA: Diagnosis not present

## 2023-04-02 DIAGNOSIS — R2689 Other abnormalities of gait and mobility: Secondary | ICD-10-CM

## 2023-04-02 DIAGNOSIS — M6281 Muscle weakness (generalized): Secondary | ICD-10-CM

## 2023-04-02 DIAGNOSIS — R2681 Unsteadiness on feet: Secondary | ICD-10-CM | POA: Diagnosis not present

## 2023-04-02 DIAGNOSIS — R269 Unspecified abnormalities of gait and mobility: Secondary | ICD-10-CM | POA: Diagnosis not present

## 2023-04-02 DIAGNOSIS — M25572 Pain in left ankle and joints of left foot: Secondary | ICD-10-CM | POA: Diagnosis not present

## 2023-04-02 NOTE — Therapy (Signed)
 OUTPATIENT PHYSICAL THERAPY NEURO TREATMENT   Patient Name: Jodi Keller MRN: [id number] DOB:1955-02-19, 68 y.o., female Today's Date: 04/02/2023   PCP:    Corwin Levins, MD   REFERRING PROVIDER:    Corwin Levins, MD    END OF SESSION:  PT End of Session - 04/02/23 1621     Visit Number 2    Number of Visits 13    Date for PT Re-Evaluation 05/10/23    Authorization Type BCBS Medicare    PT Start Time 1620    PT Stop Time 1700    PT Time Calculation (min) 40 min    Activity Tolerance Patient tolerated treatment well    Behavior During Therapy [facility] for tasks assessed/performed             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 Formatting of this note might be different from the original. Qualifier: Diagnosis of By: Briscoe Burns CMA, Alvy Beal Last Assessment & Plan: Formatting of this note might be different from the original. stable overall by history and exam, recent data reviewed with pt, and pt to continue medical treatment as before,  to f/u any worsening sympto   GERD (gastroesophageal reflux disease)    History of hiatal hernia    Hyperlipidemia 02/25/2011   Hypertension    Morbid obesity (HCC)    Sleep apnea    wears cpap   Uterine prolapse    Past Surgical History:  Procedure Laterality Date   ANKLE FUSION Right 2022   BALLOON DILATION N/A 12/29/2019   Procedure: BALLOON DILATION;  Surgeon: Hilarie Fredrickson, MD;  Location: [facility];  Service: Endoscopy;  Laterality: N/A;   CESAREAN SECTION     COLONOSCOPY WITH PROPOFOL N/A 12/29/2019   Procedure: COLONOSCOPY WITH PROPOFOL;  Surgeon: Hilarie Fredrickson, MD;  Location: [facility];  Service: Endoscopy;  Laterality: N/A;    ESOPHAGOGASTRODUODENOSCOPY (EGD) WITH PROPOFOL N/A 12/29/2019   Procedure: ESOPHAGOGASTRODUODENOSCOPY (EGD) WITH PROPOFOL;  Surgeon: Hilarie Fredrickson, MD;  Location: [facility];  Service: Endoscopy;  Laterality: N/A;   EYE SURGERY     cataract surgery per left eye    FOOT SURGERY     HYSTEROSCOPY WITH D & C N/A 05/04/2022   Procedure: DILATATION AND CURETTAGE /HYSTEROSCOPY;  Surgeon: Silverio Lay, MD;  Location: [facility];  Service: Gynecology;  Laterality: N/A;   INTRAUTERINE DEVICE (IUD) INSERTION N/A 05/04/2022   Procedure: INTRAUTERINE DEVICE (IUD) INSERTION;  Surgeon: Silverio Lay, MD;  Location: [facility];  Service: Gynecology;  Laterality: N/A;   IUD REMOVAL N/A 05/04/2022   Procedure: INTRAUTERINE DEVICE (IUD) REMOVAL;  Surgeon: Silverio Lay, MD;  Location: [facility];  Service: Gynecology;  Laterality: N/A;   POLYPECTOMY N/A 05/04/2022   Procedure: POLYPECTOMY;  Surgeon: Silverio Lay, MD;  Location: [facility];  Service: Gynecology;  Laterality: N/A;   REVERSE SHOULDER ARTHROPLASTY Right 02/10/2022   Procedure: REVERSE SHOULDER ARTHROPLASTY;  Surgeon: Beverely Low, MD;  Location: [facility];  Service: Orthopedics;  Laterality: Right;  120 min choice and interscalene block   Patient Active Problem List   Diagnosis Date Noted   Paresthesias 09/23/2022   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   Pain  in right hand 05/16/2022   Impairment of balance 04/20/2022   HTN (hypertension) 03/25/2022   Seborrheic keratoses 03/21/2022   S/P shoulder replacement, right 02/10/2022   Preop exam for internal medicine 10/25/2021   Rotator cuff arthropathy of right shoulder 10/19/2021   Encounter for well adult exam with abnormal findings 07/21/2021   Polycythemia vera (HCC) 06/15/2021   Impingement syndrome of left shoulder region 12/23/2020   CKD (chronic kidney disease) stage 3, GFR 30-59 ml/min (HCC) 12/14/2020   Herpes simplex type 2  infection 05/12/2020   Midline cystocele 05/12/2020   Uterine prolapse 05/12/2020   At risk for sleep apnea 04/05/2020   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   Type 2 diabetes mellitus without complication, without long-term current use of insulin (HCC) 03/29/2020   Soft tissue mass 09/26/2018   Avascular necrosis of talus, left (HCC) 03/22/2016   Left rotator cuff tear 09/17/2015   Gait disorder 11/17/2014   Intertrigo 11/17/2014   Polycythemia 12/20/2011   Diabetes (HCC) 07/21/2011   Hyperlipidemia 02/25/2011   Degenerative joint disease of ankle, 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  Rationale for Evaluation and Treatment: Rehabilitation  SUBJECTIVE:                                                                                                                                                                                             SUBJECTIVE STATEMENT: Doing ok, no new issues. Did not try the HEP yet   Pt accompanied by: self  PERTINENT HISTORY: Anemia, DM, HTN, GERD, hiatal hernia, HLD, R ankle fusion 2022, R reverse TSA 2024  PAIN:  Are you having pain? No  PRECAUTIONS: Fall  RED FLAGS: None   WEIGHT BEARING RESTRICTIONS: No  FALLS: Has patient fallen in last 6 months? No  LIVING ENVIRONMENT: Lives with: lives alone Lives in: House/apartment Stairs:  no steps to enter 1 story apartment Has following equipment at home: Single point cane, Environmental consultant - 2 wheeled, Environmental consultant - 4 wheeled, and Grab bars  PLOF: Independent; currently unemployed- looking for an office job; water fitness class 3x/week  PATIENT GOALS: improve balance   OBJECTIVE:   TODAY'S TREATMENT: 04/02/23 Activity Comments  Discussion regarding heel lift for  decreasing ROM   HEP review  Cues for all except sit to stand  [employer] 42/56  Sidestepping x 60 sec Cues for LE position  Seated towel scrunches 1x10   Lateral slide out 1x10 BUE support, for hip/knee/ankle eccentric control     [facility] PT Assessment - 04/02/23 0001       Standardized Balance Assessment   Standardized Balance Assessment Berg Balance Test      Berg Balance Test   Sit to Stand Able to stand  independently using hands    Standing Unsupported Able to stand safely 2 minutes    Sitting with Back Unsupported but Feet Supported on Floor or Stool Able to sit safely and securely 2 minutes    Stand to Sit Sits safely with minimal use of hands    Transfers Able to transfer safely, minor use of hands    Standing Unsupported with Eyes Closed Able to stand 10 seconds safely    Standing Unsupported with Feet Together Able to place feet together independently and stand 1 minute safely    From Standing, Reach Forward with Outstretched Arm Can reach forward >12 cm safely (5")    From Standing Position, Pick up Object from Floor Able to pick up shoe, needs supervision    From Standing Position, Turn to Look Behind Over each Shoulder Looks behind one side only/other side shows less weight shift    Turn 360 Degrees Able to turn 360 degrees safely but slowly    Standing Unsupported, Alternately Place Feet on Step/Stool Able to complete >2 steps/needs minimal assist    Standing Unsupported, One Foot in Front Able to take small step independently and hold 30 seconds    Standing on One Leg Tries to lift leg/unable to hold 3 seconds but remains standing independently    Total Score 42              PATIENT EDUCATION: Education details: use of heel lift to accommodate lack of ankle DF/PF. HEP updates Person educated: Patient Education method: Explanation, Demonstration, Tactile cues, Verbal cues, and Handouts Education comprehension: verbalized understanding and returned  demonstration  HOME EXERCISE PROGRAM: Access Code: [email or web address] URL: [email or web address] Date: 03/29/2023 Prepared by: [facility] - Outpatient  Rehab - [facility]  Program Notes perform at counter or corner for safety  Exercises - Standing Balance in Corner with Eyes Closed  - 1 x daily - 5 x weekly - 2 sets - 30 sec hold - Corner Balance Feet Together With Eyes Open  - 1 x daily - 5 x weekly - 2 sets - 30 sec hold - Standing Gastroc Stretch at Counter  - 1 x daily - 5 x weekly - 2 sets - 30 sec hold - Sit to Stand with Armchair  - 1 x daily - 5 x weekly - 2 sets - 10 reps - Side Stepping with Counter Support  - 1 x daily - 7 x weekly - 1-3 sets - 1-2 min rounds hold - Seated Toe Towel Scrunches  - 1 x daily - 7 x weekly - 1-3 sets - 10 reps  Note: Objective measures were completed at Evaluation unless otherwise noted.  DIAGNOSTIC FINDINGS: none recent   COGNITION: Overall cognitive status: Within functional limits for tasks assessed   SENSATION: Pt reports some diminished sensation in the R foot s/p surgery   POSTURE: rounded shoulders  LOWER EXTREMITY ROM:     Active  Right Eval Left Eval  Hip flexion    Hip extension    Hip abduction    Hip adduction    Hip internal rotation    Hip external rotation    Knee  flexion    Knee extension    Ankle dorsiflexion 8 9  Ankle plantarflexion 0 30  Ankle inversion [facility]  Ankle eversion WFL WFL   (Blank rows = not tested)  LOWER EXTREMITY MMT:    MMT (in sitting) Right Eval Left Eval  Hip flexion 4+ 4+  Hip extension    Hip abduction 4+ 4+  Hip adduction 4+ 4+  Hip internal rotation    Hip external rotation    Knee flexion 4+ 4  Knee extension 4 4  Ankle dorsiflexion 4- 4  Ankle plantarflexion 4 4  Ankle inversion 4 4  Ankle eversion 4- 4  (Blank rows = not tested)    GAIT: Gait pattern: forward trunk lean on walker, gait appears rushed d/t multiple quick but small steps; gait attempted  without 4WW were not safe d/t imbalance  Assistive device utilized: Walker - 4 wheeled Level of assistance: Modified independence  FUNCTIONAL TESTS:        M-CTSIB  Condition 1: Firm Surface, EO 30 Sec, Normal Sway  Condition 2: Firm Surface, EC 6 Sec, Moderate and Severe Sway  Condition 3: Foam Surface, EO 12 Sec, Moderate and Severe Sway  Condition 4: Foam Surface, EC NT Sec,  NT  Sway                                                                                                                                 TREATMENT DATE: 03/29/23     GOALS: Goals reviewed with patient? Yes  SHORT TERM GOALS: Target date: 04/19/2023  Patient to be independent with initial HEP. Baseline: HEP initiated Goal status: INITIAL    LONG TERM GOALS: Target date: 05/10/2023  Patient to be independent with advanced HEP. Baseline: Not yet initiated  Goal status: INITIAL  Patient to demonstrate B LE strength >/=4+/5.  Baseline: See above Goal status: INITIAL  Patient to maintain stability on M-CTSIB condition # 2 and 3 for 30 sec without LOB to improve safety in more challenging environments.  Baseline: 6 sec, 12 sec respectively  Goal status: INITIAL  Patient to score at least 46/56 on Berg in order to decrease risk of falls.  Baseline: 42/56 Goal status: INITIAL  Patient to demonstrate 5xSTS test in <15 sec without UE use in order to decrease risk of falls.  Baseline: 11 sec with B UE support  Goal status: INITIAL  Patient to navigate steps with alternating reciprocal pattern with good stability.  Baseline: NT Goal status: INITIAL  ASSESSMENT:  CLINICAL IMPRESSION: Pt returns to clinic and reports limited performance for HEP. Berg Balance Test performed and reviewed with score of 42/56 indicating increased risk for falls with greatest deficits being tasks which enforce single limb support and/or narrow BOS.  Review of HEP to reinforce initial skill set with addition of  activities to promote increased LE strength and single limb support. Continued sessions to progress POC details to improve mobility  and reduce risk for falls.    OBJECTIVE IMPAIRMENTS: Abnormal gait, decreased activity tolerance, decreased balance, difficulty walking, decreased ROM, decreased strength, impaired flexibility, and postural dysfunction.   ACTIVITY LIMITATIONS: carrying, lifting, bending, sitting, standing, squatting, stairs, transfers, bed mobility, bathing, toileting, dressing, hygiene/grooming, and locomotion level  PARTICIPATION LIMITATIONS: meal prep, cleaning, laundry, driving, shopping, community activity, and church  PERSONAL FACTORS: Age, Past/current experiences, Time since onset of injury/illness/exacerbation, and 3+ comorbidities: Anemia, DM, HTN, GERD, hiatal hernia, HLD, R ankle fusion 2022, R reverse TSA 2024  are also affecting patient's functional outcome.   REHAB POTENTIAL: Good  CLINICAL DECISION MAKING: Evolving/moderate complexity  EVALUATION COMPLEXITY: Moderate  PLAN:  PT FREQUENCY: 1-2x/week  PT DURATION: 6 weeks  PLANNED INTERVENTIONS: 97164- PT Re-evaluation, 97110-Therapeutic exercises, 97530- Therapeutic activity, [id number]- Neuromuscular re-education, 97535- Self Care, 16109- Manual therapy, [id number]- Gait training, [phone]- Canalith repositioning, [id number]- Electrical stimulation (unattended), [phone]- Electrical stimulation (manual), Patient/Family education, Balance training, Stair training, Taping, Dry Needling, Joint mobilization, Spinal mobilization, Vestibular training, DME instructions, Cryotherapy, and Moist heat  PLAN FOR NEXT SESSION:  review HEP; ankle strength, quad strength, balance on uneven surfaces, improving balance confidence     5:06 PM, 04/02/23 M. Shary Decamp, PT, DPT Physical Therapist- [place] Office Number: [phone]

## 2023-04-06 ENCOUNTER — Encounter: Payer: Self-pay | Admitting: Physical Therapy

## 2023-04-06 ENCOUNTER — Ambulatory Visit: Admitting: Physical Therapy

## 2023-04-06 DIAGNOSIS — R2689 Other abnormalities of gait and mobility: Secondary | ICD-10-CM | POA: Diagnosis not present

## 2023-04-06 DIAGNOSIS — R2681 Unsteadiness on feet: Secondary | ICD-10-CM

## 2023-04-06 DIAGNOSIS — M25571 Pain in right ankle and joints of right foot: Secondary | ICD-10-CM | POA: Diagnosis not present

## 2023-04-06 DIAGNOSIS — M6281 Muscle weakness (generalized): Secondary | ICD-10-CM | POA: Diagnosis not present

## 2023-04-06 DIAGNOSIS — M25572 Pain in left ankle and joints of left foot: Secondary | ICD-10-CM | POA: Diagnosis not present

## 2023-04-06 DIAGNOSIS — R269 Unspecified abnormalities of gait and mobility: Secondary | ICD-10-CM | POA: Diagnosis not present

## 2023-04-06 NOTE — Therapy (Signed)
 OUTPATIENT PHYSICAL THERAPY NEURO TREATMENT   Patient Name: Jodi Keller MRN: [id number] DOB:[birth year], 68 y.o., female Today's Date: 04/06/2023   PCP:    Corwin Levins, MD   REFERRING PROVIDER:    Corwin Levins, MD    END OF SESSION:  PT End of Session - 04/06/23 1110     Visit Number 3    Number of Visits 13    Date for PT Re-Evaluation 05/10/23    Authorization Type BCBS Medicare    PT Start Time 1108    PT Stop Time 1146    PT Time Calculation (min) 38 min    Activity Tolerance Patient tolerated treatment well    Behavior During Therapy [facility] for tasks assessed/performed              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 Formatting of this note might be different from the original. Qualifier: Diagnosis of By: Briscoe Burns CMA, Alvy Beal Last Assessment & Plan: Formatting of this note might be different from the original. stable overall by history and exam, recent data reviewed with pt, and pt to continue medical treatment as before,  to f/u any worsening sympto   GERD (gastroesophageal reflux disease)    History of hiatal hernia    Hyperlipidemia 02/25/2011   Hypertension    Morbid obesity (HCC)    Sleep apnea    wears cpap   Uterine prolapse    Past Surgical History:  Procedure Laterality Date   ANKLE FUSION Right 2022   BALLOON DILATION N/A 12/29/2019   Procedure: BALLOON DILATION;  Surgeon: Hilarie Fredrickson, MD;  Location: [facility];  Service: Endoscopy;  Laterality: N/A;   CESAREAN SECTION     COLONOSCOPY WITH PROPOFOL N/A 12/29/2019   Procedure: COLONOSCOPY WITH PROPOFOL;  Surgeon: Hilarie Fredrickson, MD;  Location: [facility];  Service: Endoscopy;  Laterality: N/A;    ESOPHAGOGASTRODUODENOSCOPY (EGD) WITH PROPOFOL N/A 12/29/2019   Procedure: ESOPHAGOGASTRODUODENOSCOPY (EGD) WITH PROPOFOL;  Surgeon: Hilarie Fredrickson, MD;  Location: [facility];  Service: Endoscopy;  Laterality: N/A;   EYE SURGERY     cataract surgery per left eye    FOOT SURGERY     HYSTEROSCOPY WITH D & C N/A 05/04/2022   Procedure: DILATATION AND CURETTAGE /HYSTEROSCOPY;  Surgeon: Silverio Lay, MD;  Location: [facility];  Service: Gynecology;  Laterality: N/A;   INTRAUTERINE DEVICE (IUD) INSERTION N/A 05/04/2022   Procedure: INTRAUTERINE DEVICE (IUD) INSERTION;  Surgeon: Silverio Lay, MD;  Location: [facility];  Service: Gynecology;  Laterality: N/A;   IUD REMOVAL N/A 05/04/2022   Procedure: INTRAUTERINE DEVICE (IUD) REMOVAL;  Surgeon: Silverio Lay, MD;  Location: [facility];  Service: Gynecology;  Laterality: N/A;   POLYPECTOMY N/A 05/04/2022   Procedure: POLYPECTOMY;  Surgeon: Silverio Lay, MD;  Location: [facility];  Service: Gynecology;  Laterality: N/A;   REVERSE SHOULDER ARTHROPLASTY Right 02/10/2022   Procedure: REVERSE SHOULDER ARTHROPLASTY;  Surgeon: Beverely Low, MD;  Location: [facility];  Service: Orthopedics;  Laterality: Right;  120 min choice and interscalene block   Patient Active Problem List   Diagnosis Date Noted   Paresthesias 09/23/2022   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  Pain in right hand 05/16/2022   Impairment of balance 04/20/2022   HTN (hypertension) 03/25/2022   Seborrheic keratoses 03/21/2022   S/P shoulder replacement, right 02/10/2022   Preop exam for internal medicine 10/25/2021   Rotator cuff arthropathy of right shoulder 10/19/2021   Encounter for well adult exam with abnormal findings 07/21/2021   Polycythemia vera (HCC) 06/15/2021   Impingement syndrome of left shoulder region 12/23/2020   CKD (chronic kidney disease) stage 3, GFR 30-59 ml/min (HCC) 12/14/2020   Herpes simplex type 2  infection 05/12/2020   Midline cystocele 05/12/2020   Uterine prolapse 05/12/2020   At risk for sleep apnea 04/05/2020   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   Type 2 diabetes mellitus without complication, without long-term current use of insulin (HCC) 03/29/2020   Soft tissue mass 09/26/2018   Avascular necrosis of talus, left (HCC) 03/22/2016   Left rotator cuff tear 09/17/2015   Gait disorder 11/17/2014   Intertrigo 11/17/2014   Polycythemia 12/20/2011   Diabetes (HCC) 07/21/2011   Hyperlipidemia 02/25/2011   Degenerative joint disease of ankle, 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   Degenerative joint disease of ankle and foot 12/16/2010   Colon cancer screening 12/10/2010   Morbid obesity (HCC) 11/13/2006    ONSET DATE: 2022  REFERRING DIAG: R26.9 (ICD-10-CM) - Gait disorder  THERAPY DIAG:  Unsteadiness on feet  Other abnormalities of gait and mobility  Muscle weakness (generalized)  Rationale for Evaluation and Treatment: Rehabilitation  SUBJECTIVE:                                                                                                                                                                                             SUBJECTIVE STATEMENT: Doing the exercises and the ones with eyes closed are challenging.   Pt accompanied by: self  PERTINENT HISTORY: Anemia, DM, HTN, GERD, hiatal hernia, HLD, R ankle fusion 2022, R reverse TSA 2024  PAIN:  Are you having pain? No  PRECAUTIONS: Fall  RED FLAGS: None   WEIGHT BEARING RESTRICTIONS: No  FALLS: Has patient fallen in last 6 months? No  LIVING ENVIRONMENT: Lives with: lives alone Lives in: House/apartment Stairs:  no steps to enter 1 story apartment Has following equipment at home: Single point cane, Environmental consultant - 2 wheeled, Environmental consultant - 4 wheeled, and Grab bars  PLOF: Independent; currently unemployed- looking for an office job; water fitness class 3x/week  PATIENT GOALS: improve balance    OBJECTIVE:    TODAY'S TREATMENT: 04/06/2023 Activity Comments  Corner balance exercises: Feet apart EC 30 sec x 2 Feet together EO 30 sec Review of HEP-good demo Cues for  light UE support  Feet together EO head turns/nods Feet apart EO/EC head turns Feet apart EO/EC bicep curls and shoulder flexion Intermittent UE support  Wide BOS lateral weightshift EO and EC Increased sway and need for UE support with EC  Stagger stand forward/back rocking Increased sway, UE support  Heel/toe raises/ant-post weightshift At counter support  Standing balance on incline/decline-EO head turns/nods, forward/step and weightshfit UE support  Gait with rollator, cues for heelstrike 50 ft  x 4      PATIENT EDUCATION: Education details: Verbally added head turns/nods to [employer] corner balance  Person educated: Patient Education method: Explanation, Demonstration, Tactile cues, Verbal cues, and Handouts Education comprehension: verbalized understanding and returned demonstration  HOME EXERCISE PROGRAM: Access Code: [email or web address] URL: [email or web address] Date: 03/29/2023 Prepared by: [facility] - Outpatient  Rehab - [facility]  Program Notes perform at counter or corner for safety  Exercises - Standing Balance in Corner with Eyes Closed  - 1 x daily - 5 x weekly - 2 sets - 30 sec hold - Corner Balance Feet Together With Eyes Open  - 1 x daily - 5 x weekly - 2 sets - 30 sec hold-add head turns/nods - Standing Gastroc Stretch at Counter  - 1 x daily - 5 x weekly - 2 sets - 30 sec hold - Sit to Stand with Armchair  - 1 x daily - 5 x weekly - 2 sets - 10 reps - Side Stepping with Counter Support  - 1 x daily - 7 x weekly - 1-3 sets - 1-2 min rounds hold - Seated Toe Towel Scrunches  - 1 x daily - 7 x weekly - 1-3 sets - 10 reps ------------------------------------------------- Note: Objective measures were completed at Evaluation unless otherwise noted.  DIAGNOSTIC FINDINGS: none recent    COGNITION: Overall cognitive status: Within functional limits for tasks assessed   SENSATION: Pt reports some diminished sensation in the R foot s/p surgery   POSTURE: rounded shoulders  LOWER EXTREMITY ROM:     Active  Right Eval Left Eval  Hip flexion    Hip extension    Hip abduction    Hip adduction    Hip internal rotation    Hip external rotation    Knee flexion    Knee extension    Ankle dorsiflexion 8 9  Ankle plantarflexion 0 30  Ankle inversion [facility] WFL  Ankle eversion WFL WFL   (Blank rows = not tested)  LOWER EXTREMITY MMT:    MMT (in sitting) Right Eval Left Eval  Hip flexion 4+ 4+  Hip extension    Hip abduction 4+ 4+  Hip adduction 4+ 4+  Hip internal rotation    Hip external rotation    Knee flexion 4+ 4  Knee extension 4 4  Ankle dorsiflexion 4- 4  Ankle plantarflexion 4 4  Ankle inversion 4 4  Ankle eversion 4- 4  (Blank rows = not tested)    GAIT: Gait pattern: forward trunk lean on walker, gait appears rushed d/t multiple quick but small steps; gait attempted without 4WW were not safe d/t imbalance  Assistive device utilized: Walker - 4 wheeled Level of assistance: Modified independence  FUNCTIONAL TESTS:        M-CTSIB  Condition 1: Firm Surface, EO 30 Sec, Normal Sway  Condition 2: Firm Surface, EC 6 Sec, Moderate and Severe Sway  Condition 3: Foam Surface, EO 12 Sec, Moderate and Severe Sway  Condition 4: Foam Surface, EC NT Sec,  NT  Sway                                                                                                                                 TREATMENT DATE: 03/29/23     GOALS: Goals reviewed with patient? Yes  SHORT TERM GOALS: Target date: 04/19/2023  Patient to be independent with initial HEP. Baseline: HEP initiated Goal status: INITIAL    LONG TERM GOALS: Target date: 05/10/2023  Patient to be independent with advanced HEP. Baseline: Not yet initiated  Goal status:  INITIAL  Patient to demonstrate B LE strength >/=4+/5.  Baseline: See above Goal status: INITIAL  Patient to maintain stability on M-CTSIB condition # 2 and 3 for 30 sec without LOB to improve safety in more challenging environments.  Baseline: 6 sec, 12 sec respectively  Goal status: INITIAL  Patient to score at least 46/56 on Berg in order to decrease risk of falls.  Baseline: 42/56 Goal status: INITIAL  Patient to demonstrate 5xSTS test in <15 sec without UE use in order to decrease risk of falls.  Baseline: 11 sec with B UE support  Goal status: INITIAL  Patient to navigate steps with alternating reciprocal pattern with good stability.  Baseline: NT Goal status: INITIAL  ASSESSMENT:  CLINICAL IMPRESSION: Pt presents today with no new complaints. Skilled PT session focused on review of HEP and progression of balance exercises. Pt needs intermittent UE support, as she continues to have increased sway and unsteadiness with eyes closed exercises. Worked on incline in clinic, and pt needs UE support for safety with balance work on incline/decline.  Pt does state she has an ankle brace (?AFO) for L and requested pt bring it in due to lateral instability on LLE with standing and gait activities.  Pt will continue to benefit from skilled PT towards goals for improved functional mobility and decreased fall risk.   OBJECTIVE IMPAIRMENTS: Abnormal gait, decreased activity tolerance, decreased balance, difficulty walking, decreased ROM, decreased strength, impaired flexibility, and postural dysfunction.   ACTIVITY LIMITATIONS: carrying, lifting, bending, sitting, standing, squatting, stairs, transfers, bed mobility, bathing, toileting, dressing, hygiene/grooming, and locomotion level  PARTICIPATION LIMITATIONS: meal prep, cleaning, laundry, driving, shopping, community activity, and church  PERSONAL FACTORS: Age, Past/current experiences, Time since onset of injury/illness/exacerbation, and  3+ comorbidities: Anemia, DM, HTN, GERD, hiatal hernia, HLD, R ankle fusion 2022, R reverse TSA 2024  are also affecting patient's functional outcome.   REHAB POTENTIAL: Good  CLINICAL DECISION MAKING: Evolving/moderate complexity  EVALUATION COMPLEXITY: Moderate  PLAN:  PT FREQUENCY: 1-2x/week  PT DURATION: 6 weeks  PLANNED INTERVENTIONS: 97164- PT Re-evaluation, 97110-Therapeutic exercises, 97530- Therapeutic activity, [id number]- Neuromuscular re-education, 97535- Self Care, 16109- Manual therapy, [id number]- Gait training, [phone]- Canalith repositioning, [id number]- Electrical stimulation (unattended), [phone]- Electrical stimulation (manual), Patient/Family education, Balance training, Stair training, Taping, Dry Needling, Joint mobilization, Spinal mobilization, Vestibular training, DME instructions, Cryotherapy, and Moist heat  PLAN FOR NEXT SESSION:  Pt to  bring in her ankle brace for LLE.  Review HEP; ankle strength, quad strength, balance on uneven surfaces, improving balance confidence.  Outdoor gait on inclines    Lonia Blood, PT 04/06/23 12:19 PM Phone: [phone] Fax: [phone]  [facility] at [facility] [address], [address] [place], [place] [address] Phone # [phone] Fax # [phone]

## 2023-04-09 NOTE — Therapy (Signed)
 OUTPATIENT PHYSICAL THERAPY NEURO TREATMENT   Patient Name: Jodi Keller MRN: [id number] DOB:1955-05-07, 68 y.o., female Today's Date: 04/10/2023   PCP:    Corwin Levins, MD   REFERRING PROVIDER:    Corwin Levins, MD    END OF SESSION:  PT End of Session - 04/10/23 1614     Visit Number 4    Number of Visits 13    Date for PT Re-Evaluation 05/10/23    Authorization Type BCBS Medicare    PT Start Time 1533    PT Stop Time 1613    PT Time Calculation (min) 40 min    Activity Tolerance Patient tolerated treatment well    Behavior During Therapy [facility] for tasks assessed/performed               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 Formatting of this note might be different from the original. Qualifier: Diagnosis of By: Briscoe Burns CMA, Alvy Beal Last Assessment & Plan: Formatting of this note might be different from the original. stable overall by history and exam, recent data reviewed with pt, and pt to continue medical treatment as before,  to f/u any worsening sympto   GERD (gastroesophageal reflux disease)    History of hiatal hernia    Hyperlipidemia 02/25/2011   Hypertension    Morbid obesity (HCC)    Sleep apnea    wears cpap   Uterine prolapse    Past Surgical History:  Procedure Laterality Date   ANKLE FUSION Right 2022   BALLOON DILATION N/A 12/29/2019   Procedure: BALLOON DILATION;  Surgeon: Hilarie Fredrickson, MD;  Location: [facility];  Service: Endoscopy;  Laterality: N/A;   CESAREAN SECTION     COLONOSCOPY WITH PROPOFOL N/A 12/29/2019   Procedure: COLONOSCOPY WITH PROPOFOL;  Surgeon: Hilarie Fredrickson, MD;  Location: [facility];  Service: Endoscopy;  Laterality: N/A;    ESOPHAGOGASTRODUODENOSCOPY (EGD) WITH PROPOFOL N/A 12/29/2019   Procedure: ESOPHAGOGASTRODUODENOSCOPY (EGD) WITH PROPOFOL;  Surgeon: Hilarie Fredrickson, MD;  Location: [facility];  Service: Endoscopy;  Laterality: N/A;   EYE SURGERY     cataract surgery per left eye    FOOT SURGERY     HYSTEROSCOPY WITH D & C N/A 05/04/2022   Procedure: DILATATION AND CURETTAGE /HYSTEROSCOPY;  Surgeon: Silverio Lay, MD;  Location: [facility];  Service: Gynecology;  Laterality: N/A;   INTRAUTERINE DEVICE (IUD) INSERTION N/A 05/04/2022   Procedure: INTRAUTERINE DEVICE (IUD) INSERTION;  Surgeon: Silverio Lay, MD;  Location: [facility];  Service: Gynecology;  Laterality: N/A;   IUD REMOVAL N/A 05/04/2022   Procedure: INTRAUTERINE DEVICE (IUD) REMOVAL;  Surgeon: Silverio Lay, MD;  Location: [facility] [place];  Service: Gynecology;  Laterality: N/A;   POLYPECTOMY N/A 05/04/2022   Procedure: POLYPECTOMY;  Surgeon: Silverio Lay, MD;  Location: [facility];  Service: Gynecology;  Laterality: N/A;   REVERSE SHOULDER ARTHROPLASTY Right 02/10/2022   Procedure: REVERSE SHOULDER ARTHROPLASTY;  Surgeon: Beverely Low, MD;  Location: [facility];  Service: Orthopedics;  Laterality: Right;  120 min choice and interscalene block   Patient Active Problem List   Diagnosis Date Noted   Paresthesias 09/23/2022   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  Pain in right hand 05/16/2022   Impairment of balance 04/20/2022   HTN (hypertension) 03/25/2022   Seborrheic keratoses 03/21/2022   S/P shoulder replacement, right 02/10/2022   Preop exam for internal medicine 10/25/2021   Rotator cuff arthropathy of right shoulder 10/19/2021   Encounter for well adult exam with abnormal findings 07/21/2021   Polycythemia vera (HCC) 06/15/2021   Impingement syndrome of left shoulder region 12/23/2020   CKD (chronic kidney disease) stage 3, GFR 30-59 ml/min (HCC) 12/14/2020   Herpes simplex type 2  infection 05/12/2020   Midline cystocele 05/12/2020   Uterine prolapse 05/12/2020   At risk for sleep apnea 04/05/2020   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   Type 2 diabetes mellitus without complication, without long-term current use of insulin (HCC) 03/29/2020   Soft tissue mass 09/26/2018   Avascular necrosis of talus, left (HCC) 03/22/2016   Left rotator cuff tear 09/17/2015   Gait disorder 11/17/2014   Intertrigo 11/17/2014   Polycythemia 12/20/2011   Diabetes (HCC) 07/21/2011   Hyperlipidemia 02/25/2011   Degenerative joint disease of ankle, 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   Degenerative joint disease of ankle and foot 12/16/2010   Colon cancer screening 12/10/2010   Morbid obesity (HCC) 11/13/2006    ONSET DATE: 2022  REFERRING DIAG: R26.9 (ICD-10-CM) - Gait disorder  THERAPY DIAG:  Unsteadiness on feet  Other abnormalities of gait and mobility  Muscle weakness (generalized)  Pain in right ankle and joints of right foot  Pain in left ankle and joints of left foot  Rationale for Evaluation and Treatment: Rehabilitation  SUBJECTIVE:                                                                                                                                                                                             SUBJECTIVE STATEMENT: Reports L anterior ankle discomfort from her AFO which she brought today. Feels like it is too tight in her foot.    Pt accompanied by: self  PERTINENT HISTORY: Anemia, DM, HTN, GERD, hiatal hernia, HLD, R ankle fusion 2022, R reverse TSA 2024  PAIN:  Are you having pain? No  PRECAUTIONS: Fall  RED FLAGS: None   WEIGHT BEARING RESTRICTIONS: No  FALLS: Has patient fallen in last 6 months? No  LIVING ENVIRONMENT: Lives with: lives alone Lives in: House/apartment Stairs:  no steps to enter 1 story apartment Has following equipment at home: Single point cane, Environmental consultant - 2 wheeled, Environmental consultant - 4 wheeled, and Grab  bars  PLOF: Independent; currently unemployed- looking for an office job; water fitness class 3x/week  PATIENT GOALS: improve balance  OBJECTIVE:     TODAY'S TREATMENT: 04/10/23 Activity Comments  Gait training with [phone] with foot up brace and without Excessive L M/L movement that brace is not controlling; limited foot clearance improvement with brace   sitting ankle inversion and eversion with red TB Cueing to avoid hip rotation   standing EC 3x30" Anterior sway requiring UE support , cueing to push anteriorly through toes to control   Staggered ant/pos with 4WW in front  B UE support; mild-mod instability and some hesitation        PATIENT EDUCATION: Education details: encouraged toebox sneakers to fit AFO and offered foot up brace as alternative option, HEP update, answered pt's questions on stationary bike vs. Nustep for exercise, answered pt's questions on her prognosis  Person educated: Patient Education method: Explanation, Demonstration, Tactile cues, Verbal cues, and Handouts Education comprehension: verbalized understanding and returned demonstration     HOME EXERCISE PROGRAM: Access Code: [email or web address] URL: [email or web address] Date: 04/10/2023 Prepared by: [facility] - Outpatient  Rehab - [facility]  Program Notes perform at counter or corner for safety  Exercises - Standing Balance in Corner with Eyes Closed  - 1 x daily - 5 x weekly - 2 sets - 30 sec hold - Corner Balance Feet Together With Eyes Open  - 1 x daily - 5 x weekly - 2 sets - 30 sec hold - Standing Gastroc Stretch at Counter  - 1 x daily - 5 x weekly - 2 sets - 30 sec hold - Sit to Stand with Armchair  - 1 x daily - 5 x weekly - 2 sets - 10 reps - Side Stepping with Counter Support  - 1 x daily - 7 x weekly - 1-3 sets - 1-2 min rounds hold - Seated Toe Towel Scrunches  - 1 x daily - 7 x weekly - 1-3 sets - 10 reps - Seated Ankle Inversion with Anchored Resistance  - 1 x daily - 5 x  weekly - 2 sets - 10 reps - Seated Ankle Eversion with Anchored Resistance  - 1 x daily - 5 x weekly - 2 sets - 10 reps    ------------------------------------------------- Note: Objective measures were completed at Evaluation unless otherwise noted.  DIAGNOSTIC FINDINGS: none recent   COGNITION: Overall cognitive status: Within functional limits for tasks assessed   SENSATION: Pt reports some diminished sensation in the R foot s/p surgery   POSTURE: rounded shoulders  LOWER EXTREMITY ROM:     Active  Right Eval Left Eval  Hip flexion    Hip extension    Hip abduction    Hip adduction    Hip internal rotation    Hip external rotation    Knee flexion    Knee extension    Ankle dorsiflexion 8 9  Ankle plantarflexion 0 30  Ankle inversion [facility] WFL  Ankle eversion WFL WFL   (Blank rows = not tested)  LOWER EXTREMITY MMT:    MMT (in sitting) Right Eval Left Eval  Hip flexion 4+ 4+  Hip extension    Hip abduction 4+ 4+  Hip adduction 4+ 4+  Hip internal rotation    Hip external rotation    Knee flexion 4+ 4  Knee extension 4 4  Ankle dorsiflexion 4- 4  Ankle plantarflexion 4 4  Ankle inversion 4 4  Ankle eversion 4- 4  (Blank rows = not tested)    GAIT: Gait pattern: forward trunk lean on walker, gait appears rushed d/t multiple quick but  small steps; gait attempted without 4WW were not safe d/t imbalance  Assistive device utilized: Walker - 4 wheeled Level of assistance: Modified independence  FUNCTIONAL TESTS:        M-CTSIB  Condition 1: Firm Surface, EO 30 Sec, Normal Sway  Condition 2: Firm Surface, EC 6 Sec, Moderate and Severe Sway  Condition 3: Foam Surface, EO 12 Sec, Moderate and Severe Sway  Condition 4: Foam Surface, EC NT Sec,  NT  Sway                                                                                                                                 TREATMENT DATE: 03/29/23     GOALS: Goals reviewed with patient?  Yes  SHORT TERM GOALS: Target date: 04/19/2023  Patient to be independent with initial HEP. Baseline: HEP initiated Goal status: IN PROGRESS    LONG TERM GOALS: Target date: 05/10/2023  Patient to be independent with advanced HEP. Baseline: Not yet initiated  Goal status: IN PROGRESS  Patient to demonstrate B LE strength >/=4+/5.  Baseline: See above Goal status: IN PROGRESS  Patient to maintain stability on M-CTSIB condition # 2 and 3 for 30 sec without LOB to improve safety in more challenging environments.  Baseline: 6 sec, 12 sec respectively  Goal status: IN PROGRESS  Patient to score at least 46/56 on Berg in order to decrease risk of falls.  Baseline: 42/56 Goal status: IN PROGRESS  Patient to demonstrate 5xSTS test in <15 sec without UE use in order to decrease risk of falls.  Baseline: 11 sec with B UE support  Goal status: IN PROGRESS  Patient to navigate steps with alternating reciprocal pattern with good stability.  Baseline: NT Goal status: IN PROGRESS  ASSESSMENT:  CLINICAL IMPRESSION: Patient arrived to session with L AFO donned, reporting discomfort from this device. Pt's shoe very tight d/t AFO inserted, thus trialed foot up brace which did not provide enough support. Suggested trying larger shoe with wider toe box to improve comfort. Patient performed L ankle rotation strengthening with cueing to avoid compensations. Standing balance activities required UE support d/t sway and hesitation. No complaints at end of session.   OBJECTIVE IMPAIRMENTS: Abnormal gait, decreased activity tolerance, decreased balance, difficulty walking, decreased ROM, decreased strength, impaired flexibility, and postural dysfunction.   ACTIVITY LIMITATIONS: carrying, lifting, bending, sitting, standing, squatting, stairs, transfers, bed mobility, bathing, toileting, dressing, hygiene/grooming, and locomotion level  PARTICIPATION LIMITATIONS: meal prep, cleaning, laundry, driving,  shopping, community activity, and church  PERSONAL FACTORS: Age, Past/current experiences, Time since onset of injury/illness/exacerbation, and 3+ comorbidities: Anemia, DM, HTN, GERD, hiatal hernia, HLD, R ankle fusion 2022, R reverse TSA 2024  are also affecting patient's functional outcome.   REHAB POTENTIAL: Good  CLINICAL DECISION MAKING: Evolving/moderate complexity  EVALUATION COMPLEXITY: Moderate  PLAN:  PT FREQUENCY: 1-2x/week  PT DURATION: 6 weeks  PLANNED INTERVENTIONS: 97164- PT Re-evaluation, 97110-Therapeutic exercises, 97530- Therapeutic  activity, [id number]- Neuromuscular re-education, [phone]- Self Care, 19147- Manual therapy, [phone]- Gait training, [phone]- Canalith repositioning, [phone]- Electrical stimulation (unattended), [phone]- Electrical stimulation (manual), Patient/Family education, Balance training, Stair training, Taping, Dry Needling, Joint mobilization, Spinal mobilization, Vestibular training, DME instructions, Cryotherapy, and Moist heat  PLAN FOR NEXT SESSION:  Pt to bring in her ankle brace for LLE.  Review HEP; ankle strength, quad strength, balance on uneven surfaces, improving balance confidence.  Outdoor gait on inclines    Baldemar Friday, [place], DPT 04/10/23 4:18 PM  [facility] at [facility] [address] [place], [place] [address] Phone # [phone] Fax # [phone]

## 2023-04-10 ENCOUNTER — Ambulatory Visit: Admitting: Physical Therapy

## 2023-04-10 DIAGNOSIS — R2689 Other abnormalities of gait and mobility: Secondary | ICD-10-CM

## 2023-04-10 DIAGNOSIS — M25572 Pain in left ankle and joints of left foot: Secondary | ICD-10-CM

## 2023-04-10 DIAGNOSIS — R2681 Unsteadiness on feet: Secondary | ICD-10-CM

## 2023-04-10 DIAGNOSIS — M25571 Pain in right ankle and joints of right foot: Secondary | ICD-10-CM | POA: Diagnosis not present

## 2023-04-10 DIAGNOSIS — M6281 Muscle weakness (generalized): Secondary | ICD-10-CM

## 2023-04-10 DIAGNOSIS — R269 Unspecified abnormalities of gait and mobility: Secondary | ICD-10-CM | POA: Diagnosis not present

## 2023-04-12 ENCOUNTER — Encounter: Payer: Self-pay | Admitting: Physical Therapy

## 2023-04-12 ENCOUNTER — Ambulatory Visit: Admitting: Physical Therapy

## 2023-04-12 DIAGNOSIS — R2681 Unsteadiness on feet: Secondary | ICD-10-CM | POA: Diagnosis not present

## 2023-04-12 DIAGNOSIS — M6281 Muscle weakness (generalized): Secondary | ICD-10-CM | POA: Diagnosis not present

## 2023-04-12 DIAGNOSIS — R2689 Other abnormalities of gait and mobility: Secondary | ICD-10-CM | POA: Diagnosis not present

## 2023-04-12 DIAGNOSIS — M25572 Pain in left ankle and joints of left foot: Secondary | ICD-10-CM | POA: Diagnosis not present

## 2023-04-12 DIAGNOSIS — M25571 Pain in right ankle and joints of right foot: Secondary | ICD-10-CM | POA: Diagnosis not present

## 2023-04-12 DIAGNOSIS — R269 Unspecified abnormalities of gait and mobility: Secondary | ICD-10-CM | POA: Diagnosis not present

## 2023-04-12 NOTE — Therapy (Unsigned)
 OUTPATIENT PHYSICAL THERAPY NEURO TREATMENT   Patient Name: Jodi Keller MRN: [id number] DOB:31-Mar-1955, 68 y.o., female Today's Date: 04/13/2023   PCP:    Corwin Levins, MD   REFERRING PROVIDER:    Corwin Levins, MD    END OF SESSION:  PT End of Session - 04/12/23 1448     Visit Number 5    Number of Visits 13    Date for PT Re-Evaluation 05/10/23    Authorization Type BCBS Medicare    PT Start Time 1449    PT Stop Time 1531    PT Time Calculation (min) 42 min    Activity Tolerance Patient tolerated treatment well    Behavior During Therapy [facility] for tasks assessed/performed               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 Formatting of this note might be different from the original. Qualifier: Diagnosis of By: Briscoe Burns CMA, Alvy Beal Last Assessment & Plan: Formatting of this note might be different from the original. stable overall by history and exam, recent data reviewed with pt, and pt to continue medical treatment as before,  to f/u any worsening sympto   GERD (gastroesophageal reflux disease)    History of hiatal hernia    Hyperlipidemia 02/25/2011   Hypertension    Morbid obesity (HCC)    Sleep apnea    wears cpap   Uterine prolapse    Past Surgical History:  Procedure Laterality Date   ANKLE FUSION Right 2022   BALLOON DILATION N/A 12/29/2019   Procedure: BALLOON DILATION;  Surgeon: Hilarie Fredrickson, MD;  Location: [facility];  Service: Endoscopy;  Laterality: N/A;   CESAREAN SECTION     COLONOSCOPY WITH PROPOFOL N/A 12/29/2019   Procedure: COLONOSCOPY WITH PROPOFOL;  Surgeon: Hilarie Fredrickson, MD;  Location: [facility];  Service: Endoscopy;  Laterality: N/A;    ESOPHAGOGASTRODUODENOSCOPY (EGD) WITH PROPOFOL N/A 12/29/2019   Procedure: ESOPHAGOGASTRODUODENOSCOPY (EGD) WITH PROPOFOL;  Surgeon: Hilarie Fredrickson, MD;  Location: [facility];  Service: Endoscopy;  Laterality: N/A;   EYE SURGERY     cataract surgery per left eye    FOOT SURGERY     HYSTEROSCOPY WITH D & C N/A 05/04/2022   Procedure: DILATATION AND CURETTAGE /HYSTEROSCOPY;  Surgeon: Silverio Lay, MD;  Location: [facility];  Service: Gynecology;  Laterality: N/A;   INTRAUTERINE DEVICE (IUD) INSERTION N/A 05/04/2022   Procedure: INTRAUTERINE DEVICE (IUD) INSERTION;  Surgeon: Silverio Lay, MD;  Location: [facility];  Service: Gynecology;  Laterality: N/A;   IUD REMOVAL N/A 05/04/2022   Procedure: INTRAUTERINE DEVICE (IUD) REMOVAL;  Surgeon: Silverio Lay, MD;  Location: [facility];  Service: Gynecology;  Laterality: N/A;   POLYPECTOMY N/A 05/04/2022   Procedure: POLYPECTOMY;  Surgeon: Silverio Lay, MD;  Location: [facility];  Service: Gynecology;  Laterality: N/A;   REVERSE SHOULDER ARTHROPLASTY Right 02/10/2022   Procedure: REVERSE SHOULDER ARTHROPLASTY;  Surgeon: Beverely Low, MD;  Location: [facility];  Service: Orthopedics;  Laterality: Right;  120 min choice and interscalene block   Patient Active Problem List   Diagnosis Date Noted   Paresthesias 09/23/2022   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  Pain in right hand 05/16/2022   Impairment of balance 04/20/2022   HTN (hypertension) 03/25/2022   Seborrheic keratoses 03/21/2022   S/P shoulder replacement, right 02/10/2022   Preop exam for internal medicine 10/25/2021   Rotator cuff arthropathy of right shoulder 10/19/2021   Encounter for well adult exam with abnormal findings 07/21/2021   Polycythemia vera (HCC) 06/15/2021   Impingement syndrome of left shoulder region 12/23/2020   CKD (chronic kidney disease) stage 3, GFR 30-59 ml/min (HCC) 12/14/2020   Herpes simplex type 2  infection 05/12/2020   Midline cystocele 05/12/2020   Uterine prolapse 05/12/2020   At risk for sleep apnea 04/05/2020   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   Type 2 diabetes mellitus without complication, without long-term current use of insulin (HCC) 03/29/2020   Soft tissue mass 09/26/2018   Avascular necrosis of talus, left (HCC) 03/22/2016   Left rotator cuff tear 09/17/2015   Gait disorder 11/17/2014   Intertrigo 11/17/2014   Polycythemia 12/20/2011   Diabetes (HCC) 07/21/2011   Hyperlipidemia 02/25/2011   Degenerative joint disease of ankle, 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   Degenerative joint disease of ankle and foot 12/16/2010   Colon cancer screening 12/10/2010   Morbid obesity (HCC) 11/13/2006    ONSET DATE: 2022  REFERRING DIAG: R26.9 (ICD-10-CM) - Gait disorder  THERAPY DIAG:  Unsteadiness on feet  Other abnormalities of gait and mobility  Muscle weakness (generalized)  Rationale for Evaluation and Treatment: Rehabilitation  SUBJECTIVE:                                                                                                                                                                                             SUBJECTIVE STATEMENT: Did the exercises in the pool yesterday and they are easy in the pool.   Pt accompanied by: self  PERTINENT HISTORY: Anemia, DM, HTN, GERD, hiatal hernia, HLD, R ankle fusion 2022, R reverse TSA 2024  PAIN:  Are you having pain? No  PRECAUTIONS: Fall  RED FLAGS: None   WEIGHT BEARING RESTRICTIONS: No  FALLS: Has patient fallen in last 6 months? No  LIVING ENVIRONMENT: Lives with: lives alone Lives in: House/apartment Stairs:  no steps to enter 1 story apartment Has following equipment at home: Single point cane, Environmental consultant - 2 wheeled, Environmental consultant - 4 wheeled, and Grab bars  PLOF: Independent; currently unemployed- looking for an office job; water fitness class 3x/week  PATIENT GOALS: improve balance    OBJECTIVE:     TODAY'S TREATMENT: 04/12/2023 Activity Comments  Staggered stance forward/back rocking for active ankle motion      Gastroc stretch standing and  seated with strap Limitations in R ankle  Seated heel raises to slowly lower, 2 x 10 reps Cues for slowed eccentric control  Seated trunk rotation and reaching for trunk stability ex; Forward and cross body reaching to floor/cones   On decline:  standing feet apart with EO head turns/nods, then EC Forward step/return to midline standing on decline   Step back and rock, x 10 reps BUE support  Standing unsupported, x 4 minutes for playing cards activities 1 LOB at end, where she reached to elevated mat to regain balance.      PATIENT EDUCATION: Education details: Updates to HEP Person educated: Patient Education method: Explanation, Demonstration, Actor cues, Verbal cues, and Handouts Education comprehension: verbalized understanding and returned demonstration     HOME EXERCISE PROGRAM:  Access Code: [email or web address] URL: [email or web address] Date: 04/12/2023 Prepared by: [facility] - Outpatient  Rehab - [facility]  Program Notes use your foot pedaler, 5-10 minutes, 1-2x/dayTry to use it on a table with your arms, 2-3 min forward  Exercises - Sit to Stand  - 1-2 x daily - 7 x weekly - 3 sets - 5 reps - Seated Hamstring Stretch  - 1-2 x daily - 7 x weekly - 1 sets - 3 reps - 30 sec hold - Side Stepping with Counter Support  - 1 x daily - 7 x weekly - 1 sets - 3 reps - March in Place  - 1 x daily - 7 x weekly - 2 sets - 10 reps - Seated Heel Raise  - 1-2 x daily - 7 x weekly - 2-3 sets - 10 reps - Alternating Step Backward with Support  - 1 x daily - 7 x weekly - 2-3 sets - 10 reps ------------------------------------------------- Note: Objective measures were completed at Evaluation unless otherwise noted.  DIAGNOSTIC FINDINGS: none recent   COGNITION: Overall cognitive status: Within  functional limits for tasks assessed   SENSATION: Pt reports some diminished sensation in the R foot s/p surgery   POSTURE: rounded shoulders  LOWER EXTREMITY ROM:     Active  Right Eval Left Eval  Hip flexion    Hip extension    Hip abduction    Hip adduction    Hip internal rotation    Hip external rotation    Knee flexion    Knee extension    Ankle dorsiflexion 8 9  Ankle plantarflexion 0 30  Ankle inversion [facility] WFL  Ankle eversion WFL WFL   (Blank rows = not tested)  LOWER EXTREMITY MMT:    MMT (in sitting) Right Eval Left Eval  Hip flexion 4+ 4+  Hip extension    Hip abduction 4+ 4+  Hip adduction 4+ 4+  Hip internal rotation    Hip external rotation    Knee flexion 4+ 4  Knee extension 4 4  Ankle dorsiflexion 4- 4  Ankle plantarflexion 4 4  Ankle inversion 4 4  Ankle eversion 4- 4  (Blank rows = not tested)    GAIT: Gait pattern: forward trunk lean on walker, gait appears rushed d/t multiple quick but small steps; gait attempted without 4WW were not safe d/t imbalance  Assistive device utilized: Walker - 4 wheeled Level of assistance: Modified independence  FUNCTIONAL TESTS:        M-CTSIB  Condition 1: Firm Surface, EO 30 Sec, Normal Sway  Condition 2: Firm Surface, EC 6 Sec, Moderate and Severe Sway  Condition 3: Foam Surface, EO 12 Sec, Moderate and Severe Sway  Condition 4: Foam Surface, EC NT Sec,  NT  Sway                                                                                                                                 TREATMENT DATE: 03/29/23     GOALS: Goals reviewed with patient? Yes  SHORT TERM GOALS: Target date: 04/19/2023  Patient to be independent with initial HEP. Baseline: HEP initiated Goal status: IN PROGRESS    LONG TERM GOALS: Target date: 05/10/2023  Patient to be independent with advanced HEP. Baseline: Not yet initiated  Goal status: IN PROGRESS  Patient to demonstrate B LE strength >/=4+/5.   Baseline: See above Goal status: IN PROGRESS  Patient to maintain stability on M-CTSIB condition # 2 and 3 for 30 sec without LOB to improve safety in more challenging environments.  Baseline: 6 sec, 12 sec respectively  Goal status: IN PROGRESS  Patient to score at least 46/56 on Berg in order to decrease risk of falls.  Baseline: 42/56 Goal status: IN PROGRESS  Patient to demonstrate 5xSTS test in <15 sec without UE use in order to decrease risk of falls.  Baseline: 11 sec with B UE support  Goal status: IN PROGRESS  Patient to navigate steps with alternating reciprocal pattern with good stability.  Baseline: NT Goal status: IN PROGRESS  ASSESSMENT:  CLINICAL IMPRESSION: Pt presents today with no new complaints. Skilled PT session focused on review of and progression of HEP. Continued work on ankle flexibility and ankle/hip strategy work, progressing to some step strategy work.  At end of session, with standing activity, she does lean forward and lose balance (due to decreased ankle/hip strategy availability for balance) and reaches for UE support; she would likely benefit from additional work on step strategy, given limitations in ankle motion and ankle strategy.  Pt will continue to benefit from skilled PT towards goals for improved functional mobility and decreased fall risk.   OBJECTIVE IMPAIRMENTS: Abnormal gait, decreased activity tolerance, decreased balance, difficulty walking, decreased ROM, decreased strength, impaired flexibility, and postural dysfunction.   ACTIVITY LIMITATIONS: carrying, lifting, bending, sitting, standing, squatting, stairs, transfers, bed mobility, bathing, toileting, dressing, hygiene/grooming, and locomotion level  PARTICIPATION LIMITATIONS: meal prep, cleaning, laundry, driving, shopping, community activity, and church  PERSONAL FACTORS: Age, Past/current experiences, Time since onset of injury/illness/exacerbation, and 3+ comorbidities: Anemia,  DM, HTN, GERD, hiatal hernia, HLD, R ankle fusion 2022, R reverse TSA 2024  are also affecting patient's functional outcome.   REHAB POTENTIAL: Good  CLINICAL DECISION MAKING: Evolving/moderate complexity  EVALUATION COMPLEXITY: Moderate  PLAN:  PT FREQUENCY: 1-2x/week  PT DURATION: 6 weeks  PLANNED INTERVENTIONS: 97164- PT Re-evaluation, 97110-Therapeutic exercises, 97530- Therapeutic activity, 97112- Neuromuscular re-education, 97535- Self Care, 95621- Manual therapy, [phone]- Gait training, [phone]- Canalith repositioning, [id number]- Electrical stimulation (unattended), [phone]- Electrical stimulation (manual), Patient/Family education, Balance training, Stair training, Taping, Dry Needling, Joint mobilization, Spinal mobilization, Vestibular training, DME instructions,  Cryotherapy, and Moist heat  PLAN FOR NEXT SESSION:  Step strategy work; Review HEP; ankle strength, quad strength, balance on uneven surfaces, improving balance confidence.  Outdoor gait on inclines    Lonia Blood, PT 04/13/23 12:43 PM Phone: [phone] Fax: [phone]  [facility] at [facility] [address], [address] [place], [place] [address] Phone # [phone] Fax # [phone]

## 2023-04-12 NOTE — Therapy (Signed)
 OUTPATIENT PHYSICAL THERAPY NEURO TREATMENT   Patient Name: Jodi Keller MRN: [id number] DOB:05-18-1955, 68 y.o., female Today's Date: 04/16/2023   PCP:    Corwin Levins, MD   REFERRING PROVIDER:    Corwin Levins, MD    END OF SESSION:  PT End of Session - 04/16/23 1441     Visit Number 6    Number of Visits 13    Date for PT Re-Evaluation 05/10/23    Authorization Type BCBS Medicare    PT Start Time 1403    PT Stop Time 1444    PT Time Calculation (min) 41 min    Activity Tolerance Patient tolerated treatment well;Other (comment)   lightheadedness   Behavior During Therapy [facility] for tasks assessed/performed                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 Formatting of this note might be different from the original. Qualifier: Diagnosis of By: Briscoe Burns CMA, Alvy Beal Last Assessment & Plan: Formatting of this note might be different from the original. stable overall by history and exam, recent data reviewed with pt, and pt to continue medical treatment as before,  to f/u any worsening sympto   GERD (gastroesophageal reflux disease)    History of hiatal hernia    Hyperlipidemia 02/25/2011   Hypertension    Morbid obesity (HCC)    Sleep apnea    wears cpap   Uterine prolapse    Past Surgical History:  Procedure Laterality Date   ANKLE FUSION Right 2022   BALLOON DILATION N/A 12/29/2019   Procedure: BALLOON DILATION;  Surgeon: Hilarie Fredrickson, MD;  Location: [facility];  Service: Endoscopy;  Laterality: N/A;   CESAREAN SECTION     COLONOSCOPY WITH PROPOFOL N/A 12/29/2019   Procedure: COLONOSCOPY WITH PROPOFOL;  Surgeon: Hilarie Fredrickson, MD;  Location: [facility];  Service: Endoscopy;   Laterality: N/A;   ESOPHAGOGASTRODUODENOSCOPY (EGD) WITH PROPOFOL N/A 12/29/2019   Procedure: ESOPHAGOGASTRODUODENOSCOPY (EGD) WITH PROPOFOL;  Surgeon: Hilarie Fredrickson, MD;  Location: [facility];  Service: Endoscopy;  Laterality: N/A;   EYE SURGERY     cataract surgery per left eye    FOOT SURGERY     HYSTEROSCOPY WITH D & C N/A 05/04/2022   Procedure: DILATATION AND CURETTAGE /HYSTEROSCOPY;  Surgeon: Silverio Lay, MD;  Location: [facility];  Service: Gynecology;  Laterality: N/A;   INTRAUTERINE DEVICE (IUD) INSERTION N/A 05/04/2022   Procedure: INTRAUTERINE DEVICE (IUD) INSERTION;  Surgeon: Silverio Lay, MD;  Location: [facility];  Service: Gynecology;  Laterality: N/A;   IUD REMOVAL N/A 05/04/2022   Procedure: INTRAUTERINE DEVICE (IUD) REMOVAL;  Surgeon: Silverio Lay, MD;  Location: 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   Pain in right hand 05/16/2022   Impairment of balance 04/20/2022   HTN (hypertension) 03/25/2022   Seborrheic keratoses 03/21/2022   S/P shoulder replacement, right 02/10/2022   Preop exam for internal medicine 10/25/2021   Rotator cuff arthropathy of right shoulder 10/19/2021   Encounter for well adult exam with abnormal findings 07/21/2021   Polycythemia vera (HCC) 06/15/2021   Impingement syndrome of left shoulder region 12/23/2020   CKD (chronic kidney disease) stage 3, GFR 30-59 ml/min (HCC) 12/14/2020    Herpes simplex type 2 infection 05/12/2020   Midline cystocele 05/12/2020   Uterine prolapse 05/12/2020   At risk for sleep apnea 04/05/2020   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   Type 2 diabetes mellitus without complication, without long-term current use of insulin (HCC) 03/29/2020   Soft tissue mass 09/26/2018   Avascular necrosis of talus, left (HCC) 03/22/2016   Left rotator cuff tear 09/17/2015   Gait disorder 11/17/2014   Intertrigo 11/17/2014   Polycythemia 12/20/2011   Diabetes (HCC) 07/21/2011   Hyperlipidemia 02/25/2011   Degenerative joint disease of ankle, 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   Degenerative joint disease of ankle and foot 12/16/2010   Colon cancer screening 12/10/2010   Morbid obesity (HCC) 11/13/2006    ONSET DATE: 2022  REFERRING DIAG: R26.9 (ICD-10-CM) - Gait disorder  THERAPY DIAG:  Unsteadiness on feet  Other abnormalities of gait and mobility  Muscle weakness (generalized)  Rationale for Evaluation and Treatment: Rehabilitation  SUBJECTIVE:                                                                                                                                                                                             SUBJECTIVE STATEMENT: Went to the pool since last session.   Pt accompanied by: self  PERTINENT HISTORY: Anemia, DM, HTN, GERD, hiatal hernia, HLD, R ankle fusion 2022, R reverse TSA 2024  PAIN:  Are you having pain? No  PRECAUTIONS: Fall  RED FLAGS: None   WEIGHT BEARING RESTRICTIONS: No  FALLS: Has patient fallen in last 6 months? No  LIVING ENVIRONMENT: Lives with: lives alone Lives in: House/apartment Stairs:  no steps to enter 1 story apartment Has following equipment at home: Single point cane, Environmental consultant - 2 wheeled, Environmental consultant - 4 wheeled, and Grab bars  PLOF: Independent; currently unemployed- looking for an office job; water fitness class 3x/week  PATIENT GOALS: improve balance   OBJECTIVE:        TODAY'S TREATMENT: 04/12/23 Activity Comments  standing on foam: static stance  ant/pos wt shift  L foot falling into inversion in standing.  Required B UE support on walker in front. Limited posterior wt shift   Sitting gastroc stretch 2x30" each With strap   standing heel/toe raises 2x10 Discontinued d/t very limited amplitude   Sitting heel raises 2x10 with 5# on knees  Improved amplitude  standing HS curl 3# 2x10 each LE Cues to slow down   Gait training down sidewalk ramps  Cueing to transfer wt anteriorly onto toes when descending. C/o brief lightheadedness which resolved with water break  standing EC Weaning to 1 UE support. Pt reported lightheadedness again- reports that she did not eat lunch thus provided snack     Access Code: [email or web address] URL: [email or web address] Date: 04/12/2023 Prepared by: [facility] - Outpatient  Rehab - [facility]  Program Notes use your foot pedaler, 5-10 minutes, 1-2x/dayTry to use it on a table with your arms, 2-3 min forward  Exercises - Sit to Stand  - 1-2 x daily - 7 x weekly - 3 sets - 5 reps - Seated Hamstring Stretch  - 1-2 x daily - 7 x weekly - 1 sets - 3 reps - 30 sec hold - Side Stepping with Counter Support  - 1 x daily - 7 x weekly - 1 sets - 3 reps - March in Place  - 1 x daily - 7 x weekly - 2 sets - 10 reps - Seated Heel Raise  - 1-2 x daily - 7 x weekly - 2-3 sets - 10 reps - Alternating Step Backward with Support  - 1 x daily - 7 x weekly - 2-3 sets - 10 reps  ------------------------------------------------- Note: Objective measures were completed at Evaluation unless otherwise noted.  DIAGNOSTIC FINDINGS: none recent   COGNITION: Overall cognitive status: Within functional limits for tasks assessed   SENSATION: Pt reports some diminished sensation in the R foot s/p surgery   POSTURE: rounded shoulders  LOWER EXTREMITY ROM:     Active  Right Eval Left Eval  Hip flexion    Hip extension     Hip abduction    Hip adduction    Hip internal rotation    Hip external rotation    Knee flexion    Knee extension    Ankle dorsiflexion 8 9  Ankle plantarflexion 0 30  Ankle inversion [facility] WFL  Ankle eversion WFL WFL   (Blank rows = not tested)  LOWER EXTREMITY MMT:    MMT (in sitting) Right Eval Left Eval  Hip flexion 4+ 4+  Hip extension    Hip abduction 4+ 4+  Hip adduction 4+ 4+  Hip internal rotation    Hip external rotation    Knee flexion 4+ 4  Knee extension 4 4  Ankle dorsiflexion 4- 4  Ankle plantarflexion 4 4  Ankle inversion 4 4  Ankle eversion 4- 4  (Blank rows = not tested)    GAIT: Gait pattern: forward trunk lean on walker, gait appears rushed d/t multiple quick but small steps; gait attempted without 4WW were not safe d/t imbalance  Assistive device utilized: Walker - 4 wheeled Level of assistance: Modified independence  FUNCTIONAL TESTS:        M-CTSIB  Condition 1: Firm Surface, EO 30 Sec, Normal Sway  Condition 2: Firm Surface, EC 6 Sec, Moderate and Severe Sway  Condition 3: Foam Surface, EO 12 Sec, Moderate and Severe Sway  Condition 4: Foam Surface, EC NT Sec,  NT  Sway  TREATMENT DATE: 03/29/23     GOALS: Goals reviewed with patient? Yes  SHORT TERM GOALS: Target date: 04/19/2023  Patient to be independent with initial HEP. Baseline: HEP initiated Goal status: IN PROGRESS    LONG TERM GOALS: Target date: 05/10/2023  Patient to be independent with advanced HEP. Baseline: Not yet initiated  Goal status: IN PROGRESS  Patient to demonstrate B LE strength >/=4+/5.  Baseline: See above Goal status: IN PROGRESS  Patient to maintain stability on M-CTSIB condition # 2 and 3 for 30 sec without LOB to improve safety in more challenging environments.  Baseline: 6 sec, 12 sec respectively  Goal status:  IN PROGRESS  Patient to score at least 46/56 on Berg in order to decrease risk of falls.  Baseline: 42/56 Goal status: IN PROGRESS  Patient to demonstrate 5xSTS test in <15 sec without UE use in order to decrease risk of falls.  Baseline: 11 sec with B UE support  Goal status: IN PROGRESS  Patient to navigate steps with alternating reciprocal pattern with good stability.  Baseline: NT Goal status: IN PROGRESS  ASSESSMENT:  CLINICAL IMPRESSION: Patient arrived to session without complaints. Worked on standing balance on foam which required B UE support. Patient demonstrated very limited control of posterior wt shift d/t ankle weakness. Unable to perform standing heel raises, better success in sitting with light weights. Patient reported some lightheadedness during session today- provided water and snack break as pt reported not eating lunch before arriving to session. Reported feeling better before leaving.   OBJECTIVE IMPAIRMENTS: Abnormal gait, decreased activity tolerance, decreased balance, difficulty walking, decreased ROM, decreased strength, impaired flexibility, and postural dysfunction.   ACTIVITY LIMITATIONS: carrying, lifting, bending, sitting, standing, squatting, stairs, transfers, bed mobility, bathing, toileting, dressing, hygiene/grooming, and locomotion level  PARTICIPATION LIMITATIONS: meal prep, cleaning, laundry, driving, shopping, community activity, and church  PERSONAL FACTORS: Age, Past/current experiences, Time since onset of injury/illness/exacerbation, and 3+ comorbidities: Anemia, DM, HTN, GERD, hiatal hernia, HLD, R ankle fusion 2022, R reverse TSA 2024  are also affecting patient's functional outcome.   REHAB POTENTIAL: Good  CLINICAL DECISION MAKING: Evolving/moderate complexity  EVALUATION COMPLEXITY: Moderate  PLAN:  PT FREQUENCY: 1-2x/week  PT DURATION: 6 weeks  PLANNED INTERVENTIONS: [id number]- PT Re-evaluation, 97110-Therapeutic exercises, 97530-  Therapeutic activity, [id number]- Neuromuscular re-education, 97535- Self Care, [id number]- Manual therapy, [id number]- Gait training, [phone]- Canalith repositioning, [id number]- Electrical stimulation (unattended), [phone]- Electrical stimulation (manual), Patient/Family education, Balance training, Stair training, Taping, Dry Needling, Joint mobilization, Spinal mobilization, Vestibular training, DME instructions, Cryotherapy, and Moist heat  PLAN FOR NEXT SESSION:  Pt to bring in her ankle brace for LLE.  Review HEP; ankle strength, quad strength, balance on uneven surfaces, improving balance confidence.  Outdoor gait on inclines   Baldemar Friday, [place], DPT 04/16/23 2:46 PM  [facility] at [facility] [address] [place], [place] [address] Phone # [phone] Fax # [phone]

## 2023-04-16 ENCOUNTER — Ambulatory Visit: Admitting: Physical Therapy

## 2023-04-16 ENCOUNTER — Encounter: Payer: Self-pay | Admitting: Physical Therapy

## 2023-04-16 DIAGNOSIS — M25571 Pain in right ankle and joints of right foot: Secondary | ICD-10-CM | POA: Diagnosis not present

## 2023-04-16 DIAGNOSIS — R2689 Other abnormalities of gait and mobility: Secondary | ICD-10-CM

## 2023-04-16 DIAGNOSIS — M6281 Muscle weakness (generalized): Secondary | ICD-10-CM | POA: Diagnosis not present

## 2023-04-16 DIAGNOSIS — M25572 Pain in left ankle and joints of left foot: Secondary | ICD-10-CM | POA: Diagnosis not present

## 2023-04-16 DIAGNOSIS — R2681 Unsteadiness on feet: Secondary | ICD-10-CM | POA: Diagnosis not present

## 2023-04-16 DIAGNOSIS — R269 Unspecified abnormalities of gait and mobility: Secondary | ICD-10-CM | POA: Diagnosis not present

## 2023-04-18 ENCOUNTER — Ambulatory Visit: Admitting: Physical Therapy

## 2023-04-23 ENCOUNTER — Encounter: Payer: Self-pay | Admitting: Physical Therapy

## 2023-04-23 ENCOUNTER — Ambulatory Visit: Admitting: Physical Therapy

## 2023-04-23 DIAGNOSIS — M25571 Pain in right ankle and joints of right foot: Secondary | ICD-10-CM | POA: Diagnosis not present

## 2023-04-23 DIAGNOSIS — M6281 Muscle weakness (generalized): Secondary | ICD-10-CM | POA: Diagnosis not present

## 2023-04-23 DIAGNOSIS — M25572 Pain in left ankle and joints of left foot: Secondary | ICD-10-CM | POA: Diagnosis not present

## 2023-04-23 DIAGNOSIS — R2681 Unsteadiness on feet: Secondary | ICD-10-CM

## 2023-04-23 DIAGNOSIS — R269 Unspecified abnormalities of gait and mobility: Secondary | ICD-10-CM | POA: Diagnosis not present

## 2023-04-23 DIAGNOSIS — R2689 Other abnormalities of gait and mobility: Secondary | ICD-10-CM

## 2023-04-23 NOTE — Therapy (Signed)
 OUTPATIENT PHYSICAL THERAPY NEURO TREATMENT   Patient Name: Jodi Keller MRN: [id number] DOB:Apr 04, 1955, 68 y.o., female Today's Date: 04/23/2023   PCP:    Corwin Levins, MD   REFERRING PROVIDER:    Corwin Levins, MD    END OF SESSION:  PT End of Session - 04/23/23 1105     Visit Number 7    Number of Visits 13    Date for PT Re-Evaluation 05/10/23    Authorization Type BCBS Medicare    PT Start Time 1105    PT Stop Time 1144    PT Time Calculation (min) 39 min    Activity Tolerance Patient tolerated treatment well;Patient limited by fatigue   has some nausea   Behavior During Therapy [facility] for tasks assessed/performed                 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 Formatting of this note might be different from the original. Qualifier: Diagnosis of By: Briscoe Burns CMA, Alvy Beal Last Assessment & Plan: Formatting of this note might be different from the original. stable overall by history and exam, recent data reviewed with pt, and pt to continue medical treatment as before,  to f/u any worsening sympto   GERD (gastroesophageal reflux disease)    History of hiatal hernia    Hyperlipidemia 02/25/2011   Hypertension    Morbid obesity (HCC)    Sleep apnea    wears cpap   Uterine prolapse    Past Surgical History:  Procedure Laterality Date   ANKLE FUSION Right 2022   BALLOON DILATION N/A 12/29/2019   Procedure: BALLOON DILATION;  Surgeon: Hilarie Fredrickson, MD;  Location: [facility];  Service: Endoscopy;  Laterality: N/A;   CESAREAN SECTION     COLONOSCOPY WITH PROPOFOL N/A 12/29/2019   Procedure: COLONOSCOPY WITH PROPOFOL;  Surgeon: Hilarie Fredrickson, MD;  Location: [facility];   Service: Endoscopy;  Laterality: N/A;   ESOPHAGOGASTRODUODENOSCOPY (EGD) WITH PROPOFOL N/A 12/29/2019   Procedure: ESOPHAGOGASTRODUODENOSCOPY (EGD) WITH PROPOFOL;  Surgeon: Hilarie Fredrickson, MD;  Location: [facility];  Service: Endoscopy;  Laterality: N/A;   EYE SURGERY     cataract surgery per left eye    FOOT SURGERY     HYSTEROSCOPY WITH D & C N/A 05/04/2022   Procedure: DILATATION AND CURETTAGE /HYSTEROSCOPY;  Surgeon: Silverio Lay, MD;  Location: [facility];  Service: Gynecology;  Laterality: N/A;   INTRAUTERINE DEVICE (IUD) INSERTION N/A 05/04/2022   Procedure: INTRAUTERINE DEVICE (IUD) INSERTION;  Surgeon: Silverio Lay, MD;  Location: [facility] [place];  Service: Gynecology;  Laterality: N/A;   IUD REMOVAL N/A 05/04/2022   Procedure: INTRAUTERINE DEVICE (IUD) REMOVAL;  Surgeon: Silverio Lay, MD;  Location: [facility] [place];  Service: Gynecology;  Laterality: N/A;   POLYPECTOMY N/A 05/04/2022   Procedure: POLYPECTOMY;  Surgeon: Silverio Lay, MD;  Location: [facility];  Service: Gynecology;  Laterality: N/A;   REVERSE SHOULDER ARTHROPLASTY Right 02/10/2022   Procedure: REVERSE SHOULDER ARTHROPLASTY;  Surgeon: Beverely Low, MD;  Location: [facility];  Service: Orthopedics;  Laterality: Right;  120 min choice and interscalene block   Patient Active Problem List   Diagnosis Date Noted  Paresthesias 09/23/2022   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   Pain in right hand 05/16/2022   Impairment of balance 04/20/2022   HTN (hypertension) 03/25/2022   Seborrheic keratoses 03/21/2022   S/P shoulder replacement, right 02/10/2022   Preop exam for internal medicine 10/25/2021   Rotator cuff arthropathy of right shoulder 10/19/2021   Encounter for well adult exam with abnormal findings 07/21/2021   Polycythemia vera (HCC) 06/15/2021   Impingement syndrome of left shoulder region 12/23/2020   CKD (chronic kidney disease) stage 3, GFR 30-59 ml/min  (HCC) 12/14/2020   Herpes simplex type 2 infection 05/12/2020   Midline cystocele 05/12/2020   Uterine prolapse 05/12/2020   At risk for sleep apnea 04/05/2020   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   Type 2 diabetes mellitus without complication, without long-term current use of insulin (HCC) 03/29/2020   Soft tissue mass 09/26/2018   Avascular necrosis of talus, left (HCC) 03/22/2016   Left rotator cuff tear 09/17/2015   Gait disorder 11/17/2014   Intertrigo 11/17/2014   Polycythemia 12/20/2011   Diabetes (HCC) 07/21/2011   Hyperlipidemia 02/25/2011   Degenerative joint disease of ankle, 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   Degenerative joint disease of ankle and foot 12/16/2010   Colon cancer screening 12/10/2010   Morbid obesity (HCC) 11/13/2006    ONSET DATE: 2022  REFERRING DIAG: R26.9 (ICD-10-CM) - Gait disorder  THERAPY DIAG:  Unsteadiness on feet  Other abnormalities of gait and mobility  Muscle weakness (generalized)  Rationale for Evaluation and Treatment: Rehabilitation  SUBJECTIVE:                                                                                                                                                                                             SUBJECTIVE STATEMENT: Still having some issues with my stomach.  Not sure what it is.  Trying to stay balanced with standing.  Not wearing brace today.  Pt accompanied by: self  PERTINENT HISTORY: Anemia, DM, HTN, GERD, hiatal hernia, HLD, R ankle fusion 2022, R reverse TSA 2024  PAIN:  Are you having pain? No  PRECAUTIONS: Fall  RED FLAGS: None   WEIGHT BEARING RESTRICTIONS: No  FALLS: Has patient fallen in last 6 months? No  LIVING ENVIRONMENT: Lives with: lives alone Lives in: House/apartment Stairs:  no steps to enter 1 story apartment Has following equipment at home: Single point cane, Environmental consultant - 2 wheeled, Environmental consultant - 4 wheeled, and Grab bars  PLOF: Independent; currently  unemployed- looking for an office job; water fitness class 3x/week  PATIENT GOALS: improve balance   OBJECTIVE:     TODAY'S TREATMENT: 04/23/2023 Activity  Comments  Review of HEP-especially balance ex Good return demo  Standing feet apart with EC head turns/head nods x 5  Light UE support  Sit to stand, 2 x 5 reps Cues for quad/glut activation, upon standing, 3 sec  Standing hamstring curls, 2 x 10, 3# BLEs Standing at locked walker  Sidestep/together R and L,2 x  10 reps 3#  Box step (4-square step) activity, 4 reps UE support at counter, 3#  Gait training down sidewalk ramps  Cues for anterior weightshift through front part of foot  Gait in parallel bars forward walking  Cues for push-off  Standing feet apart EC, approx 5 sec, 3 attempts Reports feeling hot and nauseous    Access Code: [email or web address] *(Corrected HEP code) URL: [email or web address] Date: 04/23/2023 Prepared by: [facility] - Outpatient  Rehab - [facility]  Program Notes perform at counter or corner for safety  Exercises - Standing Balance in Corner with Eyes Closed  - 1 x daily - 5 x weekly - 2 sets - 30 sec hold - Corner Balance Feet Together With Eyes Open  - 1 x daily - 5 x weekly - 2 sets - 30 sec hold - Standing Gastroc Stretch at Counter  - 1 x daily - 5 x weekly - 2 sets - 30 sec hold - Sit to Stand with Armchair  - 1 x daily - 5 x weekly - 2 sets - 10 reps - Side Stepping with Counter Support  - 1 x daily - 7 x weekly - 1-3 sets - 1-2 min rounds hold - Seated Toe Towel Scrunches  - 1 x daily - 7 x weekly - 1-3 sets - 10 reps - Seated Ankle Inversion with Anchored Resistance  - 1 x daily - 5 x weekly - 2 sets - 10 reps - Seated Ankle Eversion with Anchored Resistance  - 1 x daily - 5 x weekly - 2 sets - 10 reps  PATIENT EDUCATION: Education details: HEP review and educated in importance of HEP consistency Person educated: Patient Education method: Explanation, Demonstration, and Verbal  cues Education comprehension: verbalized understanding, returned demonstration, and verbal cues required     ------------------------------------------------- Note: Objective measures were completed at Evaluation unless otherwise noted.  DIAGNOSTIC FINDINGS: none recent   COGNITION: Overall cognitive status: Within functional limits for tasks assessed   SENSATION: Pt reports some diminished sensation in the R foot s/p surgery   POSTURE: rounded shoulders  LOWER EXTREMITY ROM:     Active  Right Eval Left Eval  Hip flexion    Hip extension    Hip abduction    Hip adduction    Hip internal rotation    Hip external rotation    Knee flexion    Knee extension    Ankle dorsiflexion 8 9  Ankle plantarflexion 0 30  Ankle inversion [facility] WFL  Ankle eversion WFL WFL   (Blank rows = not tested)  LOWER EXTREMITY MMT:    MMT (in sitting) Right Eval Left Eval  Hip flexion 4+ 4+  Hip extension    Hip abduction 4+ 4+  Hip adduction 4+ 4+  Hip internal rotation    Hip external rotation    Knee flexion 4+ 4  Knee extension 4 4  Ankle dorsiflexion 4- 4  Ankle plantarflexion 4 4  Ankle inversion 4 4  Ankle eversion 4- 4  (Blank rows = not tested)    GAIT: Gait pattern: forward trunk lean on walker, gait appears rushed d/t multiple  quick but small steps; gait attempted without 4WW were not safe d/t imbalance  Assistive device utilized: Walker - 4 wheeled Level of assistance: Modified independence  FUNCTIONAL TESTS:        M-CTSIB  Condition 1: Firm Surface, EO 30 Sec, Normal Sway  Condition 2: Firm Surface, EC 6 Sec, Moderate and Severe Sway  Condition 3: Foam Surface, EO 12 Sec, Moderate and Severe Sway  Condition 4: Foam Surface, EC NT Sec,  NT  Sway                                                                                                                                 TREATMENT DATE: 03/29/23     GOALS: Goals reviewed with patient? Yes  SHORT TERM  GOALS: Target date: 04/19/2023  Patient to be independent with initial HEP. Baseline: HEP reviewed with good form; reports not doing all of these Goal status: GOAL PARTIALLY MET    LONG TERM GOALS: Target date: 05/10/2023  Patient to be independent with advanced HEP. Baseline: Not yet initiated  Goal status: IN PROGRESS  Patient to demonstrate B LE strength >/=4+/5.  Baseline: See above Goal status: IN PROGRESS  Patient to maintain stability on M-CTSIB condition # 2 and 3 for 30 sec without LOB to improve safety in more challenging environments.  Baseline: 6 sec, 12 sec respectively  Goal status: IN PROGRESS  Patient to score at least 46/56 on Berg in order to decrease risk of falls.  Baseline: 42/56 Goal status: IN PROGRESS  Patient to demonstrate 5xSTS test in <15 sec without UE use in order to decrease risk of falls.  Baseline: 11 sec with B UE support  Goal status: IN PROGRESS  Patient to navigate steps with alternating reciprocal pattern with good stability.  Baseline: NT Goal status: IN PROGRESS  ASSESSMENT:  CLINICAL IMPRESSION: Pt presents today with reports of not feeling well last week. Skilled PT session focused on review of HEP, with pt partially meeting STG 1 due to not always consistently performing HEP.  Worked on static balance activities, and with EC, pt needs  UE support. Pt will continue to benefit from skilled PT towards goals for improved functional mobility and decreased fall risk.   OBJECTIVE IMPAIRMENTS: Abnormal gait, decreased activity tolerance, decreased balance, difficulty walking, decreased ROM, decreased strength, impaired flexibility, and postural dysfunction.   ACTIVITY LIMITATIONS: carrying, lifting, bending, sitting, standing, squatting, stairs, transfers, bed mobility, bathing, toileting, dressing, hygiene/grooming, and locomotion level  PARTICIPATION LIMITATIONS: meal prep, cleaning, laundry, driving, shopping, community activity, and  church  PERSONAL FACTORS: Age, Past/current experiences, Time since onset of injury/illness/exacerbation, and 3+ comorbidities: Anemia, DM, HTN, GERD, hiatal hernia, HLD, R ankle fusion 2022, R reverse TSA 2024  are also affecting patient's functional outcome.   REHAB POTENTIAL: Good  CLINICAL DECISION MAKING: Evolving/moderate complexity  EVALUATION COMPLEXITY: Moderate  PLAN:  PT FREQUENCY: 1-2x/week  PT DURATION: 6 weeks  PLANNED INTERVENTIONS:  60454- PT Re-evaluation, 97110-Therapeutic exercises, 97530- Therapeutic activity, [id number]- Neuromuscular re-education, [phone]- Self Care, [id number]- Manual therapy, [phone]- Gait training, [phone]- Canalith repositioning, [id number]- Electrical stimulation (unattended), [phone]- Electrical stimulation (manual), Patient/Family education, Balance training, Stair training, Taping, Dry Needling, Joint mobilization, Spinal mobilization, Vestibular training, DME instructions, Cryotherapy, and Moist heat  PLAN FOR NEXT SESSION:  Progress to LTGs.  Are there any water exercises she can do to help with BLE strength?  Pt to bring in her ankle brace for LLE.  Review HEP; ankle strength, quad strength, balance on uneven surfaces, improving balance confidence.  Outdoor gait on inclines   [employer], PT 04/23/23 11:51 AM Phone: [phone] Fax: [phone]  [facility] at [facility] [address] [place], [address] [place], [place] [address] Phone # [phone] Fax # [phone]

## 2023-04-24 DIAGNOSIS — E119 Type 2 diabetes mellitus without complications: Secondary | ICD-10-CM | POA: Diagnosis not present

## 2023-04-25 ENCOUNTER — Encounter: Payer: Self-pay | Admitting: Physical Therapy

## 2023-04-25 ENCOUNTER — Ambulatory Visit: Attending: Internal Medicine | Admitting: Physical Therapy

## 2023-04-25 DIAGNOSIS — M6281 Muscle weakness (generalized): Secondary | ICD-10-CM | POA: Diagnosis not present

## 2023-04-25 DIAGNOSIS — R2681 Unsteadiness on feet: Secondary | ICD-10-CM | POA: Diagnosis not present

## 2023-04-25 DIAGNOSIS — R2689 Other abnormalities of gait and mobility: Secondary | ICD-10-CM | POA: Diagnosis not present

## 2023-04-25 NOTE — Therapy (Signed)
 OUTPATIENT PHYSICAL THERAPY NEURO TREATMENT   Patient Name: Jodi Keller MRN: [id number] DOB:[birth year], 68 y.o., female Today's Date: 04/25/2023   PCP:    Corwin Levins, MD   REFERRING PROVIDER:    Corwin Levins, MD    END OF SESSION:  PT End of Session - 04/25/23 1532     Visit Number 8    Number of Visits 13    Date for PT Re-Evaluation 05/10/23    Authorization Type BCBS Medicare    PT Start Time 1533    PT Stop Time 1612    PT Time Calculation (min) 39 min    Activity Tolerance Patient tolerated treatment well   has some nausea   Behavior During Therapy [facility] for tasks assessed/performed                  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 Formatting of this note might be different from the original. Qualifier: Diagnosis of By: Briscoe Burns CMA, Alvy Beal Last Assessment & Plan: Formatting of this note might be different from the original. stable overall by history and exam, recent data reviewed with pt, and pt to continue medical treatment as before,  to f/u any worsening sympto   GERD (gastroesophageal reflux disease)    History of hiatal hernia    Hyperlipidemia 02/25/2011   Hypertension    Morbid obesity (HCC)    Sleep apnea    wears cpap   Uterine prolapse    Past Surgical History:  Procedure Laterality Date   ANKLE FUSION Right 2022   BALLOON DILATION N/A 12/29/2019   Procedure: BALLOON DILATION;  Surgeon: Hilarie Fredrickson, MD;  Location: [facility];  Service: Endoscopy;  Laterality: N/A;   CESAREAN SECTION     COLONOSCOPY WITH PROPOFOL N/A 12/29/2019   Procedure: COLONOSCOPY WITH PROPOFOL;  Surgeon: Hilarie Fredrickson, MD;  Location: [facility];  Service: Endoscopy;  Laterality:  N/A;   ESOPHAGOGASTRODUODENOSCOPY (EGD) WITH PROPOFOL N/A 12/29/2019   Procedure: ESOPHAGOGASTRODUODENOSCOPY (EGD) WITH PROPOFOL;  Surgeon: Hilarie Fredrickson, MD;  Location: [facility];  Service: Endoscopy;  Laterality: N/A;   EYE SURGERY     cataract surgery per left eye    FOOT SURGERY     HYSTEROSCOPY WITH D & C N/A 05/04/2022   Procedure: DILATATION AND CURETTAGE /HYSTEROSCOPY;  Surgeon: Silverio Lay, MD;  Location: [facility];  Service: Gynecology;  Laterality: N/A;   INTRAUTERINE DEVICE (IUD) INSERTION N/A 05/04/2022   Procedure: INTRAUTERINE DEVICE (IUD) INSERTION;  Surgeon: Silverio Lay, MD;  Location: [facility];  Service: Gynecology;  Laterality: N/A;   IUD REMOVAL N/A 05/04/2022   Procedure: INTRAUTERINE DEVICE (IUD) REMOVAL;  Surgeon: Silverio Lay, MD;  Location: [facility];  Service: Gynecology;  Laterality: N/A;   POLYPECTOMY N/A 05/04/2022   Procedure: POLYPECTOMY;  Surgeon: Silverio Lay, MD;  Location: [facility];  Service: Gynecology;  Laterality: N/A;   REVERSE SHOULDER ARTHROPLASTY Right 02/10/2022   Procedure: REVERSE SHOULDER ARTHROPLASTY;  Surgeon: Beverely Low, MD;  Location: [facility];  Service: Orthopedics;  Laterality: Right;  120 min choice and interscalene block   Patient Active Problem List   Diagnosis Date Noted   Paresthesias  09/23/2022   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   Pain in right hand 05/16/2022   Impairment of balance 04/20/2022   HTN (hypertension) 03/25/2022   Seborrheic keratoses 03/21/2022   S/P shoulder replacement, right 02/10/2022   Preop exam for internal medicine 10/25/2021   Rotator cuff arthropathy of right shoulder 10/19/2021   Encounter for well adult exam with abnormal findings 07/21/2021   Polycythemia vera (HCC) 06/15/2021   Impingement syndrome of left shoulder region 12/23/2020   CKD (chronic kidney disease) stage 3, GFR 30-59 ml/min (HCC) 12/14/2020   Herpes simplex  type 2 infection 05/12/2020   Midline cystocele 05/12/2020   Uterine prolapse 05/12/2020   At risk for sleep apnea 04/05/2020   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   Type 2 diabetes mellitus without complication, without long-term current use of insulin (HCC) 03/29/2020   Soft tissue mass 09/26/2018   Avascular necrosis of talus, left (HCC) 03/22/2016   Left rotator cuff tear 09/17/2015   Gait disorder 11/17/2014   Intertrigo 11/17/2014   Polycythemia 12/20/2011   Diabetes (HCC) 07/21/2011   Hyperlipidemia 02/25/2011   Degenerative joint disease of ankle, 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   Degenerative joint disease of ankle and foot 12/16/2010   Colon cancer screening 12/10/2010   Morbid obesity (HCC) 11/13/2006    ONSET DATE: 2022  REFERRING DIAG: R26.9 (ICD-10-CM) - Gait disorder  THERAPY DIAG:  Unsteadiness on feet  Other abnormalities of gait and mobility  Muscle weakness (generalized)  Rationale for Evaluation and Treatment: Rehabilitation  SUBJECTIVE:                                                                                                                                                                                             SUBJECTIVE STATEMENT: There is an issue that I have with sweeping and balance.  Will I ever be able to walk without the walker?  I'm so reliant on having something to hold onto.  Pt accompanied by: self  PERTINENT HISTORY: Anemia, DM, HTN, GERD, hiatal hernia, HLD, R ankle fusion 2022, R reverse TSA 2024  PAIN:  Are you having pain? No  PRECAUTIONS: Fall  RED FLAGS: None   WEIGHT BEARING RESTRICTIONS: No  FALLS: Has patient fallen in last 6 months? No  LIVING ENVIRONMENT: Lives with: lives alone Lives in: House/apartment Stairs:  no steps to enter 1 story apartment Has following equipment at home: Single point cane, Environmental consultant - 2 wheeled, Environmental consultant - 4 wheeled, and Grab bars  PLOF: Independent; currently unemployed-  looking for an office job; water fitness class 3x/week  PATIENT GOALS: improve balance   OBJECTIVE:  TODAY'S TREATMENT: 04/25/2023 Activity Comments  Sidestep/together R and L,2 x  10 reps 3# at ankles  Wide BOS lateral weightshift 10 reps x 2 Then progressed x 10 to reach to cabinet  Diagonal ant/post weightshift, 10 reps Then progressed x 10 to reach cabinet  Box step (4-square step) activity, 5 reps Progressed to light 1 UE support  Sit to stand from elevated mat Pt feels unsteady from higher height  Squat/sit to stand, 2 x 10 reps Pt feels quads with this ex    Access Code: [email or web address] URL: [email or web address] Date: 04/25/2023 Prepared by: [facility] - Outpatient  Rehab - [facility]  Program Notes perform at counter or corner for safety  Exercises - Standing Balance in Corner with Eyes Closed  - 1 x daily - 5 x weekly - 2 sets - 30 sec hold - Corner Balance Feet Together With Eyes Open  - 1 x daily - 5 x weekly - 2 sets - 30 sec hold - Standing Gastroc Stretch at Counter  - 1 x daily - 5 x weekly - 2 sets - 30 sec hold - Sit to Stand with Armchair  - 1 x daily - 5 x weekly - 2 sets - 10 reps - Side Stepping with Counter Support  - 1 x daily - 7 x weekly - 1-3 sets - 1-2 min rounds hold - Seated Toe Towel Scrunches  - 1 x daily - 7 x weekly - 1-3 sets - 10 reps - Seated Ankle Inversion with Anchored Resistance  - 1 x daily - 5 x weekly - 2 sets - 10 reps - Seated Ankle Eversion with Anchored Resistance  - 1 x daily - 5 x weekly - 2 sets - 10 reps - Staggered Stance Forward Backward Weight Shift with Counter Support  - 1 x daily - 7 x weekly - 2 sets - 10 reps - Side to side weightshift  - 1 x daily - 7 x weekly - 2 sets - 10 reps  PATIENT EDUCATION: Education details: Discussed ways she can incorporate strengthening and posture exercises in her pool program, updates to HEP to simulate motions needed for sweeping.  Discussed continuing to have some type of  support or sit to do this activity in regards to safety at this time. Person educated: Patient Education method: Explanation, Demonstration, and Verbal cues Education comprehension: verbalized understanding, returned demonstration, and verbal cues required     ------------------------------------------------- Note: Objective measures were completed at Evaluation unless otherwise noted.  DIAGNOSTIC FINDINGS: none recent   COGNITION: Overall cognitive status: Within functional limits for tasks assessed   SENSATION: Pt reports some diminished sensation in the R foot s/p surgery   POSTURE: rounded shoulders  LOWER EXTREMITY ROM:     Active  Right Eval Left Eval  Hip flexion    Hip extension    Hip abduction    Hip adduction    Hip internal rotation    Hip external rotation    Knee flexion    Knee extension    Ankle dorsiflexion 8 9  Ankle plantarflexion 0 30  Ankle inversion [facility] WFL  Ankle eversion WFL WFL   (Blank rows = not tested)  LOWER EXTREMITY MMT:    MMT (in sitting) Right Eval Left Eval  Hip flexion 4+ 4+  Hip extension    Hip abduction 4+ 4+  Hip adduction 4+ 4+  Hip internal rotation    Hip external rotation    Knee flexion 4+ 4  Knee extension 4 4  Ankle dorsiflexion 4- 4  Ankle plantarflexion 4 4  Ankle inversion 4 4  Ankle eversion 4- 4  (Blank rows = not tested)    GAIT: Gait pattern: forward trunk lean on walker, gait appears rushed d/t multiple quick but small steps; gait attempted without 4WW were not safe d/t imbalance  Assistive device utilized: Walker - 4 wheeled Level of assistance: Modified independence  FUNCTIONAL TESTS:        M-CTSIB  Condition 1: Firm Surface, EO 30 Sec, Normal Sway  Condition 2: Firm Surface, EC 6 Sec, Moderate and Severe Sway  Condition 3: Foam Surface, EO 12 Sec, Moderate and Severe Sway  Condition 4: Foam Surface, EC NT Sec,  NT  Sway                                                                                                                                  TREATMENT DATE: 03/29/23     GOALS: Goals reviewed with patient? Yes  SHORT TERM GOALS: Target date: 04/19/2023  Patient to be independent with initial HEP. Baseline: HEP reviewed with good form; reports not doing all of these Goal status: GOAL PARTIALLY MET    LONG TERM GOALS: Target date: 05/10/2023  Patient to be independent with advanced HEP. Baseline: Not yet initiated  Goal status: IN PROGRESS  Patient to demonstrate B LE strength >/=4+/5.  Baseline: See above Goal status: IN PROGRESS  Patient to maintain stability on M-CTSIB condition # 2 and 3 for 30 sec without LOB to improve safety in more challenging environments.  Baseline: 6 sec, 12 sec respectively  Goal status: IN PROGRESS  Patient to score at least 46/56 on Berg in order to decrease risk of falls.  Baseline: 42/56 Goal status: IN PROGRESS  Patient to demonstrate 5xSTS test in <15 sec without UE use in order to decrease risk of falls.  Baseline: 11 sec with B UE support  Goal status: IN PROGRESS  Patient to navigate steps with alternating reciprocal pattern with good stability.  Baseline: NT Goal status: IN PROGRESS  ASSESSMENT:  CLINICAL IMPRESSION: Pt presents today with reports of wanting to work on activities like sweeping and vaccuuming.  Currently these activities are limiting due to pt needing to hold to external support. Skilled PT session focused on stepping exercises and weightshifting + reaching exercises.  Pt needs light UE support and is able to progress to reaching out of BOS.  Worked on quad strengthening and discussed ways that she can work on hip strengthening in the pool, which may translate into better balance.  Pt will continue to benefit from skilled PT towards goals for improved functional mobility and decreased fall risk.   OBJECTIVE IMPAIRMENTS: Abnormal gait, decreased activity tolerance, decreased balance, difficulty  walking, decreased ROM, decreased strength, impaired flexibility, and postural dysfunction.   ACTIVITY LIMITATIONS: carrying, lifting, bending, sitting, standing, squatting, stairs, transfers, bed mobility, bathing, toileting,  dressing, hygiene/grooming, and locomotion level  PARTICIPATION LIMITATIONS: meal prep, cleaning, laundry, driving, shopping, community activity, and church  PERSONAL FACTORS: Age, Past/current experiences, Time since onset of injury/illness/exacerbation, and 3+ comorbidities: Anemia, DM, HTN, GERD, hiatal hernia, HLD, R ankle fusion 2022, R reverse TSA 2024  are also affecting patient's functional outcome.   REHAB POTENTIAL: Good  CLINICAL DECISION MAKING: Evolving/moderate complexity  EVALUATION COMPLEXITY: Moderate  PLAN:  PT FREQUENCY: 1-2x/week  PT DURATION: 6 weeks  PLANNED INTERVENTIONS: 97164- PT Re-evaluation, 97110-Therapeutic exercises, 97530- Therapeutic activity, [id number]- Neuromuscular re-education, 97535- Self Care, [id number]- Manual therapy, [id number]- Gait training, [id number]- Canalith repositioning, [id number]- Electrical stimulation (unattended), [phone]- Electrical stimulation (manual), Patient/Family education, Balance training, Stair training, Taping, Dry Needling, Joint mobilization, Spinal mobilization, Vestibular training, DME instructions, Cryotherapy, and Moist heat  PLAN FOR NEXT SESSION:  Progress to LTGs.  Trying gait with less UE support.  Ask about any water exercises she was able to do to help with BLE strength?  Pt to bring in her ankle brace for LLE.  Review HEP; ankle strength, quad strength, balance on uneven surfaces, improving balance confidence.  Outdoor gait on inclines   Lonia Blood, PT 04/25/23 4:14 PM Phone: [phone] Fax: [phone]  [facility] at [facility] [address] [place], [place] [address] Phone # [phone] Fax # [phone]

## 2023-04-30 ENCOUNTER — Encounter: Payer: Self-pay | Admitting: Physical Therapy

## 2023-04-30 ENCOUNTER — Ambulatory Visit: Admitting: Physical Therapy

## 2023-04-30 DIAGNOSIS — M6281 Muscle weakness (generalized): Secondary | ICD-10-CM | POA: Diagnosis not present

## 2023-04-30 DIAGNOSIS — R2689 Other abnormalities of gait and mobility: Secondary | ICD-10-CM

## 2023-04-30 DIAGNOSIS — R2681 Unsteadiness on feet: Secondary | ICD-10-CM | POA: Diagnosis not present

## 2023-04-30 NOTE — Therapy (Signed)
 OUTPATIENT PHYSICAL THERAPY NEURO TREATMENT   Patient Name: Jodi Keller MRN: [id number] DOB:[birth year], 67 y.o., female Today's Date: 04/30/2023   PCP:    Corwin Levins, MD   REFERRING PROVIDER:    Corwin Levins, MD    END OF SESSION:  PT End of Session - 04/30/23 1454     Visit Number 9    Number of Visits 13    Date for PT Re-Evaluation 05/10/23    Authorization Type BCBS Medicare    PT Start Time 1452    PT Stop Time 1530    PT Time Calculation (min) 38 min    Equipment Utilized During Treatment Gait belt    Activity Tolerance Patient tolerated treatment well    Behavior During Therapy [facility] for tasks assessed/performed                   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 Formatting of this note might be different from the original. Qualifier: Diagnosis of By: Briscoe Burns CMA, Alvy Beal Last Assessment & Plan: Formatting of this note might be different from the original. stable overall by history and exam, recent data reviewed with pt, and pt to continue medical treatment as before,  to f/u any worsening sympto   GERD (gastroesophageal reflux disease)    History of hiatal hernia    Hyperlipidemia 02/25/2011   Hypertension    Morbid obesity (HCC)    Sleep apnea    wears cpap   Uterine prolapse    Past Surgical History:  Procedure Laterality Date   ANKLE FUSION Right 2022   BALLOON DILATION N/A 12/29/2019   Procedure: BALLOON DILATION;  Surgeon: Hilarie Fredrickson, MD;  Location: [facility];  Service: Endoscopy;  Laterality: N/A;   CESAREAN SECTION     COLONOSCOPY WITH PROPOFOL N/A 12/29/2019   Procedure: COLONOSCOPY WITH PROPOFOL;  Surgeon: Hilarie Fredrickson, MD;  Location: [facility];  Service: Endoscopy;  Laterality: N/A;   ESOPHAGOGASTRODUODENOSCOPY (EGD) WITH PROPOFOL N/A 12/29/2019   Procedure: ESOPHAGOGASTRODUODENOSCOPY (EGD) WITH PROPOFOL;  Surgeon: Hilarie Fredrickson, MD;  Location: [facility];  Service: Endoscopy;  Laterality: N/A;   EYE SURGERY     cataract surgery per left eye    FOOT SURGERY     HYSTEROSCOPY WITH D & C N/A 05/04/2022   Procedure: DILATATION AND CURETTAGE /HYSTEROSCOPY;  Surgeon: Silverio Lay, MD;  Location: [facility];  Service: Gynecology;  Laterality: N/A;   INTRAUTERINE DEVICE (IUD) INSERTION N/A 05/04/2022   Procedure: INTRAUTERINE DEVICE (IUD) INSERTION;  Surgeon: Silverio Lay, MD;  Location: [facility] [place];  Service: Gynecology;  Laterality: N/A;   IUD REMOVAL N/A 05/04/2022   Procedure: INTRAUTERINE DEVICE (IUD) REMOVAL;  Surgeon: Silverio Lay, MD;  Location: [facility];  Service: Gynecology;  Laterality: N/A;   POLYPECTOMY N/A 05/04/2022   Procedure: POLYPECTOMY;  Surgeon: Silverio Lay, MD;  Location: [facility];  Service: Gynecology;  Laterality: N/A;   REVERSE SHOULDER ARTHROPLASTY Right 02/10/2022   Procedure: REVERSE SHOULDER ARTHROPLASTY;  Surgeon: Beverely Low, MD;  Location: [facility];  Service: Orthopedics;  Laterality: Right;  120 min choice and interscalene block   Patient Active Problem List  Diagnosis Date Noted   Paresthesias 09/23/2022   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   Pain in right hand 05/16/2022   Impairment of balance 04/20/2022   HTN (hypertension) 03/25/2022   Seborrheic keratoses 03/21/2022   S/P shoulder replacement, right 02/10/2022   Preop exam for internal medicine 10/25/2021   Rotator cuff arthropathy of right shoulder 10/19/2021   Encounter for well adult exam with abnormal findings 07/21/2021   Polycythemia vera (HCC) 06/15/2021   Impingement syndrome of left shoulder region 12/23/2020   CKD (chronic kidney disease) stage 3, GFR  30-59 ml/min (HCC) 12/14/2020   Herpes simplex type 2 infection 05/12/2020   Midline cystocele 05/12/2020   Uterine prolapse 05/12/2020   At risk for sleep apnea 04/05/2020   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   Type 2 diabetes mellitus without complication, without long-term current use of insulin (HCC) 03/29/2020   Soft tissue mass 09/26/2018   Avascular necrosis of talus, left (HCC) 03/22/2016   Left rotator cuff tear 09/17/2015   Gait disorder 11/17/2014   Intertrigo 11/17/2014   Polycythemia 12/20/2011   Diabetes (HCC) 07/21/2011   Hyperlipidemia 02/25/2011   Degenerative joint disease of ankle, 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   Degenerative joint disease of ankle and foot 12/16/2010   Colon cancer screening 12/10/2010   Morbid obesity (HCC) 11/13/2006    ONSET DATE: 2022  REFERRING DIAG: R26.9 (ICD-10-CM) - Gait disorder  THERAPY DIAG:  Unsteadiness on feet  Other abnormalities of gait and mobility  Muscle weakness (generalized)  Rationale for Evaluation and Treatment: Rehabilitation  SUBJECTIVE:                                                                                                                                                                                             SUBJECTIVE STATEMENT: Shoulder is twinge-ing today-don't know why.  Pt accompanied by: self  PERTINENT HISTORY: Anemia, DM, HTN, GERD, hiatal hernia, HLD, R ankle fusion 2022, R reverse TSA 2024  PAIN:  Are you having pain? Yes: NPRS scale: 3/10 Pain location: R shoulder Pain description: twinge Aggravating factors: not sure Relieving factors: just stops  PRECAUTIONS: Fall  RED FLAGS: None   WEIGHT BEARING RESTRICTIONS: No  FALLS: Has patient fallen in last 6 months? No  LIVING ENVIRONMENT: Lives with: lives alone Lives in: House/apartment Stairs:  no steps to enter 1 story apartment Has following equipment at home: Single point cane, Environmental consultant - 2 wheeled, Environmental consultant -  4 wheeled, and Grab bars  PLOF: Independent; currently unemployed- looking for an office job; water fitness class 3x/week  PATIENT GOALS: improve balance   OBJECTIVE:    TODAY'S  TREATMENT: 04/30/2023 Activity Comments  Reviewed weightshifting ex:  lateral wide BOS Stagger stance forward/back Good return demo, pt able to do with very light UE support  Four square step activity, 5 reps each direction, light UE support    Gait with rollator with less UE support   Short distance gait with no device, light UE support at counter 6 reps, reaching for counter, furniture  Forward/back walking 5 reps at counter 1 UE support, slowed pace backwards; close supervision  Squat to stand, BUE holding red therapy ball 2 x 10   Seated forward lean and lifting to shoulder height, red therapy ball x 10   Standing squat, holding 5# weight, 10 reps Standing squat to pick up cones, lateral weightshift to place on shelf Cues for wider BOS for improved weightshift    Access Code: [email or web address] URL: [email or web address] Date: 04/25/2023 Prepared by: [facility] - [facility]  Program Notes perform at counter or corner for safety  Exercises - Standing Balance in Corner with Eyes Closed  - 1 x daily - 5 x weekly - 2 sets - 30 sec hold - Corner Balance Feet Together With Eyes Open  - 1 x daily - 5 x weekly - 2 sets - 30 sec hold - Standing Gastroc Stretch at Counter  - 1 x daily - 5 x weekly - 2 sets - 30 sec hold - Sit to Stand with Armchair  - 1 x daily - 5 x weekly - 2 sets - 10 reps - Side Stepping with Counter Support  - 1 x daily - 7 x weekly - 1-3 sets - 1-2 min rounds hold - Seated Toe Towel Scrunches  - 1 x daily - 7 x weekly - 1-3 sets - 10 reps - Seated Ankle Inversion with Anchored Resistance  - 1 x daily - 5 x weekly - 2 sets - 10 reps - Seated Ankle Eversion with Anchored Resistance  - 1 x daily - 5 x weekly - 2 sets - 10 reps - Staggered Stance Forward Backward  Weight Shift with Counter Support  - 1 x daily - 7 x weekly - 2 sets - 10 reps - Side to side weightshift  - 1 x daily - 7 x weekly - 2 sets - 10 reps  PATIENT EDUCATION: Education details: 04/30/2023:  Discussed reaching and picking up objects from the floor.  Discussed safety with gait-given pt needing UE support for safety, discussed that rollator is likely the best option for safe gait to avoid falls when not using UE support; rationale for work on ankle/hip strategy for better balance Person educated: Patient Education method: Explanation, Demonstration, and Verbal cues Education comprehension: verbalized understanding, returned demonstration, and verbal cues required     ------------------------------------------------- Note: Objective measures were completed at Evaluation unless otherwise noted.  DIAGNOSTIC FINDINGS: none recent   COGNITION: Overall cognitive status: Within functional limits for tasks assessed   SENSATION: Pt reports some diminished sensation in the R foot s/p surgery   POSTURE: rounded shoulders  LOWER EXTREMITY ROM:     Active  Right Eval Left Eval  Hip flexion    Hip extension    Hip abduction    Hip adduction    Hip internal rotation    Hip external rotation    Knee flexion    Knee extension    Ankle dorsiflexion 8 9  Ankle plantarflexion 0 30  Ankle inversion [facility]  Ankle eversion WFL WFL   (Blank  rows = not tested)  LOWER EXTREMITY MMT:    MMT (in sitting) Right Eval Left Eval  Hip flexion 4+ 4+  Hip extension    Hip abduction 4+ 4+  Hip adduction 4+ 4+  Hip internal rotation    Hip external rotation    Knee flexion 4+ 4  Knee extension 4 4  Ankle dorsiflexion 4- 4  Ankle plantarflexion 4 4  Ankle inversion 4 4  Ankle eversion 4- 4  (Blank rows = not tested)    GAIT: Gait pattern: forward trunk lean on walker, gait appears rushed d/t multiple quick but small steps; gait attempted without 4WW were not safe d/t imbalance   Assistive device utilized: Walker - 4 wheeled Level of assistance: Modified independence  FUNCTIONAL TESTS:        M-CTSIB  Condition 1: Firm Surface, EO 30 Sec, Normal Sway  Condition 2: Firm Surface, EC 6 Sec, Moderate and Severe Sway  Condition 3: Foam Surface, EO 12 Sec, Moderate and Severe Sway  Condition 4: Foam Surface, EC NT Sec,  NT  Sway                                                                                                                                 TREATMENT DATE: 03/29/23     GOALS: Goals reviewed with patient? Yes  SHORT TERM GOALS: Target date: 04/19/2023  Patient to be independent with initial HEP. Baseline: HEP reviewed with good form; reports not doing all of these Goal status: GOAL PARTIALLY MET    LONG TERM GOALS: Target date: 05/10/2023  Patient to be independent with advanced HEP. Baseline: Not yet initiated  Goal status: IN PROGRESS  Patient to demonstrate B LE strength >/=4+/5.  Baseline: See above Goal status: IN PROGRESS  Patient to maintain stability on M-CTSIB condition # 2 and 3 for 30 sec without LOB to improve safety in more challenging environments.  Baseline: 6 sec, 12 sec respectively  Goal status: IN PROGRESS  Patient to score at least 46/56 on Berg in order to decrease risk of falls.  Baseline: 42/56 Goal status: IN PROGRESS  Patient to demonstrate 5xSTS test in <15 sec without UE use in order to decrease risk of falls.  Baseline: 11 sec with B UE support  Goal status: IN PROGRESS  Patient to navigate steps with alternating reciprocal pattern with good stability.  Baseline: NT Goal status: IN PROGRESS  ASSESSMENT:  CLINICAL IMPRESSION: Pt presents today with no new complaints. Skilled PT session focused on pt's discussion on ways to be more successful with reaching and squatting tasks.  Pt trials several bouts of gait without device, reaching out for furniture and moving quite quickly.   Discussed that her  safest option for gait is still the rollator.  Practiced squatting, reaching activities for functional strengthening.  Pt tolerates session well.  Pt will continue to benefit from skilled PT towards goals for improved functional mobility  and decreased fall risk.   OBJECTIVE IMPAIRMENTS: Abnormal gait, decreased activity tolerance, decreased balance, difficulty walking, decreased ROM, decreased strength, impaired flexibility, and postural dysfunction.   ACTIVITY LIMITATIONS: carrying, lifting, bending, sitting, standing, squatting, stairs, transfers, bed mobility, bathing, toileting, dressing, hygiene/grooming, and locomotion level  PARTICIPATION LIMITATIONS: meal prep, cleaning, laundry, driving, shopping, community activity, and church  PERSONAL FACTORS: Age, Past/current experiences, Time since onset of injury/illness/exacerbation, and 3+ comorbidities: Anemia, DM, HTN, GERD, hiatal hernia, HLD, R ankle fusion 2022, R reverse TSA 2024  are also affecting patient's functional outcome.   REHAB POTENTIAL: Good  CLINICAL DECISION MAKING: Evolving/moderate complexity  EVALUATION COMPLEXITY: Moderate  PLAN:  PT FREQUENCY: 1-2x/week  PT DURATION: 6 weeks  PLANNED INTERVENTIONS: 97164- PT Re-evaluation, 97110-Therapeutic exercises, 97530- Therapeutic activity, [id number]- Neuromuscular re-education, 97535- Self Care, 16109- Manual therapy, [id number]- Gait training, [phone]- Canalith repositioning, [id number]- Electrical stimulation (unattended), [phone]- Electrical stimulation (manual), Patient/Family education, Balance training, Stair training, Taping, Dry Needling, Joint mobilization, Spinal mobilization, Vestibular training, DME instructions, Cryotherapy, and Moist heat  PLAN FOR NEXT SESSION:  Progress to LTGs.  Trying gait with less UE support.  Ask about any water exercises she was able to do to help with BLE strength?  Pt to bring in her ankle brace for LLE.  Review HEP; ankle strength, quad strength, balance  on uneven surfaces, improving balance confidence.  Outdoor gait on inclines   Lonia Blood, PT 04/30/23 7:18 PM Phone: [phone] Fax: [phone]  [facility] at [facility] [address], [address] [place], [place] [address] Phone # [phone] Fax # [phone]

## 2023-05-02 ENCOUNTER — Ambulatory Visit: Admitting: Physical Therapy

## 2023-05-02 ENCOUNTER — Encounter: Payer: Self-pay | Admitting: Physical Therapy

## 2023-05-02 DIAGNOSIS — R2681 Unsteadiness on feet: Secondary | ICD-10-CM

## 2023-05-02 DIAGNOSIS — M6281 Muscle weakness (generalized): Secondary | ICD-10-CM | POA: Diagnosis not present

## 2023-05-02 DIAGNOSIS — R2689 Other abnormalities of gait and mobility: Secondary | ICD-10-CM

## 2023-05-02 NOTE — Therapy (Signed)
 OUTPATIENT PHYSICAL THERAPY NEURO TREATMENT/10th VISIT PROGRESS NOTE   Patient Name: Jodi Keller MRN: [id number] DOB:16-Feb-1955, 68 y.o., female Today's Date: 05/03/2023   PCP:    Corwin Levins, MD   REFERRING PROVIDER:    Corwin Levins, MD   Progress Note Reporting Period 03/29/2023 to 05/02/2023  See note below for Objective Data and Assessment of Progress/Goals.      END OF SESSION:  PT End of Session - 05/02/23 1109     Visit Number 10    Number of Visits 13    Date for PT Re-Evaluation 05/10/23    Authorization Type BCBS Medicare    PT Start Time 1106    PT Stop Time 1145    PT Time Calculation (min) 39 min    Equipment Utilized During Treatment Gait belt    Activity Tolerance Patient tolerated treatment well    Behavior During Therapy WFL for tasks assessed/performed                    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 Formatting of this note might be different from the original. Qualifier: Diagnosis of By: Briscoe Burns CMA, Alvy Beal Last Assessment & Plan: Formatting of this note might be different from the original. stable overall by history and exam, recent data reviewed with pt, and pt to continue medical treatment as before,  to f/u any worsening sympto   GERD (gastroesophageal reflux disease)    History of hiatal hernia    Hyperlipidemia 02/25/2011   Hypertension    Morbid obesity (HCC)    Sleep apnea    wears cpap   Uterine prolapse    Past Surgical History:  Procedure Laterality Date   ANKLE FUSION Right 2022   BALLOON DILATION N/A 12/29/2019   Procedure: BALLOON DILATION;  Surgeon: Hilarie Fredrickson, MD;  Location: [facility];  Service: Endoscopy;  Laterality: N/A;    CESAREAN SECTION     COLONOSCOPY WITH PROPOFOL N/A 12/29/2019   Procedure: COLONOSCOPY WITH PROPOFOL;  Surgeon: Hilarie Fredrickson, MD;  Location: [facility];  Service: Endoscopy;  Laterality: N/A;   ESOPHAGOGASTRODUODENOSCOPY (EGD) WITH PROPOFOL N/A 12/29/2019   Procedure: ESOPHAGOGASTRODUODENOSCOPY (EGD) WITH PROPOFOL;  Surgeon: Hilarie Fredrickson, MD;  Location: [facility];  Service: Endoscopy;  Laterality: N/A;   EYE SURGERY     cataract surgery per left eye    FOOT SURGERY     HYSTEROSCOPY WITH D & C N/A 05/04/2022   Procedure: DILATATION AND CURETTAGE /HYSTEROSCOPY;  Surgeon: Silverio Lay, MD;  Location: [facility];  Service: Gynecology;  Laterality: N/A;   INTRAUTERINE DEVICE (IUD) INSERTION N/A 05/04/2022   Procedure: INTRAUTERINE DEVICE (IUD) INSERTION;  Surgeon: Silverio Lay, MD;  Location: [facility];  Service: Gynecology;  Laterality: N/A;   IUD REMOVAL N/A 05/04/2022   Procedure: INTRAUTERINE DEVICE (IUD) REMOVAL;  Surgeon: Silverio Lay, MD;  Location: [facility] ;  Service: Gynecology;  Laterality: N/A;   POLYPECTOMY N/A 05/04/2022   Procedure: POLYPECTOMY;  Surgeon: Silverio Lay, MD;  Location: [facility];  Service: Gynecology;  Laterality: N/A;   REVERSE SHOULDER ARTHROPLASTY Right 02/10/2022   Procedure: REVERSE SHOULDER ARTHROPLASTY;  Surgeon: Beverely Low,  MD;  Location: [facility];  Service: Orthopedics;  Laterality: Right;  120 min choice and interscalene block   Patient Active Problem List   Diagnosis Date Noted   Paresthesias 09/23/2022   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   Pain in right hand 05/16/2022   Impairment of balance 04/20/2022   HTN (hypertension) 03/25/2022   Seborrheic keratoses 03/21/2022   S/P shoulder replacement, right 02/10/2022   Preop exam for internal medicine 10/25/2021   Rotator cuff arthropathy of right shoulder 10/19/2021   Encounter for well adult exam with abnormal findings  07/21/2021   Polycythemia vera (HCC) 06/15/2021   Impingement syndrome of left shoulder region 12/23/2020   CKD (chronic kidney disease) stage 3, GFR 30-59 ml/min (HCC) 12/14/2020   Herpes simplex type 2 infection 05/12/2020   Midline cystocele 05/12/2020   Uterine prolapse 05/12/2020   At risk for sleep apnea 04/05/2020   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   Type 2 diabetes mellitus without complication, without long-term current use of insulin (HCC) 03/29/2020   Soft tissue mass 09/26/2018   Avascular necrosis of talus, left (HCC) 03/22/2016   Left rotator cuff tear 09/17/2015   Gait disorder 11/17/2014   Intertrigo 11/17/2014   Polycythemia 12/20/2011   Diabetes (HCC) 07/21/2011   Hyperlipidemia 02/25/2011   Degenerative joint disease of ankle, 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   Degenerative joint disease of ankle and foot 12/16/2010   Colon cancer screening 12/10/2010   Morbid obesity (HCC) 11/13/2006    ONSET DATE: 2022  REFERRING DIAG: R26.9 (ICD-10-CM) - Gait disorder  THERAPY DIAG:  Unsteadiness on feet  Muscle weakness (generalized)  Other abnormalities of gait and mobility  Rationale for Evaluation and Treatment: Rehabilitation  SUBJECTIVE:                                                                                                                                                                                             SUBJECTIVE STATEMENT: The main thing that is helping is my walking is better.  I did something to my shoulder and it is really hurting.  Plan to see MD that did my surgery, next week.  Pt accompanied by: self  PERTINENT HISTORY: Anemia, DM, HTN, GERD, hiatal hernia, HLD, R ankle fusion 2022, R reverse TSA 2024  PAIN:  Are you having pain? Yes: NPRS scale: 9-10/10 Pain location: R shoulder Pain description: sharp Aggravating factors: funny way to get into bed yesterday Relieving factors: just stops *called Dr. Ranell Patrick and have  appt scheduled 4/15 to check R shoulder (post-replacement) PRECAUTIONS: Fall  RED FLAGS: None   WEIGHT BEARING RESTRICTIONS: No  FALLS: Has  patient fallen in last 6 months? No  LIVING ENVIRONMENT: Lives with: lives alone Lives in: House/apartment Stairs:  no steps to enter 1 story apartment Has following equipment at home: Single point cane, Environmental consultant - 2 wheeled, Environmental consultant - 4 wheeled, and Grab bars  PLOF: Independent; currently unemployed- looking for an office job; water fitness class 3x/week  PATIENT GOALS: improve balance   OBJECTIVE:    TODAY'S TREATMENT: 05/02/2023 Activity Comments  FTSTS: 17.22 sec no UE support 12.5 sec with light UE support   EO and EC feet together 30 sec Has to have intermittent UE support with EC  Short distance gait with no device, light to no UE support at parallel bars   Forward/back walking in parallel bars Light to no UE support, good ability shifting weight to increased foot clearance  Stair negotiation-step- pattern ascending and descending, 4 reps She leads up with LLE and down with RLE  Reviewed ankle exercises as part of HEP Cues to avoid excess hip motion  Ankle eversion/inversion for L ankle using pillow case for AROM Cues to avoid excess hip motion  Isometric ankle eversion against ball, 3 sec, 5 reps Pt likes this exercise    HOME EXERCISE PROGRAM:  Access Code: [email or web address] URL: [email or web address] Date: 05/02/2023 Prepared by: [facility] - Outpatient  Rehab - [facility]  Program Notes perform at counter or corner for safety  Exercises - Standing Balance in Corner with Eyes Closed  - 1 x daily - 5 x weekly - 2 sets - 30 sec hold - Corner Balance Feet Together With Eyes Open  - 1 x daily - 5 x weekly - 2 sets - 30 sec hold - Standing Gastroc Stretch at Counter  - 1 x daily - 5 x weekly - 2 sets - 30 sec hold - Sit to Stand with Armchair  - 1 x daily - 5 x weekly - 2 sets - 10 reps - Side Stepping with Counter  Support  - 1 x daily - 7 x weekly - 1-3 sets - 1-2 min rounds hold - Seated Toe Towel Scrunches  - 1 x daily - 7 x weekly - 1-3 sets - 10 reps - Seated Ankle Inversion with Anchored Resistance  - 1 x daily - 5 x weekly - 2 sets - 10 reps - Seated Ankle Eversion with Anchored Resistance  - 1 x daily - 5 x weekly - 2 sets - 10 reps - Staggered Stance Forward Backward Weight Shift with Counter Support  - 1 x daily - 7 x weekly - 2 sets - 10 reps - Side to side weightshift  - 1 x daily - 7 x weekly - 2 sets - 10 reps - Isometric Ankle Eversion at Wall  - 1 x daily - 4 x weekly - 3 sets - 5 reps - 3 sec hold  PATIENT EDUCATION: Education details: 05/02/2023:  Fall prevention, progress towards goals, udpate to HEP Person educated: Patient Education method: Explanation, Demonstration, and Verbal cues Education comprehension: verbalized understanding, returned demonstration, and verbal cues required     ------------------------------------------------- Note: Objective measures were completed at Evaluation unless otherwise noted.  DIAGNOSTIC FINDINGS: none recent   COGNITION: Overall cognitive status: Within functional limits for tasks assessed   SENSATION: Pt reports some diminished sensation in the R foot s/p surgery   POSTURE: rounded shoulders  LOWER EXTREMITY ROM:     Active  Right Eval Left Eval  Hip flexion    Hip extension  Hip abduction    Hip adduction    Hip internal rotation    Hip external rotation    Knee flexion    Knee extension    Ankle dorsiflexion 8 9  Ankle plantarflexion 0 30  Ankle inversion [facility] WFL  Ankle eversion WFL WFL   (Blank rows = not tested)  LOWER EXTREMITY MMT:    MMT (in sitting) Right Eval Left Eval  Hip flexion 4+ 4+  Hip extension    Hip abduction 4+ 4+  Hip adduction 4+ 4+  Hip internal rotation    Hip external rotation    Knee flexion 4+ 4  Knee extension 4 4  Ankle dorsiflexion 4- 4  Ankle plantarflexion 4 4  Ankle  inversion 4 4  Ankle eversion 4- 4  (Blank rows = not tested)    GAIT: Gait pattern: forward trunk lean on walker, gait appears rushed d/t multiple quick but small steps; gait attempted without 4WW were not safe d/t imbalance  Assistive device utilized: Walker - 4 wheeled Level of assistance: Modified independence  FUNCTIONAL TESTS:        M-CTSIB  Condition 1: Firm Surface, EO 30 Sec, Normal Sway  Condition 2: Firm Surface, EC 6 Sec, Moderate and Severe Sway  Condition 3: Foam Surface, EO 12 Sec, Moderate and Severe Sway  Condition 4: Foam Surface, EC NT Sec,  NT  Sway                                                                                                                                 TREATMENT DATE: 03/29/23     GOALS: Goals reviewed with patient? Yes  SHORT TERM GOALS: Target date: 04/19/2023  Patient to be independent with initial HEP. Baseline: HEP reviewed with good form; reports not doing all of these Goal status: GOAL PARTIALLY MET    LONG TERM GOALS: Target date: 05/10/2023  Patient to be independent with advanced HEP. Baseline: Not yet initiated  Goal status: IN PROGRESS  Patient to demonstrate B LE strength >/=4+/5.  Baseline: See above Goal status: IN PROGRESS  Patient to maintain stability on M-CTSIB condition # 2 and 3 for 30 sec without LOB to improve safety in more challenging environments.  Baseline: 6 sec, 12 sec respectively; 05/02/2023-unable to hold without UE support Goal status: IN PROGRESS  Patient to score at least 46/56 on Berg in order to decrease risk of falls.  Baseline: 42/56 Goal status: IN PROGRESS  Patient to demonstrate 5xSTS test in <15 sec without UE use in order to decrease risk of falls.  Baseline: 11 sec with B UE support ; 17.22 sec without UE support 05/02/2023 Goal status: IN PROGRESS, 05/02/2023  Patient to navigate steps with alternating reciprocal pattern with good stability.  Baseline: step-to pattern, mod  I 05/02/2023 (pt reports she will not perform step through due to ankle limitations/fear of falling) Goal status: DEFERRED  ASSESSMENT:  CLINICAL IMPRESSION:  10th VISIT NOTE:  Pt reports improvement in her walking.  Pt presents today and wants to try walking in parallel bars without UE support, which she is able to do multiple reps with supervision. Skilled PT session focused on balance activity plus review and progression of ankle exercises. Pt needs UE support today for any activity with eyes closed.  She is able to demo improved weightshifting and improved balance confidence, however, due to instability and weakness at ankles, she still does benefit from use of UE support for rollator with gait.   Pt will continue to benefit from skilled PT towards goals for improved functional mobility and decreased fall risk. Anticipate plans for discharge next week.  OBJECTIVE IMPAIRMENTS: Abnormal gait, decreased activity tolerance, decreased balance, difficulty walking, decreased ROM, decreased strength, impaired flexibility, and postural dysfunction.   ACTIVITY LIMITATIONS: carrying, lifting, bending, sitting, standing, squatting, stairs, transfers, bed mobility, bathing, toileting, dressing, hygiene/grooming, and locomotion level  PARTICIPATION LIMITATIONS: meal prep, cleaning, laundry, driving, shopping, community activity, and church  PERSONAL FACTORS: Age, Past/current experiences, Time since onset of injury/illness/exacerbation, and 3+ comorbidities: Anemia, DM, HTN, GERD, hiatal hernia, HLD, R ankle fusion 2022, R reverse TSA 2024  are also affecting patient's functional outcome.   REHAB POTENTIAL: Good  CLINICAL DECISION MAKING: Evolving/moderate complexity  EVALUATION COMPLEXITY: Moderate  PLAN:  PT FREQUENCY: 1-2x/week  PT DURATION: 6 weeks  PLANNED INTERVENTIONS: 97164- PT Re-evaluation, 97110-Therapeutic exercises, 97530- Therapeutic activity, [id number]- Neuromuscular re-education, 97535-  Self Care, 09811- Manual therapy, [id number]- Gait training, [phone]- Canalith repositioning, G9562- Electrical stimulation (unattended), [phone]- Electrical stimulation (manual), Patient/Family education, Balance training, Stair training, Taping, Dry Needling, Joint mobilization, Spinal mobilization, Vestibular training, DME instructions, Cryotherapy, and Moist heat  PLAN FOR NEXT SESSION:  Discuss POC and discuss possible discharge.  Review any updates to HEP.  Balance/gait on inclines   Lonia Blood, PT 05/03/23 1:04 PM Phone: [phone] Fax: [phone]  [facility] at [facility] [address], [address] [place], [place] [address] Phone # [phone] Fax # [phone]

## 2023-05-04 NOTE — Therapy (Signed)
 OUTPATIENT PHYSICAL THERAPY NEURO TREATMENT   Patient Name: Jodi Keller MRN: [id number] DOB:06-Jul-1955, 68 y.o., female Today's Date: 05/07/2023   PCP:    Roslyn Coombe, MD   REFERRING PROVIDER:    Roslyn Coombe, MD       END OF SESSION:  PT End of Session - 05/07/23 1102     Visit Number 11    Number of Visits 13    Date for PT Re-Evaluation 05/10/23    Authorization Type BCBS Medicare    PT Start Time 1019    PT Stop Time 1101    PT Time Calculation (min) 42 min    Equipment Utilized During Treatment Gait belt    Activity Tolerance Patient tolerated treatment well    Behavior During Therapy WFL for tasks assessed/performed                     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 Formatting of this note might be different from the original. Qualifier: Diagnosis of By: Foy Imam CMA, Nettie Barb Last Assessment & Plan: Formatting of this note might be different from the original. stable overall by history and exam, recent data reviewed with pt, and pt to continue medical treatment as before,  to f/u any worsening sympto   GERD (gastroesophageal reflux disease)    History of hiatal hernia    Hyperlipidemia 02/25/2011   Hypertension    Morbid obesity (HCC)    Sleep apnea    wears cpap   Uterine prolapse    Past Surgical History:  Procedure Laterality Date   ANKLE FUSION Right 2022   BALLOON DILATION N/A 12/29/2019   Procedure: BALLOON DILATION;  Surgeon: Tobin Forts, MD;  Location: [facility];  Service: Endoscopy;  Laterality: N/A;   CESAREAN SECTION     COLONOSCOPY WITH PROPOFOL N/A 12/29/2019   Procedure: COLONOSCOPY WITH PROPOFOL;  Surgeon: Tobin Forts, MD;  Location:  [facility];  Service: Endoscopy;  Laterality: N/A;   ESOPHAGOGASTRODUODENOSCOPY (EGD) WITH PROPOFOL N/A 12/29/2019   Procedure: ESOPHAGOGASTRODUODENOSCOPY (EGD) WITH PROPOFOL;  Surgeon: Tobin Forts, MD;  Location: [facility];  Service: Endoscopy;  Laterality: N/A;   EYE SURGERY     cataract surgery per left eye    FOOT SURGERY     HYSTEROSCOPY WITH D & C N/A 05/04/2022   Procedure: DILATATION AND CURETTAGE /HYSTEROSCOPY;  Surgeon: Ona Bidding, MD;  Location: [facility];  Service: Gynecology;  Laterality: N/A;   INTRAUTERINE DEVICE (IUD) INSERTION N/A 05/04/2022   Procedure: INTRAUTERINE DEVICE (IUD) INSERTION;  Surgeon: Ona Bidding, MD;  Location: [facility];  Service: Gynecology;  Laterality: N/A;   IUD REMOVAL N/A 05/04/2022   Procedure: INTRAUTERINE DEVICE (IUD) REMOVAL;  Surgeon: Ona Bidding, MD;  Location: [facility];  Service: Gynecology;  Laterality: N/A;   POLYPECTOMY N/A 05/04/2022   Procedure: POLYPECTOMY;  Surgeon: Ona Bidding, MD;  Location: [facility];  Service: Gynecology;  Laterality: N/A;   REVERSE SHOULDER ARTHROPLASTY Right 02/10/2022   Procedure: REVERSE SHOULDER ARTHROPLASTY;  Surgeon: Winston Hawking, MD;  Location: [facility];  Service: Orthopedics;  Laterality: Right;  120 min choice and interscalene block   Patient  Active Problem List   Diagnosis Date Noted   Paresthesias 09/23/2022   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   Pain in right hand 05/16/2022   Impairment of balance 04/20/2022   HTN (hypertension) 03/25/2022   Seborrheic keratoses 03/21/2022   S/P shoulder replacement, right 02/10/2022   Preop exam for internal medicine 10/25/2021   Rotator cuff arthropathy of right shoulder 10/19/2021   Encounter for well adult exam with abnormal findings 07/21/2021   Polycythemia vera (HCC) 06/15/2021   Impingement syndrome of left shoulder region 12/23/2020   CKD (chronic kidney disease) stage 3, GFR  30-59 ml/min (HCC) 12/14/2020   Herpes simplex type 2 infection 05/12/2020   Midline cystocele 05/12/2020   Uterine prolapse 05/12/2020   At risk for sleep apnea 04/05/2020   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   Type 2 diabetes mellitus without complication, without long-term current use of insulin (HCC) 03/29/2020   Soft tissue mass 09/26/2018   Avascular necrosis of talus, left (HCC) 03/22/2016   Left rotator cuff tear 09/17/2015   Gait disorder 11/17/2014   Intertrigo 11/17/2014   Polycythemia 12/20/2011   Diabetes (HCC) 07/21/2011   Hyperlipidemia 02/25/2011   Degenerative joint disease of ankle, 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   Degenerative joint disease of ankle and foot 12/16/2010   Colon cancer screening 12/10/2010   Morbid obesity (HCC) 11/13/2006    ONSET DATE: 2022  REFERRING DIAG: R26.9 (ICD-10-CM) - Gait disorder  THERAPY DIAG:  Unsteadiness on feet  Muscle weakness (generalized)  Other abnormalities of gait and mobility  Rationale for Evaluation and Treatment: Rehabilitation  SUBJECTIVE:                                                                                                                                                                                             SUBJECTIVE STATEMENT: Has an appointment with Dr. Brunilda Capra tomorrow for the shoulder. Reports that she had a cramp in the R foot yesterday. Reports that she is ready for DC next session.   Pt accompanied by: self  PERTINENT HISTORY: Anemia, DM, HTN, GERD, hiatal hernia, HLD, R ankle fusion 2022, R reverse TSA 2024  PAIN:  Are you having pain? Yes: NPRS scale: 0/10 Pain location: R shoulder Pain description: sharp Aggravating factors: funny way to get into bed yesterday Relieving factors: just stops *called Dr. Brunilda Capra and have appt scheduled 4/15 to check R shoulder (post-replacement) PRECAUTIONS: Fall  RED FLAGS: None   WEIGHT BEARING RESTRICTIONS: No  FALLS: Has patient  fallen in last 6 months? No  LIVING ENVIRONMENT: Lives with: lives alone Lives in: House/apartment Stairs:  no steps to enter 1  story apartment Has following equipment at home: Single point cane, Environmental consultant - 2 wheeled, Environmental consultant - 4 wheeled, and Grab bars  PLOF: Independent; currently unemployed- looking for an office job; water fitness class 3x/week  PATIENT GOALS: improve balance   OBJECTIVE:     TODAY'S TREATMENT: 05/07/23 Activity Comments  standing ant/pos wt shift standing R/L wt shift Cuing to push great toe down firmly to reduce rolling into inversion. Had to take a break d/t R toes cramping  R foot self-massage with tennis ball To address R foot cramp  standing on foam: static stance ant/pos wt shift L/R wt shift  Counter support; continued cues for foot position   Sidestepping over cone B UE support on counter          PATIENT EDUCATION: Education details: detailed review and consolidation of HEP; encouraged lace up ankle brace for improved stability  Person educated: Patient Education method: Explanation, Demonstration, Tactile cues, Verbal cues, and Handouts Education comprehension: verbalized understanding and returned demonstration   HOME EXERCISE PROGRAM Last updated: 05/07/23 Access Code: [email or web address] URL: [email or web address] Date: 05/07/2023 Prepared by: [facility] - Outpatient  Rehab - [facility]  Program Notes perform at counter or corner for safety  Exercises - Standing Balance in Corner with Eyes Closed  - 1 x daily - 5 x weekly - 2 sets - 30 sec hold - Corner Balance Feet Together With Eyes Open  - 1 x daily - 5 x weekly - 2 sets - 30 sec hold - Staggered Stance Forward Backward Weight Shift with Counter Support  - 1 x daily - 7 x weekly - 2 sets - 10 reps - Side to side weightshift  - 1 x daily - 7 x weekly - 2 sets - 10 reps - Sit to Stand Without Arm Support  - 1 x daily - 5 x weekly - 2 sets - 10 reps - Seated Toe Towel Scrunches   - 1 x daily - 7 x weekly - 1-3 sets - 10 reps - Isometric Ankle Eversion at Wall  - 1 x daily - 4 x weekly - 3 sets - 5 reps - 3 sec hold   ------------------------------------------------- Note: Objective measures were completed at Evaluation unless otherwise noted.  DIAGNOSTIC FINDINGS: none recent   COGNITION: Overall cognitive status: Within functional limits for tasks assessed   SENSATION: Pt reports some diminished sensation in the R foot s/p surgery   POSTURE: rounded shoulders  LOWER EXTREMITY ROM:     Active  Right Eval Left Eval  Hip flexion    Hip extension    Hip abduction    Hip adduction    Hip internal rotation    Hip external rotation    Knee flexion    Knee extension    Ankle dorsiflexion 8 9  Ankle plantarflexion 0 30  Ankle inversion [facility] WFL  Ankle eversion WFL WFL   (Blank rows = not tested)  LOWER EXTREMITY MMT:    MMT (in sitting) Right Eval Left Eval  Hip flexion 4+ 4+  Hip extension    Hip abduction 4+ 4+  Hip adduction 4+ 4+  Hip internal rotation    Hip external rotation    Knee flexion 4+ 4  Knee extension 4 4  Ankle dorsiflexion 4- 4  Ankle plantarflexion 4 4  Ankle inversion 4 4  Ankle eversion 4- 4  (Blank rows = not tested)    GAIT: Gait pattern: forward trunk lean on walker, gait appears rushed d/t  multiple quick but small steps; gait attempted without 4WW were not safe d/t imbalance  Assistive device utilized: Walker - 4 wheeled Level of assistance: Modified independence  FUNCTIONAL TESTS:        M-CTSIB  Condition 1: Firm Surface, EO 30 Sec, Normal Sway  Condition 2: Firm Surface, EC 6 Sec, Moderate and Severe Sway  Condition 3: Foam Surface, EO 12 Sec, Moderate and Severe Sway  Condition 4: Foam Surface, EC NT Sec,  NT  Sway                                                                                                                                 TREATMENT DATE: 03/29/23     GOALS: Goals reviewed  with patient? Yes  SHORT TERM GOALS: Target date: 04/19/2023  Patient to be independent with initial HEP. Baseline: HEP reviewed with good form; reports not doing all of these Goal status: GOAL PARTIALLY MET    LONG TERM GOALS: Target date: 05/10/2023  Patient to be independent with advanced HEP. Baseline: Not yet initiated  Goal status: IN PROGRESS  Patient to demonstrate B LE strength >/=4+/5.  Baseline: See above Goal status: IN PROGRESS  Patient to maintain stability on M-CTSIB condition # 2 and 3 for 30 sec without LOB to improve safety in more challenging environments.  Baseline: 6 sec, 12 sec respectively; 05/02/2023-unable to hold without UE support Goal status: IN PROGRESS  Patient to score at least 46/56 on Berg in order to decrease risk of falls.  Baseline: 42/56 Goal status: IN PROGRESS  Patient to demonstrate 5xSTS test in <15 sec without UE use in order to decrease risk of falls.  Baseline: 11 sec with B UE support ; 17.22 sec without UE support 05/02/2023 Goal status: IN PROGRESS, 05/02/2023  Patient to navigate steps with alternating reciprocal pattern with good stability.  Baseline: step-to pattern, mod I 05/02/2023 (pt reports she will not perform step through due to ankle limitations/fear of falling) Goal status: DEFERRED  ASSESSMENT:  CLINICAL IMPRESSION: Patient arrived to session with report of appointment for her R shoulder tomorrow. Reports plans to wrap up with PT next session. HEP was reviewed and consolidation for max benefit. Worked on weight shifting activities with verbal cueing to address  tendency for L ankle to fall into inversion. Continued reliance on UE support required with balance activities. No complaints upon leaving.   OBJECTIVE IMPAIRMENTS: Abnormal gait, decreased activity tolerance, decreased balance, difficulty walking, decreased ROM, decreased strength, impaired flexibility, and postural dysfunction.   ACTIVITY LIMITATIONS: carrying,  lifting, bending, sitting, standing, squatting, stairs, transfers, bed mobility, bathing, toileting, dressing, hygiene/grooming, and locomotion level  PARTICIPATION LIMITATIONS: meal prep, cleaning, laundry, driving, shopping, community activity, and church  PERSONAL FACTORS: Age, Past/current experiences, Time since onset of injury/illness/exacerbation, and 3+ comorbidities: Anemia, DM, HTN, GERD, hiatal hernia, HLD, R ankle fusion 2022, R reverse TSA 2024  are also affecting patient's functional outcome.  REHAB POTENTIAL: Good  CLINICAL DECISION MAKING: Evolving/moderate complexity  EVALUATION COMPLEXITY: Moderate  PLAN:  PT FREQUENCY: 1-2x/week  PT DURATION: 6 weeks  PLANNED INTERVENTIONS: 97164- PT Re-evaluation, 97110-Therapeutic exercises, 97530- Therapeutic activity, [id number]- Neuromuscular re-education, 97535- Self Care, [id number]- Manual therapy, [id number]- Gait training, [phone]- Canalith repositioning, [id number]- Electrical stimulation (unattended), [phone]- Electrical stimulation (manual), Patient/Family education, Balance training, Stair training, Taping, Dry Needling, Joint mobilization, Spinal mobilization, Vestibular training, DME instructions, Cryotherapy, and Moist heat  PLAN FOR NEXT SESSION:  Discuss POC and discuss possible discharge.  Review any updates to HEP.  Balance/gait on inclines   Thaddeus Filippo, [place], DPT 05/07/23 11:03 AM  [facility] at [facility] [address] [place], [address] [place], [place] [address] Phone # [phone] Fax # [phone]

## 2023-05-07 ENCOUNTER — Ambulatory Visit: Admitting: Physical Therapy

## 2023-05-07 ENCOUNTER — Encounter: Payer: Self-pay | Admitting: Physical Therapy

## 2023-05-07 DIAGNOSIS — R2689 Other abnormalities of gait and mobility: Secondary | ICD-10-CM

## 2023-05-07 DIAGNOSIS — M6281 Muscle weakness (generalized): Secondary | ICD-10-CM | POA: Diagnosis not present

## 2023-05-07 DIAGNOSIS — R2681 Unsteadiness on feet: Secondary | ICD-10-CM | POA: Diagnosis not present

## 2023-05-08 DIAGNOSIS — Z4789 Encounter for other orthopedic aftercare: Secondary | ICD-10-CM | POA: Diagnosis not present

## 2023-05-09 ENCOUNTER — Ambulatory Visit

## 2023-05-15 ENCOUNTER — Ambulatory Visit

## 2023-05-24 ENCOUNTER — Other Ambulatory Visit: Payer: Self-pay | Admitting: Internal Medicine

## 2023-05-24 DIAGNOSIS — K080 Exfoliation of teeth due to systemic causes: Secondary | ICD-10-CM | POA: Diagnosis not present

## 2023-05-24 DIAGNOSIS — E119 Type 2 diabetes mellitus without complications: Secondary | ICD-10-CM | POA: Diagnosis not present

## 2023-05-25 ENCOUNTER — Other Ambulatory Visit: Payer: Self-pay

## 2023-06-11 ENCOUNTER — Ambulatory Visit: Payer: Self-pay

## 2023-06-11 NOTE — Telephone Encounter (Signed)
 Copied from CRM [id number]. Topic: Clinical - Red Word Triage >> Jun 11, 2023  9:55 AM Magdalene [employer] wrote: Red Word that prompted transfer to Nurse Triage: fluttering heart beats, swelling in ankles.  Chief Complaint: heart fluttering, ankle swelling Symptoms: see above Frequency: comes and goes Pertinent Negatives: Patient denies fever, cp, sob, dizziness Disposition: [] ED /[] Urgent Care (no appt availability in office) / [x] Appointment(In office/virtual)/ []  [place] [facility]/ [] Home Care/ [] Refused Recommended Disposition /[] [place] [facility]/ []  Follow-up with PCP Additional Notes: per protocol apt made; care advice given, denies questions; instructed to go to ER if becomes worse.   Reason for Disposition  Heart rhythm alert (e.g., "you have irregular heartbeat") from personal wearable device (e.g., Apple Watch)  Answer Assessment - Initial Assessment Questions 1. DESCRIPTION: "Please describe your heart rate or heartbeat that you are having" (e.g., fast/slow, regular/irregular, skipped or extra beats, "palpitations")     Fluttering heart beats and swelling in ankles 2. ONSET: "When did it start?" (Minutes, hours or days)      A few weeks ago 3. DURATION: "How long does it last" (e.g., seconds, minutes, hours)     seconds 4. PATTERN "Does it come and go, or has it been constant since it started?"  "Does it get worse with exertion?"   "Are you feeling it now?"     Comes and goes 5. TAP: "Using your hand, can you tap out what you are feeling on a chair or table in front of you, so that I can hear?" (Note: not all patients can do this)       No having it now 6. HEART RATE: "Can you tell me your heart rate?" "How many beats in 15 seconds?"  (Note: not all patients can do this)       no 7. RECURRENT SYMPTOM: "Have you ever had this before?" If Yes, ask: "When was the last time?" and "What happened that time?"      randomly 8. CAUSE: "What do you think is causing the palpitations?"      na 9. CARDIAC HISTORY: "Do you have any history of heart disease?" (e.g., heart attack, angina, bypass surgery, angioplasty, arrhythmia)      Htn, dm,  10. OTHER SYMPTOMS: "Do you have any other symptoms?" (e.g., dizziness, chest pain, sweating, difficulty breathing)       denies 11. PREGNANCY: "Is there any chance you are pregnant?" "When was your last menstrual period?"       na  Protocols used: Heart Rate and Heartbeat Questions-A-AH

## 2023-06-13 ENCOUNTER — Encounter: Payer: Self-pay | Admitting: Internal Medicine

## 2023-06-13 ENCOUNTER — Ambulatory Visit (INDEPENDENT_AMBULATORY_CARE_PROVIDER_SITE_OTHER): Admitting: Internal Medicine

## 2023-06-13 VITALS — BP 106/80 | HR 72 | Temp 98.2°F | Ht 66.5 in | Wt 322.6 lb

## 2023-06-13 DIAGNOSIS — Z7985 Long-term (current) use of injectable non-insulin antidiabetic drugs: Secondary | ICD-10-CM

## 2023-06-13 DIAGNOSIS — R0600 Dyspnea, unspecified: Secondary | ICD-10-CM

## 2023-06-13 DIAGNOSIS — N1831 Chronic kidney disease, stage 3a: Secondary | ICD-10-CM | POA: Diagnosis not present

## 2023-06-13 DIAGNOSIS — E1165 Type 2 diabetes mellitus with hyperglycemia: Secondary | ICD-10-CM

## 2023-06-13 DIAGNOSIS — I10 Essential (primary) hypertension: Secondary | ICD-10-CM

## 2023-06-13 DIAGNOSIS — R002 Palpitations: Secondary | ICD-10-CM | POA: Diagnosis not present

## 2023-06-13 LAB — HEPATIC FUNCTION PANEL
ALT: 14 U/L (ref 0–35)
AST: 19 U/L (ref 0–37)
Albumin: 4 g/dL (ref 3.5–5.2)
Alkaline Phosphatase: 76 U/L (ref 39–117)
Bilirubin, Direct: 0.2 mg/dL (ref 0.0–0.3)
Total Bilirubin: 0.9 mg/dL (ref 0.2–1.2)
Total Protein: 7.4 g/dL (ref 6.0–8.3)

## 2023-06-13 LAB — CBC WITH DIFFERENTIAL/PLATELET
Basophils Absolute: 0 10*3/uL (ref 0.0–0.1)
Basophils Relative: 0.6 % (ref 0.0–3.0)
Eosinophils Absolute: 0.2 10*3/uL (ref 0.0–0.7)
Eosinophils Relative: 2.7 % (ref 0.0–5.0)
HCT: 42.5 % (ref 36.0–46.0)
Hemoglobin: 14.2 g/dL (ref 12.0–15.0)
Lymphocytes Relative: 22 % (ref 12.0–46.0)
Lymphs Abs: 1.3 10*3/uL (ref 0.7–4.0)
MCHC: 33.3 g/dL (ref 30.0–36.0)
MCV: 93 fl (ref 78.0–100.0)
Monocytes Absolute: 0.5 10*3/uL (ref 0.1–1.0)
Monocytes Relative: 7.7 % (ref 3.0–12.0)
Neutro Abs: 4.1 10*3/uL (ref 1.4–7.7)
Neutrophils Relative %: 67 % (ref 43.0–77.0)
Platelets: 264 10*3/uL (ref 150.0–400.0)
RBC: 4.57 Mil/uL (ref 3.87–5.11)
RDW: 14.7 % (ref 11.5–15.5)
WBC: 6.1 10*3/uL (ref 4.0–10.5)

## 2023-06-13 LAB — LIPID PANEL
Cholesterol: 143 mg/dL (ref 0–200)
HDL: 44.4 mg/dL (ref >39.00)
LDL Cholesterol: 78 mg/dL (ref 0–99)
NonHDL: 98.76
Total CHOL/HDL Ratio: 3
Triglycerides: 103 mg/dL (ref 0.0–149.0)
VLDL: 20.6 mg/dL (ref 0.0–40.0)

## 2023-06-13 LAB — BASIC METABOLIC PANEL WITH GFR
BUN: 30 mg/dL — ABNORMAL HIGH (ref 6–23)
CO2: 29 meq/L (ref 19–32)
Calcium: 9.5 mg/dL (ref 8.4–10.5)
Chloride: 104 meq/L (ref 96–112)
Creatinine, Ser: 1.21 mg/dL — ABNORMAL HIGH (ref 0.40–1.20)
GFR: 46.24 mL/min — ABNORMAL LOW (ref >60.00)
Glucose, Bld: 98 mg/dL (ref 70–99)
Potassium: 4.2 meq/L (ref 3.5–5.1)
Sodium: 139 meq/L (ref 135–145)

## 2023-06-13 LAB — BRAIN NATRIURETIC PEPTIDE: Pro B Natriuretic peptide (BNP): 38 pg/mL (ref 0.0–100.0)

## 2023-06-13 LAB — HEMOGLOBIN A1C: Hgb A1c MFr Bld: 5.1 % (ref 4.6–6.5)

## 2023-06-13 MED ORDER — HYDROCHLOROTHIAZIDE 25 MG PO TABS: 25.0000 mg | ORAL_TABLET | Freq: Every day | ORAL | 3 refills | Status: AC

## 2023-06-13 NOTE — Progress Notes (Signed)
 Patient ID: Jodi Keller, female   DOB: 1956-01-24, 68 y.o.   MRN: [id number]        Chief Complaint: follow up palpitations, leg swelling, dm, htn, ckd3a       HPI:  Jodi Keller is a 68 y.o. female here with c/o recent new onset intermittent rapid palpitions that bother her as is noticeable, assoc with sob and pressure like sensation, but no radiation, n/v, dizziness or syncope.  Has also had mild increase over baseline chronic ankle edema, no longer doing as well with the hct 12.5,   Pt denies other chest pain, wheezing, orthopnea, PND.   Pt denies polydipsia, polyuria, or new focal neuro s/s.    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 Formatting of this note might be different from the original. Qualifier: Diagnosis of By: Foy Imam CMA, Nettie Barb Last Assessment & Plan: Formatting of this note might be different from the original. stable overall by history and exam, recent data reviewed with pt, and pt to continue medical treatment as before,  to f/u any worsening sympto   GERD (gastroesophageal reflux disease)    History of hiatal hernia    Hyperlipidemia 02/25/2011   Hypertension    Morbid obesity (HCC)    Sleep apnea    wears cpap   Uterine prolapse    Past Surgical History:  Procedure Laterality Date   ANKLE FUSION  Right 2022   BALLOON DILATION N/A 12/29/2019   Procedure: BALLOON DILATION;  Surgeon: Tobin Forts, MD;  Location: [facility];  Service: Endoscopy;  Laterality: N/A;   CESAREAN SECTION     COLONOSCOPY WITH PROPOFOL  N/A 12/29/2019   Procedure: COLONOSCOPY WITH PROPOFOL ;  Surgeon: Tobin Forts, MD;  Location: [facility];  Service: Endoscopy;  Laterality: N/A;   ESOPHAGOGASTRODUODENOSCOPY (EGD) WITH PROPOFOL  N/A 12/29/2019   Procedure: ESOPHAGOGASTRODUODENOSCOPY (EGD) WITH PROPOFOL ;  Surgeon: Tobin Forts, MD;  Location: [facility];  Service: Endoscopy;  Laterality: N/A;   EYE SURGERY     cataract surgery per left eye    FOOT SURGERY     HYSTEROSCOPY WITH D & C N/A 05/04/2022   Procedure: DILATATION AND CURETTAGE /HYSTEROSCOPY;  Surgeon: Ona Bidding, MD;  Location: [facility];  Service: Gynecology;  Laterality: N/A;   INTRAUTERINE DEVICE (IUD) INSERTION N/A 05/04/2022   Procedure: INTRAUTERINE DEVICE (IUD) INSERTION;  Surgeon: Ona Bidding, MD;  Location: [facility];  Service: Gynecology;  Laterality: N/A;   IUD REMOVAL N/A 05/04/2022   Procedure: INTRAUTERINE DEVICE (IUD) REMOVAL;  Surgeon: Ona Bidding, MD;  Location: [facility] ;  Service: Gynecology;  Laterality: N/A;   POLYPECTOMY N/A 05/04/2022   Procedure: POLYPECTOMY;  Surgeon: Ona Bidding, MD;  Location: [facility];  Service: Gynecology;  Laterality: N/A;   REVERSE SHOULDER ARTHROPLASTY Right 02/10/2022   Procedure: REVERSE SHOULDER ARTHROPLASTY;  Surgeon: Winston Hawking, MD;  Location: [facility];  Service: Orthopedics;  Laterality: Right;  120 min choice and interscalene block    reports that she has never smoked. She has never used smokeless tobacco. She reports that she does not currently use alcohol. She reports that she does not use drugs. family history includes Cancer in her father, sister, and sister; Dementia in her mother; Diabetes in her maternal  grandmother; Thyroid  disease in an other family member. No Known Allergies Current Outpatient Medications on File Prior to Visit  Medication Sig Dispense Refill   Ascorbic Acid  (VITAMIN C  PO) Take 1 tablet by mouth at bedtime.     aspirin  81 MG chewable tablet Chew 1 tablet (81 mg total) by mouth daily. (Patient taking differently: Chew 81 mg by mouth at bedtime.) 30 tablet 0   atorvastatin  (LIPITOR) 10 MG tablet TAKE 1 TABLET (10 MG TOTAL) BY MOUTH DAILY. ANNUAL APPT IS DUE MUST SEE PROVIDER FOR FUTURE REFILLS 90 tablet 3   benazepril  (LOTENSIN ) 20 MG tablet TAKE 1 TABLET (20 MG TOTAL) BY MOUTH DAILY. ANNUAL APPT IS DUE MUST SEE PROVIDER FOR FUTURE REFILLS 90 tablet 3   CALCIUM  PO Take 1 tablet by mouth at bedtime.     cholecalciferol (VITAMIN D ) 1000 units tablet Take 1 tablet (1,000 Units total) by mouth daily as needed. Often forgets to take and doesn't know strength 30 tablet 3   citalopram  (CELEXA ) 20 MG tablet Take 1 tablet (20 mg total) by mouth daily. 90 tablet 3   clotrimazole -betamethasone  (LOTRISONE ) cream USE AS DIRECTED TWICE DAILY AS NEEDED (Patient taking differently: Apply 1 application  topically daily as needed (Rash).) 15 g 3   diclofenac  (VOLTAREN ) 75 MG EC tablet Take 75 mg by mouth daily.     HYDROcodone -acetaminophen  (NORCO) 5-325 MG tablet Take 1-2 tablets by mouth every 6 (six) hours as needed for moderate pain. 30 tablet 0   Multiple Vitamins-Minerals (MULTIVITAMIN & MINERAL PO) Take 1 tablet by mouth at bedtime.      NON FORMULARY Pt uses a cpap nightly     nystatin  powder Apply 1 Application topically daily as needed (irritation). 60 g 5   omeprazole  (PRILOSEC) 20 MG capsule Take 1 capsule (20 mg total) by mouth 2 (two) times daily before a meal. (Patient taking differently: Take 20 mg by mouth daily.) 1 capsule 0   pioglitazone  (ACTOS ) 30 MG tablet Take 1 tablet by mouth once daily 90 tablet 0   tirzepatide  (MOUNJARO ) 7.5 MG/0.5ML Pen Inject 7.5 mg into the skin once  a week. 6 mL 3   No current facility-administered medications on file prior to visit.        ROS:  All others reviewed and negative.  Objective        PE:  BP 106/80 (BP Location: Left Wrist, Patient Position: Sitting, Cuff Size: Small)   Pulse 72   Temp 98.2 F (36.8 C) (Oral)   Ht 5' 6.5" (1.689 m)   Wt (!) 322 lb 9.6 oz (146.3 kg)   LMP 01/12/2011   SpO2 97%   BMI 51.29 kg/m                 Constitutional: Pt appears in NAD               HENT: Head: NCAT.  Right Ear: External ear normal.                 Left Ear: External ear normal.                Eyes: . Pupils are equal, round, and reactive to light. Conjunctivae and EOM are normal               Nose: without d/c or deformity               Neck: Neck supple. Gross normal ROM               Cardiovascular: Normal rate and regular rhythm.                 Pulmonary/Chest: Effort normal and breath sounds without rales or wheezing.                Abd:  Soft, NT, ND, + BS, no organomegaly               Neurological: Pt is alert. At baseline orientation, motor grossly intact               Skin: Skin is warm. No rashes, no other new lesions, LE edema - 1+ bilateral               Psychiatric: Pt behavior is normal without agitation   Micro: none  Cardiac tracings I have personally interpreted today:  ECG - sinus with PVCs 74  Pertinent Radiological findings (summarize): none   Lab Results  Component Value Date   WBC 6.1 06/13/2023   HGB 14.2 06/13/2023   HCT 42.5 06/13/2023   PLT 264.0 06/13/2023   GLUCOSE 98 06/13/2023   CHOL 143 06/13/2023   TRIG 103.0 06/13/2023   HDL 44.40 06/13/2023   LDLCALC 78 06/13/2023   ALT 14 06/13/2023   AST 19 06/13/2023   NA 139 06/13/2023   K 4.2 06/13/2023   CL 104 06/13/2023   CREATININE 1.21 (H) 06/13/2023   BUN 30 (H) 06/13/2023   CO2 29 06/13/2023   TSH 2.73 09/21/2022   HGBA1C 5.1 06/13/2023   MICROALBUR 1.1 09/21/2022   Assessment/Plan:  ZORAIDA HAVRILLA is  a 68 y.o. White or Caucasian [1] female with  has a past medical history of Anemia, Bilateral carpal tunnel syndrome (12/16/2010), Degenerative joint disease of ankle, left (12/16/2010), Diabetes mellitus without complication (HCC), Essential hypertension (11/13/2006), GERD (gastroesophageal reflux disease), History of hiatal hernia, Hyperlipidemia (02/25/2011), Hypertension, Morbid obesity (HCC), Sleep apnea, and Uterine prolapse.  CKD (chronic kidney disease) stage 3, GFR 30-59 ml/min (HCC) Lab Results  Component Value Date   CREATININE 1.21 (H) 06/13/2023   Stable overall, cont to avoid nephrotoxins  Diabetes (HCC) Lab Results  Component Value Date   HGBA1C 5.1 06/13/2023   Stable, pt to continue current medical treatment actos  30 mg every day, mounjaro  7.5 mg weekly   HTN (hypertension) BP Readings from Last 3 Encounters:  06/13/23 106/80  03/23/23 132/78  01/04/23 100/72   Stable, pt to continue medical treatment   Palpitations With high suspicion for arrythmia, ECG with PVCs but can't r/o paf or other - for cardiac monitor  Dyspnea Pt is supermorbid obese with deconditioning, but also for Echo, bnp and labs as ordered; also for increased hct 25 mg every day for symptoms and worsening leg swelling with wt increase  Followup: Return in about 3 months (around 09/13/2023).  Rosalia Colonel, MD 06/14/2023 6:12 AM  [facility] - [facility]

## 2023-06-13 NOTE — Patient Instructions (Addendum)
 Your EKG was done today  Ok to increase the HCT 25 mg qd  Please continue all other medications as before, and refills have been done if requested.  Please have the pharmacy call with any other refills you may need.  Please keep your appointments with your specialists as you may have planned  You will be contacted regarding the referral for: Echocardiogram, and Heart monitor  Please go to the LAB at the blood drawing area for the tests to be done  You will be contacted by phone if any changes need to be made immediately.  Otherwise, you will receive a letter about your results with an explanation, but please check with MyChart first.  Please make an Appointment to return in 3 months, or sooner if needed

## 2023-06-14 ENCOUNTER — Other Ambulatory Visit: Payer: Self-pay | Admitting: Internal Medicine

## 2023-06-14 ENCOUNTER — Ambulatory Visit (HOSPITAL_BASED_OUTPATIENT_CLINIC_OR_DEPARTMENT_OTHER)
Admission: RE | Admit: 2023-06-14 | Discharge: 2023-06-14 | Disposition: A | Discharge status: Home / Self Care | Source: Ambulatory Visit | Attending: Internal Medicine | Admitting: Internal Medicine

## 2023-06-14 ENCOUNTER — Ambulatory Visit (HOSPITAL_BASED_OUTPATIENT_CLINIC_OR_DEPARTMENT_OTHER): Admission: RE | Admit: 2023-06-14 | Source: Ambulatory Visit

## 2023-06-14 ENCOUNTER — Ambulatory Visit: Payer: Self-pay | Admitting: Internal Medicine

## 2023-06-14 ENCOUNTER — Encounter: Payer: Self-pay | Admitting: Internal Medicine

## 2023-06-14 DIAGNOSIS — R7989 Other specified abnormal findings of blood chemistry: Secondary | ICD-10-CM | POA: Insufficient documentation

## 2023-06-14 DIAGNOSIS — R002 Palpitations: Secondary | ICD-10-CM | POA: Insufficient documentation

## 2023-06-14 DIAGNOSIS — I517 Cardiomegaly: Secondary | ICD-10-CM | POA: Diagnosis not present

## 2023-06-14 DIAGNOSIS — R918 Other nonspecific abnormal finding of lung field: Secondary | ICD-10-CM | POA: Diagnosis not present

## 2023-06-14 DIAGNOSIS — E049 Nontoxic goiter, unspecified: Secondary | ICD-10-CM

## 2023-06-14 DIAGNOSIS — R0600 Dyspnea, unspecified: Secondary | ICD-10-CM | POA: Insufficient documentation

## 2023-06-14 LAB — D-DIMER, QUANTITATIVE: D-Dimer, Quant: 1.21 ug{FEU}/mL — ABNORMAL HIGH (ref <0.50)

## 2023-06-14 MED ORDER — IOHEXOL 350 MG/ML SOLN
100.0000 mL | Freq: Once | INTRAVENOUS | Status: AC | PRN
  Administered 2023-06-14: 100 mL via INTRAVENOUS

## 2023-06-14 MED ORDER — LEVOFLOXACIN 500 MG PO TABS: 500.0000 mg | ORAL_TABLET | Freq: Every day | ORAL | 0 refills | Status: DC

## 2023-06-14 NOTE — Assessment & Plan Note (Signed)
 Lab Results  Component Value Date   CREATININE 1.21 (H) 06/13/2023   Stable overall, cont to avoid nephrotoxins

## 2023-06-14 NOTE — Assessment & Plan Note (Addendum)
 With high suspicion for arrythmia, ECG with PVCs but can't r/o paf or other - for cardiac monitor

## 2023-06-14 NOTE — Assessment & Plan Note (Signed)
 Lab Results  Component Value Date   HGBA1C 5.1 06/13/2023   Stable, pt to continue current medical treatment actos  30 mg every day, mounjaro  7.5 mg weekly

## 2023-06-14 NOTE — Assessment & Plan Note (Addendum)
 Pt is supermorbid obese with deconditioning, but also for Echo, bnp and labs as ordered; also for increased hct 25 mg every day for symptoms and worsening leg swelling with wt increase

## 2023-06-14 NOTE — Assessment & Plan Note (Signed)
 BP Readings from Last 3 Encounters:  06/13/23 106/80  03/23/23 132/78  01/04/23 100/72   Stable, pt to continue medical treatment

## 2023-06-15 ENCOUNTER — Ambulatory Visit
Admission: RE | Admit: 2023-06-15 | Discharge: 2023-06-15 | Disposition: A | Discharge status: Home / Self Care | Source: Ambulatory Visit | Attending: Internal Medicine | Admitting: Internal Medicine

## 2023-06-15 DIAGNOSIS — E049 Nontoxic goiter, unspecified: Secondary | ICD-10-CM

## 2023-06-15 DIAGNOSIS — E042 Nontoxic multinodular goiter: Secondary | ICD-10-CM | POA: Diagnosis not present

## 2023-06-18 ENCOUNTER — Other Ambulatory Visit: Payer: Self-pay | Admitting: Internal Medicine

## 2023-06-18 ENCOUNTER — Ambulatory Visit: Payer: Self-pay | Admitting: Internal Medicine

## 2023-06-18 DIAGNOSIS — E041 Nontoxic single thyroid nodule: Secondary | ICD-10-CM

## 2023-06-20 DIAGNOSIS — N1831 Chronic kidney disease, stage 3a: Secondary | ICD-10-CM | POA: Diagnosis not present

## 2023-07-06 ENCOUNTER — Other Ambulatory Visit (HOSPITAL_COMMUNITY)
Admission: RE | Admit: 2023-07-06 | Discharge: 2023-07-06 | Disposition: A | Discharge status: Home / Self Care | Source: Ambulatory Visit | Attending: Internal Medicine | Admitting: Internal Medicine

## 2023-07-06 ENCOUNTER — Other Ambulatory Visit: Payer: Self-pay | Admitting: Internal Medicine

## 2023-07-06 ENCOUNTER — Ambulatory Visit
Admission: RE | Admit: 2023-07-06 | Discharge: 2023-07-06 | Disposition: A | Discharge status: Home / Self Care | Source: Ambulatory Visit | Attending: Internal Medicine | Admitting: Internal Medicine

## 2023-07-06 DIAGNOSIS — E041 Nontoxic single thyroid nodule: Secondary | ICD-10-CM

## 2023-07-10 ENCOUNTER — Ambulatory Visit: Payer: Self-pay | Admitting: Internal Medicine

## 2023-07-10 LAB — CYTOLOGY - NON PAP

## 2023-07-11 ENCOUNTER — Inpatient Hospital Stay: Admission: RE | Admit: 2023-07-11 | Source: Ambulatory Visit

## 2023-07-18 DIAGNOSIS — Z6841 Body Mass Index (BMI) 40.0 and over, adult: Secondary | ICD-10-CM | POA: Diagnosis not present

## 2023-07-18 DIAGNOSIS — E66813 Obesity, class 3: Secondary | ICD-10-CM | POA: Diagnosis not present

## 2023-07-18 DIAGNOSIS — G4733 Obstructive sleep apnea (adult) (pediatric): Secondary | ICD-10-CM | POA: Diagnosis not present

## 2023-07-24 DIAGNOSIS — E119 Type 2 diabetes mellitus without complications: Secondary | ICD-10-CM | POA: Diagnosis not present

## 2023-07-26 DIAGNOSIS — K080 Exfoliation of teeth due to systemic causes: Secondary | ICD-10-CM | POA: Diagnosis not present

## 2023-08-03 ENCOUNTER — Ambulatory Visit (HOSPITAL_COMMUNITY)
Admission: RE | Admit: 2023-08-03 | Discharge: 2023-08-03 | Disposition: A | Discharge status: Home / Self Care | Source: Ambulatory Visit | Attending: Internal Medicine | Admitting: Internal Medicine

## 2023-08-03 DIAGNOSIS — R0600 Dyspnea, unspecified: Secondary | ICD-10-CM | POA: Diagnosis not present

## 2023-08-03 DIAGNOSIS — R002 Palpitations: Secondary | ICD-10-CM | POA: Insufficient documentation

## 2023-08-04 LAB — ECHOCARDIOGRAM COMPLETE
AR max vel: 2.68 cm2
AV Peak grad: 17.5 mmHg
Ao pk vel: 2.09 m/s
Area-P 1/2: 2.96 cm2
S' Lateral: 3.1 cm

## 2023-08-11 DIAGNOSIS — G4733 Obstructive sleep apnea (adult) (pediatric): Secondary | ICD-10-CM | POA: Diagnosis not present

## 2023-08-24 ENCOUNTER — Other Ambulatory Visit: Payer: Self-pay | Admitting: Internal Medicine

## 2023-08-24 DIAGNOSIS — E119 Type 2 diabetes mellitus without complications: Secondary | ICD-10-CM | POA: Diagnosis not present

## 2023-09-06 ENCOUNTER — Other Ambulatory Visit: Payer: Self-pay | Admitting: Internal Medicine

## 2023-09-11 DIAGNOSIS — G4733 Obstructive sleep apnea (adult) (pediatric): Secondary | ICD-10-CM | POA: Diagnosis not present

## 2023-09-20 ENCOUNTER — Ambulatory Visit: Payer: Medicare Other | Admitting: Internal Medicine

## 2023-09-24 DIAGNOSIS — E119 Type 2 diabetes mellitus without complications: Secondary | ICD-10-CM | POA: Diagnosis not present

## 2023-09-25 ENCOUNTER — Ambulatory Visit: Admitting: Internal Medicine

## 2023-09-25 ENCOUNTER — Encounter: Payer: Self-pay | Admitting: Internal Medicine

## 2023-09-25 VITALS — BP 112/72 | HR 65 | Temp 98.0°F | Ht 66.5 in | Wt 311.8 lb

## 2023-09-25 DIAGNOSIS — I10 Essential (primary) hypertension: Secondary | ICD-10-CM | POA: Diagnosis not present

## 2023-09-25 DIAGNOSIS — N1831 Chronic kidney disease, stage 3a: Secondary | ICD-10-CM

## 2023-09-25 DIAGNOSIS — Z7984 Long term (current) use of oral hypoglycemic drugs: Secondary | ICD-10-CM

## 2023-09-25 DIAGNOSIS — E7800 Pure hypercholesterolemia, unspecified: Secondary | ICD-10-CM

## 2023-09-25 DIAGNOSIS — E1165 Type 2 diabetes mellitus with hyperglycemia: Secondary | ICD-10-CM

## 2023-09-25 DIAGNOSIS — F418 Other specified anxiety disorders: Secondary | ICD-10-CM

## 2023-09-25 MED ORDER — DICLOFENAC SODIUM 75 MG PO TBEC: 75.0000 mg | DELAYED_RELEASE_TABLET | Freq: Every day | ORAL | 1 refills | Status: AC

## 2023-09-25 NOTE — Assessment & Plan Note (Signed)
 Lab Results  Component Value Date   LDLCALC 78 06/13/2023   Uncontrolled, pt to continue current statin lipitor 10 mg every day, declines change but for f/u lipids

## 2023-09-25 NOTE — Assessment & Plan Note (Signed)
 BP Readings from Last 3 Encounters:  09/25/23 112/72  06/13/23 106/80  03/23/23 132/78   Stable, pt to continue medical treatment lotensin  20 mg every day, hct 25 mg qd

## 2023-09-25 NOTE — Progress Notes (Signed)
 Patient ID: CHASYA ZENZ, female   DOB: [birth year], 68 y.o.   MRN: [id number]        Chief Complaint: follow up dm, htn, hld, ckd3a, anxiety depression       HPI:  Jodi Keller is a 68 y.o. female here overall doing ok, Pt denies chest pain, increased sob or doe, wheezing, orthopnea, PND, increased LE swelling, palpitations, dizziness or syncope.   Pt denies polydipsia, polyuria, or new focal neuro s/s.    Pt denies fever, wt loss, night sweats, loss of appetite, or other constitutional symptoms   Lost wt with better diet and mounjaro  but wt loss has leveled out.  .  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 Formatting of this note might be different from the original. Qualifier: Diagnosis of By: Sherron CMA, Steen Last Assessment & Plan: Formatting of this note might be different from the original. stable overall by history and exam, recent data reviewed with pt, and pt to continue medical treatment as before,  to f/u any worsening sympto   GERD (gastroesophageal reflux disease)    History of hiatal hernia    Hyperlipidemia 02/25/2011   Hypertension    Morbid obesity (HCC)    Sleep apnea    wears cpap   Uterine prolapse    Past Surgical History:  Procedure Laterality Date   ANKLE FUSION Right 2022   BALLOON DILATION N/A 12/29/2019   Procedure: BALLOON DILATION;  Surgeon: Abran Norleen SAILOR, MD;  Location: [facility];  Service: Endoscopy;  Laterality: N/A;   CESAREAN SECTION     COLONOSCOPY WITH PROPOFOL  N/A 12/29/2019   Procedure: COLONOSCOPY WITH PROPOFOL ;  Surgeon: Abran Norleen SAILOR, MD;  Location: [facility];  Service: Endoscopy;  Laterality: N/A;   ESOPHAGOGASTRODUODENOSCOPY (EGD) WITH PROPOFOL  N/A 12/29/2019   Procedure: ESOPHAGOGASTRODUODENOSCOPY (EGD) WITH PROPOFOL ;  Surgeon: Abran Norleen SAILOR, MD;  Location: [facility];  Service: Endoscopy;  Laterality: N/A;   EYE SURGERY     cataract surgery per left eye    FOOT SURGERY     HYSTEROSCOPY WITH D & C N/A 05/04/2022   Procedure: DILATATION AND CURETTAGE /HYSTEROSCOPY;  Surgeon: Darcel Pool, MD;  Location: [facility];  Service: Gynecology;  Laterality: N/A;   INTRAUTERINE DEVICE (IUD) INSERTION N/A 05/04/2022   Procedure: INTRAUTERINE DEVICE (IUD) INSERTION;  Surgeon: Darcel Pool, MD;  Location: [facility];  Service: Gynecology;  Laterality: N/A;   IUD REMOVAL N/A 05/04/2022   Procedure: INTRAUTERINE DEVICE (IUD) REMOVAL;  Surgeon: Darcel Pool, MD;  Location: [facility];  Service: Gynecology;  Laterality: N/A;   POLYPECTOMY N/A 05/04/2022   Procedure: POLYPECTOMY;  Surgeon: Darcel Pool, MD;  Location: [facility];  Service: Gynecology;  Laterality: N/A;   REVERSE SHOULDER ARTHROPLASTY Right 02/10/2022   Procedure: REVERSE SHOULDER ARTHROPLASTY;  Surgeon: Kay Kemps, MD;  Location: [facility];  Service: Orthopedics;  Laterality: Right;  120 min choice and interscalene block    reports that she has never smoked. She has never used smokeless tobacco. She reports that she does not currently use alcohol. She reports that she does not use drugs. family history includes Cancer in her father, sister, and sister; Dementia in her mother; Diabetes in her maternal grandmother; Thyroid  disease in an other family member. No Known Allergies Current Outpatient  Medications on File Prior to Visit  Medication Sig Dispense Refill   Ascorbic Acid  (VITAMIN C  PO) Take 1 tablet by mouth at bedtime.     aspirin  81 MG chewable tablet Chew 1 tablet (81 mg total) by mouth daily. (Patient taking differently: Chew 81 mg by mouth at bedtime.) 30 tablet 0   atorvastatin  (LIPITOR) 10 MG tablet TAKE 1 TABLET (10 MG TOTAL) BY MOUTH DAILY. ANNUAL APPT IS DUE MUST SEE PROVIDER FOR FUTURE REFILLS 90 tablet 3   benazepril  (LOTENSIN ) 20 MG tablet TAKE 1 TABLET (20 MG TOTAL) BY MOUTH DAILY. ANNUAL APPT IS DUE MUST SEE PROVIDER FOR FUTURE REFILLS 90 tablet 3   CALCIUM  PO Take 1 tablet by mouth at bedtime.     cholecalciferol (VITAMIN D ) 1000 units tablet Take 1 tablet (1,000 Units total) by mouth daily as needed. Often forgets to take and doesn't know strength 30 tablet 3   clotrimazole -betamethasone  (LOTRISONE ) cream USE AS DIRECTED TWICE DAILY AS NEEDED (Patient taking differently: Apply 1 application  topically daily as needed (Rash).) 15 g 3   hydrochlorothiazide  (HYDRODIURIL ) 25 MG tablet Take 1 tablet (25 mg total) by mouth daily. 90 tablet 3   Multiple Vitamins-Minerals (MULTIVITAMIN & MINERAL PO) Take 1 tablet by mouth at bedtime.      NON FORMULARY Pt uses a cpap nightly     nystatin  powder Apply 1 Application topically daily as needed (irritation). 60 g 5   omeprazole  (PRILOSEC) 20 MG capsule Take 1 capsule (20 mg total) by mouth 2 (two) times daily before a meal. (Patient taking differently: Take 20 mg by mouth daily.) 1 capsule 0   pioglitazone  (ACTOS ) 30 MG tablet Take 1 tablet by mouth once daily 90 tablet 0   tirzepatide  (MOUNJARO ) 7.5 MG/0.5ML Pen Inject 7.5 mg into the skin once a week. 6 mL 3   No current facility-administered medications on file prior to visit.        ROS:  All others reviewed and negative.  Objective        PE:  BP 112/72   Pulse 65   Temp 98 F (36.7 C)   Ht 5' 6.5 (1.689 m)   Wt (!) 311 lb 12.8 oz (141.4 kg)   LMP 01/12/2011    SpO2 98%   BMI 49.57 kg/m                 Constitutional: Pt appears in NAD               HENT: Head: NCAT.                Right Ear: External ear normal.                 Left Ear: External ear normal.                Eyes: . Pupils are equal, round, and reactive to light. Conjunctivae and EOM are normal  Nose: without d/c or deformity               Neck: Neck supple. Gross normal ROM               Cardiovascular: Normal rate and regular rhythm.                 Pulmonary/Chest: Effort normal and breath sounds without rales or wheezing.                Abd:  Soft, NT, ND, + BS, no organomegaly               Neurological: Pt is alert. At baseline orientation, motor grossly intact               Skin: Skin is warm. No rashes, no other new lesions, LE edema - trace bilateral swelling               Psychiatric: Pt behavior is normal without agitation   Micro: none  Cardiac tracings I have personally interpreted today:  none  Pertinent Radiological findings (summarize): none   Lab Results  Component Value Date   WBC 6.1 06/13/2023   HGB 14.2 06/13/2023   HCT 42.5 06/13/2023   PLT 264.0 06/13/2023   GLUCOSE 98 06/13/2023   CHOL 143 06/13/2023   TRIG 103.0 06/13/2023   HDL 44.40 06/13/2023   LDLCALC 78 06/13/2023   ALT 14 06/13/2023   AST 19 06/13/2023   NA 139 06/13/2023   K 4.2 06/13/2023   CL 104 06/13/2023   CREATININE 1.21 (H) 06/13/2023   BUN 30 (H) 06/13/2023   CO2 29 06/13/2023   TSH 2.73 09/21/2022   HGBA1C 5.1 06/13/2023   Assessment/Plan:  MARTENA EMANUELE is a 68 y.o. White or Caucasian [1] female with  has a past medical history of Anemia, Bilateral carpal tunnel syndrome (12/16/2010), Degenerative joint disease of ankle, left (12/16/2010), Diabetes mellitus without complication (HCC), Essential hypertension (11/13/2006), GERD (gastroesophageal reflux disease), History of hiatal hernia, Hyperlipidemia (02/25/2011), Hypertension, Morbid obesity (HCC), Sleep  apnea, and Uterine prolapse.  Anxious depression Overall stable, cont current celexa  20 qd  CKD (chronic kidney disease) stage 3, GFR 30-59 ml/min (HCC) Lab Results  Component Value Date   CREATININE 1.21 (H) 06/13/2023   Stable overall, cont to avoid nephrotoxins   Diabetes (HCC) Lab Results  Component Value Date   HGBA1C 5.1 06/13/2023   With uncontrolled obesity, pt to continue current medical treatment actos  30 every day, and increased mounjaro  to 10 mg weekly    HTN (hypertension) BP Readings from Last 3 Encounters:  09/25/23 112/72  06/13/23 106/80  03/23/23 132/78   Stable, pt to continue medical treatment lotensin  20 mg every day, hct 25 mg qd   Hyperlipidemia Lab Results  Component Value Date   LDLCALC 78 06/13/2023   Uncontrolled, pt to continue current statin lipitor 10 mg every day, declines change but for f/u lipids  Followup: No follow-ups on file.  Lynwood Rush, MD 09/25/2023 7:02 PM [facility]

## 2023-09-25 NOTE — Assessment & Plan Note (Signed)
 Lab Results  Component Value Date   CREATININE 1.21 (H) 06/13/2023   Stable overall, cont to avoid nephrotoxins

## 2023-09-25 NOTE — Assessment & Plan Note (Signed)
 Lab Results  Component Value Date   HGBA1C 5.1 06/13/2023   With uncontrolled obesity, pt to continue current medical treatment actos  30 every day, and increased mounjaro  to 10 mg weekly

## 2023-09-25 NOTE — Patient Instructions (Signed)
 Ok to increase the mounjaro  to 10 mg weekly  Please continue all other medications as before, and refills have been done if requested.  Please have the pharmacy call with any other refills you may need.  Please continue your efforts at being more active, low cholesterol diet, and weight control..  Please keep your appointments with your specialists as you may have planned  We can hold on lab test today  Please make an Appointment to return in 6 months, or sooner if needed

## 2023-09-25 NOTE — Assessment & Plan Note (Signed)
 Overall stable, cont current celexa  20 qd

## 2023-10-18 ENCOUNTER — Other Ambulatory Visit: Payer: Self-pay | Admitting: Internal Medicine

## 2023-10-18 DIAGNOSIS — K080 Exfoliation of teeth due to systemic causes: Secondary | ICD-10-CM | POA: Diagnosis not present

## 2023-10-22 ENCOUNTER — Other Ambulatory Visit: Payer: Self-pay

## 2023-10-22 MED ORDER — TIRZEPATIDE 7.5 MG/0.5ML ~~LOC~~ SOAJ: 7.5000 mg | SUBCUTANEOUS | 3 refills | Status: AC

## 2023-10-24 DIAGNOSIS — E119 Type 2 diabetes mellitus without complications: Secondary | ICD-10-CM | POA: Diagnosis not present

## 2023-11-11 ENCOUNTER — Other Ambulatory Visit: Payer: Self-pay | Admitting: Internal Medicine

## 2023-11-12 ENCOUNTER — Other Ambulatory Visit: Payer: Self-pay

## 2023-11-24 DIAGNOSIS — E119 Type 2 diabetes mellitus without complications: Secondary | ICD-10-CM | POA: Diagnosis not present

## 2023-12-01 ENCOUNTER — Other Ambulatory Visit: Payer: Self-pay | Admitting: Internal Medicine

## 2023-12-07 DIAGNOSIS — M5416 Radiculopathy, lumbar region: Secondary | ICD-10-CM | POA: Diagnosis not present

## 2023-12-07 DIAGNOSIS — M1612 Unilateral primary osteoarthritis, left hip: Secondary | ICD-10-CM | POA: Diagnosis not present

## 2024-01-11 DIAGNOSIS — M19072 Primary osteoarthritis, left ankle and foot: Secondary | ICD-10-CM | POA: Diagnosis not present

## 2024-02-15 ENCOUNTER — Other Ambulatory Visit

## 2024-02-15 ENCOUNTER — Other Ambulatory Visit: Payer: Self-pay | Admitting: Internal Medicine

## 2024-02-15 ENCOUNTER — Ambulatory Visit

## 2024-02-15 ENCOUNTER — Encounter: Payer: Self-pay | Admitting: Internal Medicine

## 2024-02-15 VITALS — BP 120/62 | HR 74 | Temp 98.0°F | Ht 66.5 in | Wt 320.2 lb

## 2024-02-15 DIAGNOSIS — R300 Dysuria: Secondary | ICD-10-CM

## 2024-02-15 DIAGNOSIS — Z0000 Encounter for general adult medical examination without abnormal findings: Secondary | ICD-10-CM

## 2024-02-15 LAB — URINALYSIS, ROUTINE W REFLEX MICROSCOPIC
Bilirubin Urine: NEGATIVE
Ketones, ur: NEGATIVE
Nitrite: NEGATIVE
Specific Gravity, Urine: 1.02 (ref 1.000–1.030)
Total Protein, Urine: 30 — AB
Urine Glucose: NEGATIVE
Urobilinogen, UA: 1 (ref 0.0–1.0)
pH: 6 (ref 5.0–8.0)

## 2024-02-15 NOTE — Addendum Note (Signed)
 Addended by: CLAUDENE BOBBETTE RAMAN on: 02/15/2024 04:13 PM   Modules accepted: Orders

## 2024-02-15 NOTE — Progress Notes (Signed)
 "  Chief Complaint  Patient presents with   Medicare Wellness     Subjective:   Jodi Keller is a 69 y.o. female who presents for a Medicare Annual Wellness Visit.  Visit info / Clinical Intake: Medicare Wellness Visit Type:: Subsequent Annual Wellness Visit Persons participating in visit and providing information:: patient Medicare Wellness Visit Mode:: In-person (required for WTM) Interpreter Needed?: No Pre-visit prep was completed: yes AWV questionnaire completed by patient prior to visit?: yes Date:: 02/14/24 Living arrangements:: (!) (Patient-Rptd) lives alone Patient's Overall Health Status Rating: (Patient-Rptd) good Typical amount of pain: (Patient-Rptd) some Does pain affect daily life?: (Patient-Rptd) no Are you currently prescribed opioids?: no  Dietary Habits and Nutritional Risks How many meals a day?: (Patient-Rptd) 3 Eats fruit and vegetables daily?: (Patient-Rptd) yes Most meals are obtained by: (Patient-Rptd) preparing own meals In the last 2 weeks, have you had any of the following?: none Diabetic:: (!) yes Any non-healing wounds?: no How often do you check your BS?: as needed Would you like to be referred to a Nutritionist or for Diabetic Management? : no  Functional Status Activities of Daily Living (to include ambulation/medication): Independent Ambulation: Independent with device- listed below Home Assistive Devices/Equipment: Walker (specify Type) Medication Administration: (Patient-Rptd) Independent Home Management (perform basic housework or laundry): (Patient-Rptd) Independent Manage your own finances?: (Patient-Rptd) yes Primary transportation is: (Patient-Rptd) driving Concerns about vision?: no *vision screening is required for WTM* Concerns about hearing?: no  Fall Screening Falls in the past year?: (Patient-Rptd) 0 Number of falls in past year: 0 Was there an injury with Fall?: 0 Fall Risk Category Calculator: 0 Patient Fall Risk  Level: Low Fall Risk  Fall Risk Patient at Risk for Falls Due to: Medication side effect; Impaired mobility; Impaired balance/gait Fall risk Follow up: Falls prevention discussed; Education provided; Falls evaluation completed  Home and Transportation Safety: All rugs have non-skid backing?: (!) (Patient-Rptd) no All stairs or steps have railings?: (Patient-Rptd) N/A, no stairs Grab bars in the bathtub or shower?: (Patient-Rptd) yes Have non-skid surface in bathtub or shower?: (Patient-Rptd) yes Good home lighting?: (Patient-Rptd) yes Regular seat belt use?: (Patient-Rptd) yes Hospital stays in the last year:: (Patient-Rptd) no  Cognitive Assessment Difficulty concentrating, remembering, or making decisions? : (Patient-Rptd) no Will 6CIT or Mini Cog be Completed: yes What year is it?: 0 points What month is it?: 0 points Give patient an address phrase to remember (5 components): [address] [place] About what time is it?: 0 points Count backwards from 20 to 1: 0 points Say the months of the year in reverse: 0 points Repeat the address phrase from earlier: 6 points 6 CIT Score: 6 points  Advance Directives (For Healthcare) Does Patient Have a Medical Advance Directive?: Yes Does patient want to make changes to medical advance directive?: No - Patient declined Type of Advance Directive: Healthcare Power of [place]; Living will Copy of Healthcare Power of Attorney in Chart?: No - copy requested Copy of Living Will in Chart?: No - copy requested  Reviewed/Updated  Reviewed/Updated: Reviewed All (Medical, Surgical, Family, Medications, Allergies, Care Teams, Patient Goals)    Allergies (verified) Patient has no known allergies.   Current Medications (verified) Outpatient Encounter Medications as of 02/15/2024  Medication Sig   Ascorbic Acid  (VITAMIN C  PO) Take 1 tablet by mouth at bedtime.   aspirin  81 MG chewable tablet Chew 1 tablet (81 mg total) by mouth daily.  (Patient taking differently: Chew 81 mg by mouth at bedtime.)  atorvastatin  (LIPITOR) 10 MG tablet Take 1 tablet by mouth once daily   benazepril  (LOTENSIN ) 20 MG tablet Take 1 tablet by mouth once daily   CALCIUM  PO Take 1 tablet by mouth at bedtime.   cholecalciferol (VITAMIN D ) 1000 units tablet Take 1 tablet (1,000 Units total) by mouth daily as needed. Often forgets to take and doesn't know strength   clotrimazole -betamethasone  (LOTRISONE ) cream USE AS DIRECTED TWICE DAILY AS NEEDED (Patient taking differently: Apply 1 application  topically daily as needed (Rash).)   diclofenac  (VOLTAREN ) 75 MG EC tablet Take 1 tablet (75 mg total) by mouth daily.   hydrochlorothiazide  (HYDRODIURIL ) 25 MG tablet Take 1 tablet (25 mg total) by mouth daily.   Multiple Vitamins-Minerals (MULTIVITAMIN & MINERAL PO) Take 1 tablet by mouth at bedtime.    NON FORMULARY Pt uses a cpap nightly   nystatin  powder Apply 1 Application topically daily as needed (irritation).   omeprazole  (PRILOSEC) 20 MG capsule Take 1 capsule (20 mg total) by mouth 2 (two) times daily before a meal. (Patient taking differently: Take 20 mg by mouth daily.)   pioglitazone  (ACTOS ) 30 MG tablet Take 1 tablet by mouth once daily   tirzepatide  (MOUNJARO ) 7.5 MG/0.5ML Pen Inject 7.5 mg into the skin once a week.   No facility-administered encounter medications on file as of 02/15/2024.    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 Formatting of this note might be different from the original. Qualifier: Diagnosis of By: Sherron CMA, Steen Last Assessment & Plan: Formatting of this note might be different from the original. stable overall by history and exam,  recent data reviewed with pt, and pt to continue medical treatment as before,  to f/u any worsening sympto   GERD (gastroesophageal reflux disease)    History of hiatal hernia    Hyperlipidemia 02/25/2011   Hypertension    Morbid obesity (HCC)    Sleep apnea    wears cpap   Uterine prolapse    Past Surgical History:  Procedure Laterality Date   ANKLE FUSION Right 2022   BALLOON DILATION N/A 12/29/2019   Procedure: BALLOON DILATION;  Surgeon: Abran Norleen SAILOR, MD;  Location: [facility];  Service: Endoscopy;  Laterality: N/A;   CESAREAN SECTION     COLONOSCOPY WITH PROPOFOL  N/A 12/29/2019   Procedure: COLONOSCOPY WITH PROPOFOL ;  Surgeon: Abran Norleen SAILOR, MD;  Location: [facility];  Service: Endoscopy;  Laterality: N/A;   ESOPHAGOGASTRODUODENOSCOPY (EGD) WITH PROPOFOL  N/A 12/29/2019   Procedure: ESOPHAGOGASTRODUODENOSCOPY (EGD) WITH PROPOFOL ;  Surgeon: Abran Norleen SAILOR, MD;  Location: [facility];  Service: Endoscopy;  Laterality: N/A;   EYE SURGERY     cataract surgery per left eye    FOOT SURGERY     HYSTEROSCOPY WITH D & C N/A 05/04/2022   Procedure: DILATATION AND CURETTAGE /HYSTEROSCOPY;  Surgeon: Darcel Pool, MD;  Location: [facility];  Service: Gynecology;  Laterality: N/A;   INTRAUTERINE DEVICE (IUD) INSERTION N/A 05/04/2022   Procedure: INTRAUTERINE DEVICE (IUD) INSERTION;  Surgeon: Darcel Pool, MD;  Location: [facility];  Service: Gynecology;  Laterality: N/A;   IUD REMOVAL N/A 05/04/2022   Procedure: INTRAUTERINE DEVICE (IUD) REMOVAL;  Surgeon: Darcel Pool, MD;  Location: [facility];  Service: Gynecology;  Laterality: N/A;   POLYPECTOMY N/A 05/04/2022   Procedure: POLYPECTOMY;  Surgeon: Darcel Pool, MD;  Location: [facility];  Service: Gynecology;  Laterality: N/A;   REVERSE SHOULDER ARTHROPLASTY Right 02/10/2022   Procedure: REVERSE SHOULDER ARTHROPLASTY;  Surgeon: Kay Kemps, MD;  Location: [facility];   Service: Orthopedics;  Laterality: Right;  120 min choice and interscalene block   Family History  Problem Relation Age of Onset   Dementia Mother    Cancer Father        Prostate   Cancer Sister        Multiple Myeloma   Cancer Sister        Breast   Diabetes Maternal Grandmother    Thyroid  disease Other        Multiple   Colon cancer Neg Hx    Esophageal cancer Neg Hx    Pancreatic cancer Neg Hx    Stomach cancer Neg Hx    Social History   Occupational History   Occupation: Not employed  Tobacco Use   Smoking status: Never   Smokeless tobacco: Never  Vaping Use   Vaping status: Never Used  Substance and Sexual Activity   Alcohol use: Not Currently    Comment: rare   Drug use: Never   Sexual activity: Not Currently    Birth control/protection: Other-see comments    Comment: postmenopausal   Tobacco Counseling Counseling given: Not Answered  SDOH Screenings   Food Insecurity: No Food Insecurity (02/14/2024)  Housing: Unknown (02/14/2024)  Transportation Needs: Unknown (02/14/2024)  Utilities: Not At Risk (02/15/2024)  Alcohol Screen: Low Risk (02/14/2024)  Depression (PHQ2-9): Low Risk (02/15/2024)  Financial Resource Strain: Low Risk (02/14/2024)  Physical Activity: Insufficiently Active (02/14/2024)  Social Connections: Moderately Integrated (02/14/2024)  Stress: No Stress Concern Present (02/14/2024)  Tobacco Use: Low Risk (02/15/2024)  Health Literacy: Adequate Health Literacy (02/15/2024)   See flowsheets for full screening details  Depression Screen PHQ 2 & 9 Depression Scale- Over the past 2 weeks, how often have you been bothered by any of the following problems? Little interest or pleasure in doing things: 0 Feeling down, depressed, or hopeless (PHQ Adolescent also includes...irritable): 0 PHQ-2 Total Score: 0 Trouble falling or staying asleep, or sleeping too much: 0 Feeling tired or having little energy: 0 Poor appetite or overeating (PHQ Adolescent also  includes...weight loss): 0 Feeling bad about yourself - or that you are a failure or have let yourself or your family down: 0 Trouble concentrating on things, such as reading the newspaper or watching television (PHQ Adolescent also includes...like school work): 0 Moving or speaking so slowly that other people could have noticed. Or the opposite - being so fidgety or restless that you have been moving around a lot more than usual: 0 Thoughts that you would be better off dead, or of hurting yourself in some way: 0 PHQ-9 Total Score: 0 If you checked off any problems, how difficult have these problems made it for you to do your work, take care of things at home, or get along with other people?: Not difficult at all  Depression Treatment Depression Interventions/Treatment : EYV7-0 Score <4 Follow-up Not Indicated     Goals Addressed               This Visit's Progress     maintain and be active (pt-stated)               Objective:  Today's Vitals   02/15/24 1546  BP: 120/62  Pulse: 74  Temp: 98 F (36.7 C)  TempSrc: Oral  SpO2: 96%  Weight: (!) 320 lb 3.2 oz (145.2 kg)  Height: 5' 6.5 (1.689 m)   Body mass index is 50.91 kg/m.  Hearing/Vision screen Vision Screening - Comments:: Regular eye exams Immunizations and Health Maintenance Health Maintenance  Topic Date Due   Diabetic kidney evaluation - Urine ACR  Never done   FOOT EXAM  09/21/2023   HEMOGLOBIN A1C  12/14/2023   OPHTHALMOLOGY EXAM  02/23/2024   COVID-19 Vaccine (6 - Pfizer risk 2025-26 season) 05/04/2024   Diabetic kidney evaluation - eGFR measurement  06/12/2024   Mammogram  01/01/2025   Medicare Annual Wellness (AWV)  02/14/2025   Colonoscopy  12/28/2029   DTaP/Tdap/Td (3 - Td or Tdap) 08/09/2033   Pneumococcal Vaccine: 50+ Years  Completed   Influenza Vaccine  Completed   Bone Density Scan  Completed   Hepatitis C Screening  Completed   Zoster Vaccines- Shingrix  Completed   Meningococcal B  Vaccine  Aged Out        Assessment/Plan:  This is a routine wellness examination for Jodi Keller.  Patient Care Team: Norleen Lynwood ORN, MD as PCP - Diedre Robinson Mayo, [place] as Referring Physician (Optometry)  I have personally reviewed and noted the following in the patients chart:   Medical and social history Use of alcohol, tobacco or illicit drugs  Current medications and supplements including opioid prescriptions. Functional ability and status Nutritional status Physical activity Advanced directives List of other physicians Hospitalizations, surgeries, and ER visits in previous 12 months Vitals Screenings to include cognitive, depression, and falls Referrals and appointments  No orders of the defined types were placed in this encounter.  In addition, I have reviewed and discussed with patient certain preventive protocols, quality metrics, and best practice recommendations. A written personalized care plan for preventive services as well as general preventive health recommendations were provided to patient.   Jodi FORBES Dawn, LPN   [id number]   Return in 1 year (on 02/14/2025).  After Visit Summary: (MyChart) Due to this being a telephonic visit, the after visit summary with patients personalized plan was offered to patient via MyChart   Nurse Notes: HM Addressed: Patient was complaining of burning with urination, lower back aches and incontinence. Dr. Norleen informed and he ordered an urine test.  "

## 2024-02-15 NOTE — Patient Instructions (Signed)
 Jodi Keller,  Thank you for taking the time for your Medicare Wellness Visit. I appreciate your continued commitment to your health goals. Please review the care plan we discussed, and feel free to reach out if I can assist you further.  Please note that Annual Wellness Visits do not include a physical exam. Some assessments may be limited, especially if the visit was conducted virtually. If needed, we may recommend an in-person follow-up with your provider.  Ongoing Care Seeing your primary care provider every 3 to 6 months helps us  monitor your health and provide consistent, personalized care.   Referrals If a referral was made during today's visit and you haven't received any updates within two weeks, please contact the referred provider directly to check on the status.  Recommended Screenings:  Health Maintenance  Topic Date Due   Kidney health urinalysis for diabetes  Never done   Complete foot exam   09/21/2023   Hemoglobin A1C  12/14/2023   Medicare Annual Wellness Visit  01/04/2024   Eye exam for diabetics  02/23/2024   COVID-19 Vaccine (6 - Pfizer risk 2025-26 season) 05/04/2024   Yearly kidney function blood test for diabetes  06/12/2024   Breast Cancer Screening  01/01/2025   Colon Cancer Screening  12/28/2029   DTaP/Tdap/Td vaccine (3 - Td or Tdap) 08/09/2033   Pneumococcal Vaccine for age over 82  Completed   Flu Shot  Completed   Osteoporosis screening with Bone Density Scan  Completed   Hepatitis C Screening  Completed   Zoster (Shingles) Vaccine  Completed   Meningitis B Vaccine  Aged Out       02/14/2024    1:05 PM  Advanced Directives  Does Patient Have a Medical Advance Directive? Yes  Type of Estate Agent of [place];Living will  Does patient want to make changes to medical advance directive? No - Patient declined  Copy of Healthcare Power of Attorney in Chart? No - copy requested    Vision: Annual vision screenings are recommended  for early detection of glaucoma, cataracts, and diabetic retinopathy. These exams can also reveal signs of chronic conditions such as diabetes and high blood pressure.  Dental: Annual dental screenings help detect early signs of oral cancer, gum disease, and other conditions linked to overall health, including heart disease and diabetes.  Please see the attached documents for additional preventive care recommendations.

## 2024-02-16 ENCOUNTER — Other Ambulatory Visit: Payer: Self-pay | Admitting: Internal Medicine

## 2024-02-16 ENCOUNTER — Ambulatory Visit: Payer: Self-pay | Admitting: Internal Medicine

## 2024-02-16 MED ORDER — CEPHALEXIN 500 MG PO CAPS: 500.0000 mg | ORAL_CAPSULE | Freq: Three times a day (TID) | ORAL | 0 refills | Status: AC

## 2024-02-17 LAB — URINE CULTURE

## 2024-03-28 ENCOUNTER — Ambulatory Visit: Admitting: Internal Medicine

## 2024-08-13 ENCOUNTER — Ambulatory Visit: Admitting: Family Medicine
